# Patient Record
Sex: Male | Born: 1945 | Race: White | Hispanic: No | Marital: Single | Smoking: Never smoker
Health system: Southern US, Community
[De-identification: ages and names within clinical notes are randomized; demographics above are authoritative.]

## PROBLEM LIST (undated history)

## (undated) DIAGNOSIS — B182 Chronic viral hepatitis C: Secondary | ICD-10-CM

## (undated) DIAGNOSIS — F1010 Alcohol abuse, uncomplicated: Secondary | ICD-10-CM

## (undated) DIAGNOSIS — M87051 Idiopathic aseptic necrosis of right femur: Secondary | ICD-10-CM

## (undated) DIAGNOSIS — E440 Moderate protein-calorie malnutrition: Secondary | ICD-10-CM

## (undated) DIAGNOSIS — R112 Nausea with vomiting, unspecified: Secondary | ICD-10-CM

## (undated) DIAGNOSIS — E871 Hypo-osmolality and hyponatremia: Secondary | ICD-10-CM

## (undated) DIAGNOSIS — M6250 Muscle wasting and atrophy, not elsewhere classified, unspecified site: Secondary | ICD-10-CM

## (undated) DIAGNOSIS — T7840XA Allergy, unspecified, initial encounter: Secondary | ICD-10-CM

## (undated) DIAGNOSIS — E559 Vitamin D deficiency, unspecified: Secondary | ICD-10-CM

## (undated) DIAGNOSIS — G4701 Insomnia due to medical condition: Secondary | ICD-10-CM

## (undated) DIAGNOSIS — J45909 Unspecified asthma, uncomplicated: Secondary | ICD-10-CM

## (undated) DIAGNOSIS — M545 Low back pain, unspecified: Secondary | ICD-10-CM

## (undated) DIAGNOSIS — E876 Hypokalemia: Secondary | ICD-10-CM

## (undated) DIAGNOSIS — D500 Iron deficiency anemia secondary to blood loss (chronic): Secondary | ICD-10-CM

## (undated) DIAGNOSIS — B49 Unspecified mycosis: Secondary | ICD-10-CM

## (undated) DIAGNOSIS — Z9889 Other specified postprocedural states: Secondary | ICD-10-CM

## (undated) DIAGNOSIS — F319 Bipolar disorder, unspecified: Secondary | ICD-10-CM

## (undated) DIAGNOSIS — W19XXXD Unspecified fall, subsequent encounter: Secondary | ICD-10-CM

## (undated) DIAGNOSIS — M87052 Idiopathic aseptic necrosis of left femur: Secondary | ICD-10-CM

## (undated) DIAGNOSIS — R1030 Lower abdominal pain, unspecified: Secondary | ICD-10-CM

## (undated) DIAGNOSIS — Z8616 Personal history of COVID-19: Secondary | ICD-10-CM

## (undated) DIAGNOSIS — R451 Restlessness and agitation: Secondary | ICD-10-CM

## (undated) DIAGNOSIS — K429 Umbilical hernia without obstruction or gangrene: Secondary | ICD-10-CM

## (undated) DIAGNOSIS — Z96642 Presence of left artificial hip joint: Secondary | ICD-10-CM

## (undated) DIAGNOSIS — I2510 Atherosclerotic heart disease of native coronary artery without angina pectoris: Secondary | ICD-10-CM

## (undated) DIAGNOSIS — R4586 Emotional lability: Secondary | ICD-10-CM

## (undated) DIAGNOSIS — K219 Gastro-esophageal reflux disease without esophagitis: Secondary | ICD-10-CM

## (undated) DIAGNOSIS — M1990 Unspecified osteoarthritis, unspecified site: Secondary | ICD-10-CM

## (undated) DIAGNOSIS — J189 Pneumonia, unspecified organism: Secondary | ICD-10-CM

## (undated) DIAGNOSIS — R5383 Other fatigue: Secondary | ICD-10-CM

## (undated) DIAGNOSIS — J449 Chronic obstructive pulmonary disease, unspecified: Secondary | ICD-10-CM

## (undated) DIAGNOSIS — F1910 Other psychoactive substance abuse, uncomplicated: Secondary | ICD-10-CM

## (undated) DIAGNOSIS — I10 Essential (primary) hypertension: Secondary | ICD-10-CM

## (undated) DIAGNOSIS — M25559 Pain in unspecified hip: Secondary | ICD-10-CM

## (undated) DIAGNOSIS — N183 Chronic kidney disease, stage 3 unspecified: Secondary | ICD-10-CM

## (undated) DIAGNOSIS — F419 Anxiety disorder, unspecified: Secondary | ICD-10-CM

## (undated) DIAGNOSIS — Z87442 Personal history of urinary calculi: Secondary | ICD-10-CM

## (undated) DIAGNOSIS — F4310 Post-traumatic stress disorder, unspecified: Secondary | ICD-10-CM

## (undated) DIAGNOSIS — E039 Hypothyroidism, unspecified: Secondary | ICD-10-CM

## (undated) DIAGNOSIS — D509 Iron deficiency anemia, unspecified: Secondary | ICD-10-CM

## (undated) DIAGNOSIS — F1410 Cocaine abuse, uncomplicated: Secondary | ICD-10-CM

## (undated) DIAGNOSIS — C801 Malignant (primary) neoplasm, unspecified: Secondary | ICD-10-CM

## (undated) HISTORY — DX: Other specified postprocedural states: Z98.890

## (undated) HISTORY — PX: COLONOSCOPY W/ ENDOSCOPIC US: SHX1379

## (undated) HISTORY — PX: ESOPHAGOGASTRODUODENOSCOPY: SHX1529

---
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
## 2001-03-15 ENCOUNTER — Encounter: Admission: RE | Admit: 2001-03-15 | Discharge: 2001-03-15 | Payer: Self-pay | Admitting: Family Medicine

## 2001-05-22 ENCOUNTER — Encounter: Admission: RE | Admit: 2001-05-22 | Discharge: 2001-05-22 | Payer: Self-pay | Admitting: Family Medicine

## 2002-01-24 ENCOUNTER — Encounter: Admission: RE | Admit: 2002-01-24 | Discharge: 2002-01-24 | Payer: Self-pay | Admitting: Sports Medicine

## 2002-07-04 ENCOUNTER — Encounter: Admission: RE | Admit: 2002-07-04 | Discharge: 2002-07-04 | Payer: Self-pay | Admitting: Sports Medicine

## 2003-02-20 ENCOUNTER — Encounter: Admission: RE | Admit: 2003-02-20 | Discharge: 2003-02-20 | Payer: Self-pay | Admitting: Sports Medicine

## 2003-03-25 ENCOUNTER — Ambulatory Visit (HOSPITAL_COMMUNITY): Admission: RE | Admit: 2003-03-25 | Discharge: 2003-03-25 | Payer: Self-pay | Admitting: Sports Medicine

## 2003-07-01 ENCOUNTER — Encounter: Admission: RE | Admit: 2003-07-01 | Discharge: 2003-07-01 | Payer: Self-pay | Admitting: Sports Medicine

## 2004-02-19 ENCOUNTER — Ambulatory Visit: Payer: Self-pay | Admitting: Sports Medicine

## 2004-10-25 ENCOUNTER — Ambulatory Visit (HOSPITAL_COMMUNITY): Admission: RE | Admit: 2004-10-25 | Discharge: 2004-10-25 | Payer: Self-pay | Admitting: Sports Medicine

## 2004-10-25 ENCOUNTER — Ambulatory Visit: Payer: Self-pay | Admitting: Sports Medicine

## 2004-11-04 ENCOUNTER — Ambulatory Visit (HOSPITAL_COMMUNITY): Admission: RE | Admit: 2004-11-04 | Discharge: 2004-11-04 | Payer: Self-pay | Admitting: Vascular Surgery

## 2005-02-17 ENCOUNTER — Ambulatory Visit: Payer: Self-pay | Admitting: Sports Medicine

## 2005-04-14 ENCOUNTER — Ambulatory Visit: Payer: Self-pay | Admitting: Family Medicine

## 2006-03-02 ENCOUNTER — Ambulatory Visit: Payer: Self-pay | Admitting: Sports Medicine

## 2006-05-26 ENCOUNTER — Ambulatory Visit (HOSPITAL_COMMUNITY): Admission: RE | Admit: 2006-05-26 | Discharge: 2006-05-26 | Payer: Self-pay | Admitting: Sports Medicine

## 2006-09-28 ENCOUNTER — Telehealth: Payer: Self-pay | Admitting: *Deleted

## 2006-10-02 ENCOUNTER — Ambulatory Visit: Payer: Self-pay | Admitting: Family Medicine

## 2006-10-02 ENCOUNTER — Telehealth (INDEPENDENT_AMBULATORY_CARE_PROVIDER_SITE_OTHER): Payer: Self-pay | Admitting: *Deleted

## 2006-10-02 DIAGNOSIS — H6120 Impacted cerumen, unspecified ear: Secondary | ICD-10-CM | POA: Insufficient documentation

## 2006-10-02 DIAGNOSIS — G4700 Insomnia, unspecified: Secondary | ICD-10-CM | POA: Insufficient documentation

## 2007-03-08 ENCOUNTER — Ambulatory Visit: Payer: Self-pay | Admitting: Family Medicine

## 2007-03-08 DIAGNOSIS — M109 Gout, unspecified: Secondary | ICD-10-CM | POA: Insufficient documentation

## 2007-03-08 DIAGNOSIS — R030 Elevated blood-pressure reading, without diagnosis of hypertension: Secondary | ICD-10-CM | POA: Insufficient documentation

## 2007-03-08 DIAGNOSIS — N400 Enlarged prostate without lower urinary tract symptoms: Secondary | ICD-10-CM | POA: Insufficient documentation

## 2007-03-08 LAB — CONVERTED CEMR LAB
AST: 19 units/L (ref 0–37)
Albumin: 4.8 g/dL (ref 3.5–5.2)
BUN: 23 mg/dL (ref 6–23)
CO2: 25 meq/L (ref 19–32)
Calcium: 9.9 mg/dL (ref 8.4–10.5)
Chloride: 103 meq/L (ref 96–112)
Cholesterol: 232 mg/dL — ABNORMAL HIGH (ref 0–200)
Creatinine, Ser: 1.28 mg/dL (ref 0.40–1.50)
Glucose, Bld: 106 mg/dL — ABNORMAL HIGH (ref 70–99)
HDL: 74 mg/dL (ref >39)
Potassium: 4.9 meq/L (ref 3.5–5.3)
Total CHOL/HDL Ratio: 3.1

## 2007-03-12 ENCOUNTER — Encounter: Payer: Self-pay | Admitting: Family Medicine

## 2007-06-18 ENCOUNTER — Encounter: Payer: Self-pay | Admitting: Sports Medicine

## 2007-10-05 ENCOUNTER — Encounter: Payer: Self-pay | Admitting: Sports Medicine

## 2007-10-06 ENCOUNTER — Encounter: Admission: RE | Admit: 2007-10-06 | Discharge: 2007-10-06 | Payer: Self-pay | Admitting: Orthopedic Surgery

## 2008-03-10 ENCOUNTER — Ambulatory Visit: Payer: Self-pay | Admitting: Sports Medicine

## 2008-03-10 ENCOUNTER — Ambulatory Visit: Payer: Self-pay | Admitting: Family Medicine

## 2008-03-10 DIAGNOSIS — T887XXA Unspecified adverse effect of drug or medicament, initial encounter: Secondary | ICD-10-CM | POA: Insufficient documentation

## 2008-03-10 LAB — CONVERTED CEMR LAB
Albumin: 4.7 g/dL (ref 3.5–5.2)
BUN: 23 mg/dL (ref 6–23)
Calcium: 9.3 mg/dL (ref 8.4–10.5)
Chloride: 107 meq/L (ref 96–112)
Creatinine, Ser: 1.21 mg/dL (ref 0.40–1.50)
Glucose, Bld: 94 mg/dL (ref 70–99)
HCT: 41.6 % (ref 39.0–52.0)
Hemoglobin: 13.7 g/dL (ref 13.0–17.0)
MCHC: 32.9 g/dL (ref 30.0–36.0)
PSA: 0.9 ng/mL (ref 0.10–4.00)
Potassium: 4.8 meq/L (ref 3.5–5.3)
RBC: 4.41 M/uL (ref 4.22–5.81)

## 2008-05-23 ENCOUNTER — Ambulatory Visit: Payer: Self-pay | Admitting: Sports Medicine

## 2008-05-23 DIAGNOSIS — M79609 Pain in unspecified limb: Secondary | ICD-10-CM | POA: Insufficient documentation

## 2008-06-11 ENCOUNTER — Encounter: Payer: Self-pay | Admitting: Sports Medicine

## 2008-06-27 ENCOUNTER — Ambulatory Visit: Payer: Self-pay | Admitting: Sports Medicine

## 2008-06-27 ENCOUNTER — Ambulatory Visit: Payer: Self-pay | Admitting: Family Medicine

## 2008-06-27 DIAGNOSIS — M6740 Ganglion, unspecified site: Secondary | ICD-10-CM | POA: Insufficient documentation

## 2008-06-27 LAB — CONVERTED CEMR LAB: Uric Acid, Serum: 9.2 mg/dL — ABNORMAL HIGH (ref 4.0–7.8)

## 2008-12-16 ENCOUNTER — Ambulatory Visit: Payer: Self-pay | Admitting: Sports Medicine

## 2008-12-16 DIAGNOSIS — J209 Acute bronchitis, unspecified: Secondary | ICD-10-CM | POA: Insufficient documentation

## 2008-12-16 DIAGNOSIS — J45901 Unspecified asthma with (acute) exacerbation: Secondary | ICD-10-CM | POA: Insufficient documentation

## 2008-12-24 ENCOUNTER — Ambulatory Visit: Payer: Self-pay | Admitting: Sports Medicine

## 2008-12-24 DIAGNOSIS — K219 Gastro-esophageal reflux disease without esophagitis: Secondary | ICD-10-CM | POA: Insufficient documentation

## 2008-12-30 ENCOUNTER — Ambulatory Visit: Payer: Self-pay | Admitting: Sports Medicine

## 2009-01-20 ENCOUNTER — Encounter: Payer: Self-pay | Admitting: Sports Medicine

## 2009-02-12 ENCOUNTER — Ambulatory Visit: Payer: Self-pay | Admitting: Sports Medicine

## 2009-03-10 ENCOUNTER — Ambulatory Visit: Payer: Self-pay | Admitting: Family Medicine

## 2009-03-10 ENCOUNTER — Ambulatory Visit: Payer: Self-pay | Admitting: Sports Medicine

## 2009-03-10 LAB — CONVERTED CEMR LAB
ALT: 11 units/L (ref 0–53)
AST: 16 units/L (ref 0–37)
Albumin: 4.6 g/dL (ref 3.5–5.2)
Calcium: 9.2 mg/dL (ref 8.4–10.5)
Chloride: 104 meq/L (ref 96–112)
Platelets: 272 10*3/uL (ref 150–400)
Potassium: 4.7 meq/L (ref 3.5–5.3)
RDW: 13.3 % (ref 11.5–15.5)
Sodium: 141 meq/L (ref 135–145)
Total Protein: 6.7 g/dL (ref 6.0–8.3)
WBC: 5.8 10*3/uL (ref 4.0–10.5)

## 2009-03-11 ENCOUNTER — Encounter: Payer: Self-pay | Admitting: Sports Medicine

## 2009-03-11 LAB — CONVERTED CEMR LAB
LDL Cholesterol: 155 mg/dL — ABNORMAL HIGH (ref 0–99)
Triglycerides: 94 mg/dL (ref <150)
VLDL: 19 mg/dL (ref 0–40)

## 2009-04-22 ENCOUNTER — Encounter: Payer: Self-pay | Admitting: Sports Medicine

## 2009-06-08 ENCOUNTER — Encounter: Payer: Self-pay | Admitting: Sports Medicine

## 2009-06-12 ENCOUNTER — Encounter: Payer: Self-pay | Admitting: Sports Medicine

## 2009-08-03 ENCOUNTER — Encounter: Payer: Self-pay | Admitting: Sports Medicine

## 2010-03-04 ENCOUNTER — Encounter: Payer: Self-pay | Admitting: Sports Medicine

## 2010-03-15 ENCOUNTER — Ambulatory Visit: Payer: Self-pay | Admitting: Sports Medicine

## 2010-03-15 ENCOUNTER — Ambulatory Visit: Payer: Self-pay | Admitting: Family Medicine

## 2010-03-15 LAB — CONVERTED CEMR LAB
ALT: 50 units/L (ref 0–53)
AST: 31 units/L (ref 0–37)
Calcium: 9.4 mg/dL (ref 8.4–10.5)
Chloride: 104 meq/L (ref 96–112)
Cholesterol: 213 mg/dL — ABNORMAL HIGH (ref 0–200)
Creatinine, Ser: 1.35 mg/dL (ref 0.40–1.50)
LDL Cholesterol: 129 mg/dL — ABNORMAL HIGH (ref 0–99)
MCHC: 32.4 g/dL (ref 30.0–36.0)
MCV: 98.1 fL (ref 78.0–100.0)
Potassium: 5.3 meq/L (ref 3.5–5.3)
RBC: 4.15 M/uL — ABNORMAL LOW (ref 4.22–5.81)
Sodium: 141 meq/L (ref 135–145)
Total Protein: 6.1 g/dL (ref 6.0–8.3)
VLDL: 19 mg/dL (ref 0–40)

## 2010-04-25 HISTORY — PX: CATARACT EXTRACTION: SUR2

## 2010-05-25 NOTE — Medication Information (Signed)
Summary: Preffered product info  Preffered product info   Imported By: Marily Memos 04/27/2009 10:13:36  _____________________________________________________________________  External Attachment:    Type:   Image     Comment:   External Document

## 2010-05-25 NOTE — Assessment & Plan Note (Signed)
Summary: CPE/LP   Vital Signs:  Patient profile:   65 year old male Height:      69 inches Weight:      159.2 pounds BMI:     23.59 Pulse rate:   85 / minute BP sitting:   118 / 72  (left arm)  Vitals Entered By: Rochele Pages RN (March 15, 2010 10:06 AM) CC: CPE   Primary Provider:  Enid Baas MD  CC:  CPE.  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 Sulfa  Past History:  Past Medical History: Last updated: 03/10/2009 BPH  gout attack in june in 5th toe of left foot none since then  IBS uses phenobarb plus belladonna q 6 hrs as needed - no new sxs and no attacks in past year  sleep pattern controlled xanax but only needs occassionally - has poor sleep 2x per week rarely uses except when traveling, off schedule  no hx of excess snoring  taken off allopurinol for skin side effects  Family History: Reviewed history from 03/10/2009 and no changes required. 2 sisters A and well  and Angelique Blonder is 72, marie 35  brother glenn is 78 with aortic insufficiency and pacemaker  father died of lung ca/ PE in 2000-10-02  mother died of multiple myeloma in 1997/10/02  Social History: Reviewed history from 03/10/2008 and no changes required. president of [employer];  travels extensively;  exercises with cardio and weights 3 to 4 times per wk for 1 hour;  non smoker;  social etoh only  Review of Systems  The patient  denies chest pain, syncope, dyspnea on exertion, prolonged cough, abdominal pain, and melena.    Physical Exam  General:  Well-developed,well-nourished,in no acute distress; alert,appropriate and cooperative throughout examination Head:  Normocephalic and atraumatic without obvious abnormalities. No apparent alopecia or balding. Eyes:  glasses Ears:  External ear exam shows no significant lesions or deformities.  Otoscopic examination reveals clear canals, tympanic membranes are intact bilaterally without bulging, retraction, inflammation or discharge. Hearing is grossly normal bilaterally.  tendency to wax buildup Nose:  External nasal examination shows no deformity or inflammation. Nasal mucosa are pink and moist without lesions or exudates. Mouth:  Oral mucosa and oropharynx without lesions or exudates.  Teeth in good repair. Neck:  No deformities, masses, or tenderness noted.  good ROM Chest Wall:  No deformities, masses, tenderness or gynecomastia noted. Lungs:  Normal respiratory effort, chest expands symmetrically. Lungs are clear to auscultation, no crackles or wheezes. Heart:  Normal rate and regular rhythm. S1 and S2 normal without gallop, murmur, click, rub or other extra sounds. Abdomen:  Bowel sounds positive,abdomen soft and non-tender without masses, organomegaly or hernias noted. Prostate:  per Dr Isabel Caprice Msk:  Good ROM knees, hips and ankles  good low back motion  rotator cuff testing is wnl  degenerative changes of 1st MTP joints bilat but w  less swelling mild DJD changes in hands Pulses:  R and L carotid,radial,femoral,dorsalis pedis and posterior tibial pulses are full and equal bilaterally Extremities:  No clubbing, cyanosis, edema, or deformity noted with normal full range of motion of all joints.   Neurologic:  alert & oriented X3 and cranial nerves II-XII intact.     Impression & Recommendations:  Problem # 1:  PREVENTIVE HEALTH CARE (ICD-V70.0) Today we  will screen standard labs for his medical conditions he appears to be stavble from all chronic probs renewed medications  no changes in recommendations for exercise and will cont meds with some weaning of GI meds  Complete Medication List: 1)  Colchicine 0.6 Mg Tabs (Colchicine) .Marland Kitchen.. 1 tablet by mouth bid 2)  Avodart 0.5 Mg Caps (Dutasteride) .Marland Kitchen.. 1 by mouth every other day 3)  Belladonna Alk-phenobarbital 16.2 Mg Tabs (Belladonna alk-phenobarbital) .Marland Kitchen.. 1 by mouth every other day 4)  Viagra 50 Mg Tabs (Sildenafil citrate) .... As directed 5)  Flomax 0.4 Mg Cp24 (Tamsulosin hcl) .Marland Kitchen.. 1 once daily 6)  Xanax 0.25 Mg Tabs (Alprazolam) .Marland Kitchen.. 1 by mouth tid 7)  Vesicare 5 Mg Tabs (Solifenacin succinate) .Marland Kitchen.. 1 by mouth every other day 8)  Voltaren 1 % Gel (Diclofenac sodium) .... Apply 4grm to affected area qid 9)  Probenecid 500 Mg Tabs (Probenecid) .... 1/2 tab bid 10)  Voltaren 75 Mg Tbec (Diclofenac sodium) .Marland Kitchen.. 1 by mouth bid 11)  Dexilant 60 Mg Cpdr (Dexlansoprazole) .... Take one tab by mouth two times a day 12)  [facility] Acid Reducer Max St 150 Mg Tabs (Ranitidine hcl) .... Use two times a day prn 13)  Colcrys 0.6 Mg Tabs (Colchicine) .... Take 1 by mouth two times a day 14)  Zegrid 40 Mg  .... Take 1 by mouth once daily  Patient Instructions: 1)  Meds 2)  you can consider whether you need belladonna medication any longer since GI stystem has done well 3)  try to wean every third day at first and stop if you see no difference after 1 month 4)  we will check blood levels and let you know results including a blood type 5)  use voltaren gel on hands as well if needed Prescriptions: ZEGRID 40 MG Take 1 by mouth once daily  #30 x 12   Entered by:   Rochele Pages RN   Authorized by:   Enid Baas MD   Signed by:   Rochele Pages RN on 03/15/2010   Method used:   Telephoned to ...       [id number] (retail)       [address]       [place], [place]  [address]       Ph: [phone] or  [phone]       Fax: [phone]   RxID:   [id number] COLCRYS 0.6 MG TABS (COLCHICINE) Take 1 by mouth two times a day  #60 x 12   Entered and Authorized by:   Enid Baas MD   Signed by:   Enid Baas MD on 03/15/2010   Method used:   Electronically to        [facility] (retail)       [address]       [place], [place]  [address]       Ph: [phone] or [phone]       Fax: [phone]   RxID:   [id number] XANAX 0.25 MG TABS (ALPRAZOLAM) 1 by mouth TID  #  90 x 2   Entered by:   Rochele Pages RN   Authorized by:   Enid Baas MD   Signed by:   Rochele Pages RN on 03/15/2010   Method used:   Print then Give to Patient   RxID:   [id number] FLOMAX 0.4 MG CP24 (TAMSULOSIN HCL) 1 once daily  #90 x 3   Entered by:   Rochele Pages RN   Authorized by:   Enid Baas MD   Signed by:   Rochele Pages RN on 03/15/2010   Method used:   Electronically to        [facility] (retail)       [address]       [place], [place]  [address]       Ph: [phone] or [phone]       Fax: [phone]   RxID:   [id number] VOLTAREN 75 MG TBEC (DICLOFENAC SODIUM) 1 by mouth bid  #60 Tablet x 4   Entered by:   Rochele Pages RN   Authorized by:   Enid Baas MD   Signed by:   Rochele Pages RN on 03/15/2010   Method used:   Electronically to        [facility] (retail)       [address]       [place], [place]  [address]       Ph: [phone] or [phone]       Fax: [phone]   RxID:   [id number] VOLTAREN 1 % GEL (DICLOFENAC SODIUM) APPLY 4GRM TO AFFECTED AREA QID  #100GRM x 6   Entered by:   Rochele Pages RN   Authorized by:   Enid Baas MD   Signed by:   Rochele Pages RN on 03/15/2010   Method used:   Electronically to        [facility] (retail)       [address]       [place], [place]  [address]       Ph: [phone] or [phone]       Fax: [phone]   RxID:   [id number] PROBENECID 500 MG TABS (PROBENECID) 1/2 tab bid  #30 Tablet x 4   Entered  by:   Rochele Pages RN   Authorized by:   Enid Baas MD   Signed by:   Rochele Pages RN on 03/15/2010   Method used:   Electronically to        [facility] (retail)       [address]       [place], [place]  [address]       Ph: [phone] or [phone]       Fax: [phone]   RxID:   [id number] BELLADONNA ALK-PHENOBARBITAL 16.2 MG  TABS (BELLADONNA ALK-PHENOBARBITAL) 1 by mouth every other day  #45 x 3   Entered by:   Rochele Pages RN   Authorized by:   Enid Baas MD   Signed by:   Rochele Pages RN on 03/15/2010   Method used:   Electronically to        [facility] (retail)       [address]       [place], [place]  [address]       Ph: [phone] or [phone]       Fax: [phone]   RxID:   [id number] AVODART 0.5 MG CAPS (DUTASTERIDE) 1 by mouth every other day  #45 Capsule x  3   Entered by:   Rochele Pages RN   Authorized by:   Enid Baas MD   Signed by:   Rochele Pages RN on 03/15/2010   Method used:   Electronically to        [facility] (retail)       [address]       [place], [place]  [address]       Ph: [phone] or [phone]       Fax: [phone]   RxID:   [id number]    Orders Added: 1)  Est. Patient 65& > [42595]

## 2010-05-25 NOTE — Consult Note (Signed)
Summary: [facility]   Imported By: Clydell Hakim 06/18/2009 16:20:37  _____________________________________________________________________  External Attachment:    Type:   Image     Comment:   External Document

## 2010-05-25 NOTE — Consult Note (Signed)
Summary: [facility]   Imported By: Marily Memos 06/15/2009 13:44:23  _____________________________________________________________________  External Attachment:    Type:   Image     Comment:   External Document

## 2010-05-25 NOTE — Medication Information (Signed)
Summary: [facility]   Imported By: Marily Memos 03/05/2010 09:59:51  _____________________________________________________________________  External Attachment:    Type:   Image     Comment:   External Document

## 2010-05-25 NOTE — Progress Notes (Signed)
[facility]   Imported By: Marily Memos 09/09/2009 11:56:05  _____________________________________________________________________  External Attachment:    Type:   Image     Comment:   External Document

## 2010-07-23 ENCOUNTER — Telehealth: Payer: Self-pay | Admitting: *Deleted

## 2010-07-23 NOTE — Telephone Encounter (Signed)
Pt requested referral to Derm for "liver spots" on face.  Per Dr. Darrick Penna referred to Dr. Donzetta Starch- appt sched for 09/06/10 at 2:20 pm.  Pt's assistant Alona Bene notified of appt info.

## 2010-08-06 ENCOUNTER — Other Ambulatory Visit: Payer: Self-pay | Admitting: Sports Medicine

## 2010-08-09 ENCOUNTER — Other Ambulatory Visit: Payer: Self-pay | Admitting: Sports Medicine

## 2010-08-11 ENCOUNTER — Encounter: Payer: Self-pay | Admitting: Sports Medicine

## 2010-08-11 ENCOUNTER — Ambulatory Visit (INDEPENDENT_AMBULATORY_CARE_PROVIDER_SITE_OTHER): Payer: 59 | Admitting: Sports Medicine

## 2010-08-11 VITALS — BP 135/87

## 2010-08-11 DIAGNOSIS — G4700 Insomnia, unspecified: Secondary | ICD-10-CM

## 2010-08-11 MED ORDER — CLONAZEPAM 0.5 MG PO TABS: ORAL_TABLET | ORAL | Status: DC

## 2010-08-11 NOTE — Progress Notes (Signed)
  Subjective:    Patient ID: Miguel Williams, male    DOB: 12/21/1945, 65 y.o.   MRN: [id number]  HPI Having problems with sleep Early morning appts - he will wake up and not get back to sleep This is half the problem  Other times sleeps but awakens feeling tired Even on nights he dreams he feels tired when he awakens  No day time sleepiness  Can't sleep on planes  No history of snoring/ no apnea sxs  Has used xanax in past and slept better with half tab but did not want to use regularly Benadryl has used for 10 years and this helps get to sleep  Wine helps some  Her other medical problems are stable at this time. He recently saw the urologist and will be weaning medications slightly. His reflux is under control and Dr. Matthias Hughs will do an endoscopy in about one year. He is being monitored for Barrett's esophagitis.   Review of Systems     Objective:   Physical Exam    NAD  No respiratory difficulty  Alert, oriented, not drwosy    Assessment & Plan:

## 2010-08-11 NOTE — Patient Instructions (Signed)
Trial of Klonopin for insomnia Use 1/2 to 1 tablet nightly Later you may be able to try every other night  First wean benadryl to 1 per night for 1 week Then 1 every other night alternated with klonopin  After 1 week of alternating try just klonopin  Wine is OK  For sleep but watch for reflux sxs  Trial for 2 months and let me know

## 2010-08-11 NOTE — Assessment & Plan Note (Signed)
I do not think he has orthopedic issues or sleep apnea that contribute to his chronic insomnia. I suspect that she nearly taking Benadryl to assist with sleep has made his sleep pattern more dysfunctional. 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  With this in mind we will use a low dose of Klonopin rather than Xanax. I will gradually work with him to see if we can get nonmedical ways to assist with his sleep.  Currently his exercise program diet and alcohol intake are all reasonable and are now part of the problem. We will reassess this after a two-month trial.

## 2010-09-07 ENCOUNTER — Other Ambulatory Visit: Payer: Self-pay | Admitting: Sports Medicine

## 2010-09-08 ENCOUNTER — Other Ambulatory Visit: Payer: Self-pay | Admitting: Sports Medicine

## 2011-01-26 ENCOUNTER — Other Ambulatory Visit: Payer: Self-pay | Admitting: *Deleted

## 2011-01-26 ENCOUNTER — Other Ambulatory Visit: Payer: Self-pay | Admitting: Sports Medicine

## 2011-01-26 MED ORDER — CLONAZEPAM 0.5 MG PO TABS: ORAL_TABLET | ORAL | Status: DC

## 2011-02-11 ENCOUNTER — Encounter: Payer: Self-pay | Admitting: *Deleted

## 2011-02-11 ENCOUNTER — Other Ambulatory Visit: Payer: Self-pay | Admitting: *Deleted

## 2011-02-25 ENCOUNTER — Other Ambulatory Visit: Payer: Self-pay | Admitting: Sports Medicine

## 2011-03-07 ENCOUNTER — Other Ambulatory Visit: Payer: Self-pay | Admitting: *Deleted

## 2011-03-07 DIAGNOSIS — Z0000 Encounter for general adult medical examination without abnormal findings: Secondary | ICD-10-CM

## 2011-03-07 DIAGNOSIS — R030 Elevated blood-pressure reading, without diagnosis of hypertension: Secondary | ICD-10-CM

## 2011-03-08 ENCOUNTER — Other Ambulatory Visit: Payer: Self-pay | Admitting: *Deleted

## 2011-03-08 DIAGNOSIS — R972 Elevated prostate specific antigen [PSA]: Secondary | ICD-10-CM | POA: Insufficient documentation

## 2011-03-08 DIAGNOSIS — N429 Disorder of prostate, unspecified: Secondary | ICD-10-CM

## 2011-03-30 ENCOUNTER — Ambulatory Visit (INDEPENDENT_AMBULATORY_CARE_PROVIDER_SITE_OTHER): Payer: 59 | Admitting: Sports Medicine

## 2011-03-30 ENCOUNTER — Other Ambulatory Visit: Payer: 59

## 2011-03-30 ENCOUNTER — Encounter: Payer: Self-pay | Admitting: Sports Medicine

## 2011-03-30 ENCOUNTER — Other Ambulatory Visit: Payer: Self-pay | Admitting: Sports Medicine

## 2011-03-30 VITALS — BP 129/83 | HR 71 | Ht 68.0 in | Wt 163.6 lb

## 2011-03-30 DIAGNOSIS — R030 Elevated blood-pressure reading, without diagnosis of hypertension: Secondary | ICD-10-CM

## 2011-03-30 DIAGNOSIS — Z0000 Encounter for general adult medical examination without abnormal findings: Secondary | ICD-10-CM

## 2011-03-30 DIAGNOSIS — N429 Disorder of prostate, unspecified: Secondary | ICD-10-CM

## 2011-03-30 LAB — COMPREHENSIVE METABOLIC PANEL
ALT: 41 U/L (ref 0–53)
Alkaline Phosphatase: 50 U/L (ref 39–117)
CO2: 28 mEq/L (ref 19–32)
Sodium: 142 mEq/L (ref 135–145)
Total Bilirubin: 0.5 mg/dL (ref 0.3–1.2)
Total Protein: 6.3 g/dL (ref 6.0–8.3)

## 2011-03-30 LAB — CBC
MCH: 32.5 pg (ref 26.0–34.0)
MCV: 98.3 fL (ref 78.0–100.0)
Platelets: 213 10*3/uL (ref 150–400)
RDW: 14.1 % (ref 11.5–15.5)

## 2011-03-30 LAB — CHOLESTEROL, TOTAL: Cholesterol: 227 mg/dL — ABNORMAL HIGH (ref 0–200)

## 2011-03-30 NOTE — Progress Notes (Signed)
LABS DONE TODAY Darlis Wragg 

## 2011-03-30 NOTE — Progress Notes (Signed)
  Subjective:    Patient ID: Miguel Williams, male    DOB: April 19, 1946, 65 y.o.   MRN: [id number]  HPI  Pt presents to clinic for yearly physical today.  Gout- controlled with colcrys 2 tabs bid.  No gout attack since starting the medication.  GERD- No current problems.  Controlled well with dexilant AM and zegrid PM.  Does have occasional hoarseness.    Has Barrett's esophagus, seeing Dr. Matthias Hughs in Jan 2013- he recommends a f/u endoscopy at that time.  Had small stomach polyp in 09/19/09 that was benign.   Has right hand pain, which he attributes to arthritis- worse in the mornings, takes diclofenac bid. This does help the pain. However Voltaren gel is helping pain over his first MTPs which were inflamed with gout so I think he should try this on his hands as well.  Had cataract surgery on both eyes this fall.  Now only needs glasses for reading fine print and driving.   Reviewed history from 03/15/2010 and no changes required.  2 sisters A and well and Angelique Blonder is 48, marie 16  brother glenn is 4 with aortic insufficiency and pacemaker  father died of lung ca/ PE in Sep 19, 2000  mother died of multiple myeloma in 1997/09/19  Reviewed history from 03/15/2010 and no changes required.  president of [employer];  travels extensively;  exercises with cardio and weights 3 to 4 times per wk for 1 hour;  non smoker;  social etoh only     Review of Systems    no history of chest pain, shortness of breath or excessive dizziness. No problems with GI bleeding. No persistent cough. No recent infections. Objective:   Physical Exam No acute distress Normocephalic with glasses Geographic hairy tongue / no other lesions noted on oral exam Ears: Normal Eyes: Both eyes looks clear with lenses in place No carotid bruits  Heart: regular rate and rhythm  Lungs: clear Abdomen: No hepatosplenomegally; nontender to palpation and no masses.  MSK: Hip ROM normal Knee exam normal bilat Straight leg raise 70  deg lt, 75 deg on rt Shoulders- rotator cuff normal bilat, back straight Neck - full ROM Lt great toe 1st MTP hypertrophy Moderate limitation of flexion of great toe     mild hypertrophy of the right first MTP Normal back examination  Skin without any suspicious lesions     Rt great toe- less hypertrophy than on lt 2-3 toes partially fused.       Assessment & Plan:

## 2011-03-30 NOTE — Patient Instructions (Addendum)
Reduce diclofenac to once in the mornings, and every other night, if your hand pain does not get worse after 3 weeks please discontinue diclofenac at night.  Try this regimen for 1 month in the mornings only, if no increase in joint pain try every other day for 1 month, if no increase in joint pain at this time, please discontinue Diclofenac.   Ask Dr. Matthias Hughs about the geographic tongue changes- whether this is a sign of reflux symptoms.  Continue all other medications.    Thank you for seeing Miguel Williams today!

## 2011-03-30 NOTE — Assessment & Plan Note (Signed)
Today we reviewed all of his chronic medical illnesses.  #1 concern would be that he followup carefully for his reflux and Barrett's esophagitis.  I see changes on his tongue that may be irritation from reflux and he also has some periodic hoarseness but never as severe as before he started treatment.  With this in mind I would like to reduce his use of diclofenac orally and try to treat his gout with topical medicines and the colchicine.  Laboratories are pending from today. I will send a copy of these to Dr. Matthias Hughs and Dr. Isabel Caprice

## 2011-03-31 ENCOUNTER — Other Ambulatory Visit: Payer: Self-pay | Admitting: *Deleted

## 2011-03-31 MED ORDER — DUTASTERIDE 0.5 MG PO CAPS: 0.5000 mg | ORAL_CAPSULE | ORAL | Status: DC

## 2011-03-31 MED ORDER — CLONAZEPAM 0.5 MG PO TABS: ORAL_TABLET | ORAL | Status: DC

## 2011-03-31 MED ORDER — DICLOFENAC SODIUM 75 MG PO TBEC: 75.0000 mg | DELAYED_RELEASE_TABLET | Freq: Two times a day (BID) | ORAL | Status: DC

## 2011-03-31 MED ORDER — DICLOFENAC SODIUM 1 % TD GEL: 1.0000 "application " | Freq: Four times a day (QID) | TRANSDERMAL | Status: DC | PRN

## 2011-03-31 MED ORDER — COLCHICINE 0.6 MG PO TABS: 1.2000 mg | ORAL_TABLET | Freq: Two times a day (BID) | ORAL | Status: DC

## 2011-03-31 MED ORDER — ALPRAZOLAM 0.25 MG PO TABS: 0.2500 mg | ORAL_TABLET | Freq: Three times a day (TID) | ORAL | Status: DC

## 2011-03-31 MED ORDER — TAMSULOSIN HCL 0.4 MG PO CAPS: 0.4000 mg | ORAL_CAPSULE | Freq: Every day | ORAL | Status: DC

## 2011-03-31 MED ORDER — SILDENAFIL CITRATE 50 MG PO TABS: 50.0000 mg | ORAL_TABLET | ORAL | Status: DC | PRN

## 2011-03-31 MED ORDER — BELLADONNA ALK-PHENOBARBITAL 16.2 MG PO TABS: 1.0000 | ORAL_TABLET | ORAL | Status: DC

## 2011-03-31 MED ORDER — PROBENECID 500 MG PO TABS: 500.0000 mg | ORAL_TABLET | Freq: Every day | ORAL | Status: DC

## 2011-03-31 MED ORDER — SOLIFENACIN SUCCINATE 5 MG PO TABS: 5.0000 mg | ORAL_TABLET | Freq: Every day | ORAL | Status: DC

## 2011-04-02 ENCOUNTER — Other Ambulatory Visit: Payer: Self-pay | Admitting: Sports Medicine

## 2011-04-04 ENCOUNTER — Other Ambulatory Visit: Payer: Self-pay | Admitting: *Deleted

## 2011-04-04 MED ORDER — OMEPRAZOLE-SODIUM BICARBONATE 40-1100 MG PO CAPS: 1.0000 | ORAL_CAPSULE | Freq: Every day | ORAL | Status: DC

## 2011-04-04 MED ORDER — DEXLANSOPRAZOLE 60 MG PO CPDR: 60.0000 mg | DELAYED_RELEASE_CAPSULE | Freq: Two times a day (BID) | ORAL | Status: DC

## 2011-04-07 ENCOUNTER — Ambulatory Visit (INDEPENDENT_AMBULATORY_CARE_PROVIDER_SITE_OTHER): Payer: 59 | Admitting: *Deleted

## 2011-04-07 ENCOUNTER — Other Ambulatory Visit: Payer: Self-pay | Admitting: *Deleted

## 2011-04-07 DIAGNOSIS — Z23 Encounter for immunization: Secondary | ICD-10-CM

## 2011-04-07 MED ORDER — ZOSTER VACCINE LIVE 19400 UNT/0.65ML ~~LOC~~ SOLR: 0.6500 mL | Freq: Once | SUBCUTANEOUS | Status: AC

## 2011-04-07 MED ORDER — ZOSTER VACCINE LIVE 19400 UNT/0.65ML ~~LOC~~ SOLR: 0.6500 mL | Freq: Once | SUBCUTANEOUS | Status: DC

## 2011-07-14 ENCOUNTER — Encounter: Payer: Self-pay | Admitting: Sports Medicine

## 2011-08-05 ENCOUNTER — Other Ambulatory Visit: Payer: Self-pay | Admitting: *Deleted

## 2011-08-05 MED ORDER — CLONAZEPAM 0.5 MG PO TABS: ORAL_TABLET | ORAL | Status: DC

## 2011-10-28 ENCOUNTER — Other Ambulatory Visit: Payer: Self-pay | Admitting: Sports Medicine

## 2012-02-24 ENCOUNTER — Telehealth: Payer: Self-pay | Admitting: *Deleted

## 2012-02-24 DIAGNOSIS — R030 Elevated blood-pressure reading, without diagnosis of hypertension: Secondary | ICD-10-CM

## 2012-02-24 DIAGNOSIS — N429 Disorder of prostate, unspecified: Secondary | ICD-10-CM

## 2012-02-24 DIAGNOSIS — Z0000 Encounter for general adult medical examination without abnormal findings: Secondary | ICD-10-CM

## 2012-02-24 NOTE — Telephone Encounter (Signed)
Message copied by Mora Bellman on Fri Feb 24, 2012  2:07 PM ------      Message from: Enid Baas      Created: Fri Feb 24, 2012  1:08 PM      Regarding: RE: question       I will go ahead and see Daundre.      Let Amy review so we can be sure if we know which labs he has had and we can order the new ones.                  KBF      ----- Message -----         From: Lizbeth Bark         Sent: 02/23/2012   3:37 PM           To: Enid Baas, MD      Subject: question                                                 Pt scheduled CPE with you  Dec 19th, I think you were ok with still seeing him for this. Right?  If so please put in order lab, he will be going to lab before this appt.      Thank you!

## 2012-03-01 ENCOUNTER — Other Ambulatory Visit: Payer: Self-pay | Admitting: Sports Medicine

## 2012-03-20 ENCOUNTER — Other Ambulatory Visit: Payer: Self-pay | Admitting: *Deleted

## 2012-03-20 MED ORDER — CLONAZEPAM 0.5 MG PO TABS: 0.5000 mg | ORAL_TABLET | Freq: Every evening | ORAL | Status: DC | PRN

## 2012-03-20 NOTE — Progress Notes (Signed)
Refilled per Dr. Fields  

## 2012-04-04 ENCOUNTER — Encounter: Payer: Self-pay | Admitting: *Deleted

## 2012-04-04 ENCOUNTER — Other Ambulatory Visit: Payer: Self-pay | Admitting: Sports Medicine

## 2012-04-04 ENCOUNTER — Other Ambulatory Visit: Payer: Self-pay | Admitting: *Deleted

## 2012-04-04 MED ORDER — BELLADONNA ALK-PHENOBARBITAL 16.2 MG PO TABS: ORAL_TABLET | ORAL | Status: DC

## 2012-04-12 ENCOUNTER — Encounter: Payer: Self-pay | Admitting: Sports Medicine

## 2012-04-12 ENCOUNTER — Other Ambulatory Visit: Payer: 59

## 2012-04-12 ENCOUNTER — Ambulatory Visit (INDEPENDENT_AMBULATORY_CARE_PROVIDER_SITE_OTHER): Payer: 59 | Admitting: Sports Medicine

## 2012-04-12 VITALS — BP 134/78 | HR 73 | Ht 68.5 in | Wt 156.0 lb

## 2012-04-12 DIAGNOSIS — R030 Elevated blood-pressure reading, without diagnosis of hypertension: Secondary | ICD-10-CM

## 2012-04-12 DIAGNOSIS — Z0000 Encounter for general adult medical examination without abnormal findings: Secondary | ICD-10-CM

## 2012-04-12 DIAGNOSIS — N429 Disorder of prostate, unspecified: Secondary | ICD-10-CM

## 2012-04-12 LAB — CBC
HCT: 37.8 % — ABNORMAL LOW (ref 39.0–52.0)
Hemoglobin: 12.6 g/dL — ABNORMAL LOW (ref 13.0–17.0)
MCHC: 33.3 g/dL (ref 30.0–36.0)
MCV: 97.2 fL (ref 78.0–100.0)
RDW: 13.5 % (ref 11.5–15.5)
WBC: 3.8 10*3/uL — ABNORMAL LOW (ref 4.0–10.5)

## 2012-04-12 LAB — COMPREHENSIVE METABOLIC PANEL
ALT: 33 U/L (ref 0–53)
AST: 27 U/L (ref 0–37)
BUN: 35 mg/dL — ABNORMAL HIGH (ref 6–23)
Creat: 1.39 mg/dL — ABNORMAL HIGH (ref 0.50–1.35)
Total Bilirubin: 0.4 mg/dL (ref 0.3–1.2)

## 2012-04-12 LAB — LIPID PANEL
HDL: 64 mg/dL (ref >39)
LDL Cholesterol: 97 mg/dL (ref 0–99)
Total CHOL/HDL Ratio: 2.9 Ratio
Triglycerides: 127 mg/dL (ref <150)
VLDL: 25 mg/dL (ref 0–40)

## 2012-04-12 NOTE — Assessment & Plan Note (Signed)
The patient's chronic problems are stable at this time. We did not change any of his medications. I did suggest that he could try some low dose of Benadryl gargle for the irritation that he gets with his braces.  We will contact him after his laboratory screening with any abnormalities.  During the coming year he like to do an exercise tolerance test as he does continue on a fairly vigorous exercise program

## 2012-04-12 NOTE — Progress Notes (Signed)
Patient ID: Miguel Williams, male   DOB: 05-08-1945, 66 y.o.   MRN: [id number]  Gout - no attacks in past year  Colcrys works well at 1 or 2 per day/  Takes Voltaren 1 daily Uses gel over  First MTP  REFLUX Voice is better Not getting voice fatigue at end of day Still uses Dexilant in morning and Zegrid at night (OTC) Barrett's is fine at present and next endoscipy is in 2 years  BPH Still using Avodart Not using Viagra  Insomnia and leg cramps Using Klonopin and that helps some  Braces waken him at 4 AM and can't sleep Uses some advil for braces Still has 24 weeks to use braces  Asthma/ Bronchitis No recent attacks and really inactive  Immunizations Varicella last year Pneumonia last year Flu shot this year and yearly  Surgeries Tonsillectomy Cataracts last year  Exercise Lifting qod Walking more Has elliptical machine and plans to use Some tennis  Examination  No acute distress Vital signs unremarkable  ENT examination reveals some wax occlusion in both ears but otherwise within normal limits No carotid bruits or thyromegaly Chest clear Coronary is regular and without murmur or gallop Abdominal exam is unremarkable with no hepatosplenomegaly  Neck examination reveals full range of motion Shoulder examination reveals full range of motion no impingement signs and good strength throughout Hip range of motion is normal with about 80-90 internal rotation He has good strength in abduction and hip flexion  Knee examination within normal limits Ankle examination reveals some minor changes with thickening of the capsule Foot examination reveals some deformity at the MTP joint of both feet There is limited motion of the great toe bilaterally  Skin examination - unremarkable

## 2012-04-12 NOTE — Progress Notes (Signed)
CMP,CBC,FLP AND PSA DONE TODAY Miguel Williams 

## 2012-04-16 ENCOUNTER — Telehealth: Payer: Self-pay | Admitting: *Deleted

## 2012-04-16 NOTE — Telephone Encounter (Signed)
Spoke with pt- gave lab results per Dr. Darrick Penna- Advised pt dr Darrick Penna wants to recheck labs in 6 weeks as there were some values that were elevated.

## 2012-04-26 ENCOUNTER — Encounter: Payer: Self-pay | Admitting: *Deleted

## 2012-04-26 DIAGNOSIS — Z0000 Encounter for general adult medical examination without abnormal findings: Secondary | ICD-10-CM

## 2012-04-26 DIAGNOSIS — R030 Elevated blood-pressure reading, without diagnosis of hypertension: Secondary | ICD-10-CM

## 2012-05-16 ENCOUNTER — Other Ambulatory Visit: Payer: 59

## 2012-05-16 DIAGNOSIS — R030 Elevated blood-pressure reading, without diagnosis of hypertension: Secondary | ICD-10-CM

## 2012-05-16 DIAGNOSIS — Z0000 Encounter for general adult medical examination without abnormal findings: Secondary | ICD-10-CM

## 2012-05-16 LAB — COMPREHENSIVE METABOLIC PANEL
ALT: 23 U/L (ref 0–53)
BUN: 34 mg/dL — ABNORMAL HIGH (ref 6–23)
CO2: 27 mEq/L (ref 19–32)
Calcium: 9.5 mg/dL (ref 8.4–10.5)
Chloride: 107 mEq/L (ref 96–112)
Creat: 1.17 mg/dL (ref 0.50–1.35)
Total Bilirubin: 0.8 mg/dL (ref 0.3–1.2)

## 2012-05-16 LAB — CBC
HCT: 40.5 % (ref 39.0–52.0)
MCH: 32.5 pg (ref 26.0–34.0)
MCV: 96.7 fL (ref 78.0–100.0)
Platelets: 243 10*3/uL (ref 150–400)
RBC: 4.19 MIL/uL — ABNORMAL LOW (ref 4.22–5.81)

## 2012-05-16 NOTE — Progress Notes (Signed)
CBC AND CMP DONE TODAY Miguel Williams

## 2012-05-24 ENCOUNTER — Encounter: Payer: Self-pay | Admitting: *Deleted

## 2012-05-24 DIAGNOSIS — R739 Hyperglycemia, unspecified: Secondary | ICD-10-CM

## 2012-05-24 NOTE — Progress Notes (Signed)
Patient ID: Miguel Williams, male   DOB: 07-02-1945, 67 y.o.   MRN: [id number]   Per Dr. Darrick Penna- advised pt to have hgb A1c drawn in 1 month for further evaluation of elevated fasting blood glucose.  Pt agreeable.

## 2012-06-04 ENCOUNTER — Encounter: Payer: Self-pay | Admitting: *Deleted

## 2012-06-20 ENCOUNTER — Other Ambulatory Visit (INDEPENDENT_AMBULATORY_CARE_PROVIDER_SITE_OTHER): Payer: 59

## 2012-06-20 DIAGNOSIS — R739 Hyperglycemia, unspecified: Secondary | ICD-10-CM

## 2012-06-20 DIAGNOSIS — R7309 Other abnormal glucose: Secondary | ICD-10-CM

## 2012-06-20 NOTE — Progress Notes (Signed)
A1c 4.9. °

## 2012-06-27 ENCOUNTER — Telehealth: Payer: Self-pay | Admitting: *Deleted

## 2012-06-27 NOTE — Telephone Encounter (Signed)
Spoke with pt- gave him A1c results.

## 2012-06-27 NOTE — Telephone Encounter (Signed)
Message copied by Mora Bellman on Wed Jun 27, 2012  4:46 PM ------      Message from: Enid Baas      Created: Wed Jun 27, 2012  4:43 PM       Let his office know that the DM screening test was normal.  We may wish to repeat in 1 year      ----- Message -----         From: Bonnie [place]         Sent: 06/20/2012   6:26 PM           To: Enid Baas, MD                   ------

## 2012-06-29 ENCOUNTER — Other Ambulatory Visit: Payer: Self-pay | Admitting: Sports Medicine

## 2012-08-13 ENCOUNTER — Encounter: Payer: Self-pay | Admitting: *Deleted

## 2012-09-17 ENCOUNTER — Other Ambulatory Visit: Payer: Self-pay | Admitting: Sports Medicine

## 2012-09-18 NOTE — Telephone Encounter (Signed)
Refilled per Dr. Fields  

## 2012-12-25 ENCOUNTER — Other Ambulatory Visit: Payer: Self-pay | Admitting: Sports Medicine

## 2012-12-27 ENCOUNTER — Other Ambulatory Visit: Payer: Self-pay | Admitting: *Deleted

## 2012-12-27 ENCOUNTER — Other Ambulatory Visit: Payer: Self-pay | Admitting: Sports Medicine

## 2012-12-27 MED ORDER — BELLADONNA ALK-PHENOBARBITAL 16.2 MG PO TABS: ORAL_TABLET | ORAL | Status: DC

## 2012-12-27 MED ORDER — DUTASTERIDE 0.5 MG PO CAPS: 0.5000 mg | ORAL_CAPSULE | ORAL | Status: DC

## 2012-12-27 NOTE — Progress Notes (Signed)
Advised pt- per Dr. Darrick Penna he will do 1 more refill of the requested meds.  Pt needs to establish care with new PCP.

## 2012-12-31 ENCOUNTER — Other Ambulatory Visit: Payer: Self-pay | Admitting: Sports Medicine

## 2013-01-01 ENCOUNTER — Ambulatory Visit (INDEPENDENT_AMBULATORY_CARE_PROVIDER_SITE_OTHER): Payer: 59 | Admitting: Diagnostic Neuroimaging

## 2013-01-01 ENCOUNTER — Encounter: Payer: Self-pay | Admitting: Diagnostic Neuroimaging

## 2013-01-01 VITALS — BP 133/82 | HR 93 | Ht 69.25 in | Wt 153.0 lb

## 2013-01-01 DIAGNOSIS — K0889 Other specified disorders of teeth and supporting structures: Secondary | ICD-10-CM

## 2013-01-01 DIAGNOSIS — K089 Disorder of teeth and supporting structures, unspecified: Secondary | ICD-10-CM

## 2013-01-01 MED ORDER — CARBAMAZEPINE ER 200 MG PO CP12: 200.0000 mg | ORAL_CAPSULE | Freq: Two times a day (BID) | ORAL | Status: DC

## 2013-01-01 NOTE — Progress Notes (Signed)
[facility]  PATIENT: Rashawn Rayman DOB: Jul 08, 1945  REFERRING CLINICIAN: [place]  HISTORY FROM: patient REASON FOR VISIT: new consult   HISTORICAL  CHIEF COMPLAINT:  Chief Complaint  Patient presents with  . facial discomfort    HISTORY OF PRESENT ILLNESS:   67 year old male here for evaluation of dental pain.  Around 2006, patient was noted to have changes in his teeth related to grinding. He was prescribed a night guard by his dentist. He wore this intermittently. By 2013, his dentist and orthodontist recommended dental procedures to correct this. Started with Invisalign braces in Oct 2013. Immediately upon using the straight he felt severe pain in his teeth. Pain was quite severe especially when taking the trays out and putting them back in. Therefore patient left the trays and most of the day and stopped eating. He lost 20 pounds during the six-month process. By April 2014 patient had completed his treatment and for approximately 3 weeks was pain free.  May 2014 patient had additional dental work done in preparation for placement of crowns and veneers. This resulted in additional pain. By June 2014 he had 20 crowns and veneers placed. Following this he developed severe pain and pressure sensation in his teeth. He has a baseline aching and soreness in all of his teeth now. In addition when he chews food or gum this triggers a pressure sensation in between his teeth as though food is stuck there. Drinking liquids and biting down on a rubber bite guard does not trigger the pain. Symptoms may last hours at a time. He has learned to alleviate this pain by swishing a combination of salt and aspirin.  Patient referred to me for further evaluation.  No symptoms in his jaw or face. No vision symptoms. No problems in his extremities. He has insomnia, urination problems, weight loss which is improving.  REVIEW OF SYSTEMS: Full 14 system review of systems performed and  notable only for as per HPI.  ALLERGIES: Allergies  Allergen Reactions  . Sulfonamide Derivatives     HOME MEDICATIONS: Prior to Admission medications   Medication Sig Start Date End Date Taking? Authorizing Provider  belladonna alk-PHENObarbital (DONNATAL) 16.2 MG tablet Take one tab every other day 12/27/12  Yes Enid Baas, MD  clonazePAM (KLONOPIN) 0.5 MG tablet TAKE 1 TABLET BY MOUTH AT BEDTIME AS NEEDED FOR ANXIETY 09/17/12  Yes Enid Baas, MD  COLCRYS 0.6 MG tablet TAKE 2 TABLETS (1.2 MG TOTAL) BY MOUTH 2 (TWO) TIMES DAILY. 04/04/12  Yes Enid Baas, MD  DEXILANT 60 MG capsule TAKE 1 CAPSULE (60 MG TOTAL) BY MOUTH 2 (TWO) TIMES DAILY. 04/04/12  Yes Enid Baas, MD  diclofenac sodium (VOLTAREN) 1 % GEL Apply 1 application topically 4 (four) times daily as needed. 03/31/11  Yes Enid Baas, MD  dutasteride (AVODART) 0.5 MG capsule Take 1 capsule (0.5 mg total) by mouth every other day. 12/27/12  Yes Enid Baas, MD  ibuprofen (ADVIL,MOTRIN) 800 MG tablet  12/27/12  Yes Historical Provider, MD  omeprazole-sodium bicarbonate (ZEGERID) 40-1100 MG per capsule Take 1 capsule by mouth daily. 04/04/11 01/01/13 Yes Enid Baas, MD  probenecid (BENEMID) 500 MG tablet TAKE 1 TABLET (500 MG TOTAL) BY MOUTH DAILY. 06/29/12  Yes Enid Baas, MD  Tamsulosin HCl (FLOMAX) 0.4 MG CAPS TAKE 1 CAPSULE (0.4 MG TOTAL) BY MOUTH DAILY. 04/04/12  Yes Enid Baas, MD  VESICARE 5 MG tablet TAKE 1 TABLET (5 MG TOTAL) BY MOUTH DAILY. 04/04/12  Yes Enid Baas, MD  carbamazepine (EQUETRO) 200 MG CP12 12 hr capsule Take 1 capsule (200 mg total) by mouth 2 (two) times daily. 01/01/13   Suanne Marker, MD   Outpatient Prescriptions Prior to Visit  Medication Sig Dispense Refill  . belladonna alk-PHENObarbital (DONNATAL) 16.2 MG tablet Take one tab every other day  30 tablet  0  . clonazePAM (KLONOPIN) 0.5 MG tablet TAKE 1 TABLET BY MOUTH AT BEDTIME AS NEEDED FOR ANXIETY  60 tablet  4  . COLCRYS 0.6 MG tablet TAKE 2 TABLETS  (1.2 MG TOTAL) BY MOUTH 2 (TWO) TIMES DAILY.  120 tablet  5  . DEXILANT 60 MG capsule TAKE 1 CAPSULE (60 MG TOTAL) BY MOUTH 2 (TWO) TIMES DAILY.  60 capsule  5  . diclofenac sodium (VOLTAREN) 1 % GEL Apply 1 application topically 4 (four) times daily as needed.  60 g  6  . dutasteride (AVODART) 0.5 MG capsule Take 1 capsule (0.5 mg total) by mouth every other day.  30 capsule  0  . omeprazole-sodium bicarbonate (ZEGERID) 40-1100 MG per capsule Take 1 capsule by mouth daily.  30 capsule  6  . probenecid (BENEMID) 500 MG tablet TAKE 1 TABLET (500 MG TOTAL) BY MOUTH DAILY.  30 tablet  6  . Tamsulosin HCl (FLOMAX) 0.4 MG CAPS TAKE 1 CAPSULE (0.4 MG TOTAL) BY MOUTH DAILY.  30 capsule  6  . VESICARE 5 MG tablet TAKE 1 TABLET (5 MG TOTAL) BY MOUTH DAILY.  30 tablet  6  . diclofenac (VOLTAREN) 75 MG EC tablet TAKE 1 TABLET (75 MG TOTAL) BY MOUTH 2 (TWO) TIMES DAILY.  60 tablet  5  . ALPRAZolam (XANAX) 0.25 MG tablet Take 1 tablet (0.25 mg total) by mouth 3 (three) times daily.  30 tablet  3  . sildenafil (VIAGRA) 50 MG tablet Take 1 tablet (50 mg total) by mouth as needed. As directed.  10 tablet  6   No facility-administered medications prior to visit.    PAST MEDICAL HISTORY: Past Medical History  Diagnosis Date  . History of endoscopy 02/00/2011    PAST SURGICAL HISTORY: Past Surgical History  Procedure Laterality Date  . Tonsillectomy  1952  . Cataract extraction  2012  . Colonoscopy w/ endoscopic [place]      FAMILY HISTORY: Family History  Problem Relation Age of Onset  . Lung cancer Father     SOCIAL HISTORY:  History   Social History  . Marital Status: Single    Spouse Name: N/A    Number of Children: 0  . Years of Education: college   Occupational History  .  [employer]   Social History Main Topics  . Smoking status: Never Smoker   . Smokeless tobacco: Never Used  . Alcohol Use: No  . Drug Use: No  . Sexual Activity: Yes    Partners: Female   Other Topics  Concern  . Not on file   Social History Narrative  . No narrative on file     PHYSICAL EXAM  Filed Vitals:   01/01/13 0928  BP: 133/82  Pulse: 93  Height: 5' 9.25" (1.759 m)  Weight: 153 lb (69.4 kg)    Not recorded    Body mass index is 22.43 kg/(m^2).  GENERAL EXAM: Patient is in no distress; TONGUE DISCOLORATION (BLACK/BROWN)  CARDIOVASCULAR: Regular rate and rhythm, no murmurs, no carotid bruits  NEUROLOGIC: MENTAL STATUS: awake, alert, language fluent, comprehension intact, naming intact CRANIAL NERVE: no papilledema on fundoscopic exam, pupils equal  and reactive to light, visual fields full to confrontation, extraocular muscles intact, no nystagmus, facial sensation and strength symmetric, uvula midline, shoulder shrug symmetric, tongue midline. MOTOR: normal bulk and tone, full strength in the BUE, BLE SENSORY: normal and symmetric to light touch, pinprick, temperature, vibration COORDINATION: finger-nose-finger, fine finger movements normal REFLEXES: deep tendon reflexes present and symmetric GAIT/STATION: narrow based gait; able to walk on toes, heels and tandem; romberg is negative   DIAGNOSTIC DATA (LABS, IMAGING, TESTING) - I reviewed patient records, labs, notes, testing and imaging myself where available.  Lab Results  Component Value Date   WBC 5.3 05/16/2012   HGB 13.6 05/16/2012   HCT 40.5 05/16/2012   MCV 96.7 05/16/2012   PLT 243 05/16/2012      Component Value Date/Time   NA 141 05/16/2012 1021   K 4.5 05/16/2012 1021   CL 107 05/16/2012 1021   CO2 27 05/16/2012 1021   GLUCOSE 124* 05/16/2012 1021   BUN 34* 05/16/2012 1021   CREATININE 1.17 05/16/2012 1021   CREATININE 1.35 03/15/2010 2102   CALCIUM 9.5 05/16/2012 1021   PROT 6.2 05/16/2012 1021   ALBUMIN 4.4 05/16/2012 1021   AST 21 05/16/2012 1021   ALT 23 05/16/2012 1021   ALKPHOS 47 05/16/2012 1021   BILITOT 0.8 05/16/2012 1021   Lab Results  Component Value Date   CHOL 186 04/12/2012   HDL  64 04/12/2012   LDLCALC 97 04/12/2012   TRIG 127 04/12/2012   CHOLHDL 2.9 04/12/2012   Lab Results  Component Value Date   HGBA1C 4.9 06/20/2012   No results found for this basename: VITAMINB12   No results found for this basename: TSH      ASSESSMENT AND PLAN  67 y.o. year old male here with post-procedure dental pain and pressure. No clear neurologic diagnosis. Will try empiric trial of carbamazepine to see  If this improves his pain/symptoms. This does not fit with typical trigeminal neuralgia.  Dx: atypical dental pain, post-procedure  Meds ordered this encounter  Medications  . ibuprofen (ADVIL,MOTRIN) 800 MG tablet    Sig:   . carbamazepine (EQUETRO) 200 MG CP12 12 hr capsule    Sig: Take 1 capsule (200 mg total) by mouth 2 (two) times daily.    Dispense:  60 capsule    Refill:  12   Return in about 3 months (around 04/02/2013) for with Heide Guile or Deaisha Welborn.    Suanne Marker, MD 01/01/2013, 10:22 AM Certified in Neurology, Neurophysiology and Neuroimaging  [facility] [address], [address] [place], [place] [address] [phone]

## 2013-01-01 NOTE — Patient Instructions (Signed)
Try carbamazepine 200mg  at bedtime x 2 weeks; then increase to twice a day.

## 2013-01-17 ENCOUNTER — Telehealth: Payer: Self-pay | Admitting: Diagnostic Neuroimaging

## 2013-01-17 MED ORDER — GABAPENTIN 300 MG PO CAPS: 300.0000 mg | ORAL_CAPSULE | Freq: Three times a day (TID) | ORAL | Status: DC

## 2013-01-17 NOTE — Telephone Encounter (Signed)
Called pt and he stated tegretrol helping with pain.  On the right track.  He stated that when increased the dose he came down wth hives and sepnt 5 hours in ED .  He will not be back until 02-12-13 when I stated about appt.   Would really appreciate trying something else.  [facility] [place].  Please he states.

## 2013-01-17 NOTE — Telephone Encounter (Signed)
I called patient. Was doing well on CBZ daily dose (some improvement in symptoms), then went to BID and developed hives/rash. Went to urgent care then ER. Now off med.  Will wait 2 weeks, then try gabapentin. Rx [facility].   Suanne Marker, MD 01/17/2013, 2:52 PM Certified in Neurology, Neurophysiology and Neuroimaging  [facility] [address] [place], [place] [address] [phone]

## 2013-01-18 NOTE — Telephone Encounter (Signed)
Spoke to patient. He was requesting to know when should he expect hives to go away. Advised patient could vary, would have to run its course. Patient agreed.

## 2013-01-31 ENCOUNTER — Telehealth: Payer: Self-pay | Admitting: Diagnostic Neuroimaging

## 2013-02-04 MED ORDER — GABAPENTIN 300 MG PO CAPS: 300.0000 mg | ORAL_CAPSULE | Freq: Three times a day (TID) | ORAL | Status: DC

## 2013-02-04 NOTE — Telephone Encounter (Signed)
Pt called and is asking about taking the gabapentin as prescribed on the bottle vs being told something different.   I told him that I would take as prescribed by Dr. Marjory Lies.  Gabapentin 300mg  po tid.  Dr. Marjory Lies consulted about the # caps (may be 90 with 5 refills when calls next time).   Done.

## 2013-02-12 ENCOUNTER — Other Ambulatory Visit: Payer: Self-pay | Admitting: Sports Medicine

## 2013-02-12 ENCOUNTER — Other Ambulatory Visit: Payer: Self-pay | Admitting: *Deleted

## 2013-02-12 MED ORDER — PROBENECID 500 MG PO TABS: 500.0000 mg | ORAL_TABLET | Freq: Every day | ORAL | Status: DC

## 2013-02-12 MED ORDER — DUTASTERIDE 0.5 MG PO CAPS: 0.5000 mg | ORAL_CAPSULE | ORAL | Status: AC

## 2013-02-18 DIAGNOSIS — M109 Gout, unspecified: Secondary | ICD-10-CM | POA: Insufficient documentation

## 2013-02-18 DIAGNOSIS — K649 Unspecified hemorrhoids: Secondary | ICD-10-CM | POA: Insufficient documentation

## 2013-02-18 DIAGNOSIS — Z8619 Personal history of other infectious and parasitic diseases: Secondary | ICD-10-CM | POA: Insufficient documentation

## 2013-03-14 ENCOUNTER — Other Ambulatory Visit: Payer: Self-pay | Admitting: Sports Medicine

## 2013-04-02 ENCOUNTER — Ambulatory Visit (INDEPENDENT_AMBULATORY_CARE_PROVIDER_SITE_OTHER): Payer: 59 | Admitting: Nurse Practitioner

## 2013-04-02 ENCOUNTER — Encounter: Payer: Self-pay | Admitting: Nurse Practitioner

## 2013-04-02 VITALS — BP 110/73 | HR 83 | Temp 98.2°F | Ht 68.5 in | Wt 163.0 lb

## 2013-04-02 DIAGNOSIS — K089 Disorder of teeth and supporting structures, unspecified: Secondary | ICD-10-CM

## 2013-04-02 DIAGNOSIS — K0889 Other specified disorders of teeth and supporting structures: Secondary | ICD-10-CM

## 2013-04-02 NOTE — Progress Notes (Signed)
PATIENT: Miguel Williams DOB: Sep 07, 1945   REASON FOR VISIT: follow up for dental pain HISTORY FROM: patient  HISTORY OF PRESENT ILLNESS: 67 year old male here for evaluation of dental pain.  Around 2006, patient was noted to have changes in his teeth related to grinding. He was prescribed a night guard by his dentist. He wore this intermittently. By 2013, his dentist and orthodontist recommended dental procedures to correct this. Started with Invisalign braces in Oct 2013. Immediately upon using the straight he felt severe pain in his teeth. Pain was quite severe especially when taking the trays out and putting them back in. Therefore patient left the trays in most of the day and stopped eating. He lost 20 pounds during the six-month process. By April 2014 patient had completed his treatment and for approximately 3 weeks was pain free.  May 2014 patient had additional dental work done in preparation for placement of crowns and veneers. This resulted in additional pain. By June 2014 he had 20 crowns and veneers placed. Following this he developed severe pain and pressure sensation in his teeth. He has a baseline aching and soreness in all of his teeth now. In addition when he chews food or gum this triggers a pressure sensation in between his teeth as though food is stuck there. Drinking liquids and biting down on a rubber bite guard does not trigger the pain. Symptoms may last hours at a time. He has learned to alleviate this pain by swishing a combination of salt and aspirin.  Patient referred to me for further evaluation.  No symptoms in his jaw or face. No vision symptoms. No problems in his extremities. He has insomnia, urination problems, weight loss which is improving.   04/02/13 (LL):  [name] returns for follow up.  He had an allergic reaction to Carbamazepine and was switched to Gabapentin.  The Gabapentin 300 mg TID has been no benefit.  His discomfort is unchanged since last visit.   He was referred to an oral surgeon, Dr. Wendall Mola at [facility], who is suggesting a circumferential ligamentotomy on Thursday.  REVIEW OF SYSTEMS: Full 14 system review of systems performed and notable only for as per HPI.  ALLERGIES: Allergies  Allergen Reactions  . Sulfonamide Derivatives   . Tegretol [Carbamazepine] Hives    HOME MEDICATIONS: Outpatient Prescriptions Prior to Visit  Medication Sig Dispense Refill  . belladonna alk-PHENObarbital (DONNATAL) 16.2 MG tablet Take one tab every other day  30 tablet  0  . clonazePAM (KLONOPIN) 0.5 MG tablet TAKE 1 TABLET BY MOUTH AT BEDTIME AS NEEDED FOR ANXIETY  60 tablet  4  . COLCRYS 0.6 MG tablet TAKE 2 TABLETS (1.2 MG TOTAL) BY MOUTH 2 (TWO) TIMES DAILY.  120 tablet  5  . DEXILANT 60 MG capsule TAKE 1 CAPSULE (60 MG TOTAL) BY MOUTH 2 (TWO) TIMES DAILY.  60 capsule  5  . dutasteride (AVODART) 0.5 MG capsule Take 1 capsule (0.5 mg total) by mouth every other day.  30 capsule  0  . gabapentin (NEURONTIN) 300 MG capsule Take 1 capsule (300 mg total) by mouth 3 (three) times daily.  90 capsule  5  . ibuprofen (ADVIL,MOTRIN) 800 MG tablet       . probenecid (BENEMID) 500 MG tablet Take 1 tablet (500 mg total) by mouth daily.  30 tablet  1  . Tamsulosin HCl (FLOMAX) 0.4 MG CAPS TAKE 1 CAPSULE (0.4 MG TOTAL) BY MOUTH DAILY.  30 capsule  6  .  VESICARE 5 MG tablet TAKE 1 TABLET (5 MG TOTAL) BY MOUTH DAILY.  30 tablet  6  . diclofenac sodium (VOLTAREN) 1 % GEL Apply 1 application topically 4 (four) times daily as needed.  60 g  6  . omeprazole-sodium bicarbonate (ZEGERID) 40-1100 MG per capsule Take 1 capsule by mouth daily.  30 capsule  6  . naproxen sodium (ANAPROX) 550 MG tablet    Sig: Take 550 mg by mouth 2 (two) times daily.  Marland Kitchen VIAGRA 100 MG tablet    Sig: Take 100 mg by mouth as needed.  . triamcinolone cream (KENALOG) 0.1 %    Sig: Apply 0.1 application topically daily.   PAST MEDICAL HISTORY: Past Medical History    Diagnosis Date  . History of endoscopy 02/00/2011    PAST SURGICAL HISTORY: Past Surgical History  Procedure Laterality Date  . Tonsillectomy  1952  . Cataract extraction  2012  . Colonoscopy w/ endoscopic [place]      FAMILY HISTORY: Family History  Problem Relation Age of Onset  . Lung cancer Father     SOCIAL HISTORY: History   Social History  . Marital Status: Single    Spouse Name: N/A    Number of Children: 0  . Years of Education: college   Occupational History  .  [employer]   Social History Main Topics  . Smoking status: Never Smoker   . Smokeless tobacco: Never Used  . Alcohol Use: Yes  . Drug Use: No  . Sexual Activity: No   Other Topics Concern  . Not on file   Social History Narrative  . No narrative on file     PHYSICAL EXAM  Filed Vitals:   04/02/13 1101  BP: 110/73  Pulse: 83  Temp: 98.2 F (36.8 C)  TempSrc: Oral  Height: 5' 8.5" (1.74 m)  Weight: 163 lb (73.936 kg)   Body mass index is 24.42 kg/(m^2).  Generalized: Well developed, in no acute distress, TONGUE DISCOLORATION (BLACK/BROWN)  Head: normocephalic and atraumatic. Oropharynx benign  Neck: Supple, no carotid bruits  Cardiac: Regular rate rhythm, no murmur  Musculoskeletal: No deformity   Neurological examination   MENTAL STATUS: awake, alert, language fluent, comprehension intact, naming intact  CRANIAL NERVE: pupils equal and reactive to light, visual fields full to confrontation, extraocular muscles intact, no nystagmus, facial sensation and strength symmetric, uvula midline, shoulder shrug symmetric, tongue midline.  MOTOR: normal bulk and tone, full strength in the BUE, BLE  SENSORY: normal and symmetric to light touch, pinprick, temperature, vibration  COORDINATION: finger-nose-finger, fine finger movements normal  REFLEXES: deep tendon reflexes present and symmetric  GAIT/STATION: narrow based gait; able to walk on toes, heels and tandem; romberg is  negative  DIAGNOSTIC DATA (LABS, IMAGING, TESTING) - I reviewed patient records, labs, notes, testing and imaging myself where available.  Lab Results  Component Value Date   WBC 5.3 05/16/2012   HGB 13.6 05/16/2012   HCT 40.5 05/16/2012   MCV 96.7 05/16/2012   PLT 243 05/16/2012      Component Value Date/Time   NA 141 05/16/2012 1021   K 4.5 05/16/2012 1021   CL 107 05/16/2012 1021   CO2 27 05/16/2012 1021   GLUCOSE 124* 05/16/2012 1021   BUN 34* 05/16/2012 1021   CREATININE 1.17 05/16/2012 1021   CREATININE 1.35 03/15/2010 2102   CALCIUM 9.5 05/16/2012 1021   PROT 6.2 05/16/2012 1021   ALBUMIN 4.4 05/16/2012 1021   AST 21 05/16/2012 1021  ALT 23 05/16/2012 1021   ALKPHOS 47 05/16/2012 1021   BILITOT 0.8 05/16/2012 1021   Lab Results  Component Value Date   CHOL 186 04/12/2012   HDL 64 04/12/2012   LDLCALC 97 04/12/2012   TRIG 127 04/12/2012   CHOLHDL 2.9 04/12/2012   Lab Results  Component Value Date   HGBA1C 4.9 06/20/2012    ASSESSMENT AND PLAN 67 y.o. year old male here with post-procedure dental pain and pressure. No clear neurologic diagnosis. He had allergic reaction to carbamazepine, and trial of Gabapentin did not improve his pain/symptoms. This does not fit with typical trigeminal neuralgia. He was referred to an oral surgeon, Dr. Wendall Mola at [facility], who is suggesting a circumferential ligamentotomy on Thursday, with an anticipated 70% chance of relief of pain.  If after one month post-procedure he still has pain, he is instructed to call office to increase Gabapentin dose to 600 mg TID. Dx: atypical dental pain, post-procedure   Return if symptoms worsen or fail to improve.    Ronal Fear, MSN, NP-C 04/02/2013, 12:07 PM [facility] [address] [place], [place] [address] [phone]  Note: This document was prepared with digital dictation and possible smart phrase technology. Any transcriptional errors that result from this  process are unintentional.  Plan of care was discussed with Vikram R. Penumalli, MD, who also discussed concerns with patient and answered questions during this visit.

## 2013-04-02 NOTE — Patient Instructions (Addendum)
You may increase the Gabapentin dose to 600 mg if the dental surgery does not work.  Follow up only as needed.

## 2013-04-03 DIAGNOSIS — E785 Hyperlipidemia, unspecified: Secondary | ICD-10-CM | POA: Insufficient documentation

## 2013-09-09 DIAGNOSIS — Z79899 Other long term (current) drug therapy: Secondary | ICD-10-CM | POA: Insufficient documentation

## 2014-07-28 DIAGNOSIS — D7589 Other specified diseases of blood and blood-forming organs: Secondary | ICD-10-CM | POA: Insufficient documentation

## 2015-06-04 DIAGNOSIS — H9192 Unspecified hearing loss, left ear: Secondary | ICD-10-CM | POA: Insufficient documentation

## 2015-09-09 DIAGNOSIS — N401 Enlarged prostate with lower urinary tract symptoms: Secondary | ICD-10-CM | POA: Diagnosis not present

## 2015-09-09 DIAGNOSIS — N138 Other obstructive and reflux uropathy: Secondary | ICD-10-CM | POA: Diagnosis not present

## 2015-09-09 DIAGNOSIS — Z0000 Encounter for general adult medical examination without abnormal findings: Secondary | ICD-10-CM | POA: Diagnosis not present

## 2015-10-01 DIAGNOSIS — S80812A Abrasion, left lower leg, initial encounter: Secondary | ICD-10-CM | POA: Diagnosis not present

## 2015-10-01 DIAGNOSIS — R600 Localized edema: Secondary | ICD-10-CM | POA: Diagnosis not present

## 2015-10-28 DIAGNOSIS — H5212 Myopia, left eye: Secondary | ICD-10-CM | POA: Diagnosis not present

## 2015-10-28 DIAGNOSIS — H52221 Regular astigmatism, right eye: Secondary | ICD-10-CM | POA: Diagnosis not present

## 2015-10-28 DIAGNOSIS — H5201 Hypermetropia, right eye: Secondary | ICD-10-CM | POA: Diagnosis not present

## 2015-10-28 DIAGNOSIS — H524 Presbyopia: Secondary | ICD-10-CM | POA: Diagnosis not present

## 2015-12-18 DIAGNOSIS — R251 Tremor, unspecified: Secondary | ICD-10-CM | POA: Diagnosis not present

## 2015-12-18 DIAGNOSIS — R2689 Other abnormalities of gait and mobility: Secondary | ICD-10-CM | POA: Diagnosis not present

## 2015-12-18 DIAGNOSIS — Z23 Encounter for immunization: Secondary | ICD-10-CM | POA: Diagnosis not present

## 2015-12-18 DIAGNOSIS — H9193 Unspecified hearing loss, bilateral: Secondary | ICD-10-CM | POA: Diagnosis not present

## 2015-12-31 DIAGNOSIS — S63283A Dislocation of proximal interphalangeal joint of left middle finger, initial encounter: Secondary | ICD-10-CM | POA: Diagnosis not present

## 2015-12-31 DIAGNOSIS — M79645 Pain in left finger(s): Secondary | ICD-10-CM | POA: Diagnosis not present

## 2015-12-31 DIAGNOSIS — M25542 Pain in joints of left hand: Secondary | ICD-10-CM | POA: Diagnosis not present

## 2016-01-07 DIAGNOSIS — R0602 Shortness of breath: Secondary | ICD-10-CM | POA: Diagnosis not present

## 2016-01-07 DIAGNOSIS — K859 Acute pancreatitis without necrosis or infection, unspecified: Secondary | ICD-10-CM | POA: Diagnosis not present

## 2016-01-07 DIAGNOSIS — K2270 Barrett's esophagus without dysplasia: Secondary | ICD-10-CM | POA: Diagnosis not present

## 2016-01-07 DIAGNOSIS — I4519 Other right bundle-branch block: Secondary | ICD-10-CM | POA: Diagnosis not present

## 2016-01-07 DIAGNOSIS — R0789 Other chest pain: Secondary | ICD-10-CM | POA: Diagnosis not present

## 2016-01-07 DIAGNOSIS — R079 Chest pain, unspecified: Secondary | ICD-10-CM | POA: Diagnosis not present

## 2016-01-07 DIAGNOSIS — R1011 Right upper quadrant pain: Secondary | ICD-10-CM | POA: Diagnosis not present

## 2016-01-08 DIAGNOSIS — R079 Chest pain, unspecified: Secondary | ICD-10-CM | POA: Diagnosis not present

## 2016-01-08 DIAGNOSIS — R1011 Right upper quadrant pain: Secondary | ICD-10-CM | POA: Diagnosis not present

## 2016-01-15 DIAGNOSIS — R251 Tremor, unspecified: Secondary | ICD-10-CM | POA: Diagnosis not present

## 2016-01-15 DIAGNOSIS — M542 Cervicalgia: Secondary | ICD-10-CM | POA: Diagnosis not present

## 2016-01-15 DIAGNOSIS — R030 Elevated blood-pressure reading, without diagnosis of hypertension: Secondary | ICD-10-CM | POA: Diagnosis not present

## 2016-01-15 DIAGNOSIS — K859 Acute pancreatitis without necrosis or infection, unspecified: Secondary | ICD-10-CM | POA: Diagnosis not present

## 2016-01-19 DIAGNOSIS — K858 Other acute pancreatitis without necrosis or infection: Secondary | ICD-10-CM | POA: Diagnosis not present

## 2016-02-02 DIAGNOSIS — K859 Acute pancreatitis without necrosis or infection, unspecified: Secondary | ICD-10-CM | POA: Diagnosis not present

## 2016-02-02 DIAGNOSIS — Z09 Encounter for follow-up examination after completed treatment for conditions other than malignant neoplasm: Secondary | ICD-10-CM | POA: Diagnosis not present

## 2016-02-02 DIAGNOSIS — R160 Hepatomegaly, not elsewhere classified: Secondary | ICD-10-CM | POA: Diagnosis not present

## 2016-02-15 DIAGNOSIS — I700 Atherosclerosis of aorta: Secondary | ICD-10-CM | POA: Insufficient documentation

## 2016-05-05 DIAGNOSIS — H04123 Dry eye syndrome of bilateral lacrimal glands: Secondary | ICD-10-CM | POA: Diagnosis not present

## 2016-05-25 DIAGNOSIS — H9113 Presbycusis, bilateral: Secondary | ICD-10-CM | POA: Diagnosis not present

## 2016-05-25 DIAGNOSIS — H918X2 Other specified hearing loss, left ear: Secondary | ICD-10-CM | POA: Diagnosis not present

## 2016-05-27 DIAGNOSIS — H9113 Presbycusis, bilateral: Secondary | ICD-10-CM | POA: Insufficient documentation

## 2016-05-30 DIAGNOSIS — M109 Gout, unspecified: Secondary | ICD-10-CM | POA: Diagnosis not present

## 2016-05-30 DIAGNOSIS — R7301 Impaired fasting glucose: Secondary | ICD-10-CM | POA: Diagnosis not present

## 2016-05-30 DIAGNOSIS — R8299 Other abnormal findings in urine: Secondary | ICD-10-CM | POA: Diagnosis not present

## 2016-05-30 DIAGNOSIS — D539 Nutritional anemia, unspecified: Secondary | ICD-10-CM | POA: Diagnosis not present

## 2016-05-30 DIAGNOSIS — Z125 Encounter for screening for malignant neoplasm of prostate: Secondary | ICD-10-CM | POA: Diagnosis not present

## 2016-05-30 DIAGNOSIS — E784 Other hyperlipidemia: Secondary | ICD-10-CM | POA: Diagnosis not present

## 2016-06-06 DIAGNOSIS — M5136 Other intervertebral disc degeneration, lumbar region: Secondary | ICD-10-CM | POA: Diagnosis not present

## 2016-06-06 DIAGNOSIS — M542 Cervicalgia: Secondary | ICD-10-CM | POA: Diagnosis not present

## 2016-06-06 DIAGNOSIS — Z0000 Encounter for general adult medical examination without abnormal findings: Secondary | ICD-10-CM | POA: Diagnosis not present

## 2016-06-06 DIAGNOSIS — R946 Abnormal results of thyroid function studies: Secondary | ICD-10-CM | POA: Diagnosis not present

## 2016-06-06 DIAGNOSIS — Z1389 Encounter for screening for other disorder: Secondary | ICD-10-CM | POA: Diagnosis not present

## 2016-06-06 DIAGNOSIS — I700 Atherosclerosis of aorta: Secondary | ICD-10-CM | POA: Diagnosis not present

## 2016-06-06 DIAGNOSIS — K5790 Diverticulosis of intestine, part unspecified, without perforation or abscess without bleeding: Secondary | ICD-10-CM | POA: Diagnosis not present

## 2016-06-08 DIAGNOSIS — Z1212 Encounter for screening for malignant neoplasm of rectum: Secondary | ICD-10-CM | POA: Diagnosis not present

## 2016-07-11 DIAGNOSIS — H903 Sensorineural hearing loss, bilateral: Secondary | ICD-10-CM | POA: Diagnosis not present

## 2016-07-19 DIAGNOSIS — L821 Other seborrheic keratosis: Secondary | ICD-10-CM | POA: Diagnosis not present

## 2016-08-22 DIAGNOSIS — Z8719 Personal history of other diseases of the digestive system: Secondary | ICD-10-CM | POA: Diagnosis not present

## 2016-08-22 DIAGNOSIS — J387 Other diseases of larynx: Secondary | ICD-10-CM | POA: Diagnosis not present

## 2016-08-22 DIAGNOSIS — K2270 Barrett's esophagus without dysplasia: Secondary | ICD-10-CM | POA: Diagnosis not present

## 2016-08-30 DIAGNOSIS — N401 Enlarged prostate with lower urinary tract symptoms: Secondary | ICD-10-CM | POA: Diagnosis not present

## 2016-08-30 DIAGNOSIS — R351 Nocturia: Secondary | ICD-10-CM | POA: Diagnosis not present

## 2016-09-07 DIAGNOSIS — H40013 Open angle with borderline findings, low risk, bilateral: Secondary | ICD-10-CM | POA: Diagnosis not present

## 2016-09-07 DIAGNOSIS — Z961 Presence of intraocular lens: Secondary | ICD-10-CM | POA: Diagnosis not present

## 2016-09-07 DIAGNOSIS — H04123 Dry eye syndrome of bilateral lacrimal glands: Secondary | ICD-10-CM | POA: Diagnosis not present

## 2016-09-28 DIAGNOSIS — K429 Umbilical hernia without obstruction or gangrene: Secondary | ICD-10-CM | POA: Diagnosis not present

## 2016-09-28 DIAGNOSIS — K769 Liver disease, unspecified: Secondary | ICD-10-CM | POA: Diagnosis not present

## 2016-09-28 DIAGNOSIS — M5135 Other intervertebral disc degeneration, thoracolumbar region: Secondary | ICD-10-CM | POA: Diagnosis not present

## 2016-09-28 DIAGNOSIS — K5730 Diverticulosis of large intestine without perforation or abscess without bleeding: Secondary | ICD-10-CM | POA: Diagnosis not present

## 2016-09-28 DIAGNOSIS — K5790 Diverticulosis of intestine, part unspecified, without perforation or abscess without bleeding: Secondary | ICD-10-CM | POA: Diagnosis not present

## 2016-09-28 DIAGNOSIS — K7689 Other specified diseases of liver: Secondary | ICD-10-CM | POA: Diagnosis not present

## 2016-09-28 DIAGNOSIS — I700 Atherosclerosis of aorta: Secondary | ICD-10-CM | POA: Diagnosis not present

## 2016-09-30 DIAGNOSIS — I10 Essential (primary) hypertension: Secondary | ICD-10-CM | POA: Diagnosis not present

## 2016-09-30 DIAGNOSIS — D376 Neoplasm of uncertain behavior of liver, gallbladder and bile ducts: Secondary | ICD-10-CM | POA: Diagnosis not present

## 2017-02-23 DIAGNOSIS — Z23 Encounter for immunization: Secondary | ICD-10-CM | POA: Diagnosis not present

## 2017-02-28 DIAGNOSIS — J387 Other diseases of larynx: Secondary | ICD-10-CM | POA: Diagnosis not present

## 2017-02-28 DIAGNOSIS — K2270 Barrett's esophagus without dysplasia: Secondary | ICD-10-CM | POA: Diagnosis not present

## 2017-05-08 DIAGNOSIS — K317 Polyp of stomach and duodenum: Secondary | ICD-10-CM | POA: Diagnosis not present

## 2017-05-08 DIAGNOSIS — K219 Gastro-esophageal reflux disease without esophagitis: Secondary | ICD-10-CM | POA: Diagnosis not present

## 2017-05-08 DIAGNOSIS — K2270 Barrett's esophagus without dysplasia: Secondary | ICD-10-CM | POA: Diagnosis not present

## 2017-05-08 DIAGNOSIS — K210 Gastro-esophageal reflux disease with esophagitis: Secondary | ICD-10-CM | POA: Diagnosis not present

## 2017-05-11 DIAGNOSIS — K219 Gastro-esophageal reflux disease without esophagitis: Secondary | ICD-10-CM | POA: Diagnosis not present

## 2017-06-05 DIAGNOSIS — D538 Other specified nutritional anemias: Secondary | ICD-10-CM | POA: Diagnosis not present

## 2017-06-05 DIAGNOSIS — R946 Abnormal results of thyroid function studies: Secondary | ICD-10-CM | POA: Diagnosis not present

## 2017-06-05 DIAGNOSIS — Z125 Encounter for screening for malignant neoplasm of prostate: Secondary | ICD-10-CM | POA: Diagnosis not present

## 2017-06-05 DIAGNOSIS — R7301 Impaired fasting glucose: Secondary | ICD-10-CM | POA: Diagnosis not present

## 2017-06-05 DIAGNOSIS — Z0000 Encounter for general adult medical examination without abnormal findings: Secondary | ICD-10-CM | POA: Diagnosis not present

## 2017-06-05 DIAGNOSIS — M109 Gout, unspecified: Secondary | ICD-10-CM | POA: Diagnosis not present

## 2017-06-12 DIAGNOSIS — Z0000 Encounter for general adult medical examination without abnormal findings: Secondary | ICD-10-CM | POA: Diagnosis not present

## 2017-06-12 DIAGNOSIS — R946 Abnormal results of thyroid function studies: Secondary | ICD-10-CM | POA: Diagnosis not present

## 2017-06-12 DIAGNOSIS — Z1389 Encounter for screening for other disorder: Secondary | ICD-10-CM | POA: Diagnosis not present

## 2017-06-12 DIAGNOSIS — N183 Chronic kidney disease, stage 3 (moderate): Secondary | ICD-10-CM | POA: Diagnosis not present

## 2017-06-12 DIAGNOSIS — J45998 Other asthma: Secondary | ICD-10-CM | POA: Diagnosis not present

## 2017-06-12 DIAGNOSIS — R7301 Impaired fasting glucose: Secondary | ICD-10-CM | POA: Diagnosis not present

## 2017-06-12 DIAGNOSIS — M5136 Other intervertebral disc degeneration, lumbar region: Secondary | ICD-10-CM | POA: Diagnosis not present

## 2017-06-15 DIAGNOSIS — Z1212 Encounter for screening for malignant neoplasm of rectum: Secondary | ICD-10-CM | POA: Diagnosis not present

## 2017-08-14 DIAGNOSIS — E038 Other specified hypothyroidism: Secondary | ICD-10-CM | POA: Diagnosis not present

## 2017-08-24 DIAGNOSIS — L430 Hypertrophic lichen planus: Secondary | ICD-10-CM | POA: Diagnosis not present

## 2017-08-24 DIAGNOSIS — L820 Inflamed seborrheic keratosis: Secondary | ICD-10-CM | POA: Diagnosis not present

## 2017-08-24 DIAGNOSIS — L821 Other seborrheic keratosis: Secondary | ICD-10-CM | POA: Diagnosis not present

## 2017-09-07 DIAGNOSIS — H04123 Dry eye syndrome of bilateral lacrimal glands: Secondary | ICD-10-CM | POA: Diagnosis not present

## 2017-09-07 DIAGNOSIS — H40013 Open angle with borderline findings, low risk, bilateral: Secondary | ICD-10-CM | POA: Diagnosis not present

## 2017-09-07 DIAGNOSIS — Z961 Presence of intraocular lens: Secondary | ICD-10-CM | POA: Diagnosis not present

## 2017-12-19 DIAGNOSIS — E038 Other specified hypothyroidism: Secondary | ICD-10-CM | POA: Diagnosis not present

## 2017-12-19 DIAGNOSIS — Z23 Encounter for immunization: Secondary | ICD-10-CM | POA: Diagnosis not present

## 2018-05-07 DIAGNOSIS — J387 Other diseases of larynx: Secondary | ICD-10-CM | POA: Diagnosis not present

## 2018-05-07 DIAGNOSIS — K2270 Barrett's esophagus without dysplasia: Secondary | ICD-10-CM | POA: Diagnosis not present

## 2018-05-07 DIAGNOSIS — K219 Gastro-esophageal reflux disease without esophagitis: Secondary | ICD-10-CM | POA: Diagnosis not present

## 2018-06-14 DIAGNOSIS — K5730 Diverticulosis of large intestine without perforation or abscess without bleeding: Secondary | ICD-10-CM | POA: Diagnosis not present

## 2018-06-14 DIAGNOSIS — Z1211 Encounter for screening for malignant neoplasm of colon: Secondary | ICD-10-CM | POA: Diagnosis not present

## 2018-06-14 DIAGNOSIS — Z86010 Personal history of colonic polyps: Secondary | ICD-10-CM | POA: Diagnosis not present

## 2018-08-16 DIAGNOSIS — D539 Nutritional anemia, unspecified: Secondary | ICD-10-CM | POA: Diagnosis not present

## 2018-08-16 DIAGNOSIS — E7849 Other hyperlipidemia: Secondary | ICD-10-CM | POA: Diagnosis not present

## 2018-08-16 DIAGNOSIS — E038 Other specified hypothyroidism: Secondary | ICD-10-CM | POA: Diagnosis not present

## 2018-08-16 DIAGNOSIS — R7301 Impaired fasting glucose: Secondary | ICD-10-CM | POA: Diagnosis not present

## 2018-08-16 DIAGNOSIS — M109 Gout, unspecified: Secondary | ICD-10-CM | POA: Diagnosis not present

## 2018-08-16 DIAGNOSIS — Z125 Encounter for screening for malignant neoplasm of prostate: Secondary | ICD-10-CM | POA: Diagnosis not present

## 2018-08-17 DIAGNOSIS — R82998 Other abnormal findings in urine: Secondary | ICD-10-CM | POA: Diagnosis not present

## 2018-08-22 DIAGNOSIS — M5136 Other intervertebral disc degeneration, lumbar region: Secondary | ICD-10-CM | POA: Diagnosis not present

## 2018-08-22 DIAGNOSIS — Z1331 Encounter for screening for depression: Secondary | ICD-10-CM | POA: Diagnosis not present

## 2018-08-22 DIAGNOSIS — J45909 Unspecified asthma, uncomplicated: Secondary | ICD-10-CM | POA: Diagnosis not present

## 2018-08-22 DIAGNOSIS — E039 Hypothyroidism, unspecified: Secondary | ICD-10-CM | POA: Diagnosis not present

## 2018-08-22 DIAGNOSIS — K5790 Diverticulosis of intestine, part unspecified, without perforation or abscess without bleeding: Secondary | ICD-10-CM | POA: Diagnosis not present

## 2018-08-22 DIAGNOSIS — Z0000 Encounter for general adult medical examination without abnormal findings: Secondary | ICD-10-CM | POA: Diagnosis not present

## 2018-09-19 DIAGNOSIS — Z79899 Other long term (current) drug therapy: Secondary | ICD-10-CM | POA: Diagnosis not present

## 2018-09-19 DIAGNOSIS — E291 Testicular hypofunction: Secondary | ICD-10-CM | POA: Diagnosis not present

## 2018-09-21 DIAGNOSIS — E291 Testicular hypofunction: Secondary | ICD-10-CM | POA: Insufficient documentation

## 2018-11-07 DIAGNOSIS — H04123 Dry eye syndrome of bilateral lacrimal glands: Secondary | ICD-10-CM | POA: Diagnosis not present

## 2018-11-07 DIAGNOSIS — Z961 Presence of intraocular lens: Secondary | ICD-10-CM | POA: Diagnosis not present

## 2018-11-07 DIAGNOSIS — H40013 Open angle with borderline findings, low risk, bilateral: Secondary | ICD-10-CM | POA: Diagnosis not present

## 2018-11-08 DIAGNOSIS — E291 Testicular hypofunction: Secondary | ICD-10-CM | POA: Diagnosis not present

## 2018-11-08 DIAGNOSIS — E038 Other specified hypothyroidism: Secondary | ICD-10-CM | POA: Diagnosis not present

## 2019-02-27 DIAGNOSIS — L819 Disorder of pigmentation, unspecified: Secondary | ICD-10-CM | POA: Diagnosis not present

## 2019-02-27 DIAGNOSIS — L820 Inflamed seborrheic keratosis: Secondary | ICD-10-CM | POA: Diagnosis not present

## 2019-02-27 DIAGNOSIS — L821 Other seborrheic keratosis: Secondary | ICD-10-CM | POA: Diagnosis not present

## 2019-02-28 DIAGNOSIS — Z23 Encounter for immunization: Secondary | ICD-10-CM | POA: Diagnosis not present

## 2019-02-28 DIAGNOSIS — E038 Other specified hypothyroidism: Secondary | ICD-10-CM | POA: Diagnosis not present

## 2019-04-05 ENCOUNTER — Encounter (HOSPITAL_COMMUNITY): Payer: Self-pay | Admitting: Emergency Medicine

## 2019-04-05 ENCOUNTER — Emergency Department (HOSPITAL_COMMUNITY): Payer: Medicare Other

## 2019-04-05 ENCOUNTER — Inpatient Hospital Stay (HOSPITAL_COMMUNITY)
Admission: EM | Admit: 2019-04-05 | Discharge: 2019-04-07 | DRG: 690 | Disposition: A | Discharge status: Home / Self Care | Payer: Medicare Other | Attending: Internal Medicine | Admitting: Internal Medicine

## 2019-04-05 ENCOUNTER — Other Ambulatory Visit: Payer: Self-pay

## 2019-04-05 DIAGNOSIS — N136 Pyonephrosis: Principal | ICD-10-CM | POA: Diagnosis present

## 2019-04-05 DIAGNOSIS — N201 Calculus of ureter: Secondary | ICD-10-CM | POA: Diagnosis present

## 2019-04-05 DIAGNOSIS — E875 Hyperkalemia: Secondary | ICD-10-CM | POA: Diagnosis present

## 2019-04-05 DIAGNOSIS — N12 Tubulo-interstitial nephritis, not specified as acute or chronic: Secondary | ICD-10-CM | POA: Diagnosis present

## 2019-04-05 DIAGNOSIS — Z9849 Cataract extraction status, unspecified eye: Secondary | ICD-10-CM

## 2019-04-05 DIAGNOSIS — Z7289 Other problems related to lifestyle: Secondary | ICD-10-CM

## 2019-04-05 DIAGNOSIS — K219 Gastro-esophageal reflux disease without esophagitis: Secondary | ICD-10-CM | POA: Diagnosis present

## 2019-04-05 DIAGNOSIS — R8290 Unspecified abnormal findings in urine: Secondary | ICD-10-CM | POA: Diagnosis not present

## 2019-04-05 DIAGNOSIS — N400 Enlarged prostate without lower urinary tract symptoms: Secondary | ICD-10-CM | POA: Diagnosis present

## 2019-04-05 DIAGNOSIS — Z79899 Other long term (current) drug therapy: Secondary | ICD-10-CM

## 2019-04-05 DIAGNOSIS — N179 Acute kidney failure, unspecified: Secondary | ICD-10-CM | POA: Diagnosis present

## 2019-04-05 DIAGNOSIS — Z20828 Contact with and (suspected) exposure to other viral communicable diseases: Secondary | ICD-10-CM | POA: Diagnosis present

## 2019-04-05 DIAGNOSIS — R1030 Lower abdominal pain, unspecified: Secondary | ICD-10-CM | POA: Diagnosis not present

## 2019-04-05 DIAGNOSIS — Z882 Allergy status to sulfonamides status: Secondary | ICD-10-CM

## 2019-04-05 DIAGNOSIS — N10 Acute tubulo-interstitial nephritis: Secondary | ICD-10-CM | POA: Diagnosis not present

## 2019-04-05 DIAGNOSIS — Z801 Family history of malignant neoplasm of trachea, bronchus and lung: Secondary | ICD-10-CM

## 2019-04-05 DIAGNOSIS — I158 Other secondary hypertension: Secondary | ICD-10-CM | POA: Diagnosis present

## 2019-04-05 DIAGNOSIS — Z888 Allergy status to other drugs, medicaments and biological substances status: Secondary | ICD-10-CM

## 2019-04-05 DIAGNOSIS — M109 Gout, unspecified: Secondary | ICD-10-CM | POA: Diagnosis present

## 2019-04-05 LAB — URINALYSIS, ROUTINE W REFLEX MICROSCOPIC
Bilirubin Urine: NEGATIVE
Glucose, UA: 50 mg/dL — AB
Ketones, ur: NEGATIVE mg/dL
Leukocytes,Ua: NEGATIVE
Nitrite: NEGATIVE
Protein, ur: NEGATIVE mg/dL
Specific Gravity, Urine: 1.02 (ref 1.005–1.030)
pH: 5 (ref 5.0–8.0)

## 2019-04-05 LAB — LIPASE, BLOOD: Lipase: 38 U/L (ref 11–51)

## 2019-04-05 LAB — COMPREHENSIVE METABOLIC PANEL
ALT: 28 U/L (ref 0–44)
AST: 25 U/L (ref 15–41)
Albumin: 4.5 g/dL (ref 3.5–5.0)
Alkaline Phosphatase: 48 U/L (ref 38–126)
Anion gap: 11 (ref 5–15)
BUN: 53 mg/dL — ABNORMAL HIGH (ref 8–23)
CO2: 22 mmol/L (ref 22–32)
Calcium: 8.9 mg/dL (ref 8.9–10.3)
Chloride: 106 mmol/L (ref 98–111)
Creatinine, Ser: 2.05 mg/dL — ABNORMAL HIGH (ref 0.61–1.24)
GFR calc Af Amer: 36 mL/min — ABNORMAL LOW (ref >60)
GFR calc non Af Amer: 31 mL/min — ABNORMAL LOW (ref >60)
Glucose, Bld: 117 mg/dL — ABNORMAL HIGH (ref 70–99)
Potassium: 4.4 mmol/L (ref 3.5–5.1)
Sodium: 139 mmol/L (ref 135–145)
Total Bilirubin: 1.3 mg/dL — ABNORMAL HIGH (ref 0.3–1.2)
Total Protein: 7.5 g/dL (ref 6.5–8.1)

## 2019-04-05 LAB — CBC
HCT: 45.8 % (ref 39.0–52.0)
Hemoglobin: 15.5 g/dL (ref 13.0–17.0)
MCH: 32.9 pg (ref 26.0–34.0)
MCHC: 33.8 g/dL (ref 30.0–36.0)
MCV: 97.2 fL (ref 80.0–100.0)
Platelets: 232 10*3/uL (ref 150–400)
RBC: 4.71 MIL/uL (ref 4.22–5.81)
RDW: 12.9 % (ref 11.5–15.5)
WBC: 16.7 10*3/uL — ABNORMAL HIGH (ref 4.0–10.5)
nRBC: 0 % (ref 0.0–0.2)

## 2019-04-05 MED ORDER — PROBENECID 500 MG PO TABS
500.0000 mg | ORAL_TABLET | Freq: Every day | ORAL | Status: DC
  Administered 2019-04-06: 500 mg via ORAL
  Filled 2019-04-05 (×2): qty 1

## 2019-04-05 MED ORDER — DULOXETINE HCL 60 MG PO CPEP
60.0000 mg | ORAL_CAPSULE | Freq: Every day | ORAL | Status: DC
  Administered 2019-04-06 – 2019-04-07 (×2): 60 mg via ORAL
  Filled 2019-04-05: qty 2
  Filled 2019-04-05: qty 1

## 2019-04-05 MED ORDER — HYDROMORPHONE HCL 1 MG/ML IJ SOLN
0.5000 mg | Freq: Once | INTRAMUSCULAR | Status: AC
  Administered 2019-04-05: 0.5 mg via INTRAVENOUS
  Filled 2019-04-05: qty 1

## 2019-04-05 MED ORDER — TAMSULOSIN HCL 0.4 MG PO CAPS
0.4000 mg | ORAL_CAPSULE | Freq: Every day | ORAL | Status: DC
  Administered 2019-04-06 – 2019-04-07 (×2): 0.4 mg via ORAL
  Filled 2019-04-05 (×2): qty 1

## 2019-04-05 MED ORDER — ONDANSETRON HCL 4 MG/2ML IJ SOLN
2.0000 mg | Freq: Once | INTRAMUSCULAR | Status: AC
  Administered 2019-04-05: 2 mg via INTRAVENOUS
  Filled 2019-04-05: qty 2

## 2019-04-05 MED ORDER — ACETAMINOPHEN 650 MG RE SUPP: 650.0000 mg | Freq: Four times a day (QID) | RECTAL | Status: DC | PRN

## 2019-04-05 MED ORDER — MORPHINE SULFATE (PF) 2 MG/ML IV SOLN
2.0000 mg | Freq: Once | INTRAVENOUS | Status: AC
  Administered 2019-04-05: 2 mg via INTRAVENOUS
  Filled 2019-04-05: qty 1

## 2019-04-05 MED ORDER — ENOXAPARIN SODIUM 40 MG/0.4ML ~~LOC~~ SOLN
40.0000 mg | Freq: Every day | SUBCUTANEOUS | Status: DC
  Administered 2019-04-06: 40 mg via SUBCUTANEOUS
  Filled 2019-04-05: qty 0.4

## 2019-04-05 MED ORDER — ACETAMINOPHEN 325 MG PO TABS: 650.0000 mg | ORAL_TABLET | Freq: Four times a day (QID) | ORAL | Status: DC | PRN

## 2019-04-05 MED ORDER — PANTOPRAZOLE SODIUM 40 MG PO TBEC
80.0000 mg | DELAYED_RELEASE_TABLET | Freq: Two times a day (BID) | ORAL | Status: DC
  Administered 2019-04-06 – 2019-04-07 (×3): 80 mg via ORAL
  Filled 2019-04-05 (×3): qty 2

## 2019-04-05 MED ORDER — DUTASTERIDE 0.5 MG PO CAPS
0.5000 mg | ORAL_CAPSULE | ORAL | Status: DC
  Administered 2019-04-06: 0.5 mg via ORAL
  Filled 2019-04-05: qty 1

## 2019-04-05 MED ORDER — SODIUM CHLORIDE 0.9 % IV SOLN
1.0000 g | Freq: Every day | INTRAVENOUS | Status: DC
  Administered 2019-04-06: 1 g via INTRAVENOUS
  Filled 2019-04-05: qty 1

## 2019-04-05 MED ORDER — GABAPENTIN 300 MG PO CAPS
300.0000 mg | ORAL_CAPSULE | Freq: Three times a day (TID) | ORAL | Status: DC
  Administered 2019-04-06 (×2): 300 mg via ORAL
  Filled 2019-04-05 (×2): qty 1

## 2019-04-05 MED ORDER — MORPHINE SULFATE (PF) 2 MG/ML IV SOLN
2.0000 mg | INTRAVENOUS | Status: DC | PRN
  Administered 2019-04-06: 4 mg via INTRAVENOUS
  Administered 2019-04-06: 2 mg via INTRAVENOUS
  Filled 2019-04-05: qty 2
  Filled 2019-04-05: qty 1

## 2019-04-05 MED ORDER — SODIUM CHLORIDE 0.9 % IV BOLUS
1000.0000 mL | Freq: Once | INTRAVENOUS | Status: AC
  Administered 2019-04-05: 1000 mL via INTRAVENOUS

## 2019-04-05 MED ORDER — ATORVASTATIN CALCIUM 20 MG PO TABS
20.0000 mg | ORAL_TABLET | Freq: Every day | ORAL | Status: DC
  Administered 2019-04-06: 20 mg via ORAL
  Filled 2019-04-05: qty 1

## 2019-04-05 MED ORDER — ONDANSETRON HCL 4 MG/2ML IJ SOLN: 4.0000 mg | Freq: Four times a day (QID) | INTRAMUSCULAR | Status: DC | PRN

## 2019-04-05 MED ORDER — ONDANSETRON HCL 4 MG PO TABS: 4.0000 mg | ORAL_TABLET | Freq: Four times a day (QID) | ORAL | Status: DC | PRN

## 2019-04-05 MED ORDER — SODIUM CHLORIDE 0.9 % IV SOLN
1.0000 g | Freq: Once | INTRAVENOUS | Status: AC
  Administered 2019-04-06: 1 g via INTRAVENOUS
  Filled 2019-04-05: qty 10

## 2019-04-05 MED ORDER — HYDROMORPHONE HCL 1 MG/ML IJ SOLN
1.0000 mg | Freq: Once | INTRAMUSCULAR | Status: AC
  Administered 2019-04-06: 1 mg via INTRAVENOUS
  Filled 2019-04-05: qty 1

## 2019-04-05 NOTE — ED Provider Notes (Signed)
[facility]-EMERGENCY DEPT Provider Note   CSN: [id number] Arrival date & time: 04/05/19  2100     History Chief Complaint  Patient presents with  . Abdominal Pain    Miguel Williams is a 73 y.o. male history GERD, gout.  Patient presents today from urgent care for concern of appendicitis versus kidney stone.  Patient reports that around 1 PM today he developed sudden onset right lower quadrant pain a severe throbbing sharp sensation constant radiating across his lower abdomen no clear aggravating or alleviating factors.  Pain has gradually improved since onset he rates pain as only mild at this time.  He reports that he developed some nausea as well as loose stools today.  He reports blood work was performed at urgent care which showed elevated white blood cell count he was sent here for a CT scan. - Denies fever/chills, headache, neck pain, chest pain/shortness of breath, cough, back pain, testicular pain/swelling, dysuria/hematuria, blood in the stool, fall/injury or any additional concerns.  HPI     Past Medical History:  Diagnosis Date  . History of endoscopy 02/00/2011    Patient Active Problem List   Diagnosis Date Noted  . Pain, dental 01/01/2013  . Health maintenance examination 03/30/2011  . Abnormal prostate exam 03/08/2011  . Abnormal prostate specific antigen 03/08/2011  . GASTROESOPHAGEAL REFLUX DISEASE, CHRONIC 12/24/2008  . ACUTE BRONCHITIS 12/16/2008  . ASTHMA UNSPECIFIED WITH EXACERBATION 12/16/2008  . GANGLION CYST, TENDON SHEATH 06/27/2008  . FOOT PAIN, LEFT 05/23/2008  . ADVERSE DRUG REACTION 03/10/2008  . GOUT 03/08/2007  . 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 ELEVATED BP W/O HYPERTENSION 03/08/2007  . INSOMNIA, CHRONIC, MILD 10/02/2006  . CERUMEN IMPACTION, BILATERAL 10/02/2006    Past Surgical History:  Procedure Laterality Date  . CATARACT EXTRACTION  2012  . COLONOSCOPY W/ ENDOSCOPIC [place]    . TONSILLECTOMY  1952        Family History  Problem Relation Age of Onset  . Lung cancer Father     Social History   Tobacco Use  . Smoking status: Never Smoker  . Smokeless tobacco: Never Used  Substance Use Topics  . Alcohol use: Yes  . Drug use: No    Home Medications Prior to Admission medications   Medication Sig Start Date End Date Taking? Authorizing Provider  atorvastatin (LIPITOR) 20 MG tablet Take 20 mg by mouth daily. 03/18/19  Yes [provider]  COLCRYS 0.6 MG tablet TAKE 2 TABLETS (1.2 MG TOTAL) BY MOUTH 2 (TWO) TIMES DAILY. Patient taking differently: Take 1.2 mg by mouth 2 (two) times daily.  04/04/12  Yes Enid Baas, MD  DEXILANT 60 MG capsule TAKE 1 CAPSULE (60 MG TOTAL) BY MOUTH 2 (TWO) TIMES DAILY. Patient taking differently: Take 60 mg by mouth 2 (two) times daily.  04/04/12  Yes Enid Baas, MD  DULoxetine (CYMBALTA) 60 MG capsule Take 60 mg by mouth daily. 03/18/19  Yes [provider]  dutasteride (AVODART) 0.5 MG capsule Take 1 capsule (0.5 mg total) by mouth every other day. 02/12/13  Yes Enid Baas, MD  gabapentin (NEURONTIN) 300 MG capsule Take 1 capsule (300 mg total) by mouth 3 (three) times daily. 02/04/13  Yes Penumalli, Glenford Bayley, MD  probenecid (BENEMID) 500 MG tablet Take 1 tablet (500 mg total) by mouth daily. 02/12/13  Yes Enid Baas, MD  Tamsulosin HCl (FLOMAX) 0.4 MG CAPS TAKE 1 CAPSULE (0.4 MG TOTAL) BY MOUTH DAILY. Patient taking differently: Take 0.4 mg by mouth  daily.  04/04/12  Yes Enid Baas, MD  valsartan (DIOVAN) 40 MG tablet Take 40 mg by mouth daily. 03/19/19  Yes [provider]    Allergies    Sulfonamide derivatives and Tegretol [carbamazepine]  Review of Systems   Review of Systems Ten systems are reviewed and are negative for acute change except as noted in the HPI  Physical Exam Updated Vital Signs BP (!) 164/105   Pulse 95   Temp 97.8 F (36.6 C) (Oral)   Resp 18   SpO2 92%   Physical  Exam Constitutional:      General: He is not in acute distress.    Appearance: Normal appearance. He is well-developed. He is not ill-appearing or diaphoretic.  HENT:     Head: Normocephalic and atraumatic.     Right Ear: External ear normal.     Left Ear: External ear normal.     Nose: Nose normal.  Eyes:     General: Vision grossly intact. Gaze aligned appropriately.     Pupils: Pupils are equal, round, and reactive to light.  Neck:     Trachea: Trachea and phonation normal. No tracheal deviation.  Cardiovascular:     Rate and Rhythm: Normal rate and regular rhythm.  Pulmonary:     Effort: Pulmonary effort is normal. No respiratory distress.  Abdominal:     General: There is no distension.     Palpations: Abdomen is soft.     Tenderness: There is abdominal tenderness in the right lower quadrant, periumbilical area and suprapubic area. There is no right CVA tenderness, left CVA tenderness, guarding or rebound.  Musculoskeletal:        General: Normal range of motion.     Cervical back: Normal range of motion.  Skin:    General: Skin is warm and dry.  Neurological:     Mental Status: He is alert.     GCS: GCS eye subscore is 4. GCS verbal subscore is 5. GCS motor subscore is 6.     Comments: Speech is clear and goal oriented, follows commands Major Cranial nerves without deficit, no facial droop Moves extremities without ataxia, coordination intact  Psychiatric:        Behavior: Behavior normal.     ED Results / Procedures / Treatments   Labs (all labs ordered are listed, but only abnormal results are displayed) Labs Reviewed  COMPREHENSIVE METABOLIC PANEL - Abnormal; Notable for the following components:      Result Value   Glucose, Bld 117 (*)    BUN 53 (*)    Creatinine, Ser 2.05 (*)    Total Bilirubin 1.3 (*)    GFR calc non Af Amer 31 (*)    GFR calc Af Amer 36 (*)    All other components within normal limits  CBC - Abnormal; Notable for the following  components:   WBC 16.7 (*)    All other components within normal limits  URINALYSIS, ROUTINE W REFLEX MICROSCOPIC - Abnormal; Notable for the following components:   Glucose, UA 50 (*)    Hgb urine dipstick SMALL (*)    Bacteria, UA RARE (*)    All other components within normal limits  SARS CORONAVIRUS 2 (TAT 6-24 HRS)  URINE CULTURE  LIPASE, BLOOD    EKG None  Radiology CT ABDOMEN PELVIS WO CONTRAST  Result Date: 04/05/2019 CLINICAL DATA:  Right lower quadrant pain, appendicitis suspected EXAM: CT ABDOMEN AND PELVIS WITHOUT CONTRAST TECHNIQUE: Multidetector CT imaging of the abdomen  and pelvis was performed following the standard protocol without IV contrast. COMPARISON:  Abdominal radiograph 04/05/2011 FINDINGS: Lower chest: Lung bases are clear. Normal heart size. No pericardial effusion. Hepatobiliary: No focal liver abnormality is seen. No gallstones, gallbladder wall thickening, or biliary dilatation. Pancreas: Unremarkable. No pancreatic ductal dilatation or surrounding inflammatory changes. Spleen: Normal in size without focal abnormality. Adrenals/Urinary Tract: Normal adrenal glands. There is bilateral nonspecific perinephric stranding, asymmetrically increased on the right with some reactive free fluid in the posterior pararenal space. There is mild right hydroureteronephrosis without visible obstructing calculus. Punctate nonobstructing calculus seen interpolar right kidney however (2/32) no left urolithiasis. No visible or contour deforming renal lesions. The urinary bladder appears circumferentially thickened with perivesicular hazy stranding. Stomach/Bowel: Distal esophagus, stomach and duodenal sweep are unremarkable. No small bowel wall thickening or dilatation. No evidence of obstruction. A normal appendix is visualized. No colonic dilatation or wall thickening. Scattered colonic diverticula without focal pericolonic inflammation to suggest diverticulitis. Vascular/Lymphatic:  Atherosclerotic plaque within the normal caliber aorta. Some reactive retroperitoneal adenopathy is seen. Reproductive: The prostate and seminal vesicles are unremarkable. Other: No abdominopelvic free fluid or free gas. No bowel containing hernias. Small fat containing umbilical hernia. Mild posterior body wall edema. Musculoskeletal: Dextrocurvature of the upper lumbar spine apex at L1-2. Multilevel degenerative changes are present in the imaged portions of the spine. Most pronounced discogenic change noted at L2-3 and L5-S1. Mild retrolisthesis of L2 on 3 is noted. IMPRESSION: 1. There is mild right hydroureteronephrosis without visible obstructing calculus. There is bilateral nonspecific perinephric stranding, asymmetrically increased on the right with some reactive free fluid in the posterior pararenal space. Findings may be secondary to a recently passed stone, however, recommend correlation with urinalysis to exclude an ascending urinary tract infection. 2. Punctate nonobstructing right renal calculus. 3. Colonic diverticulosis without evidence for diverticulitis. 4. Small fat containing umbilical hernia. 5. Aortic Atherosclerosis (ICD10-I70.0). 6. 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 Electronically Signed   By: Lovena Le M.D.   On: 04/05/2019 22:47    Procedures Procedures (including critical care time)  Medications Ordered in ED Medications  cefTRIAXone (ROCEPHIN) 1 g in sodium chloride 0.9 % 100 mL IVPB (has no administration in time range)  sodium chloride 0.9 % bolus 1,000 mL (1,000 mLs Intravenous New Bag/Given (Non-Interop) 04/05/19 2156)  morphine 2 MG/ML injection 2 mg (2 mg Intravenous Given 04/05/19 2156)  ondansetron (ZOFRAN) injection 2 mg (2 mg Intravenous Given 04/05/19 2156)  HYDROmorphone (DILAUDID) injection 0.5 mg (0.5 mg Intravenous Given 04/05/19 2255)    ED Course  I have reviewed the triage vital signs and the nursing notes.  Pertinent labs & imaging  results that were available during my care of the patient were reviewed by me and considered in my medical decision making (see chart for details).  Clinical Course as of Apr 04 2348  Fri Apr 05, 2019  2348 Dr. Alcario Drought   [BM]    Clinical Course User Index [BM] Deliah Boston, PA-C   MDM Rules/Calculators/A&P    CBC shows leukocytosis of 16.7 CMP shows new AKI with creatinine 2.05, BUN 53, GFR too low to obtain contrasted CT scan Lipase within normal limit Urinalysis with bacteria, RBCs, WBCs, glucose CT abdomen pelvis:  IMPRESSION:  1. There is mild right hydroureteronephrosis without visible  obstructing calculus. There is bilateral nonspecific perinephric  stranding, asymmetrically increased on the right with some reactive  free fluid in the posterior pararenal space. Findings may be  secondary  to a recently passed stone, however, recommend correlation  with urinalysis to exclude an ascending urinary tract infection.  2. Punctate nonobstructing right renal calculus.  3. Colonic diverticulosis without evidence for diverticulitis.  4. Small fat containing umbilical hernia.  5. Aortic Atherosclerosis (ICD10-I70.0).  6. 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   Patient does have leukocytosis and first pulse was documented at 103 however patient is nontoxic-appearing and prior to receiving any medications or fluids tachycardia had resolved.  He is well-appearing, no acute distress, afebrile, not tachypneic or tachycardic, he is not hypotensive does not meet SIRS criteria.  Based on history and work-up as above concern for possible pyelonephritis, patient has new AKI as well he will need admission.  IV fluids and Rocephin has been ordered. - Patient reassessed resting comfortably no acute distress he is requesting stronger medication for pain, 0.5 mg Dilaudid has been ordered.  He states understanding of care plan and is agreeable for admission.  Screening Covid test  ordered. - 11:48 PM: Discussed case with hospitalist Dr. Julian ReilGardner who will be seeing patient for admission.   Patient seen and evaluated by Dr. Rhunette CroftNanavati during this visit.  Note: Portions of this report may have been transcribed using voice recognition software. Every effort was made to ensure accuracy; however, inadvertent computerized transcription errors may still be present. Final Clinical Impression(s) / ED Diagnoses Final diagnoses:  Acute pyelonephritis  AKI (acute kidney injury) [facility](HCC)    Rx / DC Orders ED Discharge Orders    None       Elizabeth PalauMorelli, Tyneisha Hegeman A, PA-C 04/05/19 2349    Derwood KaplanNanavati, Ankit, MD 04/06/19 1116

## 2019-04-05 NOTE — H&P (Signed)
History and Physical    Miguel Williams ZOX:[id number] DOB: 1945-06-16 DOA: 04/05/2019  PCP: Enid Baas, MD  Patient coming from: Home  I have personally briefly reviewed patient's old medical records in [facility] Link  Chief Complaint: R flank pain  HPI: Miguel Williams is a 73 y.o. male with medical history significant of Gout, BPH, HTN.  Patient presents to the ED with c/o RLQ abd pain / R flank pain.  Seen at UC earlier today with new onset pain at about 1pm today.  Pain gradually improved since onset, only mild at this time.  Did have nausea and loose stools today.  WBC at UC elevated, sent here for CT scan.   ED Course: CT: no appendicitis, does have perinephric stranding and mild R hydronephrosis, no obstructing stone.  raidologist suspects recently passed stone vs pyelonephritis.  UA 6-10 RBC, 6-10 WBC, no LE, no nitrites.  WBC 16k.  No fever.  BP 150s systolic.   Review of Systems: As per HPI, otherwise all review of systems negative.  Past Medical History:  Diagnosis Date  . History of endoscopy 02/00/2011    Past Surgical History:  Procedure Laterality Date  . CATARACT EXTRACTION  2012  . COLONOSCOPY W/ ENDOSCOPIC [place]    . TONSILLECTOMY  1952     reports that he has never smoked. He has never used smokeless tobacco. He reports current alcohol use. He reports that he does not use drugs.  Allergies  Allergen Reactions  . Sulfonamide Derivatives   . Tegretol [Carbamazepine] Hives    Family History  Problem Relation Age of Onset  . Lung cancer Father     Prior to Admission medications   Medication Sig Start Date End Date Taking? Authorizing Provider  atorvastatin (LIPITOR) 20 MG tablet Take 20 mg by mouth daily. 03/18/19  Yes [provider]  COLCRYS 0.6 MG tablet TAKE 2 TABLETS (1.2 MG TOTAL) BY MOUTH 2 (TWO) TIMES DAILY. Patient taking differently: Take 1.2 mg by mouth 2 (two) times daily.  04/04/12  Yes Enid Baas, MD  DEXILANT 60 MG  capsule TAKE 1 CAPSULE (60 MG TOTAL) BY MOUTH 2 (TWO) TIMES DAILY. Patient taking differently: Take 60 mg by mouth 2 (two) times daily.  04/04/12  Yes Enid Baas, MD  DULoxetine (CYMBALTA) 60 MG capsule Take 60 mg by mouth daily. 03/18/19  Yes [provider]  dutasteride (AVODART) 0.5 MG capsule Take 1 capsule (0.5 mg total) by mouth every other day. 02/12/13  Yes Enid Baas, MD  gabapentin (NEURONTIN) 300 MG capsule Take 1 capsule (300 mg total) by mouth 3 (three) times daily. 02/04/13  Yes Penumalli, Glenford Bayley, MD  probenecid (BENEMID) 500 MG tablet Take 1 tablet (500 mg total) by mouth daily. 02/12/13  Yes Enid Baas, MD  Tamsulosin HCl (FLOMAX) 0.4 MG CAPS TAKE 1 CAPSULE (0.4 MG TOTAL) BY MOUTH DAILY. Patient taking differently: Take 0.4 mg by mouth daily.  04/04/12  Yes Enid Baas, MD  valsartan (DIOVAN) 40 MG tablet Take 40 mg by mouth daily. 03/19/19  Yes [provider]    Physical Exam: Vitals:   04/05/19 2107 04/05/19 2200 04/05/19 2330  BP: (!) 157/93 (!) 156/92 (!) 164/105  Pulse: (!) 103 89 95  Resp: 18  18  Temp: 97.8 F (36.6 C)    TempSrc: Oral    SpO2: 98% 97% 92%    Constitutional: NAD, calm, comfortable Eyes: PERRL, lids and conjunctivae normal ENMT: Mucous membranes are moist. Posterior pharynx clear of  any exudate or lesions.Normal dentition.  Neck: normal, supple, no masses, no thyromegaly Respiratory: clear to auscultation bilaterally, no wheezing, no crackles. Normal respiratory effort. No accessory muscle use.  Cardiovascular: Regular rate and rhythm, no murmurs / rubs / gallops. No extremity edema. 2+ pedal pulses. No carotid bruits.  Abdomen: no tenderness, no masses palpated. No hepatosplenomegaly. Bowel sounds positive.  Musculoskeletal: no clubbing / cyanosis. No joint deformity upper and lower extremities. Good ROM, no contractures. Normal muscle tone.  Skin: no rashes, lesions, ulcers. No induration Neurologic: CN 2-12 grossly  intact. Sensation intact, DTR normal. Strength 5/5 in all 4.  Psychiatric: Normal judgment and insight. Alert and oriented x 3. Normal mood.    Labs on Admission: I have personally reviewed following labs and imaging studies  CBC: Recent Labs  Lab 04/05/19 2113  WBC 16.7*  HGB 15.5  HCT 45.8  MCV 97.2  PLT 232   Basic Metabolic Panel: Recent Labs  Lab 04/05/19 2113  NA 139  K 4.4  CL 106  CO2 22  GLUCOSE 117*  BUN 53*  CREATININE 2.05*  CALCIUM 8.9   GFR: CrCl cannot be calculated (Unknown ideal weight.). Liver Function Tests: Recent Labs  Lab 04/05/19 2113  AST 25  ALT 28  ALKPHOS 48  BILITOT 1.3*  PROT 7.5  ALBUMIN 4.5   Recent Labs  Lab 04/05/19 2113  LIPASE 38   No results for input(s): AMMONIA in the last 168 hours. Coagulation Profile: No results for input(s): INR, PROTIME in the last 168 hours. Cardiac Enzymes: No results for input(s): CKTOTAL, CKMB, CKMBINDEX, TROPONINI in the last 168 hours. BNP (last 3 results) No results for input(s): PROBNP in the last 8760 hours. HbA1C: No results for input(s): HGBA1C in the last 72 hours. CBG: No results for input(s): GLUCAP in the last 168 hours. Lipid Profile: No results for input(s): CHOL, HDL, LDLCALC, TRIG, CHOLHDL, LDLDIRECT in the last 72 hours. Thyroid Function Tests: No results for input(s): TSH, T4TOTAL, FREET4, T3FREE, THYROIDAB in the last 72 hours. Anemia Panel: No results for input(s): VITAMINB12, FOLATE, FERRITIN, TIBC, IRON, RETICCTPCT in the last 72 hours. Urine analysis:    Component Value Date/Time   COLORURINE YELLOW 04/05/2019 2113   APPEARANCEUR CLEAR 04/05/2019 2113   LABSPEC 1.020 04/05/2019 2113   PHURINE 5.0 04/05/2019 2113   GLUCOSEU 50 (A) 04/05/2019 2113   HGBUR SMALL (A) 04/05/2019 2113   BILIRUBINUR NEGATIVE 04/05/2019 2113   KETONESUR NEGATIVE 04/05/2019 2113   PROTEINUR NEGATIVE 04/05/2019 2113   NITRITE NEGATIVE 04/05/2019 2113   LEUKOCYTESUR NEGATIVE  04/05/2019 2113    Radiological Exams on Admission: CT ABDOMEN PELVIS WO CONTRAST  Result Date: 04/05/2019 CLINICAL DATA:  Right lower quadrant pain, appendicitis suspected EXAM: CT ABDOMEN AND PELVIS WITHOUT CONTRAST TECHNIQUE: Multidetector CT imaging of the abdomen and pelvis was performed following the standard protocol without IV contrast. COMPARISON:  Abdominal radiograph 04/05/2011 FINDINGS: Lower chest: Lung bases are clear. Normal heart size. No pericardial effusion. Hepatobiliary: No focal liver abnormality is seen. No gallstones, gallbladder wall thickening, or biliary dilatation. Pancreas: Unremarkable. No pancreatic ductal dilatation or surrounding inflammatory changes. Spleen: Normal in size without focal abnormality. Adrenals/Urinary Tract: Normal adrenal glands. There is bilateral nonspecific perinephric stranding, asymmetrically increased on the right with some reactive free fluid in the posterior pararenal space. There is mild right hydroureteronephrosis without visible obstructing calculus. Punctate nonobstructing calculus seen interpolar right kidney however (2/32) no left urolithiasis. No visible or contour deforming renal lesions. The urinary bladder  appears circumferentially thickened with perivesicular hazy stranding. Stomach/Bowel: Distal esophagus, stomach and duodenal sweep are unremarkable. No small bowel wall thickening or dilatation. No evidence of obstruction. A normal appendix is visualized. No colonic dilatation or wall thickening. Scattered colonic diverticula without focal pericolonic inflammation to suggest diverticulitis. Vascular/Lymphatic: Atherosclerotic plaque within the normal caliber aorta. Some reactive retroperitoneal adenopathy is seen. Reproductive: The prostate and seminal vesicles are unremarkable. Other: No abdominopelvic free fluid or free gas. No bowel containing hernias. Small fat containing umbilical hernia. Mild posterior body wall edema. Musculoskeletal:  Dextrocurvature of the upper lumbar spine apex at L1-2. Multilevel degenerative changes are present in the imaged portions of the spine. Most pronounced discogenic change noted at L2-3 and L5-S1. Mild retrolisthesis of L2 on 3 is noted. IMPRESSION: 1. There is mild right hydroureteronephrosis without visible obstructing calculus. There is bilateral nonspecific perinephric stranding, asymmetrically increased on the right with some reactive free fluid in the posterior pararenal space. Findings may be secondary to a recently passed stone, however, recommend correlation with urinalysis to exclude an ascending urinary tract infection. 2. Punctate nonobstructing right renal calculus. 3. Colonic diverticulosis without evidence for diverticulitis. 4. Small fat containing umbilical hernia. 5. Aortic Atherosclerosis (ICD10-I70.0). 6. 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 Electronically Signed   By: Lovena Le M.D.   On: 04/05/2019 22:47    EKG: Independently reviewed.  Assessment/Plan Principal Problem:   AKI (acute kidney injury) ([place]) Active Problems:   Pyelonephritis   Ureterolithiasis    1. AKI - 1. Most likely AKI to obstructive uropathy (from a R kidney stone) now resolved (see CT read), in which case we should see rapid improvement in BUN and creat overnight 2. Just in case will hold ARB and colchicine for tomorrow. 3. Alternatively this could represent progression of CKD as his last creat on file is from 2014. 4. Strict intake and output 5. If BUN/Creat does not improve overnight, then would get nephrology consult in AM. 2. ? Pyelonephritis vs recently passed kidney stone - 1. Morphine PRN 2. UA not very impressive for UTI 3. Though does have WBC of 16.7k. 4. Therefore: Empiric rocephin started in ED, will continue for now 5. UCx pending 3. BPH 1. Cont home meds 4. HTN - 1. Holding losartan, though may want to resume this in AM if creat starts trending down as I  expect.  DVT prophylaxis: Lovenox Code Status: Full Family Communication: No family in room Disposition Plan: Home, possibly in AM Consults called: None Admission status: Place in 44    Katrina Brosh, [facility]  How to contact the [facility] Attending or Consulting provider [place] or covering provider during after hours [place], for this patient?  1. Check the care team in [facility] and look for a) attending/consulting [facility] provider listed and b) the [facility] team listed 2. Log into [email or web address]  Amion Physician Scheduling and messaging for groups and whole hospitals  On call and physician scheduling software for group practices, residents, hospitalists and other medical providers for call, clinic, rotation and shift schedules. On[employer] is a hospital-wide system for scheduling doctors and paging doctors on call. EasyPlot is for scientific plotting and data analysis.  [email or web address]  and use [place]'s universal password to access. If you do not have the password, please contact the hospital operator.  3. Locate the [facility] provider you are looking for under [facility] and page to a number that you can be directly reached. 4. If you  still have difficulty reaching the provider, please page the [facility] (Director on Call) for the Hospitalists listed on amion for assistance.  04/05/2019, 11:57 PM

## 2019-04-05 NOTE — ED Notes (Signed)
Patient transported to CT 

## 2019-04-05 NOTE — ED Triage Notes (Signed)
Patient c/o RLQ abdominal pain today. Denies N/V/D. Seen at [facility] today.

## 2019-04-06 ENCOUNTER — Encounter (HOSPITAL_COMMUNITY): Payer: Self-pay | Admitting: Internal Medicine

## 2019-04-06 DIAGNOSIS — N179 Acute kidney failure, unspecified: Secondary | ICD-10-CM

## 2019-04-06 DIAGNOSIS — N201 Calculus of ureter: Secondary | ICD-10-CM | POA: Diagnosis not present

## 2019-04-06 DIAGNOSIS — M109 Gout, unspecified: Secondary | ICD-10-CM | POA: Diagnosis present

## 2019-04-06 DIAGNOSIS — N400 Enlarged prostate without lower urinary tract symptoms: Secondary | ICD-10-CM | POA: Diagnosis present

## 2019-04-06 DIAGNOSIS — Z888 Allergy status to other drugs, medicaments and biological substances status: Secondary | ICD-10-CM | POA: Diagnosis not present

## 2019-04-06 DIAGNOSIS — N136 Pyonephrosis: Secondary | ICD-10-CM | POA: Diagnosis present

## 2019-04-06 DIAGNOSIS — K219 Gastro-esophageal reflux disease without esophagitis: Secondary | ICD-10-CM | POA: Diagnosis present

## 2019-04-06 DIAGNOSIS — I158 Other secondary hypertension: Secondary | ICD-10-CM | POA: Diagnosis present

## 2019-04-06 DIAGNOSIS — N12 Tubulo-interstitial nephritis, not specified as acute or chronic: Secondary | ICD-10-CM | POA: Diagnosis not present

## 2019-04-06 DIAGNOSIS — N132 Hydronephrosis with renal and ureteral calculous obstruction: Secondary | ICD-10-CM | POA: Diagnosis not present

## 2019-04-06 DIAGNOSIS — Z79899 Other long term (current) drug therapy: Secondary | ICD-10-CM | POA: Diagnosis not present

## 2019-04-06 DIAGNOSIS — N1330 Unspecified hydronephrosis: Secondary | ICD-10-CM | POA: Diagnosis not present

## 2019-04-06 DIAGNOSIS — Z20828 Contact with and (suspected) exposure to other viral communicable diseases: Secondary | ICD-10-CM | POA: Diagnosis present

## 2019-04-06 DIAGNOSIS — Z7289 Other problems related to lifestyle: Secondary | ICD-10-CM | POA: Diagnosis not present

## 2019-04-06 DIAGNOSIS — Z9849 Cataract extraction status, unspecified eye: Secondary | ICD-10-CM | POA: Diagnosis not present

## 2019-04-06 DIAGNOSIS — Z882 Allergy status to sulfonamides status: Secondary | ICD-10-CM | POA: Diagnosis not present

## 2019-04-06 DIAGNOSIS — Z801 Family history of malignant neoplasm of trachea, bronchus and lung: Secondary | ICD-10-CM | POA: Diagnosis not present

## 2019-04-06 DIAGNOSIS — E875 Hyperkalemia: Secondary | ICD-10-CM | POA: Diagnosis present

## 2019-04-06 LAB — PHOSPHORUS: Phosphorus: 3.1 mg/dL (ref 2.5–4.6)

## 2019-04-06 LAB — BASIC METABOLIC PANEL
Anion gap: 10 (ref 5–15)
Anion gap: 20 — ABNORMAL HIGH (ref 5–15)
BUN: 46 mg/dL — ABNORMAL HIGH (ref 8–23)
BUN: 52 mg/dL — ABNORMAL HIGH (ref 8–23)
CO2: 23 mmol/L (ref 22–32)
CO2: 14 mmol/L — ABNORMAL LOW (ref 22–32)
Calcium: 8.7 mg/dL — ABNORMAL LOW (ref 8.9–10.3)
Calcium: 9.1 mg/dL (ref 8.9–10.3)
Chloride: 108 mmol/L (ref 98–111)
Chloride: 108 mmol/L (ref 98–111)
Creatinine, Ser: 2.03 mg/dL — ABNORMAL HIGH (ref 0.61–1.24)
Creatinine, Ser: 2.14 mg/dL — ABNORMAL HIGH (ref 0.61–1.24)
GFR calc Af Amer: 36 mL/min — ABNORMAL LOW (ref >60)
GFR calc Af Amer: 34 mL/min — ABNORMAL LOW (ref >60)
GFR calc non Af Amer: 31 mL/min — ABNORMAL LOW (ref >60)
GFR calc non Af Amer: 29 mL/min — ABNORMAL LOW (ref >60)
Glucose, Bld: 135 mg/dL — ABNORMAL HIGH (ref 70–99)
Glucose, Bld: 112 mg/dL — ABNORMAL HIGH (ref 70–99)
Potassium: 5.3 mmol/L — ABNORMAL HIGH (ref 3.5–5.1)
Potassium: 4.7 mmol/L (ref 3.5–5.1)
Sodium: 141 mmol/L (ref 135–145)
Sodium: 142 mmol/L (ref 135–145)

## 2019-04-06 LAB — CBC
HCT: 42.4 % (ref 39.0–52.0)
Hemoglobin: 14 g/dL (ref 13.0–17.0)
MCH: 32.3 pg (ref 26.0–34.0)
MCHC: 33 g/dL (ref 30.0–36.0)
MCV: 97.9 fL (ref 80.0–100.0)
Platelets: 196 10*3/uL (ref 150–400)
RBC: 4.33 MIL/uL (ref 4.22–5.81)
RDW: 13 % (ref 11.5–15.5)
WBC: 12.6 10*3/uL — ABNORMAL HIGH (ref 4.0–10.5)
nRBC: 0 % (ref 0.0–0.2)

## 2019-04-06 LAB — MAGNESIUM: Magnesium: 2.2 mg/dL (ref 1.7–2.4)

## 2019-04-06 LAB — PROCALCITONIN: Procalcitonin: <0.1 ng/mL

## 2019-04-06 LAB — SARS CORONAVIRUS 2 (TAT 6-24 HRS): SARS Coronavirus 2: NEGATIVE

## 2019-04-06 MED ORDER — GABAPENTIN 100 MG PO CAPS
100.0000 mg | ORAL_CAPSULE | Freq: Three times a day (TID) | ORAL | Status: DC
  Administered 2019-04-06 – 2019-04-07 (×2): 100 mg via ORAL
  Filled 2019-04-06 (×2): qty 1

## 2019-04-06 MED ORDER — LORAZEPAM 2 MG/ML IJ SOLN: 1.0000 mg | INTRAMUSCULAR | Status: DC | PRN

## 2019-04-06 MED ORDER — ACETAMINOPHEN 500 MG PO TABS
500.0000 mg | ORAL_TABLET | Freq: Four times a day (QID) | ORAL | Status: DC | PRN
  Administered 2019-04-06 – 2019-04-07 (×3): 500 mg via ORAL
  Filled 2019-04-06 (×3): qty 1

## 2019-04-06 MED ORDER — LORAZEPAM 1 MG PO TABS: 1.0000 mg | ORAL_TABLET | ORAL | Status: DC | PRN

## 2019-04-06 MED ORDER — VITAMIN B-1 100 MG PO TABS
100.0000 mg | ORAL_TABLET | Freq: Every day | ORAL | Status: DC
  Administered 2019-04-06 – 2019-04-07 (×2): 100 mg via ORAL
  Filled 2019-04-06 (×2): qty 1

## 2019-04-06 MED ORDER — THIAMINE HCL 100 MG/ML IJ SOLN
100.0000 mg | Freq: Every day | INTRAMUSCULAR | Status: DC
  Filled 2019-04-06 (×2): qty 1

## 2019-04-06 MED ORDER — FOLIC ACID 1 MG PO TABS
1.0000 mg | ORAL_TABLET | Freq: Every day | ORAL | Status: DC
  Administered 2019-04-06 – 2019-04-07 (×2): 1 mg via ORAL
  Filled 2019-04-06 (×2): qty 1

## 2019-04-06 MED ORDER — SODIUM CHLORIDE 0.9 % IV SOLN
INTRAVENOUS | Status: DC | PRN
  Administered 2019-04-06: 22:00:00 via INTRAVENOUS

## 2019-04-06 MED ORDER — SODIUM CHLORIDE 0.9 % IV BOLUS: 500.0000 mL | Freq: Once | INTRAVENOUS | Status: DC

## 2019-04-06 MED ORDER — ADULT MULTIVITAMIN W/MINERALS CH
1.0000 | ORAL_TABLET | Freq: Every day | ORAL | Status: DC
  Administered 2019-04-06 – 2019-04-07 (×2): 1 via ORAL
  Filled 2019-04-06 (×2): qty 1

## 2019-04-06 NOTE — Progress Notes (Signed)
Md notified of need for additional pain medications. Pt states that Morphine is not working for his pain.

## 2019-04-06 NOTE — Progress Notes (Signed)
PROGRESS NOTE    Miguel Williams    Code Status: Full Code  XBM:[id number] DOB: 1946/02/24 DOA: 04/05/2019  PCP: Stefanie Libel, MD    Hospital Summary  This is a 73 year old male with a history of gout, BPH, hypertension, alcohol use who presented to the ED with complaints of acute onset right lower quadrant abdominal pain/flank pain.  He was seen at urgent care earlier on 12/11 and was sent to ED after he was found to have elevated WBC.  In the ED patient had a CT which showed perinephric stranding and mild right hydronephrosis without obstructing stone and suspected patient recently passed a stone versus pyelonephritis.  He was found to have an AKI secondary to obstructive uropathy from a suspected passed right kidney stone.  On a.m. of 12/12 patient continued to have persistent pain despite morphine and elevated creatinine of 2.0 (unknown baseline as we do not have prior records) and urology was consulted who stated there is a small punctate stone visible on CT scan and recommended observing overnight and making n.p.o. for possible procedure in a.m. pending clinical status and labs.  A & P   Principal Problem:   AKI (acute kidney injury) ([place]) Active Problems:   Pyelonephritis   Ureterolithiasis   Possible distal right ureteral calculus Per urology: It is possible that he has a very small punctate distal right UVJ calculus.  WBC improved 16-> 12 -Follow-up renal function in a.m. if worsening or not improving he may benefit from intervention with ureteroscopy and possible ureteral stent placement and will be n.p.o. after midnight for possible procedure.  If renal function/clinically is improved then he can likely follow-up as outpatient with repeat imaging -Strain urine -Morphine every 4 hours as needed for severe pain and Tylenol for moderate to severe pain -On Rocephin  Alcohol use Reports he drinks 2 glasses of wine daily with last drink on 12/11 prior to admission -CIWA protocol   AKI Creatinine stable at 2.03.  Unknown baseline -Try to get records from PCP -Follow-up in a.m. -Renally dose gabapentin  BPH -Continue home meds  Hypertension -Holding losartan  Asymptomatic hyperkalemia K5.3 -IV fluids  Gout -On probenecid, consider holding due to renal function -Colchicine on hold   DVT prophylaxis: Lovenox Family Communication: No family at bedside Disposition Plan: Pending clinical improvement, possible DC tomorrow  Consultants  Urology  Procedures  None  Antibiotics  Ceftriaxone      Subjective   Patient reports that he has persistent right flank pain despite morphine this a.m.  States that he had pepperoni night prior to the onset of symptoms.  He is following with PCP for treatment of his gout.  Has never had a renal stone before.  Reports that he drinks 2 glasses of wine daily with his last glass on 12/11 prior to admission.  Denies hematuria or frequency.  Denies any other complaints.  Objective   Vitals:   04/06/19 0349 04/06/19 0504 04/06/19 1314 04/06/19 1356  BP:  (!) 152/96 (!) 148/88 130/83  Pulse:  85 89 98  Resp:  16  18  Temp:  98.5 F (36.9 C)  98 F (36.7 C)  TempSrc:  Oral  Oral  SpO2:  95%  95%  Weight: 84.9 kg     Height: 5\' 8"  (1.727 m)       Intake/Output Summary (Last 24 hours) at 04/06/2019 1610 Last data filed at 04/06/2019 1556 Gross per 24 hour  Intake 2020 ml  Output 300 ml  Net 1720  ml   Filed Weights   04/06/19 0349  Weight: 84.9 kg    Examination:  Physical Exam Vitals and nursing note reviewed.  Constitutional:      Appearance: Normal appearance.  HENT:     Head: Normocephalic and atraumatic.     Nose: Nose normal.     Mouth/Throat:     Mouth: Mucous membranes are moist.  Eyes:     Extraocular Movements: Extraocular movements intact.  Cardiovascular:     Rate and Rhythm: Normal rate and regular rhythm.  Pulmonary:     Effort: Pulmonary effort is normal.     Breath sounds:  Normal breath sounds.  Abdominal:     General: Abdomen is flat.     Palpations: Abdomen is soft.  Musculoskeletal:        General: No swelling. Normal range of motion.     Cervical back: Normal range of motion. No rigidity.  Neurological:     General: No focal deficit present.     Mental Status: He is alert. Mental status is at baseline.  Psychiatric:        Mood and Affect: Mood normal.        Behavior: Behavior normal.     Data Reviewed: I have personally reviewed following labs and imaging studies  CBC: Recent Labs  Lab 04/05/19 2113 04/06/19 0233  WBC 16.7* 12.6*  HGB 15.5 14.0  HCT 45.8 42.4  MCV 97.2 97.9  PLT 232 196   Basic Metabolic Panel: Recent Labs  Lab 04/05/19 2113 04/06/19 0233  NA 139 141  K 4.4 5.3*  CL 106 108  CO2 22 23  GLUCOSE 117* 135*  BUN 53* 46*  CREATININE 2.05* 2.03*  CALCIUM 8.9 8.7*  MG  --  2.2  PHOS  --  3.1   GFR: Estimated Creatinine Clearance: 33.9 mL/min (A) (by C-G formula based on SCr of 2.03 mg/dL (H)). Liver Function Tests: Recent Labs  Lab 04/05/19 2113  AST 25  ALT 28  ALKPHOS 48  BILITOT 1.3*  PROT 7.5  ALBUMIN 4.5   Recent Labs  Lab 04/05/19 2113  LIPASE 38   No results for input(s): AMMONIA in the last 168 hours. Coagulation Profile: No results for input(s): INR, PROTIME in the last 168 hours. Cardiac Enzymes: No results for input(s): CKTOTAL, CKMB, CKMBINDEX, TROPONINI in the last 168 hours. BNP (last 3 results) No results for input(s): PROBNP in the last 8760 hours. HbA1C: No results for input(s): HGBA1C in the last 72 hours. CBG: No results for input(s): GLUCAP in the last 168 hours. Lipid Profile: No results for input(s): CHOL, HDL, LDLCALC, TRIG, CHOLHDL, LDLDIRECT in the last 72 hours. Thyroid Function Tests: No results for input(s): TSH, T4TOTAL, FREET4, T3FREE, THYROIDAB in the last 72 hours. Anemia Panel: No results for input(s): VITAMINB12, FOLATE, FERRITIN, TIBC, IRON, RETICCTPCT in  the last 72 hours. Sepsis Labs: Recent Labs  Lab 04/05/19 2113  PROCALCITON <0.10    Recent Results (from the past 240 hour(s))  SARS CORONAVIRUS 2 (TAT 6-24 HRS) Nasopharyngeal Nasopharyngeal Swab     Status: None   Collection Time: 04/05/19 10:58 PM   Specimen: Nasopharyngeal Swab  Result Value Ref Range Status   SARS Coronavirus 2 NEGATIVE NEGATIVE Final    Comment: (NOTE) SARS-CoV-2 target nucleic acids are NOT DETECTED. The SARS-CoV-2 RNA is generally detectable in upper and lower respiratory specimens during the acute phase of infection. Negative results do not preclude SARS-CoV-2 infection, do not rule out co-infections with  other pathogens, and should not be used as the sole basis for treatment or other patient management decisions. Negative results must be combined with clinical observations, patient history, and epidemiological information. The expected result is Negative. Fact Sheet for Patients: HairSlick.no Fact Sheet for Healthcare Providers: quierodirigir.com This test is not yet approved or cleared by the [place] FDA and  has been authorized for detection and/or diagnosis of SARS-CoV-2 by FDA under an Emergency Use Authorization (EUA). This EUA will remain  in effect (meaning this test can be used) for the duration of the COVID-19 declaration under Section 56 4(b)(1) of the Act, 21 U.S.C. section 360bbb-3(b)(1), unless the authorization is terminated or revoked sooner. Performed at [facility], [address] [place], [place] [address]          Radiology Studies: CT ABDOMEN PELVIS WO CONTRAST  Result Date: 04/05/2019 CLINICAL DATA:  Right lower quadrant pain, appendicitis suspected EXAM: CT ABDOMEN AND PELVIS WITHOUT CONTRAST TECHNIQUE: Multidetector CT imaging of the abdomen and pelvis was performed following the standard protocol without IV contrast. COMPARISON:  Abdominal radiograph  04/05/2011 FINDINGS: Lower chest: Lung bases are clear. Normal heart size. No pericardial effusion. Hepatobiliary: No focal liver abnormality is seen. No gallstones, gallbladder wall thickening, or biliary dilatation. Pancreas: Unremarkable. No pancreatic ductal dilatation or surrounding inflammatory changes. Spleen: Normal in size without focal abnormality. Adrenals/Urinary Tract: Normal adrenal glands. There is bilateral nonspecific perinephric stranding, asymmetrically increased on the right with some reactive free fluid in the posterior pararenal space. There is mild right hydroureteronephrosis without visible obstructing calculus. Punctate nonobstructing calculus seen interpolar right kidney however (2/32) no left urolithiasis. No visible or contour deforming renal lesions. The urinary bladder appears circumferentially thickened with perivesicular hazy stranding. Stomach/Bowel: Distal esophagus, stomach and duodenal sweep are unremarkable. No small bowel wall thickening or dilatation. No evidence of obstruction. A normal appendix is visualized. No colonic dilatation or wall thickening. Scattered colonic diverticula without focal pericolonic inflammation to suggest diverticulitis. Vascular/Lymphatic: Atherosclerotic plaque within the normal caliber aorta. Some reactive retroperitoneal adenopathy is seen. Reproductive: The prostate and seminal vesicles are unremarkable. Other: No abdominopelvic free fluid or free gas. No bowel containing hernias. Small fat containing umbilical hernia. Mild posterior body wall edema. Musculoskeletal: Dextrocurvature of the upper lumbar spine apex at L1-2. Multilevel degenerative changes are present in the imaged portions of the spine. Most pronounced discogenic change noted at L2-3 and L5-S1. Mild retrolisthesis of L2 on 3 is noted. IMPRESSION: 1. There is mild right hydroureteronephrosis without visible obstructing calculus. There is bilateral nonspecific perinephric stranding,  asymmetrically increased on the right with some reactive free fluid in the posterior pararenal space. Findings may be secondary to a recently passed stone, however, recommend correlation with urinalysis to exclude an ascending urinary tract infection. 2. Punctate nonobstructing right renal calculus. 3. Colonic diverticulosis without evidence for diverticulitis. 4. Small fat containing umbilical hernia. 5. Aortic Atherosclerosis (ICD10-I70.0). 6. 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 Electronically Signed   By: Kreg Shropshire M.D.   On: 04/05/2019 22:47        Scheduled Meds: . atorvastatin  20 mg Oral q1800  . DULoxetine  60 mg Oral Daily  . dutasteride  0.5 mg Oral QODAY  . enoxaparin (LOVENOX) injection  40 mg Subcutaneous Daily  . folic acid  1 mg Oral Daily  . gabapentin  300 mg Oral TID  . multivitamin with minerals  1 tablet Oral Daily  . pantoprazole  80 mg Oral  BID  . probenecid  500 mg Oral Daily  . tamsulosin  0.4 mg Oral Daily  . thiamine  100 mg Oral Daily   Or  . thiamine  100 mg Intravenous Daily   Continuous Infusions: . cefTRIAXone (ROCEPHIN)  IV       LOS: 0 days    Time spent: 25 minutes with over 50% of the time coordinating the patient's care    Jae DireJared E Tehya Leath, DO [facility] Pager [phone]  If 7PM-7AM, please contact night-coverage [email or web address] Password [email or web address] 04/06/2019, 4:10 PM

## 2019-04-06 NOTE — Progress Notes (Signed)
Patient arrived at approximately 0056 from he ED. He is alert and verbally responsive and voiced no complaints at this time.

## 2019-04-06 NOTE — Plan of Care (Signed)
?  Problem: Clinical Measurements: ?Goal: Respiratory complications will improve ?Outcome: Progressing ?  ?Problem: Clinical Measurements: ?Goal: Cardiovascular complication will be avoided ?Outcome: Progressing ?  ?Problem: Nutrition: ?Goal: Adequate nutrition will be maintained ?Outcome: Progressing ?  ?Problem: Pain Managment: ?Goal: General experience of comfort will improve ?Outcome: Progressing ?  ?

## 2019-04-06 NOTE — Progress Notes (Signed)
Pt resting well overall and reports improved pain from this morning. He states he feels like his pain is easing off. Rn still medicating for needs as they arise. Pt remains stable. Rn will continue to monitor.

## 2019-04-06 NOTE — Consult Note (Signed)
Urology Consult   Physician requesting consult: Dr. Whitney Post  Reason for consult: Right hydronephrosis with acute kidney injury  History of Present Illness: Miguel Williams is a 73 y.o. gentleman who was in his normal state of health until approximately 1 PM yesterday when he developed the acute onset of right sided flank and abdominal pain.  This became progressively severe throughout the day causing him to present to an urgent care clinic.  He was noted to have microscopic hematuria and was referred to the emergency department to undergo CT imaging for possible kidney stone.  He has no prior history of urolithiasis.  A CT scan demonstrated right-sided hydronephrosis with stranding around the right kidney but no definite ureteral stone indicating that he may have recently passed a stone.  He did have a small punctate nonobstructing right renal stone.  He was also noted to have an elevated serum creatinine of 2.0.  The most recent serum creatinine that I have seen was 1.4 in January 2017 per his primary care physician, Dr. Waynard Edwards.  According to the patient, he did have labs last spring his routine physical and was not informed that he had any severe kidney dysfunction.  He has had some mild nausea but has otherwise been able to eat and drink relatively well.  He denies any fever.  On independent review of his CT scan, there could possibly be a very small punctate distal right ureteral stone.  His past urologic history is otherwise remarkable for history of BPH and LUTS.  He is previously been treated with Vesicare and other medications for his lower urinary tract symptoms.  He has previously been treated by Dr. Isabel Caprice.  He is currently taking tamsulosin and this has been managed by his primary care doctor.  Past Medical History:  Diagnosis Date  . History of endoscopy 02/00/2011    Past Surgical History:  Procedure Laterality Date  . CATARACT EXTRACTION  2012  . COLONOSCOPY W/ ENDOSCOPIC [place]     . TONSILLECTOMY  1952    Current Hospital Medications:  Home Meds:  No current facility-administered medications on file prior to encounter.   Current Outpatient Medications on File Prior to Encounter  Medication Sig Dispense Refill  . atorvastatin (LIPITOR) 20 MG tablet Take 20 mg by mouth daily.    Marland Kitchen COLCRYS 0.6 MG tablet TAKE 2 TABLETS (1.2 MG TOTAL) BY MOUTH 2 (TWO) TIMES DAILY. (Patient taking differently: Take 1.2 mg by mouth 2 (two) times daily. ) 120 tablet 5  . DEXILANT 60 MG capsule TAKE 1 CAPSULE (60 MG TOTAL) BY MOUTH 2 (TWO) TIMES DAILY. (Patient taking differently: Take 60 mg by mouth 2 (two) times daily. ) 60 capsule 5  . DULoxetine (CYMBALTA) 60 MG capsule Take 60 mg by mouth daily.    Marland Kitchen dutasteride (AVODART) 0.5 MG capsule Take 1 capsule (0.5 mg total) by mouth every other day. 30 capsule 0  . gabapentin (NEURONTIN) 300 MG capsule Take 1 capsule (300 mg total) by mouth 3 (three) times daily. 90 capsule 5  . probenecid (BENEMID) 500 MG tablet Take 1 tablet (500 mg total) by mouth daily. 30 tablet 1  . Tamsulosin HCl (FLOMAX) 0.4 MG CAPS TAKE 1 CAPSULE (0.4 MG TOTAL) BY MOUTH DAILY. (Patient taking differently: Take 0.4 mg by mouth daily. ) 30 capsule 6  . valsartan (DIOVAN) 40 MG tablet Take 40 mg by mouth daily.       Scheduled Meds: . atorvastatin  20 mg Oral q1800  .  DULoxetine  60 mg Oral Daily  . dutasteride  0.5 mg Oral QODAY  . enoxaparin (LOVENOX) injection  40 mg Subcutaneous Daily  . gabapentin  300 mg Oral TID  . pantoprazole  80 mg Oral BID  . probenecid  500 mg Oral Daily  . tamsulosin  0.4 mg Oral Daily   Continuous Infusions: . cefTRIAXone (ROCEPHIN)  IV     PRN Meds:.acetaminophen, morphine injection, ondansetron **OR** ondansetron (ZOFRAN) IV  Allergies:  Allergies  Allergen Reactions  . Sulfonamide Derivatives   . Tegretol [Carbamazepine] Hives    Family History  Problem Relation Age of Onset  . Lung cancer Father     Social  History:  reports that he has never smoked. He has never used smokeless tobacco. He reports current alcohol use. He reports that he does not use drugs.  ROS: A complete review of systems was performed.  All systems are negative except for pertinent findings as noted.  Physical Exam:  Vital signs in last 24 hours: Temp:  [97.8 F (36.6 C)-98.5 F (36.9 C)] 98.5 F (36.9 C) (12/12 0504) Pulse Rate:  [85-103] 85 (12/12 0504) Resp:  [16-18] 16 (12/12 0504) BP: (152-165)/(85-105) 152/96 (12/12 0504) SpO2:  [92 %-98 %] 95 % (12/12 0504) Weight:  [84.9 kg] 84.9 kg (12/12 0349) Constitutional:  Alert and oriented, No acute distress Cardiovascular: Regular rate and rhythm, No JVD Respiratory: Normal respiratory effort, Lungs clear bilaterally GI: Abdomen is soft, mild right-sided abdominal tenderness, nondistended, no abdominal masses GU: Mild right CVA tenderness Lymphatic: No lymphadenopathy Neurologic: Grossly intact, no focal deficits Psychiatric: Normal mood and affect  Laboratory Data:  Recent Labs    04/05/19 2113 04/06/19 0233  WBC 16.7* 12.6*  HGB 15.5 14.0  HCT 45.8 42.4  PLT 232 196    Recent Labs    04/05/19 2113 04/06/19 0233  NA 139 141  K 4.4 5.3*  CL 106 108  GLUCOSE 117* 135*  BUN 53* 46*  CALCIUM 8.9 8.7*  CREATININE 2.05* 2.03*     Results for orders placed or performed during the hospital encounter of 04/05/19 (from the past 24 hour(s))  Lipase, blood     Status: None   Collection Time: 04/05/19  9:13 PM  Result Value Ref Range   Lipase 38 11 - 51 U/L  Comprehensive metabolic panel     Status: Abnormal   Collection Time: 04/05/19  9:13 PM  Result Value Ref Range   Sodium 139 135 - 145 mmol/L   Potassium 4.4 3.5 - 5.1 mmol/L   Chloride 106 98 - 111 mmol/L   CO2 22 22 - 32 mmol/L   Glucose, Bld 117 (H) 70 - 99 mg/dL   BUN 53 (H) 8 - 23 mg/dL   Creatinine, Ser 2.87 (H) 0.61 - 1.24 mg/dL   Calcium 8.9 8.9 - 86.7 mg/dL   Total Protein 7.5 6.5 -  8.1 g/dL   Albumin 4.5 3.5 - 5.0 g/dL   AST 25 15 - 41 U/L   ALT 28 0 - 44 U/L   Alkaline Phosphatase 48 38 - 126 U/L   Total Bilirubin 1.3 (H) 0.3 - 1.2 mg/dL   GFR calc non Af Amer 31 (L) >60 mL/min   GFR calc Af Amer 36 (L) >60 mL/min   Anion gap 11 5 - 15  CBC     Status: Abnormal   Collection Time: 04/05/19  9:13 PM  Result Value Ref Range   WBC 16.7 (H) 4.0 - 10.5  K/uL   RBC 4.71 4.22 - 5.81 MIL/uL   Hemoglobin 15.5 13.0 - 17.0 g/dL   HCT 16.145.8 09.639.0 - 04.552.0 %   MCV 97.2 80.0 - 100.0 fL   MCH 32.9 26.0 - 34.0 pg   MCHC 33.8 30.0 - 36.0 g/dL   RDW 40.912.9 81.111.5 - 91.415.5 %   Platelets 232 150 - 400 K/uL   nRBC 0.0 0.0 - 0.2 %  Urinalysis, Routine w reflex microscopic     Status: Abnormal   Collection Time: 04/05/19  9:13 PM  Result Value Ref Range   Color, Urine YELLOW YELLOW   APPearance CLEAR CLEAR   Specific Gravity, Urine 1.020 1.005 - 1.030   pH 5.0 5.0 - 8.0   Glucose, UA 50 (A) NEGATIVE mg/dL   Hgb urine dipstick SMALL (A) NEGATIVE   Bilirubin Urine NEGATIVE NEGATIVE   Ketones, ur NEGATIVE NEGATIVE mg/dL   Protein, ur NEGATIVE NEGATIVE mg/dL   Nitrite NEGATIVE NEGATIVE   Leukocytes,Ua NEGATIVE NEGATIVE   RBC / HPF 6-10 0 - 5 RBC/hpf   WBC, UA 6-10 0 - 5 WBC/hpf   Bacteria, UA RARE (A) NONE SEEN   Squamous Epithelial / LPF 0-5 0 - 5   Mucus PRESENT   Procalcitonin - Baseline     Status: None   Collection Time: 04/05/19  9:13 PM  Result Value Ref Range   Procalcitonin <0.10 ng/mL  CBC     Status: Abnormal   Collection Time: 04/06/19  2:33 AM  Result Value Ref Range   WBC 12.6 (H) 4.0 - 10.5 K/uL   RBC 4.33 4.22 - 5.81 MIL/uL   Hemoglobin 14.0 13.0 - 17.0 g/dL   HCT 78.242.4 95.639.0 - 21.352.0 %   MCV 97.9 80.0 - 100.0 fL   MCH 32.3 26.0 - 34.0 pg   MCHC 33.0 30.0 - 36.0 g/dL   RDW 08.613.0 57.811.5 - 46.915.5 %   Platelets 196 150 - 400 K/uL   nRBC 0.0 0.0 - 0.2 %  Basic metabolic panel     Status: Abnormal   Collection Time: 04/06/19  2:33 AM  Result Value Ref Range   Sodium  141 135 - 145 mmol/L   Potassium 5.3 (H) 3.5 - 5.1 mmol/L   Chloride 108 98 - 111 mmol/L   CO2 23 22 - 32 mmol/L   Glucose, Bld 135 (H) 70 - 99 mg/dL   BUN 46 (H) 8 - 23 mg/dL   Creatinine, Ser 6.292.03 (H) 0.61 - 1.24 mg/dL   Calcium 8.7 (L) 8.9 - 10.3 mg/dL   GFR calc non Af Amer 31 (L) >60 mL/min   GFR calc Af Amer 36 (L) >60 mL/min   Anion gap 10 5 - 15   No results found for this or any previous visit (from the past 240 hour(s)).  Renal Function: Recent Labs    04/05/19 2113 04/06/19 0233  CREATININE 2.05* 2.03*   Estimated Creatinine Clearance: 33.9 mL/min (A) (by C-G formula based on SCr of 2.03 mg/dL (H)).  Radiologic Imaging: CT ABDOMEN PELVIS WO CONTRAST  Result Date: 04/05/2019 CLINICAL DATA:  Right lower quadrant pain, appendicitis suspected EXAM: CT ABDOMEN AND PELVIS WITHOUT CONTRAST TECHNIQUE: Multidetector CT imaging of the abdomen and pelvis was performed following the standard protocol without IV contrast. COMPARISON:  Abdominal radiograph 04/05/2011 FINDINGS: Lower chest: Lung bases are clear. Normal heart size. No pericardial effusion. Hepatobiliary: No focal liver abnormality is seen. No gallstones, gallbladder wall thickening, or biliary dilatation. Pancreas: Unremarkable. No pancreatic  ductal dilatation or surrounding inflammatory changes. Spleen: Normal in size without focal abnormality. Adrenals/Urinary Tract: Normal adrenal glands. There is bilateral nonspecific perinephric stranding, asymmetrically increased on the right with some reactive free fluid in the posterior pararenal space. There is mild right hydroureteronephrosis without visible obstructing calculus. Punctate nonobstructing calculus seen interpolar right kidney however (2/32) no left urolithiasis. No visible or contour deforming renal lesions. The urinary bladder appears circumferentially thickened with perivesicular hazy stranding. Stomach/Bowel: Distal esophagus, stomach and duodenal sweep are  unremarkable. No small bowel wall thickening or dilatation. No evidence of obstruction. A normal appendix is visualized. No colonic dilatation or wall thickening. Scattered colonic diverticula without focal pericolonic inflammation to suggest diverticulitis. Vascular/Lymphatic: Atherosclerotic plaque within the normal caliber aorta. Some reactive retroperitoneal adenopathy is seen. Reproductive: The prostate and seminal vesicles are unremarkable. Other: No abdominopelvic free fluid or free gas. No bowel containing hernias. Small fat containing umbilical hernia. Mild posterior body wall edema. Musculoskeletal: Dextrocurvature of the upper lumbar spine apex at L1-2. Multilevel degenerative changes are present in the imaged portions of the spine. Most pronounced discogenic change noted at L2-3 and L5-S1. Mild retrolisthesis of L2 on 3 is noted. IMPRESSION: 1. There is mild right hydroureteronephrosis without visible obstructing calculus. There is bilateral nonspecific perinephric stranding, asymmetrically increased on the right with some reactive free fluid in the posterior pararenal space. Findings may be secondary to a recently passed stone, however, recommend correlation with urinalysis to exclude an ascending urinary tract infection. 2. Punctate nonobstructing right renal calculus. 3. Colonic diverticulosis without evidence for diverticulitis. 4. Small fat containing umbilical hernia. 5. Aortic Atherosclerosis (ICD10-I70.0). 6. 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 Electronically Signed   By: Lovena Le M.D.   On: 04/05/2019 22:47    I independently reviewed the above imaging studies.  Impression/Recommendation #1.  Possible distal right ureteral calculus: His clinical picture based on his symptoms, exam, and urinalysis is most consistent with a possible ureteral stone.  Furthermore, on independent review, I do think it is possible that he is a very small punctate distal right UVJ calculus.  I  have recommended that we continue to treat him as such with pain medication and straining his urine.  He is already on alpha-blocker therapy.  His renal function will be rechecked tomorrow morning.  If improved, he likely can follow-up as an outpatient with repeat imaging.  If his renal function is worsening or not improving, he may benefit from intervention with ureteroscopy and possible ureteral stent placement.  Please keep him n.p.o. after midnight.  Will reevaluate him tomorrow morning after his repeat labs.  If he does develop fever or signs of systemic infection, please notify me in the meantime.  Dutch Gray 04/06/2019, 11:01 AM    Pryor Curia MD   CC: Dr. Marva Panda

## 2019-04-07 DIAGNOSIS — N1330 Unspecified hydronephrosis: Secondary | ICD-10-CM

## 2019-04-07 LAB — CBC
HCT: 36.8 % — ABNORMAL LOW (ref 39.0–52.0)
Hemoglobin: 12 g/dL — ABNORMAL LOW (ref 13.0–17.0)
MCH: 32.1 pg (ref 26.0–34.0)
MCHC: 32.6 g/dL (ref 30.0–36.0)
MCV: 98.4 fL (ref 80.0–100.0)
Platelets: 179 10*3/uL (ref 150–400)
RBC: 3.74 MIL/uL — ABNORMAL LOW (ref 4.22–5.81)
RDW: 13.2 % (ref 11.5–15.5)
WBC: 6.8 10*3/uL (ref 4.0–10.5)
nRBC: 0 % (ref 0.0–0.2)

## 2019-04-07 LAB — BASIC METABOLIC PANEL
Anion gap: 9 (ref 5–15)
BUN: 41 mg/dL — ABNORMAL HIGH (ref 8–23)
CO2: 23 mmol/L (ref 22–32)
Calcium: 8.4 mg/dL — ABNORMAL LOW (ref 8.9–10.3)
Chloride: 108 mmol/L (ref 98–111)
Creatinine, Ser: 1.79 mg/dL — ABNORMAL HIGH (ref 0.61–1.24)
GFR calc Af Amer: 42 mL/min — ABNORMAL LOW (ref >60)
GFR calc non Af Amer: 37 mL/min — ABNORMAL LOW (ref >60)
Glucose, Bld: 121 mg/dL — ABNORMAL HIGH (ref 70–99)
Potassium: 4.4 mmol/L (ref 3.5–5.1)
Sodium: 140 mmol/L (ref 135–145)

## 2019-04-07 LAB — URINE CULTURE: Culture: NO GROWTH

## 2019-04-07 MED ORDER — HYDROCODONE-ACETAMINOPHEN 5-325 MG PO TABS: 1.0000 | ORAL_TABLET | Freq: Four times a day (QID) | ORAL | 0 refills | Status: AC | PRN

## 2019-04-07 MED ORDER — ACETAMINOPHEN 500 MG PO TABS: 500.0000 mg | ORAL_TABLET | Freq: Four times a day (QID) | ORAL | 0 refills | Status: DC | PRN

## 2019-04-07 NOTE — Discharge Summary (Signed)
Physician Discharge Summary  Miguel Williams VZD:[id number] DOB: [birth year] DOA: 04/05/2019  PCP: Enid Baas, MD  Admit date: 04/05/2019 Discharge date: 04/07/2019   Code Status: Full Code  Admitted From: Home Discharged to:  Home Home Health:no  Equipment/Devices:no  Discharge Condition:stable   Recommendations for Outpatient Follow-up   1. Follow up with PCP this week 2. Please follow up BMP/CBC  3. ARB and Colcrys held at discharge due to renal function  Hospital Summary  This is a 73 year old male with a history of gout, BPH, hypertension, alcohol use who presented to the ED with complaints of acute onset right lower quadrant abdominal pain/flank pain.  He was seen at urgent care earlier on 12/11 and was sent to ED after he was found to have elevated WBC.  In the ED patient had a CT which showed perinephric stranding and mild right hydronephrosis without obstructing stone and suspected patient recently passed a stone versus pyelonephritis.  He was found to have an AKI secondary to obstructive uropathy from a suspected passed right kidney stone.  On a.m. of 12/12 patient continued to have persistent pain despite morphine and elevated creatinine of 2.0 (unknown baseline as we do not have prior records) and urology was consulted who stated there is a small punctate stone visible on CT scan and recommended observing overnight and making n.p.o. for possible procedure in a.m. pending clinical status and labs.  Patient had improvement in symptoms and renal function. He was discharged in stable condition and recommended to continue holding his valsartan and colcrys, get repeat labs, and follow up with PCP this week.    A & P   Principal Problem:   AKI (acute kidney injury) (HCC) Active Problems:   Pyelonephritis   Ureterolithiasis   Suspected distal right ureteral calculus with hydronephrosis Per urology: It is possible that he has a very small punctate distal right UVJ calculus.   Leukocytosis resolved 16-> 12->6. Was NPO after midnight for possible procedure on 12/13 however patient had improvement and likely passed stone. He completed a 2 day course of Rocephin -Follow up outpatient -Follow up with urology in the next 2-3 weeks for a renal ultrasound to ensure resolution of hydronephrosis -Tylenol 500 mg q6h prn for mild-mod pain and Norco q6h prn for severe pain x 3 days. He was instructed not to drive, drink alcohol, take clonazepam or operate heavy machinery while taking this medication and call his PCP or return to the ED if pain is constant or worsening.   Alcohol use Reports he drinks 2 glasses of wine daily with last drink on 12/11 prior to admission, CIWA protocol in place while inpatient. -Advised to abstain from alcohol  AKI Creatinine 2.05->>>1.79 with hydration and holding nephrotoxic agents. He was continued on renally dosed gabapentin while hospitalized -BMP prior to PCP appointment -Losartan and Colcrys held at DC pending PCP appointment  BPH -Continue home meds  Hypertension -Holding losartan  Asymptomatic hyperkalemia Resolved  Gout -On probenecid, consider holding due to renal function -Colchicine on hold     Consultants  . Urology  Procedures  . None  Antibiotics  Rocephin x 2 days   Subjective  Patient seen and examined at bedside no acute distress and resting comfortably.  No events overnight.  Tolerating diet. In good spirits and anticipating discharge.   Denies any chest pain, shortness of breath, fever, nausea, vomiting, urinary or bowel complaints. Otherwise ROS negative   Objective   Discharge Exam: Vitals:   04/06/19 2132 04/07/19 0502  BP: 130/78 121/70  Pulse: 86 80  Resp: 16 16  Temp: 98.2 F (36.8 C) 98.3 F (36.8 C)  SpO2: 94% 98%   Vitals:   04/06/19 1314 04/06/19 1356 04/06/19 2132 04/07/19 0502  BP: (!) 148/88 130/83 130/78 121/70  Pulse: 89 98 86 80  Resp:  18 16 16   Temp:  98 F (36.7  C) 98.2 F (36.8 C) 98.3 F (36.8 C)  TempSrc:  Oral Oral Oral  SpO2:  95% 94% 98%  Weight:      Height:        Physical Exam Vitals and nursing note reviewed.  Constitutional:      Appearance: Normal appearance.  HENT:     Head: Normocephalic and atraumatic.     Nose: Nose normal.     Mouth/Throat:     Mouth: Mucous membranes are moist.  Eyes:     Extraocular Movements: Extraocular movements intact.  Cardiovascular:     Rate and Rhythm: Normal rate and regular rhythm.  Pulmonary:     Effort: Pulmonary effort is normal.     Breath sounds: Normal breath sounds.  Abdominal:     General: Abdomen is flat. There is no distension.     Palpations: Abdomen is soft.     Tenderness: There is no abdominal tenderness.  Musculoskeletal:        General: No swelling. Normal range of motion.     Cervical back: Normal range of motion. No rigidity.     Comments: Negative CVA tenderness  Neurological:     General: No focal deficit present.     Mental Status: He is alert. Mental status is at baseline.  Psychiatric:        Mood and Affect: Mood normal.        Behavior: Behavior normal.       The results of significant diagnostics from this hospitalization (including imaging, microbiology, ancillary and laboratory) are listed below for reference.     Microbiology: Recent Results (from the past 240 hour(s))  Urine culture     Status: None   Collection Time: 04/05/19  9:13 PM   Specimen: Urine, Random  Result Value Ref Range Status   Specimen Description   Final    URINE, RANDOM Performed at [facility] [address] [place], [place] [address]    Special Requests   Final    NONE Performed at [facility], [place] [address] [place], [place] [address]    Culture   Final    NO GROWTH Performed at [facility], [place] [address], [place], [place] [address]    Report Status 04/07/2019 FINAL  Final  SARS CORONAVIRUS 2 (TAT 6-24 HRS)  Nasopharyngeal Nasopharyngeal Swab     Status: None   Collection Time: 04/05/19 10:58 PM   Specimen: Nasopharyngeal Swab  Result Value Ref Range Status   SARS Coronavirus 2 NEGATIVE NEGATIVE Final    Comment: (NOTE) SARS-CoV-2 target nucleic acids are NOT DETECTED. The SARS-CoV-2 RNA is generally detectable in upper and lower respiratory specimens during the acute phase of infection. Negative results do not preclude SARS-CoV-2 infection, do not rule out co-infections with other pathogens, and should not be used as the sole basis for treatment or other patient management decisions. Negative results must be combined with clinical observations, patient history, and epidemiological information. The expected result is Negative. Fact Sheet for Patients: SugarRoll.be Fact Sheet for Healthcare Providers: https://www.woods-mathews.com/ This test is not yet approved or cleared  by the [place] and  has been authorized for detection and/or diagnosis of SARS-CoV-2 by FDA under an Emergency Use Authorization (EUA). This EUA will remain  in effect (meaning this test can be used) for the duration of the COVID-19 declaration under Section 56 4(b)(1) of the Act, 21 U.S.C. section 360bbb-3(b)(1), unless the authorization is terminated or revoked sooner. Performed at [facility], [address], [place], [place] [address]      Labs: BNP (last 3 results) No results for input(s): BNP in the last 8760 hours. Basic Metabolic Panel: Recent Labs  Lab 04/05/19 2113 04/06/19 0233 04/07/19 0452  NA 139 142  141 140  K 4.4 4.7  5.3* 4.4  CL 106 108  108 108  CO2 22 14*  23 23  GLUCOSE 117* 112*  135* 121*  BUN 53* 52*  46* 41*  CREATININE 2.05* 2.14*  2.03* 1.79*  CALCIUM 8.9 9.1  8.7* 8.4*  MG  --  2.2  --   PHOS  --  3.1  --    Liver Function Tests: Recent Labs  Lab 04/05/19 2113  AST 25  ALT 28  ALKPHOS 48  BILITOT 1.3*   PROT 7.5  ALBUMIN 4.5   Recent Labs  Lab 04/05/19 2113  LIPASE 38   No results for input(s): AMMONIA in the last 168 hours. CBC: Recent Labs  Lab 04/05/19 2113 04/06/19 0233 04/07/19 0452  WBC 16.7* 12.6* 6.8  HGB 15.5 14.0 12.0*  HCT 45.8 42.4 36.8*  MCV 97.2 97.9 98.4  PLT 232 196 179   Cardiac Enzymes: No results for input(s): CKTOTAL, CKMB, CKMBINDEX, TROPONINI in the last 168 hours. BNP: Invalid input(s): POCBNP CBG: No results for input(s): GLUCAP in the last 168 hours. D-Dimer No results for input(s): DDIMER in the last 72 hours. Hgb A1c No results for input(s): HGBA1C in the last 72 hours. Lipid Profile No results for input(s): CHOL, HDL, LDLCALC, TRIG, CHOLHDL, LDLDIRECT in the last 72 hours. Thyroid function studies No results for input(s): TSH, T4TOTAL, T3FREE, THYROIDAB in the last 72 hours.  Invalid input(s): FREET3 Anemia work up No results for input(s): VITAMINB12, FOLATE, FERRITIN, TIBC, IRON, RETICCTPCT in the last 72 hours. Urinalysis    Component Value Date/Time   COLORURINE YELLOW 04/05/2019 2113   APPEARANCEUR CLEAR 04/05/2019 2113   LABSPEC 1.020 04/05/2019 2113   PHURINE 5.0 04/05/2019 2113   GLUCOSEU 50 (A) 04/05/2019 2113   HGBUR SMALL (A) 04/05/2019 2113   BILIRUBINUR NEGATIVE 04/05/2019 2113   KETONESUR NEGATIVE 04/05/2019 2113   PROTEINUR NEGATIVE 04/05/2019 2113   NITRITE NEGATIVE 04/05/2019 2113   LEUKOCYTESUR NEGATIVE 04/05/2019 2113   Sepsis Labs Invalid input(s): PROCALCITONIN,  WBC,  LACTICIDVEN Microbiology Recent Results (from the past 240 hour(s))  Urine culture     Status: None   Collection Time: 04/05/19  9:13 PM   Specimen: Urine, Random  Result Value Ref Range Status   Specimen Description   Final    URINE, RANDOM Performed at [facility], [address] [place], [place] [address]    Special Requests   Final    NONE Performed at [facility], [address],  [place], [place] [address]    Culture   Final    NO GROWTH Performed at [facility], [address] [place], [place] [address]    Report Status 04/07/2019 FINAL  Final  SARS CORONAVIRUS 2 (TAT 6-24 HRS) Nasopharyngeal Nasopharyngeal Swab  Status: None   Collection Time: 04/05/19 10:58 PM   Specimen: Nasopharyngeal Swab  Result Value Ref Range Status   SARS Coronavirus 2 NEGATIVE NEGATIVE Final    Comment: (NOTE) SARS-CoV-2 target nucleic acids are NOT DETECTED. The SARS-CoV-2 RNA is generally detectable in upper and lower respiratory specimens during the acute phase of infection. Negative results do not preclude SARS-CoV-2 infection, do not rule out co-infections with other pathogens, and should not be used as the sole basis for treatment or other patient management decisions. Negative results must be combined with clinical observations, patient history, and epidemiological information. The expected result is Negative. Fact Sheet for Patients: HairSlick.no Fact Sheet for Healthcare Providers: quierodirigir.com This test is not yet approved or cleared by the [place] FDA and  has been authorized for detection and/or diagnosis of SARS-CoV-2 by FDA under an Emergency Use Authorization (EUA). This EUA will remain  in effect (meaning this test can be used) for the duration of the COVID-19 declaration under Section 56 4(b)(1) of the Act, 21 U.S.C. section 360bbb-3(b)(1), unless the authorization is terminated or revoked sooner. Performed at [facility], [address], [place], [place] [address]     Discharge Instructions     Discharge Instructions    Diet - low sodium heart healthy   Complete by: As directed    Discharge instructions   Complete by: As directed    You were seen and examined in the hospital for suspected kidney stone and cared for by a hospitalist and urologist  Upon Discharge:  -Hold  your Valsartan (Diovan) and Colcrys for another 2-3 days  -Get lab work (CBC and BMP to follow up on your blood counts and kidney function) and follow up with your primary care physician this week  -Take Tylenol every 4-6 hours as needed for pain   Bring all home medications to your appointment to review Request that your primary physician go over all hospital tests and procedures/radiological results at the follow up.   Please get all hospital records sent to your physician by signing a hospital release before you go home.     Read the complete instructions along with all the possible side effects for all the medicines you take and that have been prescribed to you. Take any new medicines after you have completely understood and accept all the possible adverse reactions/side effects.   If you have any questions about your discharge medications or the care you received while you were in the hospital, you can call the unit and asked to speak with the hospitalist on call. Once you are discharged, your primary care physician will handle any further medical issues. Please note that NO REFILLS for any discharge medications will be authorized, as it is imperative that you return to your primary care physician (or establish a relationship with a primary care physician if you do not have one) for your aftercare needs so that they can reassess your need for medications and monitor your lab values.   Do not drive, operate heavy machinery, perform activities at heights, swimming or participation in water activities or provide baby sitting services if your were admitted for loss of consciousness/seizures or if you are on sedating medications including, but not limited to benzodiazepines, sleep medications, narcotic pain medications, etc., until you have been cleared to do so by a medical doctor.   Do not take more than prescribed medications.   Wear a seat belt while driving.  If you have smoked or  chewed  Tobacco in the last 2 years please stop smoking; also stop any regular Alcohol and/or any Recreational drug use including marijuana.  If you experience worsening of your admission symptoms or develop shortness of breath, chest pain, suicidal or homicidal thoughts or experience a life threatening emergency, you must seek medical attention immediately by calling 911 or calling your PCP immediately.   Discharge instructions   Complete by: As directed    You are being prescribed Norco (Hydrocodone-acetaminophen) for Severe Pain. You can take this medication every 6 hours as needed for SEVERE pain only. Do not drink alcholol, take clonazepam, drive, or operate heavy machinery while taking this medication   Increase activity slowly   Complete by: As directed      Allergies as of 04/07/2019      Reactions   Sulfonamide Derivatives    Tegretol [carbamazepine] Hives      Medication List    TAKE these medications   acetaminophen 500 MG tablet Commonly known as: TYLENOL Take 1 tablet (500 mg total) by mouth every 6 (six) hours as needed for mild pain, moderate pain or fever.   atorvastatin 20 MG tablet Commonly known as: LIPITOR Take 20 mg by mouth daily.   Colcrys 0.6 MG tablet Generic drug: colchicine TAKE 2 TABLETS (1.2 MG TOTAL) BY MOUTH 2 (TWO) TIMES DAILY. What changed: See the new instructions.   Dexilant 60 MG capsule Generic drug: dexlansoprazole TAKE 1 CAPSULE (60 MG TOTAL) BY MOUTH 2 (TWO) TIMES DAILY. What changed: See the new instructions.   DULoxetine 60 MG capsule Commonly known as: CYMBALTA Take 60 mg by mouth daily.   dutasteride 0.5 MG capsule Commonly known as: Avodart Take 1 capsule (0.5 mg total) by mouth every other day.   gabapentin 300 MG capsule Commonly known as: NEURONTIN Take 1 capsule (300 mg total) by mouth 3 (three) times daily.   HYDROcodone-acetaminophen 5-325 MG tablet Commonly known as: NORCO/VICODIN Take 1 tablet by mouth every 6 (six)  hours as needed for up to 3 days for severe pain.   probenecid 500 MG tablet Commonly known as: BENEMID Take 1 tablet (500 mg total) by mouth daily.   tamsulosin 0.4 MG Caps capsule Commonly known as: FLOMAX TAKE 1 CAPSULE (0.4 MG TOTAL) BY MOUTH DAILY. What changed: See the new instructions.   valsartan 40 MG tablet Commonly known as: DIOVAN Take 40 mg by mouth daily.       Allergies  Allergen Reactions  . Sulfonamide Derivatives   . Tegretol [Carbamazepine] Hives    Time coordinating discharge: Over 30 minutes   SIGNED:   Jae DireJared E Burhan Barham, D.O. [facility] Pager: [phone]  04/07/2019, 9:02 AM

## 2019-04-07 NOTE — Progress Notes (Signed)
Pt stable at time of d/c. No needs or pain to report. No questions on d/c instructions or education.

## 2019-04-07 NOTE — Discharge Instructions (Signed)
Acute Kidney Injury, Adult  Acute kidney injury is a sudden worsening of kidney function. The kidneys are organs that have several jobs. They filter the blood to remove waste products and extra fluid. They also maintain a healthy balance of minerals and hormones in the body, which helps control blood pressure and keep bones strong. With this condition, your kidneys do not do their jobs as well as they should. This condition ranges from mild to severe. Over time it may develop into long-lasting (chronic) kidney disease. Early detection and treatment may prevent acute kidney injury from developing into a chronic condition. What are the causes? Common causes of this condition include:  A problem with blood flow to the kidneys. This may be caused by: ? Low blood pressure (hypotension) or shock. ? Blood loss. ? Heart and blood vessel (cardiovascular) disease. ? Severe burns. ? Liver disease.  Direct damage to the kidneys. This may be caused by: ? Certain medicines. ? A kidney infection. ? Poisoning. ? Being around or in contact with toxic substances. ? A surgical wound. ? A hard, direct hit to the kidney area.  A sudden blockage of urine flow. This may be caused by: ? Cancer. ? Kidney stones. ? An enlarged prostate in males. What are the signs or symptoms? Symptoms of this condition may not be obvious until the condition becomes severe. Symptoms of this condition can include:  Tiredness (lethargy), or difficulty staying awake.  Nausea or vomiting.  Swelling (edema) of the face, legs, ankles, or feet.  Problems with urination, such as: ? Abdominal pain, or pain along the side of your stomach (flank). ? Decreased urine production. ? Decrease in the force of urine flow.  Muscle twitches and cramps, especially in the legs.  Confusion or trouble concentrating.  Loss of appetite.  Fever. How is this diagnosed? This condition may be diagnosed with tests, including:  Blood  tests.  Urine tests.  Imaging tests.  A test in which a sample of tissue is removed from the kidneys to be examined under a microscope (kidney biopsy). How is this treated? Treatment for this condition depends on the cause and how severe the condition is. In mild cases, treatment may not be needed. The kidneys may heal on their own. In more severe cases, treatment will involve:  Treating the cause of the kidney injury. This may involve changing any medicines you are taking or adjusting your dosage.  Fluids. You may need specialized IV fluids to balance your body's needs.  Having a catheter placed to drain urine and prevent blockages.  Preventing problems from occurring. This may mean avoiding certain medicines or procedures that can cause further injury to the kidneys. In some cases treatment may also require:  A procedure to remove toxic wastes from the body (dialysis or continuous renal replacement therapy - CRRT).  Surgery. This may be done to repair a torn kidney, or to remove the blockage from the urinary system. Follow these instructions at home: Medicines  Take over-the-counter and prescription medicines only as told by your health care provider.  Do not take any new medicines without your health care provider's approval. Many medicines can worsen your kidney damage.  Do not take any vitamin and mineral supplements without your health care provider's approval. Many nutritional supplements can worsen your kidney damage. Lifestyle  If your health care provider prescribed changes to your diet, follow them. You may need to decrease the amount of protein you eat.  Achieve and maintain a healthy   weight. If you need help with this, ask your health care provider.  Start or continue an exercise plan. Try to exercise at least 30 minutes a day, 5 days a week.  Do not use any tobacco products, such as cigarettes, chewing tobacco, and e-cigarettes. If you need help quitting, ask your  health care provider. General instructions  Keep track of your blood pressure. Report changes in your blood pressure as told by your health care provider.  Stay up to date with immunizations. Ask your health care provider which immunizations you need.  Keep all follow-up visits as told by your health care provider. This is important. Where to find more information  American Association of Kidney Patients: ResidentialShow.is  [employer]: www.kidney.org  [employer]: FightingMatch.com.ee  [facility]: ? www.lifeoptions.org ? www.kidneyschool.org Contact a health care provider if:  Your symptoms get worse.  You develop new symptoms. Get help right away if:  You develop symptoms of worsening kidney disease, which include: ? Headaches. ? Abnormally dark or light skin. ? Easy bruising. ? Frequent hiccups. ? Chest pain. ? Shortness of breath. ? End of menstruation in women. ? Seizures. ? Confusion or altered mental status. ? Abdominal or back pain. ? Itchiness.  You have a fever.  Your body is producing less urine.  You have pain or bleeding when you urinate. Summary  Acute kidney injury is a sudden worsening of kidney function.  Acute kidney injury can be caused by problems with blood flow to the kidneys, direct damage to the kidneys, and sudden blockage of urine flow.  Symptoms of this condition may not be obvious until it becomes severe. Symptoms may include edema, lethargy, confusion, nausea or vomiting, and problems passing urine.  This condition can usually be diagnosed with blood tests, urine tests, and imaging tests. Sometimes a kidney biopsy is done to diagnose this condition.  Treatment for this condition often involves treating the underlying cause. It is treated with fluids, medicines, dialysis, diet changes, or surgery. This information is not intended to replace advice given to you by your health care provider. Make  sure you discuss any questions you have with your health care provider. Document Released: 10/25/2010 Document Revised: 03/24/2017 Document Reviewed: 04/01/2016 Elsevier Patient Education  2020 Elsevier Inc.   Pyelonephritis, Adult  Pyelonephritis is an infection that occurs in the kidney. The kidneys are organs that help clean the blood by moving waste out of the blood and into the pee (urine). This infection can happen quickly, or it can last for a long time. In most cases, it clears up with treatment and does not cause other problems. What are the causes? This condition may be caused by:  Germs (bacteria) going from the bladder up to the kidney. This may happen after having a bladder infection.  Germs going from the blood to the kidney. What increases the risk? This condition is more likely to develop in:  Pregnant women.  Older people.  People who have any of these conditions: ? Diabetes. ? Inflammation of the prostate gland (prostatitis), in males. ? Kidney stones or bladder stones. ? Other problems with the kidney or the parts of your body that carry pee from the kidneys to the bladder (ureters). ? Cancer.  People who have a small, thin tube (catheter) placed in the bladder.  People who are sexually active.  Women who use a medicine that kills sperm (spermicide) to prevent pregnancy.  People who have had a prior urinary tract  infection (UTI). What are the signs or symptoms? Symptoms of this condition include:  Peeing often.  A strong urge to pee right away.  Burning or stinging when peeing.  Belly pain.  Back pain.  Pain in the side (flank area).  Fever or chills.  Blood in the pee, or dark pee.  Feeling sick to your stomach (nauseous) or throwing up (vomiting). How is this treated? This condition may be treated by:  Taking antibiotic medicines by mouth (orally).  Drinking enough fluids. If the infection is bad, you may need to stay in the hospital.  You may be given antibiotics and fluids that are put directly into a vein through an IV tube. In some cases, other treatments may be needed. Follow these instructions at home: Medicines  Take your antibiotic medicine as told by your doctor. Do not stop taking the antibiotic even if you start to feel better.  Take over-the-counter and prescription medicines only as told by your doctor. General instructions   Drink enough fluid to keep your pee pale yellow.  Avoid caffeine, tea, and carbonated drinks.  Pee (urinate) often. Avoid holding in pee for long periods of time.  Pee before and after sex.  After pooping (having a bowel movement), women should wipe from front to back. Use each tissue only once.  Keep all follow-up visits as told by your doctor. This is important. Contact a doctor if:  You do not feel better after 2 days.  Your symptoms get worse.  You have a fever. Get help right away if:  You cannot take your medicine or drink fluids as told.  You have chills and shaking.  You throw up.  You have very bad pain in your side or back.  You feel very weak or you pass out (faint). Summary  Pyelonephritis is an infection that occurs in the kidney.  In most cases, this infection clears up with treatment and does not cause other problems.  Take your antibiotic medicine as told by your doctor. Do not stop taking the antibiotic even if you start to feel better.  Drink enough fluid to keep your pee pale yellow. This information is not intended to replace advice given to you by your health care provider. Make sure you discuss any questions you have with your health care provider. Document Released: 05/19/2004 Document Revised: 02/13/2018 Document Reviewed: 02/13/2018 Elsevier Patient Education  2020 [employer].

## 2019-04-07 NOTE — Progress Notes (Signed)
Patient ID: Miguel Williams, male   DOB: 12-09-1945, 73 y.o.   MRN: [id number]    Subjective: Patient feeling much improved.  No pain since 3 PM yesterday.  He has not seen a stone pass despite straining his urine.  No nausea or vomiting.  No fever.  Objective: Vital signs in last 24 hours: Temp:  [98 F (36.7 C)-98.3 F (36.8 C)] 98.3 F (36.8 C) (12/13 0502) Pulse Rate:  [80-98] 80 (12/13 0502) Resp:  [16-18] 16 (12/13 0502) BP: (121-148)/(70-88) 121/70 (12/13 0502) SpO2:  [94 %-98 %] 98 % (12/13 0502)  Intake/Output from previous day: 12/12 0701 - 12/13 0700 In: 2144.2 [P.O.:2020; I.V.:24.2; IV Piggyback:100] Out: 2500 [Urine:2500] Intake/Output this shift: No intake/output data recorded.  Physical Exam:  General: Alert and oriented Abdomen: Soft, ND, nontender, no CVA tenderness  Lab Results: Recent Labs    04/05/19 2113 04/06/19 0233 04/07/19 0452  HGB 15.5 14.0 12.0*  HCT 45.8 42.4 36.8*   CBC Latest Ref Rng & Units 04/07/2019 04/06/2019 04/05/2019  WBC 4.0 - 10.5 K/uL 6.8 12.6(H) 16.7(H)  Hemoglobin 13.0 - 17.0 g/dL 12.0(L) 14.0 15.5  Hematocrit 39.0 - 52.0 % 36.8(L) 42.4 45.8  Platelets 150 - 400 K/uL 179 196 232     BMET Recent Labs    04/06/19 0233 04/07/19 0452  NA 142  141 140  K 4.7  5.3* 4.4  CL 108  108 108  CO2 14*  23 23  GLUCOSE 112*  135* 121*  BUN 52*  46* 41*  CREATININE 2.14*  2.03* 1.79*  CALCIUM 9.1  8.7* 8.4*     Studies/Results: CT ABDOMEN PELVIS WO CONTRAST  Result Date: 04/05/2019 CLINICAL DATA:  Right lower quadrant pain, appendicitis suspected EXAM: CT ABDOMEN AND PELVIS WITHOUT CONTRAST TECHNIQUE: Multidetector CT imaging of the abdomen and pelvis was performed following the standard protocol without IV contrast. COMPARISON:  Abdominal radiograph 04/05/2011 FINDINGS: Lower chest: Lung bases are clear. Normal heart size. No pericardial effusion. Hepatobiliary: No focal liver abnormality is seen. No gallstones,  gallbladder wall thickening, or biliary dilatation. Pancreas: Unremarkable. No pancreatic ductal dilatation or surrounding inflammatory changes. Spleen: Normal in size without focal abnormality. Adrenals/Urinary Tract: Normal adrenal glands. There is bilateral nonspecific perinephric stranding, asymmetrically increased on the right with some reactive free fluid in the posterior pararenal space. There is mild right hydroureteronephrosis without visible obstructing calculus. Punctate nonobstructing calculus seen interpolar right kidney however (2/32) no left urolithiasis. No visible or contour deforming renal lesions. The urinary bladder appears circumferentially thickened with perivesicular hazy stranding. Stomach/Bowel: Distal esophagus, stomach and duodenal sweep are unremarkable. No small bowel wall thickening or dilatation. No evidence of obstruction. A normal appendix is visualized. No colonic dilatation or wall thickening. Scattered colonic diverticula without focal pericolonic inflammation to suggest diverticulitis. Vascular/Lymphatic: Atherosclerotic plaque within the normal caliber aorta. Some reactive retroperitoneal adenopathy is seen. Reproductive: The prostate and seminal vesicles are unremarkable. Other: No abdominopelvic free fluid or free gas. No bowel containing hernias. Small fat containing umbilical hernia. Mild posterior body wall edema. Musculoskeletal: Dextrocurvature of the upper lumbar spine apex at L1-2. Multilevel degenerative changes are present in the imaged portions of the spine. Most pronounced discogenic change noted at L2-3 and L5-S1. Mild retrolisthesis of L2 on 3 is noted. IMPRESSION: 1. There is mild right hydroureteronephrosis without visible obstructing calculus. There is bilateral nonspecific perinephric stranding, asymmetrically increased on the right with some reactive free fluid in the posterior pararenal space. Findings may be secondary to a recently  passed stone, however,  recommend correlation with urinalysis to exclude an ascending urinary tract infection. 2. Punctate nonobstructing right renal calculus. 3. Colonic diverticulosis without evidence for diverticulitis. 4. Small fat containing umbilical hernia. 5. Aortic Atherosclerosis (ICD10-I70.0). 6. 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 Electronically Signed   By: Kreg Shropshire M.D.   On: 04/05/2019 22:47    Assessment/Plan: 1.  Probable distal right ureteral calculus: Despite the radiology report, I do think that he likely had a small punctate distal right ureteral calculus is the most likely cause for his symptoms yesterday.  He has likely either passed his stone or simply has had relief of his pain in the meantime.  Regardless, his renal function is improving and his white blood count has normalized without evidence to suggest infection.  He is stable for discharge with pain medication from a urologic standpoint.  I do not think he needs to continue antibiotic therapy.  I will arrange outpatient urologic follow-up in the next 2 to 3 weeks for a renal ultrasound to ensure resolution of his hydronephrosis.  He has been instructed to call should he develop uncontrolled pain, fever, or persistent nausea/vomiting.   LOS: 1 day   Miguel Williams 04/07/2019, 8:51 AM

## 2019-04-07 NOTE — Plan of Care (Signed)
Pt stable at this time. No needs at time of assessment. Pt to d/c home after he eats breakfast.

## 2019-04-11 DIAGNOSIS — E7849 Other hyperlipidemia: Secondary | ICD-10-CM | POA: Diagnosis not present

## 2019-04-12 DIAGNOSIS — N400 Enlarged prostate without lower urinary tract symptoms: Secondary | ICD-10-CM | POA: Diagnosis not present

## 2019-04-12 DIAGNOSIS — E875 Hyperkalemia: Secondary | ICD-10-CM | POA: Insufficient documentation

## 2019-04-12 DIAGNOSIS — N1831 Chronic kidney disease, stage 3a: Secondary | ICD-10-CM | POA: Insufficient documentation

## 2019-04-12 DIAGNOSIS — F1099 Alcohol use, unspecified with unspecified alcohol-induced disorder: Secondary | ICD-10-CM | POA: Insufficient documentation

## 2019-04-12 DIAGNOSIS — N179 Acute kidney failure, unspecified: Secondary | ICD-10-CM | POA: Diagnosis not present

## 2019-04-12 DIAGNOSIS — N201 Calculus of ureter: Secondary | ICD-10-CM | POA: Diagnosis not present

## 2019-04-12 DIAGNOSIS — I129 Hypertensive chronic kidney disease with stage 1 through stage 4 chronic kidney disease, or unspecified chronic kidney disease: Secondary | ICD-10-CM | POA: Insufficient documentation

## 2019-04-24 DIAGNOSIS — N201 Calculus of ureter: Secondary | ICD-10-CM | POA: Diagnosis not present

## 2019-05-22 ENCOUNTER — Ambulatory Visit: Payer: Self-pay

## 2019-05-31 ENCOUNTER — Ambulatory Visit: Payer: BC Managed Care – PPO | Attending: Internal Medicine

## 2019-05-31 DIAGNOSIS — Z23 Encounter for immunization: Secondary | ICD-10-CM | POA: Insufficient documentation

## 2019-05-31 NOTE — Progress Notes (Signed)
   Covid-19 Vaccination Clinic  Name:  Miguel Williams    MRN: [id number] DOB: 1945/08/16  05/31/2019  Mr. Miguel Williams was observed post Covid-19 immunization for 15 minutes without incidence. He was provided with Vaccine Information Sheet and instruction to access the V-Safe system.   Mr. Miguel Williams was instructed to call 911 with any severe reactions post vaccine: Marland Kitchen Difficulty breathing  . Swelling of your face and throat  . A fast heartbeat  . A bad rash all over your body  . Dizziness and weakness    Immunizations Administered    Name Date Dose VIS Date Route   Pfizer COVID-19 Vaccine 05/31/2019  9:56 AM 0.3 mL 04/05/2019 Intramuscular   Manufacturer: [employer], [employer]   Lot: [id number]   NDC: [address]-2

## 2019-06-12 ENCOUNTER — Ambulatory Visit: Payer: Self-pay

## 2019-06-23 ENCOUNTER — Other Ambulatory Visit: Payer: Self-pay

## 2019-06-23 ENCOUNTER — Encounter (HOSPITAL_COMMUNITY): Payer: Self-pay

## 2019-06-23 ENCOUNTER — Emergency Department (HOSPITAL_COMMUNITY): Payer: BC Managed Care – PPO

## 2019-06-23 ENCOUNTER — Emergency Department (HOSPITAL_COMMUNITY)
Admission: EM | Admit: 2019-06-23 | Discharge: 2019-06-24 | Disposition: A | Discharge status: Home / Self Care | Payer: BC Managed Care – PPO | Attending: Emergency Medicine | Admitting: Emergency Medicine

## 2019-06-23 DIAGNOSIS — N132 Hydronephrosis with renal and ureteral calculous obstruction: Secondary | ICD-10-CM | POA: Diagnosis not present

## 2019-06-23 DIAGNOSIS — K5730 Diverticulosis of large intestine without perforation or abscess without bleeding: Secondary | ICD-10-CM | POA: Diagnosis not present

## 2019-06-23 DIAGNOSIS — N12 Tubulo-interstitial nephritis, not specified as acute or chronic: Secondary | ICD-10-CM

## 2019-06-23 DIAGNOSIS — R109 Unspecified abdominal pain: Secondary | ICD-10-CM | POA: Diagnosis not present

## 2019-06-23 DIAGNOSIS — J449 Chronic obstructive pulmonary disease, unspecified: Secondary | ICD-10-CM | POA: Diagnosis not present

## 2019-06-23 DIAGNOSIS — R911 Solitary pulmonary nodule: Secondary | ICD-10-CM | POA: Diagnosis not present

## 2019-06-23 DIAGNOSIS — N200 Calculus of kidney: Secondary | ICD-10-CM | POA: Insufficient documentation

## 2019-06-23 DIAGNOSIS — Z79899 Other long term (current) drug therapy: Secondary | ICD-10-CM | POA: Diagnosis not present

## 2019-06-23 DIAGNOSIS — N183 Chronic kidney disease, stage 3 unspecified: Secondary | ICD-10-CM | POA: Insufficient documentation

## 2019-06-23 DIAGNOSIS — N10 Acute tubulo-interstitial nephritis: Secondary | ICD-10-CM | POA: Diagnosis not present

## 2019-06-23 DIAGNOSIS — E512 Wernicke's encephalopathy: Secondary | ICD-10-CM | POA: Insufficient documentation

## 2019-06-23 HISTORY — DX: Personal history of COVID-19: Z86.16

## 2019-06-23 HISTORY — DX: Low back pain, unspecified: M54.50

## 2019-06-23 HISTORY — DX: Moderate protein-calorie malnutrition: E44.0

## 2019-06-23 HISTORY — DX: Atherosclerotic heart disease of native coronary artery without angina pectoris: I25.10

## 2019-06-23 HISTORY — DX: Chronic obstructive pulmonary disease, unspecified: J44.9

## 2019-06-23 HISTORY — DX: Idiopathic aseptic necrosis of right femur: M87.051

## 2019-06-23 HISTORY — DX: Hypokalemia: E87.6

## 2019-06-23 HISTORY — DX: Presence of left artificial hip joint: Z96.642

## 2019-06-23 HISTORY — DX: Iron deficiency anemia, unspecified: D50.9

## 2019-06-23 HISTORY — DX: Idiopathic aseptic necrosis of left femur: M87.052

## 2019-06-23 HISTORY — DX: Hypo-osmolality and hyponatremia: E87.1

## 2019-06-23 HISTORY — DX: Iron deficiency anemia secondary to blood loss (chronic): D50.0

## 2019-06-23 HISTORY — DX: Other fatigue: R53.83

## 2019-06-23 HISTORY — DX: Unspecified fall, subsequent encounter: W19.XXXD

## 2019-06-23 HISTORY — DX: Restlessness and agitation: R45.1

## 2019-06-23 HISTORY — DX: Vitamin D deficiency, unspecified: E55.9

## 2019-06-23 HISTORY — DX: Gastro-esophageal reflux disease without esophagitis: K21.9

## 2019-06-23 HISTORY — DX: Insomnia due to medical condition: G47.01

## 2019-06-23 HISTORY — DX: Pain in unspecified hip: M25.559

## 2019-06-23 HISTORY — DX: Allergy, unspecified, initial encounter: T78.40XA

## 2019-06-23 HISTORY — DX: Bipolar disorder, unspecified: F31.9

## 2019-06-23 HISTORY — DX: Alcohol abuse, uncomplicated: F10.10

## 2019-06-23 HISTORY — DX: Wernicke's encephalopathy: E51.2

## 2019-06-23 HISTORY — DX: Post-traumatic stress disorder, unspecified: F43.10

## 2019-06-23 HISTORY — DX: Chronic viral hepatitis C: B18.2

## 2019-06-23 HISTORY — DX: Emotional lability: R45.86

## 2019-06-23 HISTORY — DX: Lower abdominal pain, unspecified: R10.30

## 2019-06-23 HISTORY — DX: Chronic kidney disease, stage 3 unspecified: N18.30

## 2019-06-23 HISTORY — DX: Anxiety disorder, unspecified: F41.9

## 2019-06-23 HISTORY — DX: Unspecified mycosis: B49

## 2019-06-23 HISTORY — DX: Muscle wasting and atrophy, not elsewhere classified, unspecified site: M62.50

## 2019-06-23 HISTORY — DX: Cocaine abuse, uncomplicated: F14.10

## 2019-06-23 LAB — COMPREHENSIVE METABOLIC PANEL
ALT: 28 U/L (ref 0–44)
AST: 25 U/L (ref 15–41)
Albumin: 4.5 g/dL (ref 3.5–5.0)
Alkaline Phosphatase: 50 U/L (ref 38–126)
Anion gap: 12 (ref 5–15)
BUN: 40 mg/dL — ABNORMAL HIGH (ref 8–23)
CO2: 20 mmol/L — ABNORMAL LOW (ref 22–32)
Calcium: 9.2 mg/dL (ref 8.9–10.3)
Chloride: 106 mmol/L (ref 98–111)
Creatinine, Ser: 2.06 mg/dL — ABNORMAL HIGH (ref 0.61–1.24)
GFR calc Af Amer: 36 mL/min — ABNORMAL LOW (ref >60)
GFR calc non Af Amer: 31 mL/min — ABNORMAL LOW (ref >60)
Glucose, Bld: 144 mg/dL — ABNORMAL HIGH (ref 70–99)
Potassium: 4.6 mmol/L (ref 3.5–5.1)
Sodium: 138 mmol/L (ref 135–145)
Total Bilirubin: 0.5 mg/dL (ref 0.3–1.2)
Total Protein: 7.3 g/dL (ref 6.5–8.1)

## 2019-06-23 LAB — URINALYSIS, ROUTINE W REFLEX MICROSCOPIC
Bilirubin Urine: NEGATIVE
Glucose, UA: 50 mg/dL — AB
Ketones, ur: NEGATIVE mg/dL
Leukocytes,Ua: NEGATIVE
Nitrite: NEGATIVE
Protein, ur: NEGATIVE mg/dL
RBC / HPF: >50 RBC/hpf — ABNORMAL HIGH (ref 0–5)
Specific Gravity, Urine: 1.018 (ref 1.005–1.030)
pH: 5 (ref 5.0–8.0)

## 2019-06-23 LAB — CBC
HCT: 41.8 % (ref 39.0–52.0)
Hemoglobin: 14 g/dL (ref 13.0–17.0)
MCH: 31.9 pg (ref 26.0–34.0)
MCHC: 33.5 g/dL (ref 30.0–36.0)
MCV: 95.2 fL (ref 80.0–100.0)
Platelets: 208 10*3/uL (ref 150–400)
RBC: 4.39 MIL/uL (ref 4.22–5.81)
RDW: 12.5 % (ref 11.5–15.5)
WBC: 12.4 10*3/uL — ABNORMAL HIGH (ref 4.0–10.5)
nRBC: 0 % (ref 0.0–0.2)

## 2019-06-23 LAB — LIPASE, BLOOD: Lipase: 34 U/L (ref 11–51)

## 2019-06-23 MED ORDER — SODIUM CHLORIDE 0.9% FLUSH
3.0000 mL | Freq: Once | INTRAVENOUS | Status: AC
  Administered 2019-06-23: 22:00:00 3 mL via INTRAVENOUS

## 2019-06-23 MED ORDER — SODIUM CHLORIDE 0.9 % IV SOLN
1.0000 g | Freq: Once | INTRAVENOUS | Status: AC
  Administered 2019-06-24: 1 g via INTRAVENOUS
  Filled 2019-06-23: qty 10

## 2019-06-23 MED ORDER — MORPHINE SULFATE (PF) 4 MG/ML IV SOLN
4.0000 mg | Freq: Once | INTRAVENOUS | Status: AC
  Administered 2019-06-23: 4 mg via INTRAVENOUS
  Filled 2019-06-23: qty 1

## 2019-06-23 MED ORDER — SODIUM CHLORIDE 0.9 % IV BOLUS
1000.0000 mL | Freq: Once | INTRAVENOUS | Status: AC
  Administered 2019-06-23: 1000 mL via INTRAVENOUS

## 2019-06-23 NOTE — ED Triage Notes (Signed)
Pt reports diffuse lower abdominal pain that started at 6pm. He denies vomiting, but endorses some nausea. States that he was eating when it started. A&Ox4. Ambulatory.

## 2019-06-23 NOTE — ED Notes (Signed)
Urine culture sent to the lab. 

## 2019-06-23 NOTE — ED Provider Notes (Signed)
[facility]-EMERGENCY DEPT Provider Note   CSN: [id number] Arrival date & time: 06/23/19  2111     History Chief Complaint  Patient presents with  . Abdominal Pain    Miguel Williams is a 74 y.o. male history of kidney stones, diverticulosis, presented to emergency department with lower abdominal pain.  Reports abrupt onset of pain while eating dinner around 5 PM this evening.  It is diffuse cramping pain across his lower abdomen.  It has been constant since then.  He had a regular nonbloody bowel movement afterwards.  Is also been urinating normally without dysuria hematuria.  Reports he passed a kidney stone approximately 3 weeks ago.  He denies any recent history of diverticulitis.  He denies any fevers or chills.  He reports nausea but denies vomiting.  He denies any abdominal surgical history.  Per medical chart review, patient was briefly hospitalized for pyelonephritis in Dec 2020 for 2 days of antibiotics, although his urine culture was ultimately negative at that time.  There was questionable kidney stone at that time.  HPI     Past Medical History:  Diagnosis Date  . Abdominal pain, lower   . Agitation   . Alcohol abuse, continuous   . Allergy, unspecified, initial encounter   . Anxiety disorder   . Bipolar 1 disorder (HCC)   . 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 Chronic kidney disease, stage III (moderate)   . Chronic kidney disease, stage III (moderate)   . Cocaine abuse, continuous (HCC)   . COPD (chronic obstructive pulmonary disease) (HCC)   . Coronary atherosclerosis of native coronary artery   . Esophageal reflux   . Fatigue   . Fungal granuloma   . Hip pain   . History of 2019 novel coronavirus disease (COVID-19)   . History of endoscopy 02/00/2011  . Hypo-osmolality and hyponatremia   . Hypokalemia   . 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 Insomnia due to  medical condition   . Iron deficiency anemia secondary to blood loss (chronic)   . Iron deficiency anemia, unspecified   . Lability emotional   . Low back pain   . Moderate protein-calorie malnutrition (HCC)   . Muscle wasting   . Posttraumatic stress disorder   . Presence of left artificial hip joint   . Restlessness   . Unspecified fall, subsequent encounter   . Vitamin D deficiency, unspecified   . Wernicke's encephalopathy 06/23/2019    Patient Active Problem List   Diagnosis Date Noted  . Wernicke's encephalopathy 06/23/2019  . Chronic kidney disease, stage III (moderate)   . Pyelonephritis 04/05/2019  . AKI (acute kidney injury) (HCC) 04/05/2019  . Ureterolithiasis 04/05/2019  . Pain, dental 01/01/2013  . Health maintenance examination 03/30/2011  . Abnormal prostate exam 03/08/2011  . Abnormal prostate specific antigen 03/08/2011  . GASTROESOPHAGEAL REFLUX DISEASE, CHRONIC 12/24/2008  . ACUTE BRONCHITIS 12/16/2008  . ASTHMA UNSPECIFIED WITH EXACERBATION 12/16/2008  . GANGLION CYST, TENDON SHEATH 06/27/2008  . FOOT PAIN, LEFT 05/23/2008  . ADVERSE DRUG REACTION 03/10/2008  . GOUT 03/08/2007  . 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 ELEVATED BP W/O HYPERTENSION 03/08/2007  . INSOMNIA, CHRONIC, MILD 10/02/2006  . CERUMEN IMPACTION, BILATERAL 10/02/2006    Past Surgical History:  Procedure Laterality Date  . CATARACT EXTRACTION  2012  . COLONOSCOPY W/ ENDOSCOPIC [place]    . TONSILLECTOMY  1952  Family History  Problem Relation Age of Onset  . Lung cancer Father     Social History   Tobacco Use  . Smoking status: Never Smoker  . Smokeless tobacco: Never Used  Substance Use Topics  . Alcohol use: Yes    Alcohol/week: 7.0 standard drinks    Types: 7 Glasses of wine per week  . Drug use: No    Home Medications Prior to Admission medications   Medication Sig Start Date End Date Taking? Authorizing Provider  acetaminophen (TYLENOL) 500 MG tablet Take  1 tablet (500 mg total) by mouth every 6 (six) hours as needed for mild pain, moderate pain or fever. 04/07/19   Harold Hedge, MD  atorvastatin (LIPITOR) 20 MG tablet Take 20 mg by mouth daily. 03/18/19   [provider]  cephALEXin (KEFLEX) 500 MG capsule Take 1 capsule (500 mg total) by mouth 3 (three) times daily for 10 days. 06/24/19 07/04/19  Wyvonnia Dusky, MD  COLCRYS 0.6 MG tablet TAKE 2 TABLETS (1.2 MG TOTAL) BY MOUTH 2 (TWO) TIMES DAILY. Patient taking differently: Take 1.2 mg by mouth 2 (two) times daily.  04/04/12   Stefanie Libel, MD  DEXILANT 60 MG capsule TAKE 1 CAPSULE (60 MG TOTAL) BY MOUTH 2 (TWO) TIMES DAILY. Patient taking differently: Take 60 mg by mouth 2 (two) times daily.  04/04/12   Stefanie Libel, MD  DULoxetine (CYMBALTA) 60 MG capsule Take 60 mg by mouth daily. 03/18/19   [provider]  dutasteride (AVODART) 0.5 MG capsule Take 1 capsule (0.5 mg total) by mouth every other day. 02/12/13   Stefanie Libel, MD  gabapentin (NEURONTIN) 300 MG capsule Take 1 capsule (300 mg total) by mouth 3 (three) times daily. 02/04/13   Penumalli, Earlean Polka, MD  oxyCODONE-acetaminophen (PERCOCET/ROXICET) 5-325 MG tablet Take 2 tablets by mouth every 6 (six) hours as needed for up to 10 doses for severe pain. 06/24/19   Wyvonnia Dusky, MD  probenecid (BENEMID) 500 MG tablet Take 1 tablet (500 mg total) by mouth daily. 02/12/13   Stefanie Libel, MD  senna-docusate (SENOKOT-S) 8.6-50 MG tablet Take 1 tablet by mouth daily for 15 days. 06/24/19 07/09/19  Wyvonnia Dusky, MD  Tamsulosin HCl (FLOMAX) 0.4 MG CAPS TAKE 1 CAPSULE (0.4 MG TOTAL) BY MOUTH DAILY. Patient taking differently: Take 0.4 mg by mouth daily.  04/04/12   Stefanie Libel, MD  valsartan (DIOVAN) 40 MG tablet Take 40 mg by mouth daily. 03/19/19   [provider]    Allergies    Sulfonamide derivatives and Tegretol [carbamazepine]  Review of Systems   Review of Systems  Constitutional: Negative for chills  and fever.  Respiratory: Negative for cough and shortness of breath.   Cardiovascular: Negative for chest pain and palpitations.  Gastrointestinal: Positive for abdominal pain and nausea. Negative for constipation and vomiting.  Genitourinary: Negative for difficulty urinating, dysuria, flank pain and hematuria.  Musculoskeletal: Negative for arthralgias and back pain.  Skin: Negative for color change and rash.  Neurological: Negative for syncope and numbness.  All other systems reviewed and are negative.   Physical Exam Updated Vital Signs BP (!) 175/99   Pulse 81   Temp 98.1 F (36.7 C) (Oral)   Resp 17   Ht 5\' 8"  (1.727 m)   Wt 83.9 kg   SpO2 95%   BMI 28.13 kg/m   Physical Exam Vitals and nursing note reviewed.  Constitutional:      Appearance: He is well-developed.  HENT:     Head: Normocephalic and atraumatic.  Eyes:     Conjunctiva/sclera: Conjunctivae normal.  Cardiovascular:     Rate and Rhythm: Normal rate and regular rhythm.     Heart sounds: No murmur.  Pulmonary:     Effort: Pulmonary effort is normal. No respiratory distress.     Breath sounds: Normal breath sounds.  Abdominal:     Palpations: Abdomen is soft.     Tenderness: There is abdominal tenderness in the suprapubic area and left lower quadrant. There is no right CVA tenderness, left CVA tenderness, guarding or rebound. Negative signs include Murphy's sign and McBurney's sign.     Comments: Mild  Musculoskeletal:     Cervical back: Neck supple.  Skin:    General: Skin is warm and dry.  Neurological:     General: No focal deficit present.     Mental Status: He is alert and oriented to person, place, and time.     ED Results / Procedures / Treatments   Labs (all labs ordered are listed, but only abnormal results are displayed) Labs Reviewed  COMPREHENSIVE METABOLIC PANEL - Abnormal; Notable for the following components:      Result Value   CO2 20 (*)    Glucose, Bld 144 (*)    BUN 40 (*)     Creatinine, Ser 2.06 (*)    GFR calc non Af Amer 31 (*)    GFR calc Af Amer 36 (*)    All other components within normal limits  CBC - Abnormal; Notable for the following components:   WBC 12.4 (*)    All other components within normal limits  URINALYSIS, ROUTINE W REFLEX MICROSCOPIC - Abnormal; Notable for the following components:   Glucose, UA 50 (*)    Hgb urine dipstick SMALL (*)    RBC / HPF >50 (*)    Bacteria, UA RARE (*)    All other components within normal limits  URINE CULTURE  LIPASE, BLOOD    EKG None  Radiology CT ABDOMEN PELVIS WO CONTRAST  Result Date: 06/23/2019 CLINICAL DATA:  74 year old male with abdominal pain. Concern for diverticulitis. EXAM: CT ABDOMEN AND PELVIS WITHOUT CONTRAST TECHNIQUE: Multidetector CT imaging of the abdomen and pelvis was performed following the standard protocol without IV contrast. COMPARISON:  CT abdomen pelvis dated 04/05/2019. FINDINGS: Evaluation of this exam is limited in the absence of intravenous contrast. Lower chest: Minimal bibasilar linear atelectasis. Partially visualized 3 mm nodule in the lingula (series 4, image 1). The visualized lung bases are otherwise clear. No intra-abdominal free air or free fluid. Hepatobiliary: The liver is unremarkable. No intrahepatic biliary ductal dilatation. No calcified gallstone. Pancreas: Unremarkable. No pancreatic ductal dilatation or surrounding inflammatory changes. Spleen: Normal in size without focal abnormality. Adrenals/Urinary Tract: The adrenal glands are unremarkable. There is a 2 mm stone along the posterior wall the urinary bladder adjacent to the right ureterovesical junction. This may represent a recently passed right renal calculus versus a right UVJ stone. There is mild right hydronephrosis. Several small additional nonobstructing bilateral renal calculi measure up to 2-3 mm. There is no hydronephrosis on the left. There is right perinephric stranding. Correlation with  urinalysis recommended to exclude superimposed UTI. There is mild trabeculated appearance of the bladder wall. Faint density along the posterior bladder wall may represent debris or additional recently passed calculi. A bladder mass is not excluded. Clinical correlation is recommended. If there is clinical concern for bladder lesion further evaluation with cystoscopy  is advised. Stomach/Bowel: There is small amount of ingested content within the distal esophagus which may represent reflux or delayed esophageal clearance. There is sigmoid diverticulosis without active inflammatory changes. There is moderate stool throughout the colon. There is no bowel obstruction or active inflammation. The appendix is normal. Vascular/Lymphatic: Mild aortoiliac atherosclerotic disease. The IVC is unremarkable. No portal venous gas. There is no adenopathy. Reproductive: The prostate and seminal vesicles are grossly unremarkable. No pelvic mass. Other: Small fat containing umbilical hernia. Musculoskeletal: Degenerative changes of the spine. No acute osseous pathology. IMPRESSION: 1. A 2 mm recently passed right renal calculus versus a right UVJ stone with mild right hydronephrosis. Correlation with urinalysis recommended to exclude superimposed UTI. Additional small nonobstructing bilateral renal calculi noted. No hydronephrosis on the left. 2. Sigmoid diverticulosis. No bowel obstruction. Normal appendix. 3. A 3 mm partially visualized nodule in the lingula. No follow-up needed if patient is low-risk. Non-contrast chest CT can be considered in 12 months if patient is high-risk. This recommendation follows the consensus statement: Guidelines for Management of Incidental Pulmonary Nodules Detected on CT Images: From the Fleischner Society 2017; Radiology 2017; 284:228-243. 4. Aortic Atherosclerosis (ICD10-I70.0). Electronically Signed   By: Elgie Collard M.D.   On: 06/23/2019 23:27    Procedures Procedures (including critical  care time)  Medications Ordered in ED Medications  sodium chloride flush (NS) 0.9 % injection 3 mL (3 mLs Intravenous Given 06/23/19 2213)  sodium chloride 0.9 % bolus 1,000 mL (0 mLs Intravenous Stopped 06/24/19 0012)  morphine 4 MG/ML injection 4 mg (4 mg Intravenous Given 06/23/19 2335)  cefTRIAXone (ROCEPHIN) 1 g in sodium chloride 0.9 % 100 mL IVPB (1 g Intravenous New Bag/Given 06/24/19 0020)  HYDROmorphone (DILAUDID) injection 1 mg (1 mg Intravenous Given 06/24/19 0023)    ED Course  I have reviewed the triage vital signs and the nursing notes.  Pertinent labs & imaging results that were available during my care of the patient were reviewed by me and considered in my medical decision making (see chart for details).  74 yo male presenting to ED with abrupt onset lower abdominal pain around 5 pm today.  Here in ED he is afebrile with an overall reassuring abdominal exam, without significant focal tenderness, but some mild discomfort over the bladder and LLQ.  DDx included kidney stone vs diverticulitis vs constipation vs bowel obstruction vs other  CT scan (non contrast due to poor kidney function) showed likely recently-passed kidney stone, mild hydro, and some perinephric stranding.  He had nonspecific bilateral kidney stranding on his last CT (04/05/19) as well.  UA is not clearly convincing for an infection today, but has some bacteria.  He has a minor leukocytosis.    I discussed the findings with him.  I think it's reasonable to treat him for pyelo until his urine culture results.  We can give him a one-time dose of rocephin and send him home on keflex for 10 days, which his PCP can stop earlier if the cultures are negative.  We also discussed the possibility of constipation contributing to this, as well as the likelihood of ureteral spasm as a cause for his pain.     He is well appearing clinically, and I do not believe he warrants urgent hospitalization.  He can take PO at home, and I'll  prescribe him some pain medication.  He can f/u with his PCP in 2 days.  He is in agreement with this plan.  Clinical Course as of Jun 23 53  Mon Jun 24, 2019  0017 Will give another 1 mg dilaudid for pain.  Patient is well appearing.  We discussed his discharge plan - given his overall clinical picture, I think it's reasonable to discharge him home and have his PCP f/u on his urine culture, and discontinue antibiotics if it's negative.  Otherwise I do not think he needs hospitalization at this time.  He has a benign exam, and no evidence of bowel perforation or sepsis here.   [MT]  0018 I did advise the patient he should NOT be driving tonight after receiving opioids in the ED.   [MT]    Clinical Course User Index [MT] Tyah Acord, Kermit Balo, MD    Final Clinical Impression(s) / ED Diagnoses Final diagnoses:  Pyelonephritis  Kidney stone  Lung nodule    Rx / DC Orders ED Discharge Orders         Ordered    oxyCODONE-acetaminophen (PERCOCET/ROXICET) 5-325 MG tablet  Every 6 hours PRN     06/24/19 0009    senna-docusate (SENOKOT-S) 8.6-50 MG tablet  Daily     06/24/19 0009    cephALEXin (KEFLEX) 500 MG capsule  3 times daily     06/24/19 0009           Terald Sleeper, MD 06/24/19 [phone]

## 2019-06-24 LAB — URINE CULTURE: Culture: <10000 — AB

## 2019-06-24 MED ORDER — HYDROMORPHONE HCL 1 MG/ML IJ SOLN
1.0000 mg | Freq: Once | INTRAMUSCULAR | Status: AC
  Administered 2019-06-24: 1 mg via INTRAVENOUS
  Filled 2019-06-24: qty 1

## 2019-06-24 MED ORDER — CEPHALEXIN 500 MG PO CAPS: 500.0000 mg | ORAL_CAPSULE | Freq: Three times a day (TID) | ORAL | 0 refills | Status: AC

## 2019-06-24 MED ORDER — SENNOSIDES-DOCUSATE SODIUM 8.6-50 MG PO TABS: 1.0000 | ORAL_TABLET | Freq: Every day | ORAL | 0 refills | Status: AC

## 2019-06-24 MED ORDER — OXYCODONE-ACETAMINOPHEN 5-325 MG PO TABS: 2.0000 | ORAL_TABLET | Freq: Four times a day (QID) | ORAL | 0 refills | Status: DC | PRN

## 2019-06-24 NOTE — Discharge Instructions (Addendum)
It is likely that the pain you are experiencing is related to passing a small kidney stone.  The stone appears to have passed into your bladder.  Your pain should be improving.  We also noted on our CT scan that you have some swelling and stranding around your right kidney.  This may be a sign of a kidney infection, although it is not quite clear at this time.  We decided to start you on antibiotics we await your urine culture results.  If your urine culture results are negative, your doctor may wish to stop the antibiotics.  Please discuss this with your doctor in 2 days.  Please note that opioids like percocet and morphine can worsen constipation.  You may need to take a laxative or stool softener to help with bowel movements at home.  *  Finally, your CT scan picked up a small nodule in your lung.  This was an incidental finding, and can often be picked up on CT imaging.  In some settings, this could indicate cancer, particularly in smokers. The radiologist recommends follow up imaging as noted below.  Your primary care doctor should manage this.  A 3 mm partially visualized nodule in the lingula. No follow-up needed if patient is low-risk. Non-contrast chest CT can be considered in 12 months if patient is high-risk. This recommendation follows the consensus statement: Guidelines for Management of Incidental Pulmonary Nodules Detected on CT Images: From the Fleischner Society 2017; Radiology 2017; 284:228-243.

## 2019-06-25 ENCOUNTER — Ambulatory Visit: Payer: BC Managed Care – PPO | Attending: Internal Medicine

## 2019-06-25 DIAGNOSIS — Z23 Encounter for immunization: Secondary | ICD-10-CM | POA: Insufficient documentation

## 2019-06-25 NOTE — Progress Notes (Signed)
.  covid   Covid-19 Vaccination Clinic  Name:  Miguel Williams    MRN: [id number] DOB: 1946/03/06  06/25/2019  [name] was observed post Covid-19 immunization for 15 minutes without incident. He was provided with Vaccine Information Sheet and instruction to access the V-Safe system.   [name] was instructed to call 911 with any severe reactions post vaccine: Marland Kitchen Difficulty breathing  . Swelling of face and throat  . A fast heartbeat  . A bad rash all over body  . Dizziness and weakness   Immunizations Administered    Name Date Dose VIS Date Route   Pfizer COVID-19 Vaccine 06/25/2019 10:50 AM 0.3 mL 04/05/2019 Intramuscular   Manufacturer: [employer], [employer]   Lot: [id number]   NDC: [address]-2

## 2019-11-04 DIAGNOSIS — E7849 Other hyperlipidemia: Secondary | ICD-10-CM | POA: Diagnosis not present

## 2019-11-04 DIAGNOSIS — D539 Nutritional anemia, unspecified: Secondary | ICD-10-CM | POA: Diagnosis not present

## 2019-11-04 DIAGNOSIS — Z125 Encounter for screening for malignant neoplasm of prostate: Secondary | ICD-10-CM | POA: Diagnosis not present

## 2019-11-04 DIAGNOSIS — M109 Gout, unspecified: Secondary | ICD-10-CM | POA: Diagnosis not present

## 2019-11-04 DIAGNOSIS — E038 Other specified hypothyroidism: Secondary | ICD-10-CM | POA: Diagnosis not present

## 2019-11-04 DIAGNOSIS — E291 Testicular hypofunction: Secondary | ICD-10-CM | POA: Diagnosis not present

## 2019-11-04 DIAGNOSIS — R7301 Impaired fasting glucose: Secondary | ICD-10-CM | POA: Diagnosis not present

## 2019-11-04 DIAGNOSIS — Z0000 Encounter for general adult medical examination without abnormal findings: Secondary | ICD-10-CM | POA: Diagnosis not present

## 2019-11-08 DIAGNOSIS — H40013 Open angle with borderline findings, low risk, bilateral: Secondary | ICD-10-CM | POA: Diagnosis not present

## 2019-11-08 DIAGNOSIS — E291 Testicular hypofunction: Secondary | ICD-10-CM | POA: Diagnosis not present

## 2019-11-08 DIAGNOSIS — H04123 Dry eye syndrome of bilateral lacrimal glands: Secondary | ICD-10-CM | POA: Diagnosis not present

## 2019-11-08 DIAGNOSIS — Z1331 Encounter for screening for depression: Secondary | ICD-10-CM | POA: Diagnosis not present

## 2019-11-08 DIAGNOSIS — Z0000 Encounter for general adult medical examination without abnormal findings: Secondary | ICD-10-CM | POA: Diagnosis not present

## 2019-11-08 DIAGNOSIS — N179 Acute kidney failure, unspecified: Secondary | ICD-10-CM | POA: Diagnosis not present

## 2019-11-08 DIAGNOSIS — Z1212 Encounter for screening for malignant neoplasm of rectum: Secondary | ICD-10-CM | POA: Diagnosis not present

## 2019-11-08 DIAGNOSIS — N1831 Chronic kidney disease, stage 3a: Secondary | ICD-10-CM | POA: Diagnosis not present

## 2019-11-08 DIAGNOSIS — Z961 Presence of intraocular lens: Secondary | ICD-10-CM | POA: Diagnosis not present

## 2019-11-08 DIAGNOSIS — J45909 Unspecified asthma, uncomplicated: Secondary | ICD-10-CM | POA: Diagnosis not present

## 2019-11-08 DIAGNOSIS — I129 Hypertensive chronic kidney disease with stage 1 through stage 4 chronic kidney disease, or unspecified chronic kidney disease: Secondary | ICD-10-CM | POA: Diagnosis not present

## 2020-03-03 DIAGNOSIS — K219 Gastro-esophageal reflux disease without esophagitis: Secondary | ICD-10-CM | POA: Diagnosis not present

## 2020-03-03 DIAGNOSIS — Z79899 Other long term (current) drug therapy: Secondary | ICD-10-CM | POA: Diagnosis not present

## 2021-06-21 ENCOUNTER — Emergency Department (HOSPITAL_COMMUNITY)
Admission: EM | Admit: 2021-06-21 | Discharge: 2021-06-21 | Disposition: A | Discharge status: Home / Self Care | Payer: Medicare Other | Attending: Emergency Medicine | Admitting: Emergency Medicine

## 2021-06-21 ENCOUNTER — Encounter (HOSPITAL_COMMUNITY): Payer: Self-pay

## 2021-06-21 ENCOUNTER — Other Ambulatory Visit: Payer: Self-pay

## 2021-06-21 ENCOUNTER — Emergency Department (HOSPITAL_COMMUNITY): Payer: Medicare Other

## 2021-06-21 DIAGNOSIS — S0990XA Unspecified injury of head, initial encounter: Secondary | ICD-10-CM

## 2021-06-21 DIAGNOSIS — W19XXXA Unspecified fall, initial encounter: Secondary | ICD-10-CM

## 2021-06-21 DIAGNOSIS — S0101XA Laceration without foreign body of scalp, initial encounter: Secondary | ICD-10-CM | POA: Diagnosis not present

## 2021-06-21 DIAGNOSIS — W1830XA Fall on same level, unspecified, initial encounter: Secondary | ICD-10-CM | POA: Diagnosis not present

## 2021-06-21 MED ORDER — LIDOCAINE-EPINEPHRINE-TETRACAINE (LET) TOPICAL GEL
3.0000 mL | Freq: Once | TOPICAL | Status: AC
  Administered 2021-06-21: 3 mL via TOPICAL
  Filled 2021-06-21: qty 3

## 2021-06-21 NOTE — ED Provider Notes (Signed)
[facility]-EMERGENCY DEPT Provider Note   CSN: [id number] Arrival date & time: 06/21/21  1802     History  Chief Complaint  Patient presents with   Head Injury   Fall    Miguel Williams is a 76 y.o. male.   Head Injury Fall   76 year old male presenting to the emergency department with a scalp laceration after a ground-level mechanical fall.  The patient states that he tripped and fell breaking the back of his head on a table on the way down.  His last tetanus was updated in 2014.  He is not on anticoagulation.  He denies any other injuries or complaints.  He sustained a laceration to his posterior scalp that was hemostatic on arrival.  Arrived to the emergency department GCS 15, ABC intact.  Home Medications Prior to Admission medications   Medication Sig Start Date End Date Taking? Authorizing Provider  acetaminophen (TYLENOL) 500 MG tablet Take 1 tablet (500 mg total) by mouth every 6 (six) hours as needed for mild pain, moderate pain or fever. 04/07/19   Jae Dire, MD  atorvastatin (LIPITOR) 20 MG tablet Take 20 mg by mouth daily. 03/18/19   [provider]  COLCRYS 0.6 MG tablet TAKE 2 TABLETS (1.2 MG TOTAL) BY MOUTH 2 (TWO) TIMES DAILY. Patient taking differently: Take 1.2 mg by mouth 2 (two) times daily.  04/04/12   Enid Baas, MD  DEXILANT 60 MG capsule TAKE 1 CAPSULE (60 MG TOTAL) BY MOUTH 2 (TWO) TIMES DAILY. Patient taking differently: Take 60 mg by mouth 2 (two) times daily.  04/04/12   Enid Baas, MD  DULoxetine (CYMBALTA) 60 MG capsule Take 60 mg by mouth daily. 03/18/19   [provider]  dutasteride (AVODART) 0.5 MG capsule Take 1 capsule (0.5 mg total) by mouth every other day. 02/12/13   Enid Baas, MD  gabapentin (NEURONTIN) 300 MG capsule Take 1 capsule (300 mg total) by mouth 3 (three) times daily. 02/04/13   Penumalli, Glenford Bayley, MD  oxyCODONE-acetaminophen (PERCOCET/ROXICET) 5-325 MG tablet Take 2 tablets by  mouth every 6 (six) hours as needed for up to 10 doses for severe pain. 06/24/19   Terald Sleeper, MD  probenecid (BENEMID) 500 MG tablet Take 1 tablet (500 mg total) by mouth daily. 02/12/13   Enid Baas, MD  Tamsulosin HCl (FLOMAX) 0.4 MG CAPS TAKE 1 CAPSULE (0.4 MG TOTAL) BY MOUTH DAILY. Patient taking differently: Take 0.4 mg by mouth daily.  04/04/12   Enid Baas, MD  valsartan (DIOVAN) 40 MG tablet Take 40 mg by mouth daily. 03/19/19   [provider]      Allergies    Sulfonamide derivatives and Tegretol [carbamazepine]    Review of Systems   Review of Systems  All other systems reviewed and are negative.  Physical Exam Updated Vital Signs BP (!) 155/89    Pulse 89    Temp 97.9 F (36.6 C) (Oral)    Resp 18    Ht 5\' 8"  (1.727 m)    Wt 86.2 kg    SpO2 99%    BMI 28.89 kg/m  Physical Exam Vitals and nursing note reviewed.  Constitutional:      Appearance: He is well-developed.     Comments: GCS 15, ABC intact  HENT:     Head: Normocephalic.     Comments: 2 cm laceration to the posterior occiput, hemostatic Eyes:     Conjunctiva/sclera: Conjunctivae normal.  Neck:     Comments:  No midline tenderness to palpation of the cervical spine. ROM intact. Cardiovascular:     Rate and Rhythm: Normal rate and regular rhythm.  Pulmonary:     Effort: Pulmonary effort is normal. No respiratory distress.     Breath sounds: Normal breath sounds.  Chest:     Comments: Chest wall stable and non-tender to AP and lateral compression. Clavicles stable and non-tender to AP compression Abdominal:     Palpations: Abdomen is soft.     Tenderness: There is no abdominal tenderness.     Comments: Pelvis stable to lateral compression.  Musculoskeletal:     Cervical back: Neck supple.     Comments: No midline tenderness to palpation of the thoracic or lumbar spine. Extremities atraumatic with intact ROM.   Skin:    General: Skin is warm and dry.  Neurological:     Mental Status:  He is alert.     Comments: CN II-XII grossly intact. Moving all four extremities spontaneously and sensation grossly intact.    ED Results / Procedures / Treatments   Labs (all labs ordered are listed, but only abnormal results are displayed) Labs Reviewed - No data to display  EKG None  Radiology CT Head Wo Contrast  Result Date: 06/21/2021 CLINICAL DATA:  Trauma. EXAM: CT HEAD WITHOUT CONTRAST CT CERVICAL SPINE WITHOUT CONTRAST TECHNIQUE: Multidetector CT imaging of the head and cervical spine was performed following the standard protocol without intravenous contrast. Multiplanar CT image reconstructions of the cervical spine were also generated. RADIATION DOSE REDUCTION: This exam was performed according to the departmental dose-optimization program which includes automated exposure control, adjustment of the mA and/or kV according to patient size and/or use of iterative reconstruction technique. COMPARISON:  None. FINDINGS: CT HEAD FINDINGS Brain: Mild age-related atrophy and chronic microvascular ischemic changes. There is no acute intracranial hemorrhage. No mass effect midline shift. No extra-axial fluid collection. Vascular: No hyperdense vessel or unexpected calcification. Skull: Normal. Negative for fracture or focal lesion. Sinuses/Orbits: No acute finding. Other: None CT CERVICAL SPINE FINDINGS Alignment: No acute subluxation. Skull base and vertebrae: No acute fracture. Soft tissues and spinal canal: No prevertebral fluid or swelling. No visible canal hematoma. Disc levels: Multilevel degenerative changes with disc space narrowing and endplate irregularity and spurring most prominent at C5-C6 and C4-C5. Multilevel facet arthropathy. Upper chest: Negative. Other: Bilateral carotid bulb calcified plaques. IMPRESSION: 1. No acute intracranial pathology. Mild age-related atrophy and chronic microvascular ischemic changes. 2. No acute/traumatic cervical spine pathology. Multilevel degenerative  changes. Electronically Signed   By: Elgie Collard M.D.   On: 06/21/2021 20:00   CT Cervical Spine Wo Contrast  Result Date: 06/21/2021 CLINICAL DATA:  Trauma. EXAM: CT HEAD WITHOUT CONTRAST CT CERVICAL SPINE WITHOUT CONTRAST TECHNIQUE: Multidetector CT imaging of the head and cervical spine was performed following the standard protocol without intravenous contrast. Multiplanar CT image reconstructions of the cervical spine were also generated. RADIATION DOSE REDUCTION: This exam was performed according to the departmental dose-optimization program which includes automated exposure control, adjustment of the mA and/or kV according to patient size and/or use of iterative reconstruction technique. COMPARISON:  None. FINDINGS: CT HEAD FINDINGS Brain: Mild age-related atrophy and chronic microvascular ischemic changes. There is no acute intracranial hemorrhage. No mass effect midline shift. No extra-axial fluid collection. Vascular: No hyperdense vessel or unexpected calcification. Skull: Normal. Negative for fracture or focal lesion. Sinuses/Orbits: No acute finding. Other: None CT CERVICAL SPINE FINDINGS Alignment: No acute subluxation. Skull base and vertebrae: No acute  fracture. Soft tissues and spinal canal: No prevertebral fluid or swelling. No visible canal hematoma. Disc levels: Multilevel degenerative changes with disc space narrowing and endplate irregularity and spurring most prominent at C5-C6 and C4-C5. Multilevel facet arthropathy. Upper chest: Negative. Other: Bilateral carotid bulb calcified plaques. IMPRESSION: 1. No acute intracranial pathology. Mild age-related atrophy and chronic microvascular ischemic changes. 2. No acute/traumatic cervical spine pathology. Multilevel degenerative changes. Electronically Signed   By: Elgie Collard M.D.   On: 06/21/2021 20:00    Procedures .Marland KitchenLaceration Repair  Date/Time: 06/21/2021 11:01 PM Performed by: Ernie Avena, MD Authorized by: Ernie Avena, MD   Consent:    Consent obtained:  Verbal   Consent given by:  Patient   Risks discussed:  Infection and pain Universal protocol:    Patient identity confirmed:  Verbally with patient Anesthesia:    Anesthesia method:  Topical application   Topical anesthetic:  LET Laceration details:    Location:  Scalp   Scalp location:  Occipital   Length (cm):  2 Pre-procedure details:    Preparation:  Imaging obtained to evaluate for foreign bodies Exploration:    Imaging outcome: foreign body not noted   Treatment:    Area cleansed with:  Soap and water   Amount of cleaning:  Standard   Debridement:  None   Undermining:  None Skin repair:    Repair method:  Staples   Number of staples:  5 Repair type:    Repair type:  Simple Post-procedure details:    Dressing:  Open (no dressing)   Procedure completion:  Tolerated    Medications Ordered in ED Medications  lidocaine-EPINEPHrine-tetracaine (LET) topical gel (3 mLs Topical Given 06/21/21 2234)    ED Course/ Medical Decision Making/ A&P                           Medical Decision Making   76 year old male presenting to the emergency department with a scalp laceration after a ground-level mechanical fall.  The patient states that he tripped and fell breaking the back of his head on a table on the way down.  His last tetanus was updated in 2014.  He is not on anticoagulation.  He denies any other injuries or complaints.  He sustained a laceration to his posterior scalp that was hemostatic on arrival.  Arrived to the emergency department GCS 15, ABC intact.  On arrival, the patient was vitally stable, initial sinus tachycardia noted on cardiac telemetry, subsequently improved without intervention.  CT of the head and cervical spine was performed which was negative for acute intracranial abnormality, no evidence of fracture of the skull, no evidence of fracture or malalignment of the cervical spine.  The patient's wound was  irrigated by nursing staff bedside and LET gel was applied.  Wound approximation was achieved with 5 staples as per the procedure note above.  Wound care instructions were provided.  No other injuries identified on primary or secondary survey.  Overall stable for discharge at this time.   Final Clinical Impression(s) / ED Diagnoses Final diagnoses:  Injury of head, initial encounter  Fall, initial encounter  Laceration of scalp, initial encounter    Rx / DC Orders ED Discharge Orders     None         Ernie Avena, MD 06/22/21 0009

## 2021-06-21 NOTE — ED Provider Triage Note (Signed)
Emergency Medicine Provider Triage Evaluation Note  Miguel Williams , a 76 y.o. male  was evaluated in triage.  Pt complains of mechanical nonsyncopal trip and fall.  He states that this afternoon he had made dinner and was walking in the living room when he tripped and fell striking the back of his head on a table.  He is unsure last tetanus, chart review shows he had 1 in 2014. He does not take any blood thinning medications.  He denies any other injuries.   Physical Exam  BP (!) 157/98 (BP Location: Right Arm)    Pulse (!) 110    Temp 97.9 F (36.6 C) (Oral)    Resp 16    Ht 5\' 8"  (1.727 m)    Wt 86.2 kg    BMI 28.89 kg/m  Gen:   Awake, no distress   Resp:  Normal effort  MSK:   Moves extremities without difficulty  Other:  Normal speech.  There is approximately a 2 to 3 cm laceration on the occiput of the scalp with scant oozing.  Patient is ambulatory  Medical Decision Making  Medically screening exam initiated at 7:16 PM.  Appropriate orders placed.  Miguel Williams was informed that the remainder of the evaluation will be completed by another provider, this initial triage assessment does not replace that evaluation, and the importance of remaining in the ED until their evaluation is complete.  Based on patient's age will obtain CT scans of head and neck.  He will most likely require staples for wound repair.   Miguel Williams, [place] 06/21/21 1922

## 2021-06-21 NOTE — Discharge Instructions (Addendum)
You were evaluated in the Emergency Department and after careful evaluation, we did not find any emergent condition requiring admission or further testing in the hospital.  Your exam/testing today was overall reassuring.  Your CT scan of the head and cervical spine did not reveal acute traumatic injury.  You had a small posterior scalp laceration which was repaired with sutures.  Antibiotics are generally not indicated for simple scalp lacerations.  Watch for signs of developing infection to include redness, swelling, worsening pain, fever/chills, purulent drainage.  It is okay to shower. Sutures will need to be removed in 7 to 10 days.  Please return to the Emergency Department if you experience any worsening of your condition.  Thank you for allowing [place] to be a part of your care.

## 2021-06-21 NOTE — ED Triage Notes (Addendum)
Patient reports that he tripped and fell and hit his head on an end table. Patient has a laceration to the back of his head. Patient denies any dizziness, LOC, or blurred vision.

## 2021-07-01 ENCOUNTER — Ambulatory Visit (INDEPENDENT_AMBULATORY_CARE_PROVIDER_SITE_OTHER): Payer: Medicare Other | Admitting: Sports Medicine

## 2021-07-01 DIAGNOSIS — S0101XA Laceration without foreign body of scalp, initial encounter: Secondary | ICD-10-CM | POA: Insufficient documentation

## 2021-07-01 DIAGNOSIS — S0101XD Laceration without foreign body of scalp, subsequent encounter: Secondary | ICD-10-CM

## 2021-07-01 NOTE — Patient Instructions (Signed)
Mr Davonte Siebenaler, ? ?It was a pleasure seeing you in clinic. Today we discussed:  ? ?Staple removal: ?- Please contact us if you have any headaches or further bleeding from the site of injury ? ?Thank you, we look forward to helping you remain healthy! ? ? ? ?

## 2021-07-01 NOTE — Progress Notes (Signed)
PCP: Crist Infante, MD ? ?Subjective:  ? ?HPI: ?[name] is a 76 y.o. male here for staple removal. Patient presented to the ED on 2/27 with a head laceration following a ground-level mechanical fall without loss of consciousness. He had a 2cm hemostatic posterior occipital scalp lesion. He received 5 staples at the time. He is following up today for staple removal. He reports doing well and denies any bleeding, lightheadedness/dizziness or headaches.  ? ?Past Medical History:  ?Diagnosis Date  ? Abdominal pain, lower   ? Agitation   ? Alcohol abuse, continuous   ? Allergy, unspecified, initial encounter   ? Anxiety disorder   ? Bipolar 1 disorder ([place])   ? 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 Chronic kidney disease, stage III (moderate) (HCC)   ? Chronic kidney disease, stage III (moderate) (HCC)   ? Cocaine abuse, continuous ([place])   ? COPD (chronic obstructive pulmonary disease) ([place])   ? Coronary atherosclerosis of native coronary artery   ? Esophageal reflux   ? Fatigue   ? Fungal granuloma   ? Hip pain   ? History of 2019 novel coronavirus disease (COVID-19)   ? History of endoscopy 02/00/2011  ? Hypo-osmolality and hyponatremia   ? Hypokalemia   ? 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 Insomnia due to medical condition   ? Iron deficiency anemia secondary to blood loss (chronic)   ? Iron deficiency anemia, unspecified   ? Lability emotional   ? Low back pain   ? Moderate protein-calorie malnutrition ([place])   ? Muscle wasting   ? Posttraumatic stress disorder   ? Presence of left artificial hip joint   ? Restlessness   ? Unspecified fall, subsequent encounter   ? Vitamin D deficiency, unspecified   ? Wernicke's encephalopathy 06/23/2019  ? ? ?Current Outpatient Medications on File Prior to Visit  ?Medication Sig Dispense Refill  ? acetaminophen (TYLENOL) 500 MG tablet Take 1 tablet (500 mg total) by mouth every 6 (six) hours as  needed for mild pain, moderate pain or fever. 30 tablet 0  ? atorvastatin (LIPITOR) 20 MG tablet Take 20 mg by mouth daily.    ? COLCRYS 0.6 MG tablet TAKE 2 TABLETS (1.2 MG TOTAL) BY MOUTH 2 (TWO) TIMES DAILY. (Patient taking differently: Take 1.2 mg by mouth 2 (two) times daily. ) 120 tablet 5  ? DEXILANT 60 MG capsule TAKE 1 CAPSULE (60 MG TOTAL) BY MOUTH 2 (TWO) TIMES DAILY. (Patient taking differently: Take 60 mg by mouth 2 (two) times daily. ) 60 capsule 5  ? DULoxetine (CYMBALTA) 60 MG capsule Take 60 mg by mouth daily.    ? dutasteride (AVODART) 0.5 MG capsule Take 1 capsule (0.5 mg total) by mouth every other day. 30 capsule 0  ? gabapentin (NEURONTIN) 300 MG capsule Take 1 capsule (300 mg total) by mouth 3 (three) times daily. 90 capsule 5  ? oxyCODONE-acetaminophen (PERCOCET/ROXICET) 5-325 MG tablet Take 2 tablets by mouth every 6 (six) hours as needed for up to 10 doses for severe pain. 10 tablet 0  ? probenecid (BENEMID) 500 MG tablet Take 1 tablet (500 mg total) by mouth daily. 30 tablet 1  ? Tamsulosin HCl (FLOMAX) 0.4 MG CAPS TAKE 1 CAPSULE (0.4 MG TOTAL) BY MOUTH DAILY. (Patient taking differently: Take 0.4 mg by mouth daily. ) 30 capsule 6  ? valsartan (DIOVAN) 40 MG tablet  Take 40 mg by mouth daily.    ? ?No current facility-administered medications on file prior to visit.  ? ? ?Past Surgical History:  ?Procedure Laterality Date  ? CATARACT EXTRACTION  2012  ? COLONOSCOPY W/ ENDOSCOPIC [place]    ? [place]  ? ? ?Allergies  ?Allergen Reactions  ? Sulfonamide Derivatives   ? Tegretol [Carbamazepine] Hives  ? ? ?BP 138/84   Ht 5\' 10"  (1.778 m)   Wt 190 lb (86.2 kg)   BMI 27.26 kg/m?  ? ?No flowsheet data found. ? ?No flowsheet data found. ? ?    ?Objective:  ?Physical Exam: ? ?Constitutional: Appears well-developed and well-nourished. No distress.  ?HENT: Normocephalic; well-healed 2cm lesion on posterior occipital scalp with dried blood and 5 staples noted.  ?Respiratory: No respiratory  distress, effort normal on room air  ?Musculoskeletal: Normal bulk and tone.   ?Neurological: Is alert and oriented x4, no apparent focal deficits noted. ?Psychiatric: Normal mood and affect.  ? ?  ?Assessment & Plan:  ?1. Posterior occipital scalp laceration ?Patient is presenting for staple removal. He sustained a posterior scalp laceration following a ground level mechanical fall on 2/27 for which he presented to the ED and had 5 staples placed. He is doing well and denies any symptoms at this time. Lesion appears well healed at this time. Staples removed without difficulty. Patient tolerated well. ?Follow up as needed  ? ?I observed and examined the patient with the resident and agree with assessment and plan.  Note reviewed and modified by me. ?Ila Mcgill, MD ? ?

## 2021-07-01 NOTE — Assessment & Plan Note (Signed)
Healing well ?For suture removal only ?Wash hair ?Use peroxide half strength if any drainage ?

## 2021-12-11 DIAGNOSIS — N200 Calculus of kidney: Secondary | ICD-10-CM | POA: Insufficient documentation

## 2022-01-07 ENCOUNTER — Encounter: Payer: Self-pay | Admitting: Interventional Cardiology

## 2022-01-07 ENCOUNTER — Ambulatory Visit: Payer: Medicare Other | Attending: Interventional Cardiology | Admitting: Interventional Cardiology

## 2022-01-07 VITALS — BP 110/70 | HR 88 | Ht 70.0 in | Wt 196.8 lb

## 2022-01-07 DIAGNOSIS — I2510 Atherosclerotic heart disease of native coronary artery without angina pectoris: Secondary | ICD-10-CM | POA: Diagnosis not present

## 2022-01-07 DIAGNOSIS — I2584 Coronary atherosclerosis due to calcified coronary lesion: Secondary | ICD-10-CM

## 2022-01-07 DIAGNOSIS — N183 Chronic kidney disease, stage 3 unspecified: Secondary | ICD-10-CM | POA: Diagnosis not present

## 2022-01-07 DIAGNOSIS — I700 Atherosclerosis of aorta: Secondary | ICD-10-CM

## 2022-01-07 DIAGNOSIS — R7303 Prediabetes: Secondary | ICD-10-CM

## 2022-01-07 DIAGNOSIS — E782 Mixed hyperlipidemia: Secondary | ICD-10-CM

## 2022-01-07 NOTE — Progress Notes (Signed)
Cardiology Office Note   Date:  01/07/2022   ID:  Miguel Williams, DOB 12/18/1945, MRN [id number]  PCP:  Rodrigo Ran, MD    No chief complaint on file.  Coronary calcification, hyperlipidemia  Wt Readings from Last 3 Encounters:  01/07/22 196 lb 12.8 oz (89.3 kg)  07/01/21 190 lb (86.2 kg)  06/21/21 190 lb (86.2 kg)       History of Present Illness: Miguel Williams is a 76 y.o. male who is being seen today for the evaluation of coronary artery calcification at the request of Rodrigo Ran, MD.   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    He walks a lot.  He does a powerwalk.  17 minutes to walk 1 mile.   Denies : Chest pain. Dizziness. Leg edema. Nitroglycerin use. Orthopnea. Palpitations. Paroxysmal nocturnal dyspnea. Shortness of breath. Syncope.      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)    Chronic kidney disease, stage III (moderate) (HCC)    Chronic kidney disease, stage III (moderate) (HCC)    Cocaine abuse, continuous (HCC)    COPD (chronic obstructive pulmonary disease) (HCC)    Coronary atherosclerosis of native coronary artery    Esophageal reflux    Fatigue    Fungal granuloma    Hip pain    History of 2019 novel coronavirus disease (COVID-19)    History of endoscopy 02/00/2011   Hypo-osmolality and hyponatremia    Hypokalemia    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)    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)    Insomnia due to medical condition    Iron deficiency anemia secondary to blood loss (chronic)    Iron deficiency anemia, unspecified    Lability emotional    Low back pain    Moderate protein-calorie malnutrition (HCC)    Muscle wasting    Posttraumatic stress disorder    Presence of left artificial hip joint     Restlessness    Unspecified fall, subsequent encounter    Vitamin D deficiency, unspecified    Wernicke's encephalopathy 06/23/2019    Past Surgical History:  Procedure Laterality Date   CATARACT EXTRACTION  2012   COLONOSCOPY W/ ENDOSCOPIC [place]     TONSILLECTOMY  1952     Current Outpatient Medications  Medication Sig Dispense Refill   acetaminophen (TYLENOL) 500 MG tablet Take 1 tablet (500 mg total) by mouth every 6 (six) hours as needed for mild pain, moderate pain or fever. 30 tablet 0   atorvastatin (LIPITOR) 40 MG tablet Take 1 tablet by mouth daily at 6 (six) AM.     CLOBETASOL PROPIONATE E 0.05 % emollient cream Apply topically as needed.     clonazePAM (KLONOPIN) 0.5 MG tablet Take 0.5 mg by mouth daily as needed.     COLCRYS 0.6 MG tablet TAKE 2 TABLETS (1.2 MG TOTAL) BY MOUTH 2 (TWO) TIMES DAILY. (Patient taking differently: Take 1.2 mg by mouth 2 (two) times daily.) 120 tablet 5   DEXILANT 60 MG capsule TAKE 1 CAPSULE (60 MG TOTAL) BY MOUTH 2 (TWO) TIMES DAILY. (Patient taking differently: Take 60 mg by mouth 2 (two) times daily.) 60 capsule 5   DULoxetine (CYMBALTA) 60 MG capsule  Take 60 mg by mouth daily.     dutasteride (AVODART) 0.5 MG capsule Take 1 capsule (0.5 mg total) by mouth every other day. 30 capsule 0   ezetimibe (ZETIA) 10 MG tablet Take 10 mg by mouth daily.     gabapentin (NEURONTIN) 300 MG capsule Take 1 capsule (300 mg total) by mouth 3 (three) times daily. 90 capsule 5   lansoprazole (PREVACID) 30 MG capsule Take 30 mg by mouth 2 (two) times daily.     levothyroxine (SYNTHROID) 50 MCG tablet Take 1 tablet by mouth daily at 6 (six) AM.     methocarbamol (ROBAXIN) 500 MG tablet Take 1 tablet by mouth daily at 6 (six) AM.     nitrofurantoin, macrocrystal-monohydrate, (MACROBID) 100 MG capsule Take 100 mg by mouth 2 (two) times daily.     oxyCODONE-acetaminophen (PERCOCET/ROXICET) 5-325 MG tablet Take 2 tablets by mouth every 6 (six) hours as needed for up  to 10 doses for severe pain. 10 tablet 0   probenecid (BENEMID) 500 MG tablet Take 1 tablet (500 mg total) by mouth daily. 30 tablet 1   solifenacin (VESICARE) 5 MG tablet Take 5 mg by mouth daily.     Tamsulosin HCl (FLOMAX) 0.4 MG CAPS TAKE 1 CAPSULE (0.4 MG TOTAL) BY MOUTH DAILY. (Patient taking differently: Take 0.4 mg by mouth daily.) 30 capsule 6   valsartan (DIOVAN) 40 MG tablet Take 40 mg by mouth daily.     No current facility-administered medications for this visit.    Allergies:   Allopurinol, Baclofen, Neomycin, Sulfonamide derivatives, Tegretol [carbamazepine], and Sulfa antibiotics    Social History:  The patient  reports that he has never smoked. He has never used smokeless tobacco. He reports current alcohol use of about 7.0 standard drinks of alcohol per week. He reports that he does not currently use drugs.   Family History:  The patient's family history includes Lung cancer in his father.    ROS:  Please see the history of present illness.   Otherwise, review of systems are positive for increase in lipids.   All other systems are reviewed and negative.    PHYSICAL EXAM: VS:  BP 110/70   Pulse 88   Ht 5\' 10"  (1.778 m)   Wt 196 lb 12.8 oz (89.3 kg)   SpO2 98%   BMI 28.24 kg/m  , BMI Body mass index is 28.24 kg/m. GEN: Well nourished, well developed, in no acute distress HEENT: normal Neck: no JVD, carotid bruits, or masses Cardiac: RRR; no murmurs, rubs, or gallops,no edema  Respiratory:  clear to auscultation bilaterally, normal work of breathing GI: soft, nontender, nondistended, + BS MS: no deformity or atrophy Skin: warm and dry, no rash Neuro:  Strength and sensation are intact Psych: euthymic mood, full affect   EKG:   The ekg ordered today demonstrates normal sinus rhythm, right bundle branch block, left anterior fascicular block   Recent Labs: No results found for requested labs within last 365 days.   Lipid Panel    Component Value Date/Time    CHOL 186 04/12/2012 1101   TRIG 127 04/12/2012 1101   HDL 64 04/12/2012 1101   CHOLHDL 2.9 04/12/2012 1101   VLDL 25 04/12/2012 1101   LDLCALC 97 04/12/2012 1101     Other studies Reviewed: Additional studies/ records that were reviewed today with results demonstrating: labs reviewed.  July 2023: Cholesterol 247, LDL 118, HDL 48, triglycerides 403   ASSESSMENT AND PLAN:  Coronary artery calcification:  No angina.  Coronary calcification noted on prior CT scan.  He remains very active without cardiac symptoms.  I encouraged him to continue his active lifestyle.  He is trying to improve his diet as well.  He is decreasing red meat intake.  I encouraged a whole food, plant-based diet.  High-fiber diet.  Avoid processed foods.  We discussed stress testing but given that he feels so well with activity, he declined.  I think this is reasonable.  We spoke about if any changes occur to his exercise tolerance, he will let us know. Aortic atherosclerosis: Statin recently increased.  No claudication symptoms. Hyperlipidemia: 50 point increase in a 6 months period.  Zetia was added and atorvastatin increased from 20 mg to 40 mg daily.  I agree with these changes.  Recheck lipids in November.  We will see where his lipids are.  Will likely refer to our Pharm.[facility]. lipid clinic to see what other adjustments could be made to his lipid-lowering therapy.  Ideally, would like to see his LDL closer to 70. PreDM: Decreasing red meat. Increasing fish.  Continue regular exercise. Chronic renal insufficiency: Creatinine 1.8.  Stay well-hydrated.  Avoid nephrotoxins.    Current medicines are reviewed at length with the patient today.  The patient concerns regarding his medicines were addressed.  The following changes have been made:  No change  Labs/ tests ordered today include:  No orders of the defined types were placed in this encounter.   Recommend 150 minutes/week of aerobic exercise Low fat, low carb,  high fiber diet recommended  Disposition:   FU in 1 year or sooner if symptoms arise.   Signed, Larae Grooms, MD  01/07/2022 3:04 PM    [facility], [place], [place]  [address] Phone: [phone]; Fax: [phone]

## 2022-01-07 NOTE — Patient Instructions (Signed)
Medication Instructions:  Your physician recommends that you continue on your current medications as directed. Please refer to the Current Medication list given to you today.  *If you need a refill on your cardiac medications before your next appointment, please call your pharmacy*   Lab Work: none If you have labs (blood work) drawn today and your tests are completely normal, you will receive your results only by: MyChart Message (if you have MyChart) OR A paper copy in the mail If you have any lab test that is abnormal or we need to change your treatment, we will call you to review the results.   Testing/Procedures: none   Follow-Up: At [facility], you and your health needs are our priority.  As part of our continuing mission to provide you with exceptional heart care, we have created designated Provider Care Teams.  These Care Teams include your primary Cardiologist (physician) and Advanced Practice Providers (APPs -  Physician Assistants and Nurse Practitioners) who all work together to provide you with the care you need, when you need it.  We recommend signing up for the patient portal called "MyChart".  Sign up information is provided on this After Visit Summary.  MyChart is used to connect with patients for Virtual Visits (Telemedicine).  Patients are able to view lab/test results, encounter notes, upcoming appointments, etc.  Non-urgent messages can be sent to your provider as well.   To learn more about what you can do with MyChart, go to [email or web address].    Your next appointment:   12 month(s)  The format for your next appointment:   In Person  Provider:   Jayadeep Varanasi, MD     Other Instructions    Important Information About Sugar       

## 2022-07-19 ENCOUNTER — Emergency Department (HOSPITAL_BASED_OUTPATIENT_CLINIC_OR_DEPARTMENT_OTHER)
Admission: EM | Admit: 2022-07-19 | Discharge: 2022-07-19 | Disposition: A | Discharge status: Home / Self Care | Payer: Medicare Other | Attending: Emergency Medicine | Admitting: Emergency Medicine

## 2022-07-19 ENCOUNTER — Emergency Department (HOSPITAL_BASED_OUTPATIENT_CLINIC_OR_DEPARTMENT_OTHER): Payer: Medicare Other

## 2022-07-19 ENCOUNTER — Encounter (HOSPITAL_BASED_OUTPATIENT_CLINIC_OR_DEPARTMENT_OTHER): Payer: Self-pay

## 2022-07-19 ENCOUNTER — Other Ambulatory Visit: Payer: Self-pay

## 2022-07-19 DIAGNOSIS — R109 Unspecified abdominal pain: Secondary | ICD-10-CM | POA: Diagnosis not present

## 2022-07-19 DIAGNOSIS — R6883 Chills (without fever): Secondary | ICD-10-CM | POA: Insufficient documentation

## 2022-07-19 DIAGNOSIS — R112 Nausea with vomiting, unspecified: Secondary | ICD-10-CM | POA: Insufficient documentation

## 2022-07-19 DIAGNOSIS — R1911 Absent bowel sounds: Secondary | ICD-10-CM | POA: Insufficient documentation

## 2022-07-19 DIAGNOSIS — R140 Abdominal distension (gaseous): Secondary | ICD-10-CM | POA: Insufficient documentation

## 2022-07-19 LAB — CBC
HCT: 41.9 % (ref 39.0–52.0)
Hemoglobin: 14.3 g/dL (ref 13.0–17.0)
MCH: 32.1 pg (ref 26.0–34.0)
MCHC: 34.1 g/dL (ref 30.0–36.0)
MCV: 94.2 fL (ref 80.0–100.0)
Platelets: 219 10*3/uL (ref 150–400)
RBC: 4.45 MIL/uL (ref 4.22–5.81)
RDW: 12.8 % (ref 11.5–15.5)
WBC: 10.3 10*3/uL (ref 4.0–10.5)
nRBC: 0 % (ref 0.0–0.2)

## 2022-07-19 LAB — LIPASE, BLOOD: Lipase: 22 U/L (ref 11–51)

## 2022-07-19 LAB — COMPREHENSIVE METABOLIC PANEL
ALT: 37 U/L (ref 0–44)
AST: 28 U/L (ref 15–41)
Albumin: 4.3 g/dL (ref 3.5–5.0)
Alkaline Phosphatase: 46 U/L (ref 38–126)
Anion gap: 11 (ref 5–15)
BUN: 31 mg/dL — ABNORMAL HIGH (ref 8–23)
CO2: 24 mmol/L (ref 22–32)
Calcium: 9.8 mg/dL (ref 8.9–10.3)
Chloride: 106 mmol/L (ref 98–111)
Creatinine, Ser: 1.6 mg/dL — ABNORMAL HIGH (ref 0.61–1.24)
GFR, Estimated: 44 mL/min — ABNORMAL LOW (ref >60)
Glucose, Bld: 164 mg/dL — ABNORMAL HIGH (ref 70–99)
Potassium: 4.2 mmol/L (ref 3.5–5.1)
Sodium: 141 mmol/L (ref 135–145)
Total Bilirubin: 0.6 mg/dL (ref 0.3–1.2)
Total Protein: 7.3 g/dL (ref 6.5–8.1)

## 2022-07-19 LAB — URINALYSIS, ROUTINE W REFLEX MICROSCOPIC
Bacteria, UA: NONE SEEN
Bilirubin Urine: NEGATIVE
Glucose, UA: NEGATIVE mg/dL
Leukocytes,Ua: NEGATIVE
Nitrite: NEGATIVE
RBC / HPF: >50 RBC/hpf (ref 0–5)
Specific Gravity, Urine: >1.046 — ABNORMAL HIGH (ref 1.005–1.030)
pH: 5.5 (ref 5.0–8.0)

## 2022-07-19 MED ORDER — LACTATED RINGERS IV BOLUS
500.0000 mL | Freq: Once | INTRAVENOUS | Status: AC
  Administered 2022-07-19: 500 mL via INTRAVENOUS

## 2022-07-19 MED ORDER — ONDANSETRON HCL 4 MG/2ML IJ SOLN
4.0000 mg | Freq: Once | INTRAMUSCULAR | Status: AC
  Administered 2022-07-19: 4 mg via INTRAVENOUS
  Filled 2022-07-19: qty 2

## 2022-07-19 MED ORDER — IOHEXOL 300 MG/ML  SOLN
100.0000 mL | Freq: Once | INTRAMUSCULAR | Status: AC | PRN
  Administered 2022-07-19: 70 mL via INTRAVENOUS

## 2022-07-19 NOTE — ED Notes (Signed)
Pt ambulated with steady gait to restroom to provide urine specimen

## 2022-07-19 NOTE — ED Notes (Signed)
Pt aware for the need of a urine sample. Urinal at bedside.

## 2022-07-19 NOTE — ED Notes (Signed)
RN reviewed discharge instructions with pt. Pt verbalized understanding and had no further questions. VSS upon discharge.  

## 2022-07-19 NOTE — ED Triage Notes (Signed)
Pt states he's been "throwing my guts up since Sunday night." Advises it's "probably food postioning but I don't know."

## 2022-07-19 NOTE — Discharge Instructions (Addendum)
Your laboratories also are within normal limits today.  We discussed the finding on your CT scan and you need to follow-up with urology.  If you do not have improvement in symptoms in the next few days, please return to the emergency department.

## 2022-07-19 NOTE — ED Notes (Signed)
Pt tolerating fluids at this time 

## 2022-07-19 NOTE — ED Notes (Signed)
Patient transported to CT 

## 2022-07-19 NOTE — ED Provider Notes (Signed)
[facility] Provider Note   CSN: [id number] Arrival date & time: 07/19/22  1449     History  Chief Complaint  Patient presents with   Nausea   Emesis    Miguel Williams is a 77 y.o. male.  77 y.o male with no PMH presents to the ED with a chief complaint of nausea, vomiting x 3 days.  Patient reports on Sunday evening having some "bad chicken "for lunch, reports immediately after eating this he felt dizzy, he still aided and had vomiting episodes since.  He has not been able to keep anything down for the past 3 days.  He is unable to tolerate fluids or solids.  He also reports his last bowel movement has not occurred since Sunday, however he is still passing gas.  He was prescribed Zofran by his PCP yesterday and took some last night but proceeded to vomit after this.  He had another episode of vomiting this morning.  He was trying to drink some sweet tea to help with his nausea and vomiting but there was no improvement in his symptoms.  There is no alleviating factors.  He does endorse some lower abdominal pain, describing it as "a folding sensation to the right lower quadrant ".  He denies any hematemesis, no fever, no diarrhea, no prior surgical interventions of his abdomen.   The history is provided by the patient.  Emesis Associated symptoms: abdominal pain and chills   Associated symptoms: no diarrhea and no fever        Home Medications Prior to Admission medications   Medication Sig Start Date End Date Taking? Authorizing Provider  acetaminophen (TYLENOL) 500 MG tablet Take 1 tablet (500 mg total) by mouth every 6 (six) hours as needed for mild pain, moderate pain or fever. 04/07/19   Harold Hedge, MD  atorvastatin (LIPITOR) 40 MG tablet Take 1 tablet by mouth daily at 6 (six) AM.    [provider]  CLOBETASOL PROPIONATE E 0.05 % emollient cream Apply topically as needed. 10/21/21   [provider]  clonazePAM  (KLONOPIN) 0.5 MG tablet Take 0.5 mg by mouth daily as needed. 10/21/21   [provider]  COLCRYS 0.6 MG tablet TAKE 2 TABLETS (1.2 MG TOTAL) BY MOUTH 2 (TWO) TIMES DAILY. Patient taking differently: Take 1.2 mg by mouth 2 (two) times daily. 04/04/12   Stefanie Libel, MD  DEXILANT 60 MG capsule TAKE 1 CAPSULE (60 MG TOTAL) BY MOUTH 2 (TWO) TIMES DAILY. Patient taking differently: Take 60 mg by mouth 2 (two) times daily. 04/04/12   Stefanie Libel, MD  DULoxetine (CYMBALTA) 60 MG capsule Take 60 mg by mouth daily. 03/18/19   [provider]  dutasteride (AVODART) 0.5 MG capsule Take 1 capsule (0.5 mg total) by mouth every other day. 02/12/13   Stefanie Libel, MD  ezetimibe (ZETIA) 10 MG tablet Take 10 mg by mouth daily. 11/01/21   [provider]  gabapentin (NEURONTIN) 300 MG capsule Take 1 capsule (300 mg total) by mouth 3 (three) times daily. 02/04/13   Penumalli, Earlean Polka, MD  lansoprazole (PREVACID) 30 MG capsule Take 30 mg by mouth 2 (two) times daily. 10/12/21   [provider]  levothyroxine (SYNTHROID) 50 MCG tablet Take 1 tablet by mouth daily at 6 (six) AM. 12/21/21   [provider]  methocarbamol (ROBAXIN) 500 MG tablet Take 1 tablet by mouth daily at 6 (six) AM. 10/18/21   [provider]  nitrofurantoin, macrocrystal-monohydrate, (MACROBID) 100 MG capsule Take 100 mg by mouth 2 (two) times daily. 11/30/21   [provider]  oxyCODONE-acetaminophen (PERCOCET/ROXICET) 5-325 MG tablet Take 2 tablets by mouth every 6 (six) hours as needed for up to 10 doses for severe pain. 06/24/19   Wyvonnia Dusky, MD  probenecid (BENEMID) 500 MG tablet Take 1 tablet (500 mg total) by mouth daily. 02/12/13   Stefanie Libel, MD  solifenacin (VESICARE) 5 MG tablet Take 5 mg by mouth daily. 10/12/21   [provider]  Tamsulosin HCl (FLOMAX) 0.4 MG CAPS TAKE 1 CAPSULE (0.4 MG TOTAL) BY MOUTH DAILY. Patient taking differently: Take 0.4 mg by mouth  daily. 04/04/12   Stefanie Libel, MD  valsartan (DIOVAN) 40 MG tablet Take 40 mg by mouth daily. 03/19/19   [provider]      Allergies    Allopurinol, Baclofen, Neomycin, Sulfonamide derivatives, Tegretol [carbamazepine], and Sulfa antibiotics    Review of Systems   Review of Systems  Constitutional:  Positive for chills. Negative for fever.  Respiratory:  Negative for shortness of breath.   Cardiovascular:  Negative for chest pain.  Gastrointestinal:  Positive for abdominal pain, constipation, nausea and vomiting. Negative for blood in stool and diarrhea.  Genitourinary:  Negative for difficulty urinating and flank pain.  Musculoskeletal:  Negative for back pain.  Neurological:  Positive for weakness. Negative for light-headedness.  All other systems reviewed and are negative.   Physical Exam Updated Vital Signs BP (!) 153/97 (BP Location: Left Arm)   Pulse (!) 102   Temp 98 F (36.7 C)   Resp 16   SpO2 95%  Physical Exam Vitals and nursing note reviewed.  Constitutional:      Appearance: Normal appearance.  HENT:     Head: Normocephalic and atraumatic.     Mouth/Throat:     Mouth: Mucous membranes are dry.  Eyes:     Pupils: Pupils are equal, round, and reactive to light.  Cardiovascular:     Rate and Rhythm: Normal rate.  Pulmonary:     Effort: Pulmonary effort is normal.     Breath sounds: No wheezing.  Abdominal:     General: Abdomen is flat. Bowel sounds are decreased.     Palpations: Abdomen is soft.     Tenderness: There is abdominal tenderness. There is no right CVA tenderness or left CVA tenderness.     Comments: Abdomen is soft, somewhat distended, bowel sounds are decreased.  No guarding on exam.  Musculoskeletal:     Cervical back: Normal range of motion and neck supple.  Skin:    General: Skin is warm and dry.  Neurological:     Mental Status: He is alert and oriented to person, place, and time.     ED Results / Procedures / Treatments    Labs (all labs ordered are listed, but only abnormal results are displayed) Labs Reviewed  COMPREHENSIVE METABOLIC PANEL - Abnormal; Notable for the following components:      Result Value   Glucose, Bld 164 (*)    BUN 31 (*)    Creatinine, Ser 1.60 (*)    GFR, Estimated 44 (*)    All other components within normal limits  URINALYSIS, ROUTINE W REFLEX MICROSCOPIC - Abnormal; Notable for the following components:   Specific Gravity, Urine >1.046 (*)    Hgb urine dipstick LARGE (*)    Ketones, ur TRACE (*)    Protein, ur TRACE (*)    All  other components within normal limits  LIPASE, BLOOD  CBC    EKG None  Radiology CT ABDOMEN PELVIS W CONTRAST  Result Date: 07/19/2022 CLINICAL DATA:  Left lower quadrant abdominal pain since Sunday evening. EXAM: CT ABDOMEN AND PELVIS WITH CONTRAST TECHNIQUE: Multidetector CT imaging of the abdomen and pelvis was performed using the standard protocol following bolus administration of intravenous contrast. RADIATION DOSE REDUCTION: This exam was performed according to the departmental dose-optimization program which includes automated exposure control, adjustment of the mA and/or kV according to patient size and/or use of iterative reconstruction technique. CONTRAST:  18mL OMNIPAQUE IOHEXOL 300 MG/ML  SOLN COMPARISON:  06/23/2019 FINDINGS: Lower chest: The lung bases are clear of acute process. No pleural effusion or pulmonary lesions. The heart is normal in size. No pericardial effusion. Small hiatal hernia. Hepatobiliary: There are 2 low-attenuation hepatic lesions, one in segment 7 and one in segment 6. These were present on the prior study and are more difficult to see on the delayed images suggesting benign hepatic hemangiomas. No worrisome hepatic lesions or intrahepatic biliary dilatation. The gallbladder is unremarkable. No common bile duct dilatation. Pancreas: No mass, inflammation or ductal dilatation. Spleen: Normal size.  No focal lesions.  Adrenals/Urinary Tract: The adrenal glands and kidneys unremarkable. No renal lesions or hydronephrosis. There is mild left-sided bladder wall thickening and a calcification in the bladder wall. This was not present on prior CT scan recommend urology consultation. Patient may require cystoscopic evaluation. Stomach/Bowel: The stomach, duodenum, small bowel and colon are unremarkable. No acute inflammatory process, mass lesions or obstructive findings. The terminal ileum and appendix are normal. Scattered colonic diverticulosis but no findings for acute diverticulitis. Vascular/Lymphatic: Scattered atherosclerotic calcification involving the aorta and iliac arteries but no aneurysm dissection. The branch vessels are patent. The major venous structures are patent. No mesenteric or retroperitoneal mass or adenopathy. Reproductive: The prostate gland and seminal vesicles are normal. Other: No pelvic mass or adenopathy. No free pelvic fluid collections. No inguinal mass or adenopathy. Stable periumbilical abdominal wall hernia containing fat and a small amount of fluid. 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 IMPRESSION: 1. No acute abdominal/pelvic findings, mass lesions or adenopathy. 2. Mild left-sided bladder wall thickening and a calcification in the bladder wall. This was not present on prior CT scan. Recommend Urology consultation. Patient may require cystoscopic evaluation. 3. 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 4. Stable periumbilical abdominal wall hernia containing fat and a small amount of fluid. 5. 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 Aortic Atherosclerosis (ICD10-I70.0). Electronically Signed   By: Marijo Sanes M.D.   On: 07/19/2022 16:58    Procedures Procedures    Medications Ordered in ED Medications  lactated ringers bolus 500  mL (500 mLs Intravenous New Bag/Given 07/19/22 1616)  ondansetron (ZOFRAN) injection 4 mg (4 mg Intravenous Given 07/19/22 1616)  iohexol (OMNIPAQUE) 300 MG/ML solution 100 mL (70 mLs Intravenous Contrast Given 07/19/22 1638)    ED Course/ Medical Decision Making/ A&P Clinical Course as of 07/19/22 1751  Tue Jul 19, 2022  1738 Hgb urine dipstick(!): LARGE [JS]  1738 Protein(!): TRACE [JS]  1738 Ketones, ur(!): TRACE [JS]    Clinical Course User Index [JS] Janeece Fitting, PA-C                             Medical Decision Making Amount and/or Complexity of  Data Reviewed Labs: ordered. Decision-making details documented in ED Course. Radiology: ordered.  Risk Prescription drug management.   This patient presents to the ED for concern of nausea,vomiting, this involves a number of treatment options, and is a complaint that carries with it a high risk of complications and morbidity.  The differential diagnosis includes obstruction, ileus, viral illness.   Co morbidities: Discussed in HPI   Brief History:  Patient with no underlying medical history here with nausea, vomiting for the past 3 days.  Did have a colonoscopy on March 14, reports no bowel movement over the last 3 days but is passing gas.  Endorsing some lower abdominal pain describing his abdomen is feeling full, has taken some Zofran however proceeded to vomit after this.  No fevers.  EMR reviewed including pt PMHx, past surgical history and past visits to ER.   See HPI for more details   Lab Tests:  I ordered and independently interpreted labs.  The pertinent results include:    Labs notable for CBC with no leukocytosis, hemoglobin is within normal limits.  Lipase level is normal.  CMP with slight elevation in his creatinine, however improved from prior.  LFTs are within normal limits.   Imaging Studies:  CT Abdomen and pelvis: IMPRESSION:  1. No acute abdominal/pelvic findings, mass lesions or adenopathy.  2.  Mild left-sided bladder wall thickening and a calcification in  the bladder wall. This was not present on prior CT scan. Recommend  Urology consultation. Patient may require cystoscopic evaluation.  3. Stable low-attenuation hepatic lesions, likely benign hepatic  hemangiomas.  4. Stable periumbilical abdominal wall hernia containing fat and a  small amount of fluid.  5. Stable advanced degenerative lumbar spondylosis with multilevel  disc disease and facet disease.   Cardiac Monitoring:  NA NA   Medicines ordered:  I ordered medication including zofran, bolus  for symptomatic treatment Reevaluation of the patient after these medicines showed that the patient improved I have reviewed the patients home medicines and have made adjustments as needed  Reevaluation:  After the interventions noted above I re-evaluated patient and found that they have :improved   Social Determinants of Health:  The patient's social determinants of health were a factor in the care of this patient   Problem List / ED Course:  Patient here with nausea, vomiting for the past 3 days.  Does report having a endoscopy on March 14.  Did not have a bowel movement over the past 2 days and has been vomiting after eating some chicken. Arrived to the ED hemodynamically stable, afebrile, has taken some Zofran to help with nausea and vomiting without any improvement in symptoms.  Blood work on today's visit is within normal limits.  His exam is benign, some pain along the lower abdomen, appears feeling somewhat gas here and passing gas.  Is soft, no signs of acute abdomen.  CT abdomen and pelvis obtained which did not show any obstruction.  He was given Zofran, bolus to help with symptomatic treatment. Patient reevaluated, no further episodes of vomiting after 4 mg of IV Zofran, we discussed the CT findings along with follow-up with urology warranted.  Patient is hemodynamically stable for discharge.  We discussed close  follow-up with PCP along with return to the emergency department if he does not experience any improvement in symptoms.  Suspect likely viral illness, patient hemodynamically stable for discharge.   Dispostion:  After consideration of the diagnostic results and the patients response to treatment, I  feel that the patent would benefit from close follow-up with PCP, return precautions discussed at length.    Portions of this note were generated with [employer]. Dictation errors may occur despite best attempts at proofreading.   Final Clinical Impression(s) / ED Diagnoses Final diagnoses:  Nausea and vomiting, unspecified vomiting type    Rx / DC Orders ED Discharge Orders     None         Janeece Fitting, PA-C 07/19/22 1751    Sherwood Gambler, MD 07/23/22 1624

## 2022-08-31 ENCOUNTER — Ambulatory Visit: Payer: Medicare Other | Attending: Cardiology | Admitting: Student

## 2022-08-31 ENCOUNTER — Telehealth: Payer: Self-pay | Admitting: *Deleted

## 2022-08-31 DIAGNOSIS — Z01810 Encounter for preprocedural cardiovascular examination: Secondary | ICD-10-CM | POA: Diagnosis not present

## 2022-08-31 NOTE — Telephone Encounter (Signed)
   Name: Miguel Williams  DOB: 1945-10-21  MRN: [id number]  Primary Cardiologist: Lance Muss, MD   Preoperative team, please contact this patient and set up a phone call appointment for further preoperative risk assessment. Please obtain consent and complete medication review. Thank you for your help.  I confirm that guidance regarding antiplatelet and oral anticoagulation therapy has been completed and, if necessary, noted below.  None requested.    Carlos Levering, NP 08/31/2022, 11:40 AM [facility]

## 2022-08-31 NOTE — Telephone Encounter (Signed)
S/w pt to schedule telephone clearance per [employer] for today. Scheduled appt.    Patient Consent for Virtual Visit         Miguel Williams has provided verbal consent on 08/31/2022 for a virtual visit (video or telephone).   CONSENT FOR VIRTUAL VISIT FOR:  Miguel Williams  By participating in this virtual visit I agree to the following:  I hereby voluntarily request, consent and authorize [facility] and its employed or contracted physicians, physician assistants, nurse practitioners or other licensed health care professionals (the Practitioner), to provide me with telemedicine health care services (the "Services") as deemed necessary by the treating Practitioner. I acknowledge and consent to receive the Services by the Practitioner via telemedicine. I understand that the telemedicine visit will involve communicating with the Practitioner through live audiovisual communication technology and the disclosure of certain medical information by electronic transmission. I acknowledge that I have been given the opportunity to request an in-person assessment or other available alternative prior to the telemedicine visit and am voluntarily participating in the telemedicine visit.  I understand that I have the right to withhold or withdraw my consent to the use of telemedicine in the course of my care at any time, without affecting my right to future care or treatment, and that the Practitioner or I may terminate the telemedicine visit at any time. I understand that I have the right to inspect all information obtained and/or recorded in the course of the telemedicine visit and may receive copies of available information for a reasonable fee.  I understand that some of the potential risks of receiving the Services via telemedicine include:  Delay or interruption in medical evaluation due to technological equipment failure or disruption; Information transmitted may not be sufficient (e.g. poor  resolution of images) to allow for appropriate medical decision making by the Practitioner; and/or  In rare instances, security protocols could fail, causing a breach of personal health information.  Furthermore, I acknowledge that it is my responsibility to provide information about my medical history, conditions and care that is complete and accurate to the best of my ability. I acknowledge that Practitioner's advice, recommendations, and/or decision may be based on factors not within their control, such as incomplete or inaccurate data provided by me or distortions of diagnostic images or specimens that may result from electronic transmissions. I understand that the practice of medicine is not an exact science and that Practitioner makes no warranties or guarantees regarding treatment outcomes. I acknowledge that a copy of this consent can be made available to me via my patient portal [facility] MyChart), or I can request a printed copy by calling the office of [facility].    I understand that my insurance will be billed for this visit.   I have read or had this consent read to me. I understand the contents of this consent, which adequately explains the benefits and risks of the Services being provided via telemedicine.  I have been provided ample opportunity to ask questions regarding this consent and the Services and have had my questions answered to my satisfaction. I give my informed consent for the services to be provided through the use of telemedicine in my medical care

## 2022-08-31 NOTE — Telephone Encounter (Signed)
   Pre-operative Risk Assessment    Patient Name: Miguel Williams  DOB: 1946/03/30 MRN: [id number]      Request for Surgical Clearance    Procedure:   Turbt and gemeitabine instillation  Date of Surgery:  Clearance 09/05/22                                 Surgeon:  Dr. Rhoderick Moody Surgeon's Group or Practice Name:  [facility] Phone number:  [phone] Fax number:  [phone]   Type of Clearance Requested:   - Medical    Type of Anesthesia:  Not Indicated   Additional requests/questions:    Signed, Emmit Pomfret   08/31/2022, 7:26 AM

## 2022-08-31 NOTE — Progress Notes (Signed)
Virtual Visit via Telephone Note   Because of Courtland Williams's co-morbid illnesses, he is at least at moderate risk for complications without adequate follow up.  This format is felt to be most appropriate for this patient at this time.  The patient did not have access to video technology/had technical difficulties with video requiring transitioning to audio format only (telephone).  All issues noted in this document were discussed and addressed.  No physical exam could be performed with this format.  Please refer to the patient's chart for his consent to telehealth for [facility].  Evaluation Performed:  Preoperative cardiovascular risk assessment _____________   Date:  08/31/2022   Patient ID:  Miguel Williams, DOB March 11, 1946, MRN [id number] Patient Location:  Home Provider location:   Office  Primary Care Provider:  Rodrigo Ran, MD Primary Cardiologist:  Lance Muss, MD  Chief Complaint / Patient Profile   77 y.o. y/o male with a h/o coronary artery calcification seen on CT, aortic atherosclerosis, hyperlipidemia, prediabetes, chronic renal insufficiency who is pending Turbt and gemeitabine instillation  by Dr. Liliane Shi and presents today for telephonic preoperative cardiovascular risk assessment.  History of Present Illness    Miguel Williams is a 77 y.o. male who presents via audio/video conferencing for a telehealth visit today.  Pt was last seen in cardiology clinic on 01/07/2022 by Dr. Eldridge Dace.  At that time Yigit Delgrande was doing well.  The patient is now pending procedure as outlined above. Since his last visit, he is doing well from a cardiac standpoint. Patient denies shortness of breath or dyspnea on exertion. No chest pain, pressure, or tightness. Denies lower extremity edema, orthopnea, or PND. No palpitations. His activity has been limited recently secondary to having issues with his back. He normally walks >1 mile in 20-25 minutes every day.   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)    Chronic kidney disease, stage III (moderate) (HCC)    Chronic kidney disease, stage III (moderate) (HCC)    Cocaine abuse, continuous (HCC)    COPD (chronic obstructive pulmonary disease) (HCC)    Coronary atherosclerosis of native coronary artery    Esophageal reflux    Fatigue    Fungal granuloma    Hip pain    History of 2019 novel coronavirus disease (COVID-19)    History of endoscopy 02/00/2011   Hypo-osmolality and hyponatremia    Hypokalemia    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)    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)    Insomnia due to medical condition    Iron deficiency anemia secondary to blood loss (chronic)    Iron deficiency anemia, unspecified    Lability emotional    Low back pain    Moderate protein-calorie malnutrition (HCC)    Muscle wasting    Posttraumatic stress disorder    Presence of left artificial hip joint    Restlessness    Unspecified fall, subsequent encounter    Vitamin D deficiency, unspecified    Wernicke's encephalopathy 06/23/2019   Past Surgical History:  Procedure Laterality Date   CATARACT EXTRACTION  2012   COLONOSCOPY W/ ENDOSCOPIC [place]     TONSILLECTOMY  1952  Allergies  Allergies  Allergen Reactions   Allopurinol     Other reaction(s): had a bad reaction   Baclofen     Other reaction(s): hallucinations. required admit to hospital for adverse drug rxn.   Neomycin     Other reaction(s): allergic skin reaction.   Sulfonamide Derivatives    Tegretol [Carbamazepine] Hives   Sulfa Antibiotics Rash    Other reaction(s): Unknown - not noted in history    Home Medications    Prior to Admission medications   Medication Sig Start Date End Date  Taking? Authorizing Provider  acetaminophen (TYLENOL) 500 MG tablet Take 1 tablet (500 mg total) by mouth every 6 (six) hours as needed for mild pain, moderate pain or fever. 04/07/19   Jae Dire, MD  atorvastatin (LIPITOR) 40 MG tablet Take 1 tablet by mouth daily at 6 (six) AM.    [provider]  CLOBETASOL PROPIONATE E 0.05 % emollient cream Apply topically as needed. 10/21/21   [provider]  clonazePAM (KLONOPIN) 0.5 MG tablet Take 0.5 mg by mouth daily as needed. 10/21/21   [provider]  COLCRYS 0.6 MG tablet TAKE 2 TABLETS (1.2 MG TOTAL) BY MOUTH 2 (TWO) TIMES DAILY. Patient taking differently: Take 1.2 mg by mouth 2 (two) times daily. 04/04/12   Enid Baas, MD  DULoxetine (CYMBALTA) 60 MG capsule Take 60 mg by mouth daily. 03/18/19   [provider]  dutasteride (AVODART) 0.5 MG capsule Take 1 capsule (0.5 mg total) by mouth every other day. Patient taking differently: Take 0.5 mg by mouth daily. 02/12/13   Enid Baas, MD  ezetimibe (ZETIA) 10 MG tablet Take 10 mg by mouth daily. 11/01/21   [provider]  lansoprazole (PREVACID) 30 MG capsule Take 30 mg by mouth 2 (two) times daily. 10/12/21   [provider]  levothyroxine (SYNTHROID) 50 MCG tablet Take 1 tablet by mouth daily at 6 (six) AM. 12/21/21   [provider]  methocarbamol (ROBAXIN) 500 MG tablet Take 1 tablet by mouth daily at 6 (six) AM. 10/18/21   [provider]  oxyCODONE-acetaminophen (PERCOCET/ROXICET) 5-325 MG tablet Take 2 tablets by mouth every 6 (six) hours as needed for up to 10 doses for severe pain. 06/24/19   Terald Sleeper, MD  probenecid (BENEMID) 500 MG tablet Take 1 tablet (500 mg total) by mouth daily. 02/12/13   Enid Baas, MD  solifenacin (VESICARE) 5 MG tablet Take 5 mg by mouth daily. 10/12/21   [provider]  Tamsulosin HCl (FLOMAX) 0.4 MG CAPS TAKE 1 CAPSULE (0.4 MG TOTAL) BY MOUTH DAILY. Patient taking  differently: Take 0.4 mg by mouth daily. 04/04/12   Enid Baas, MD  valsartan (DIOVAN) 40 MG tablet Take 40 mg by mouth daily. 03/19/19   [provider]    Physical Exam    Vital Signs:  Rajohn Kinloch does not have vital signs available for review today.  Given telephonic nature of communication, physical exam is limited. AAOx3. NAD. Normal affect.  Speech and respirations are unlabored.  Accessory Clinical Findings    None  Assessment & Plan    Primary Cardiologist: Lance Muss, MD  Preoperative cardiovascular risk assessment. Turbt and gemeitabine instillation by Dr. Liliane Shi on 09/05/2022.  Chart reviewed as part of pre-operative protocol coverage. According to the RCRI, patient has a 0.4% risk of MACE. Patient reports activity equivalent to >4.0 METS (walks >1 mile in 20-25 minutes daily).   Given past medical history and time  since last visit, based on ACC/AHA guidelines, Bronze Dollens would be at acceptable risk for the planned procedure without further cardiovascular testing.   Patient was advised that if he develops new symptoms prior to surgery to contact our office to arrange a follow-up appointment.  he verbalized understanding.   I will route this recommendation to the requesting party via Epic fax function.  Please call with questions.  Time:   Today, I have spent 6 minutes with the patient with telehealth technology discussing medical history, symptoms, and management plan.     Carlos Levering, NP  08/31/2022, 11:59 AM

## 2022-09-01 ENCOUNTER — Encounter (HOSPITAL_BASED_OUTPATIENT_CLINIC_OR_DEPARTMENT_OTHER): Payer: Self-pay | Admitting: Urology

## 2022-09-01 ENCOUNTER — Other Ambulatory Visit: Payer: Self-pay | Admitting: Urology

## 2022-09-01 NOTE — Progress Notes (Signed)
Spoke w/ via phone for pre-op interview--- [employer]----    NONE           Lab results------Current EKG in Epic dated 01/07/22 COVID test -----patient states asymptomatic no test needed Arrive at -------0800 NPO after MN NO Solid Food.  Clear liquids from MN until---0700 Med rec completed Medications to take morning of surgery -----Cymbalta, synthroid and Zegerid Diabetic medication ----- Patient instructed no nail polish to be worn day of surgery Patient instructed to bring photo id and insurance card day of surgery Patient aware to have Driver (ride ) / caregiver   Melody Lula Olszewski for 24 hours after surgery  Patient Special Instructions ----- Pre-Op special Instructions ----- Patient verbalized understanding of instructions that were given at this phone interview. Patient denies shortness of breath, chest pain, fever, cough at this phone interview.  Cardiac clearance dated 08/31/22 in Epic and on chart.

## 2022-09-07 ENCOUNTER — Encounter (HOSPITAL_BASED_OUTPATIENT_CLINIC_OR_DEPARTMENT_OTHER)
Admission: RE | Disposition: A | Discharge status: Home / Self Care | Payer: Self-pay | Source: Home / Self Care | Attending: Urology

## 2022-09-07 ENCOUNTER — Ambulatory Visit (HOSPITAL_BASED_OUTPATIENT_CLINIC_OR_DEPARTMENT_OTHER): Payer: Medicare Other | Admitting: Anesthesiology

## 2022-09-07 ENCOUNTER — Encounter (HOSPITAL_BASED_OUTPATIENT_CLINIC_OR_DEPARTMENT_OTHER): Payer: Self-pay | Admitting: Urology

## 2022-09-07 ENCOUNTER — Other Ambulatory Visit: Payer: Self-pay

## 2022-09-07 ENCOUNTER — Ambulatory Visit (HOSPITAL_BASED_OUTPATIENT_CLINIC_OR_DEPARTMENT_OTHER)
Admission: RE | Admit: 2022-09-07 | Discharge: 2022-09-07 | Disposition: A | Discharge status: Home / Self Care | Payer: Medicare Other | Attending: Urology | Admitting: Urology

## 2022-09-07 DIAGNOSIS — Z79899 Other long term (current) drug therapy: Secondary | ICD-10-CM | POA: Insufficient documentation

## 2022-09-07 DIAGNOSIS — F419 Anxiety disorder, unspecified: Secondary | ICD-10-CM | POA: Diagnosis not present

## 2022-09-07 DIAGNOSIS — I2510 Atherosclerotic heart disease of native coronary artery without angina pectoris: Secondary | ICD-10-CM

## 2022-09-07 DIAGNOSIS — F319 Bipolar disorder, unspecified: Secondary | ICD-10-CM | POA: Diagnosis not present

## 2022-09-07 DIAGNOSIS — N183 Chronic kidney disease, stage 3 unspecified: Secondary | ICD-10-CM | POA: Diagnosis not present

## 2022-09-07 DIAGNOSIS — Z87442 Personal history of urinary calculi: Secondary | ICD-10-CM | POA: Diagnosis not present

## 2022-09-07 DIAGNOSIS — N35919 Unspecified urethral stricture, male, unspecified site: Secondary | ICD-10-CM | POA: Diagnosis not present

## 2022-09-07 DIAGNOSIS — C679 Malignant neoplasm of bladder, unspecified: Secondary | ICD-10-CM | POA: Diagnosis not present

## 2022-09-07 DIAGNOSIS — K219 Gastro-esophageal reflux disease without esophagitis: Secondary | ICD-10-CM | POA: Insufficient documentation

## 2022-09-07 DIAGNOSIS — D494 Neoplasm of unspecified behavior of bladder: Secondary | ICD-10-CM | POA: Diagnosis present

## 2022-09-07 DIAGNOSIS — I10 Essential (primary) hypertension: Secondary | ICD-10-CM

## 2022-09-07 DIAGNOSIS — I129 Hypertensive chronic kidney disease with stage 1 through stage 4 chronic kidney disease, or unspecified chronic kidney disease: Secondary | ICD-10-CM | POA: Diagnosis not present

## 2022-09-07 DIAGNOSIS — C672 Malignant neoplasm of lateral wall of bladder: Secondary | ICD-10-CM | POA: Diagnosis not present

## 2022-09-07 DIAGNOSIS — J449 Chronic obstructive pulmonary disease, unspecified: Secondary | ICD-10-CM | POA: Diagnosis not present

## 2022-09-07 DIAGNOSIS — Z0000 Encounter for general adult medical examination without abnormal findings: Secondary | ICD-10-CM

## 2022-09-07 HISTORY — DX: Essential (primary) hypertension: I10

## 2022-09-07 HISTORY — DX: Malignant (primary) neoplasm, unspecified: C80.1

## 2022-09-07 LAB — POCT I-STAT, CHEM 8
BUN: 39 mg/dL — ABNORMAL HIGH (ref 8–23)
Calcium, Ion: 1.18 mmol/L (ref 1.15–1.40)
Chloride: 110 mmol/L (ref 98–111)
Creatinine, Ser: 1.6 mg/dL — ABNORMAL HIGH (ref 0.61–1.24)
Glucose, Bld: 109 mg/dL — ABNORMAL HIGH (ref 70–99)
HCT: 38 % — ABNORMAL LOW (ref 39.0–52.0)
Hemoglobin: 12.9 g/dL — ABNORMAL LOW (ref 13.0–17.0)
Potassium: 4.2 mmol/L (ref 3.5–5.1)
Sodium: 143 mmol/L (ref 135–145)
TCO2: 20 mmol/L — ABNORMAL LOW (ref 22–32)

## 2022-09-07 SURGERY — TURBT, WITH CHEMOTHERAPEUTIC AGENT INSTILLATION INTO BLADDER
Anesthesia: General | Site: Bladder

## 2022-09-07 MED ORDER — ACETAMINOPHEN 500 MG PO TABS
1000.0000 mg | ORAL_TABLET | Freq: Once | ORAL | Status: AC
  Administered 2022-09-07: 1000 mg via ORAL

## 2022-09-07 MED ORDER — ONDANSETRON HCL 4 MG/2ML IJ SOLN: 4.0000 mg | Freq: Once | INTRAMUSCULAR | Status: DC | PRN

## 2022-09-07 MED ORDER — FENTANYL CITRATE (PF) 100 MCG/2ML IJ SOLN
INTRAMUSCULAR | Status: AC
  Filled 2022-09-07: qty 2

## 2022-09-07 MED ORDER — ACETAMINOPHEN 500 MG PO TABS
ORAL_TABLET | ORAL | Status: AC
  Filled 2022-09-07: qty 2

## 2022-09-07 MED ORDER — GEMCITABINE CHEMO FOR BLADDER INSTILLATION 2000 MG
2000.0000 mg | Freq: Once | INTRAVENOUS | Status: AC
  Administered 2022-09-07: 2000 mg via INTRAVESICAL
  Filled 2022-09-07: qty 52.6

## 2022-09-07 MED ORDER — CEFAZOLIN SODIUM-DEXTROSE 2-4 GM/100ML-% IV SOLN
2.0000 g | INTRAVENOUS | Status: AC
  Administered 2022-09-07: 2 g via INTRAVENOUS

## 2022-09-07 MED ORDER — OXYCODONE-ACETAMINOPHEN 5-325 MG PO TABS: 2.0000 | ORAL_TABLET | Freq: Four times a day (QID) | ORAL | 0 refills | Status: DC | PRN

## 2022-09-07 MED ORDER — HYDROMORPHONE HCL 1 MG/ML IJ SOLN
0.2500 mg | INTRAMUSCULAR | Status: DC | PRN
  Administered 2022-09-07 (×2): 0.25 mg via INTRAVENOUS

## 2022-09-07 MED ORDER — SUGAMMADEX SODIUM 200 MG/2ML IV SOLN
INTRAVENOUS | Status: DC | PRN
  Administered 2022-09-07: 200 mg via INTRAVENOUS

## 2022-09-07 MED ORDER — SODIUM CHLORIDE 0.9 % IV SOLN: INTRAVENOUS | Status: DC

## 2022-09-07 MED ORDER — CEPHALEXIN 500 MG PO CAPS: 500.0000 mg | ORAL_CAPSULE | Freq: Two times a day (BID) | ORAL | 0 refills | Status: AC

## 2022-09-07 MED ORDER — STERILE WATER FOR IRRIGATION IR SOLN
Status: DC | PRN
  Administered 2022-09-07: 500 mL

## 2022-09-07 MED ORDER — OXYCODONE HCL 5 MG/5ML PO SOLN: 5.0000 mg | Freq: Once | ORAL | Status: AC | PRN

## 2022-09-07 MED ORDER — LIDOCAINE HCL (PF) 2 % IJ SOLN
INTRAMUSCULAR | Status: AC
  Filled 2022-09-07: qty 5

## 2022-09-07 MED ORDER — DEXAMETHASONE SODIUM PHOSPHATE 10 MG/ML IJ SOLN
INTRAMUSCULAR | Status: AC
  Filled 2022-09-07: qty 1

## 2022-09-07 MED ORDER — SODIUM CHLORIDE 0.9 % IR SOLN
Status: DC | PRN
  Administered 2022-09-07: 6000 mL

## 2022-09-07 MED ORDER — ONDANSETRON HCL 4 MG/2ML IJ SOLN
INTRAMUSCULAR | Status: AC
  Filled 2022-09-07: qty 2

## 2022-09-07 MED ORDER — HYDROMORPHONE HCL 1 MG/ML IJ SOLN
INTRAMUSCULAR | Status: AC
  Filled 2022-09-07: qty 1

## 2022-09-07 MED ORDER — PHENAZOPYRIDINE HCL 200 MG PO TABS: 200.0000 mg | ORAL_TABLET | Freq: Three times a day (TID) | ORAL | 0 refills | Status: DC | PRN

## 2022-09-07 MED ORDER — AMISULPRIDE (ANTIEMETIC) 5 MG/2ML IV SOLN: 10.0000 mg | Freq: Once | INTRAVENOUS | Status: DC | PRN

## 2022-09-07 MED ORDER — FENTANYL CITRATE (PF) 100 MCG/2ML IJ SOLN
INTRAMUSCULAR | Status: DC | PRN
  Administered 2022-09-07 (×2): 25 ug via INTRAVENOUS
  Administered 2022-09-07: 50 ug via INTRAVENOUS
  Administered 2022-09-07 (×4): 25 ug via INTRAVENOUS

## 2022-09-07 MED ORDER — LACTATED RINGERS IV SOLN: INTRAVENOUS | Status: DC

## 2022-09-07 MED ORDER — PROPOFOL 10 MG/ML IV BOLUS
INTRAVENOUS | Status: DC | PRN
  Administered 2022-09-07: 200 mg via INTRAVENOUS

## 2022-09-07 MED ORDER — ROCURONIUM BROMIDE 100 MG/10ML IV SOLN
INTRAVENOUS | Status: DC | PRN
  Administered 2022-09-07: 50 mg via INTRAVENOUS

## 2022-09-07 MED ORDER — LIDOCAINE HCL (CARDIAC) PF 100 MG/5ML IV SOSY
PREFILLED_SYRINGE | INTRAVENOUS | Status: DC | PRN
  Administered 2022-09-07: 60 mg via INTRAVENOUS

## 2022-09-07 MED ORDER — PROPOFOL 10 MG/ML IV BOLUS
INTRAVENOUS | Status: AC
  Filled 2022-09-07: qty 20

## 2022-09-07 MED ORDER — OXYCODONE HCL 5 MG PO TABS
5.0000 mg | ORAL_TABLET | Freq: Once | ORAL | Status: AC | PRN
  Administered 2022-09-07: 5 mg via ORAL

## 2022-09-07 MED ORDER — CEFAZOLIN SODIUM-DEXTROSE 2-4 GM/100ML-% IV SOLN
INTRAVENOUS | Status: AC
  Filled 2022-09-07: qty 100

## 2022-09-07 MED ORDER — DEXAMETHASONE SODIUM PHOSPHATE 4 MG/ML IJ SOLN
INTRAMUSCULAR | Status: DC | PRN
  Administered 2022-09-07: 5 mg via INTRAVENOUS

## 2022-09-07 MED ORDER — ROCURONIUM BROMIDE 10 MG/ML (PF) SYRINGE
PREFILLED_SYRINGE | INTRAVENOUS | Status: AC
  Filled 2022-09-07: qty 10

## 2022-09-07 MED ORDER — OXYCODONE HCL 5 MG PO TABS
ORAL_TABLET | ORAL | Status: AC
  Filled 2022-09-07: qty 1

## 2022-09-07 MED ORDER — ONDANSETRON HCL 4 MG/2ML IJ SOLN
INTRAMUSCULAR | Status: DC | PRN
  Administered 2022-09-07: 4 mg via INTRAVENOUS

## 2022-09-07 SURGICAL SUPPLY — 23 items
BAG DRAIN URO-CYSTO SKYTR STRL (DRAIN) ×1 IMPLANT
BAG DRN RND TRDRP ANRFLXCHMBR (UROLOGICAL SUPPLIES) ×1
BAG DRN UROCATH (DRAIN) ×1
BAG URINE DRAIN 2000ML AR STRL (UROLOGICAL SUPPLIES) IMPLANT
BAG URINE LEG 500ML (DRAIN) IMPLANT
CATH FOLEY 2WAY SLVR  5CC 18FR (CATHETERS) ×1
CATH FOLEY 2WAY SLVR 5CC 18FR (CATHETERS) IMPLANT
GLOVE BIO SURGEON STRL SZ7.5 (GLOVE) ×1 IMPLANT
GOWN STRL REUS W/TWL XL LVL3 (GOWN DISPOSABLE) ×1 IMPLANT
HOLDER FOLEY CATH W/STRAP (MISCELLANEOUS) IMPLANT
IV NS IRRIG 3000ML ARTHROMATIC (IV SOLUTION) ×1 IMPLANT
KIT TURNOVER CYSTO (KITS) ×1 IMPLANT
LOOP CUT BIPOLAR 24F LRG (ELECTROSURGICAL) IMPLANT
MANIFOLD NEPTUNE II (INSTRUMENTS) ×1 IMPLANT
NS IRRIG 500ML POUR BTL (IV SOLUTION) IMPLANT
PACK CYSTO (CUSTOM PROCEDURE TRAY) ×1 IMPLANT
SLEEVE SCD COMPRESS KNEE MED (STOCKING) ×1 IMPLANT
SYR TOOMEY IRRIG 70ML (MISCELLANEOUS) ×1
SYRINGE TOOMEY IRRIG 70ML (MISCELLANEOUS) IMPLANT
TUBE CONNECTING 12X1/4 (SUCTIONS) ×1 IMPLANT
TUBING UROLOGY SET (TUBING) ×1 IMPLANT
WATER STERILE IRR 3000ML UROMA (IV SOLUTION) IMPLANT
WATER STERILE IRR 500ML POUR (IV SOLUTION) IMPLANT

## 2022-09-07 NOTE — Anesthesia Postprocedure Evaluation (Signed)
Anesthesia Post Note  Patient: Miguel Williams  Procedure(s) Performed: TRANSURETHRAL RESECTION OF BLADDER TUMOR (TURBT) with GEMCITABINE (Bladder)     Patient location during evaluation: PACU Anesthesia Type: General Level of consciousness: awake and alert, oriented and patient cooperative Pain management: pain level controlled Vital Signs Assessment: post-procedure vital signs reviewed and stable Respiratory status: spontaneous breathing, nonlabored ventilation and respiratory function stable Cardiovascular status: blood pressure returned to baseline and stable Postop Assessment: no apparent nausea or vomiting Anesthetic complications: no   No notable events documented.  Last Vitals:  Vitals:   09/07/22 1115 09/07/22 1130  BP: (!) 150/87 (!) 156/95  Pulse: 91 93  Resp: 17 13  Temp:    SpO2: 100% 100%    Last Pain:  Vitals:   09/07/22 1130  TempSrc:   PainSc: 4                  Lannie Fields

## 2022-09-07 NOTE — Op Note (Signed)
Operative Note  Preoperative diagnosis:  1.  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  Stenosis of the membranous urethra  Procedure(s): 1.  TURBT medium 2.  Intravesical instillation of gemcitabine  Surgeon: Rhoderick Moody, MD  Assistants:  None  Anesthesia:  General  Complications:  None  EBL: 5 mL  Specimens: 1.  Superficial and deep margins of left upper sidewall bladder tumor  Drains/Catheters: 1.  18 French Foley catheter with 10 mill of sterile water in the balloon  Intraoperative findings:   Stenosis of the membranous urethra.  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  He has been consented for the above procedures, voices understanding and wishes to proceed.  Description of procedure:  The patient was taken to the operating room and administered general anesthesia. They were then placed on the table and moved to the dorsal lithotomy position after which the genitalia was sterilely prepped and draped. An official timeout was then performed.  The 90 French resectoscope with the 30 lens and visual obturator were then passed into the bladder under direct visualization.  There was stenosis at the membranous urethra and the resectoscope sheath because a small degree of trauma in that area.  The visual obturator was then removed and the Gyrus resectoscope element with 30  lens was then inserted and the bladder was fully and systematically inspected. Ureteral orifices were noted to be in the  normal anatomic positions.   The superficial 2 cm bladder tumor involving the left upper sidewall was then resected with superficial and deep margin sent separately for pathologic analysis.  Reinspection of the bladder revealed all obvious tumor had been fully resected and there was no evidence of perforation. The Microvasive evacuator was then used to irrigate the bladder and remove all of the portions of bladder tumor which were sent to pathology. I then removed the resectoscope.  An 36 French Foley catheter was then inserted in the bladder and irrigated. The irrigant returned slightly pink with no clots. The patient was awakened and taken to the recovery room.  While in the recovery room 2000 mg of gemcitabine in 50 mL of water was instilled in the bladder through the catheter and the catheter was plugged. This will remain indwelling for approximately one hour. It will then be drained from the bladder.    Plan:  The patient will be discharged home with his Foley catheter with plans for catheter removal in the office on 09/12/2022

## 2022-09-07 NOTE — Anesthesia Procedure Notes (Signed)
Procedure Name: Intubation Date/Time: 09/07/2022 10:05 AM  Performed by: Jessica Priest, CRNAPre-anesthesia Checklist: Patient identified, Emergency Drugs available, Suction available, Patient being monitored and Timeout performed Patient Re-evaluated:Patient Re-evaluated prior to induction Oxygen Delivery Method: Circle system utilized Preoxygenation: Pre-oxygenation with 100% oxygen Induction Type: IV induction Ventilation: Mask ventilation without difficulty Laryngoscope Size: Mac and 4 Grade View: Grade II Tube type: Oral Tube size: 7.5 mm Number of attempts: 1 Airway Equipment and Method: Stylet and Oral airway Placement Confirmation: ETT inserted through vocal cords under direct vision, positive ETCO2, breath sounds checked- equal and bilateral and CO2 detector Secured at: 23 cm Tube secured with: Tape Dental Injury: Teeth and Oropharynx as per pre-operative assessment

## 2022-09-07 NOTE — H&P (Signed)
Office Visit Report     08/29/2022   --------------------------------------------------------------------------------   Miguel Williams  MRN: [id number]  DOB: 1945-11-17, 77 year old Male  PRIMARY CARE:  Mark A. Perini, MD  PRIMARY CARE FAX:  [phone]  REFERRING:  Cristal Deer A. Liliane Shi, MD  PROVIDER:  Rhoderick Moody, M.D.  LOCATION:  [facility] [phone]     --------------------------------------------------------------------------------   CC/HPI: CC: Hematuria   HPI: Miguel Williams is a 77 year old male referred by Dr. Waynard Edwards for a hematuria evaluation.   -History of microscopic hematuria on multiple occasions. Recent CT abd/pel w contrast revealed a bladder calcification, with no other GU abnormalities  -Smoking history: non-smoker  -History of nephrolithiasis: 2 prior stone episodes that he passed w/o surgery  -History of CKD: denies  -Anti-coagulant/anti-platelet use: denies  -Hematologic disorders: denies  -History of UTI: 1 prior UTI, which was earlier this year that was alleviated with a course of abx  -History of GU surgery/trauma: denies  -Personal/family history of GU malignancies: denies  -Currently on tamulsoin BID and finasteride. From a urinary standpoint, he reports a good FOS and feels like he is emptying his bladder well. He has occasional urgency/frequency, but is not bothered by it. Nocturia x 1.   08/29/2022: The patient is here today for cystoscopy to evaluate the calcification seen within his bladder on recent CT.     ALLERGIES: Baclofen Neosporin Sulfa Drugs    MEDICATIONS: Tamsulosin Hcl 0.4 mg capsule  Aspirin Ec 81 mg tablet, delayed release 0 Oral  Atorvastatin Calcium 40 mg tablet  Clobetasol Emollient 0.05 % cream  Clonazepam 0.5 mg tablet  Colchicine 0.6 mg tablet  Dexilant 30 mg capsule, delayed release, biphasic  Duloxetine Hcl 60 mg capsule,delayed release  Dutasteride 0.5 mg capsule  Fiber  Levoxyl 50 mcg  tablet  Melatonin  Ondansetron Hcl 8 mg tablet  Probenecid 500 mg tablet  Solifenacin Succinate 5 mg tablet  Triamcinolone Acetonide 0.1 % cream  Valsartan 80 mg tablet  Zetia 10 mg tablet     GU PSH: No GU PSH      PSH Notes: Cataract Surgery, Complete Colonoscopy, Tonsillectomy   NON-GU PSH: Cataract surgery, 2013 Diagnostic Colonoscopy - 2010 Remove Tonsils - 2008     GU PMH: BPH w/o LUTS - 08/18/2022 Microscopic hematuria - 08/18/2022 Ureteral calculus - 2020 Nocturia - 2018 BPH w/LUTS, 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 No stone was seen on imaging but it was felt that he likely had a small distal stone and was treated as such.   2) BPH/LUTS: He was previously followed by Dr. Isabel Caprice for BPH and LUTS.   Current treatment: Dutasteride 0.5 mg, Tamsulosin 0.4 mg  Prior treatment: Vesicare   NON-GU PMH: Asthma, Asthma - 2014 Arthritis Depression GERD Gout Hypercholesterolemia Hypertension    FAMILY HISTORY: multiple myeloma - Father   SOCIAL HISTORY: Marital Status: Single Preferred Language: English; Ethnicity: Not Hispanic Or Latino; Race: White Current Smoking Status: Patient has never smoked.   Tobacco Use Assessment Completed: Used Tobacco in last 30 days? Light Drinker.  Drinks 1 caffeinated drink per day. 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     Notes: Never A Smoker, Occupation:, Alcohol  Use, Death In The Family Mother, Tobacco Use, Marital History - Single, Caffeine Use, Death In The Family Father   REVIEW OF SYSTEMS:    GU Review Male:   Patient denies frequent urination, hard to postpone urination, burning/ pain with urination, get  up at night to urinate, leakage of urine, stream starts and stops, trouble starting your stream, have to strain to urinate , erection problems, and penile pain.  Gastrointestinal (Upper):   Patient denies nausea, vomiting, and indigestion/ heartburn.  Gastrointestinal (Lower):   Patient denies diarrhea and constipation.  Constitutional:   Patient denies fever, night sweats, weight loss, and fatigue.  Skin:   Patient denies skin rash/ lesion and itching.  Eyes:   Patient denies blurred vision and double vision.  Ears/ Nose/ Throat:   Patient denies sore throat and sinus problems.  Hematologic/Lymphatic:   Patient denies swollen glands and easy bruising.  Cardiovascular:   Patient denies leg swelling and chest pains.  Respiratory:   Patient denies cough and shortness of breath.  Endocrine:   Patient denies excessive thirst.  Musculoskeletal:   Patient denies back pain and joint pain.  Neurological:   Patient denies headaches and dizziness.  Psychologic:   Patient denies depression and anxiety.   VITAL SIGNS:      08/29/2022 04:14 PM  Weight 192 lb / 87.09 kg  Height 68 in / 172.72 cm  BP 122/81 mmHg  Pulse 112 /min  BMI 29.2 kg/m   PAST DATA REVIEW: None   PROCEDURES:         Flexible Cystoscopy - 52000  Risks, benefits, and some of the potential complications of the procedure were discussed at length with the patient including infection, bleeding, voiding discomfort, urinary retention, fever, chills, sepsis, and others. All questions were answered. Informed consent was obtained. Antibiotic prophylaxis was given. Sterile technique and intraurethral analgesia were used.  Meatus:  Normal size. Normal location. Normal condition.  Urethra:  No strictures.  External Sphincter:  Normal.  Verumontanum:  Normal.  Prostate:  Non-obstructing. No hyperplasia.  Bladder Neck:  Non-obstructing.  Ureteral Orifices:  Normal location. Normal size. Normal shape. Effluxed clear urine.  Bladder:  A few  left lateral wall tumors. 2 cm tumor. No trabeculation. Normal mucosa. No stones.      The lower urinary tract was carefully examined. The procedure was well-tolerated and without complications. Antibiotic instructions were given. Instructions were given to call the office immediately for bloody urine, difficulty urinating, urinary retention, painful or frequent urination, fever, chills, nausea, vomiting or other illness. The patient stated that he understood these instructions and would comply with them.   ASSESSMENT:      ICD-10 Details  1 GU:   Bladder tumor/neoplasm - D41.4 Undiagnosed New Problem     PLAN:           Schedule Return Visit/Planned Activity: Next Available Appointment - Schedule Surgery          Document Letter(s):  Created for Patient: Clinical Summary   Created for [facility] A. 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  -The risks, benefits and alternatives of cystoscopy with TURBT with gemcitabine instillation was discussed with the patient. The risks include, but are not limited to, bleeding, urinary tract infection, bladder perforation requiring prolonged catheterization and/or open bladder repair, ureteral obstruction, voiding dysfunction and  the inherent risks of general anesthesia. The patient voices understanding and wishes to proceed.

## 2022-09-07 NOTE — Anesthesia Preprocedure Evaluation (Addendum)
Anesthesia Evaluation  Patient identified by MRN, date of birth, ID band Patient awake    Reviewed: Allergy & Precautions, NPO status , Patient's Chart, lab work & pertinent test results  Airway Mallampati: III  TM Distance: >3 FB Neck ROM: Full    Dental  (+) Teeth Intact, Dental Advisory Given   Pulmonary COPD   Pulmonary exam normal breath sounds clear to auscultation       Cardiovascular hypertension, Pt. on medications + CAD  Normal cardiovascular exam Rhythm:Regular Rate:Normal     Neuro/Psych  PSYCHIATRIC DISORDERS Anxiety  Bipolar Disorder   negative neurological ROS     GI/Hepatic ,GERD  Controlled and Medicated,,(+)     substance abuse  cocaine use, Hepatitis -, C  Endo/Other  negative endocrine ROS    Renal/GU CRFRenal diseaseCKD 3 Bladder dysfunction (bladder tumor)      Musculoskeletal negative musculoskeletal ROS (+)    Abdominal  (+) + obese  Peds  Hematology negative hematology ROS (+)   Anesthesia Other Findings   Reproductive/Obstetrics negative OB ROS                             Anesthesia Physical Anesthesia Plan  ASA: 3  Anesthesia Plan: General   Post-op Pain Management: Tylenol PO (pre-op)*   Induction: Intravenous  PONV Risk Score and Plan: 2 and Ondansetron, Dexamethasone and Treatment may vary due to age or medical condition  Airway Management Planned: Oral ETT  Additional Equipment: None  Intra-op Plan:   Post-operative Plan: Extubation in OR  Informed Consent: I have reviewed the patients History and Physical, chart, labs and discussed the procedure including the risks, benefits and alternatives for the proposed anesthesia with the patient or authorized representative who has indicated his/her understanding and acceptance.     Dental advisory given  Plan Discussed with: CRNA  Anesthesia Plan Comments:         Anesthesia Quick  Evaluation

## 2022-09-07 NOTE — Transfer of Care (Signed)
Immediate Anesthesia Transfer of Care Note  Patient: Miguel Williams  Procedure(s) Performed: Procedure(s) (LRB): TRANSURETHRAL RESECTION OF BLADDER TUMOR (TURBT) with GEMCITABINE (N/A)  Patient Location: PACU  Anesthesia Type: General  Level of Consciousness: awake, sedated, patient cooperative and responds to stimulation  Airway & Oxygen Therapy: Patient Spontanous Breathing and Patient connected to [facility] oxygen  Post-op Assessment: Report given to PACU RN, Post -op Vital signs reviewed and stable and Patient moving all extremities  Post vital signs: Reviewed and stable  Complications: No apparent anesthesia complications

## 2022-09-07 NOTE — Discharge Instructions (Signed)

## 2022-09-08 LAB — SURGICAL PATHOLOGY

## 2022-09-12 ENCOUNTER — Other Ambulatory Visit (HOSPITAL_COMMUNITY): Payer: Self-pay | Admitting: Family Medicine

## 2022-09-12 DIAGNOSIS — R112 Nausea with vomiting, unspecified: Secondary | ICD-10-CM

## 2022-09-12 DIAGNOSIS — D494 Neoplasm of unspecified behavior of bladder: Secondary | ICD-10-CM | POA: Insufficient documentation

## 2022-09-13 ENCOUNTER — Ambulatory Visit (HOSPITAL_COMMUNITY)
Admission: RE | Admit: 2022-09-13 | Discharge: 2022-09-13 | Disposition: A | Discharge status: Home / Self Care | Payer: Medicare Other | Source: Ambulatory Visit | Attending: Family Medicine | Admitting: Family Medicine

## 2022-09-13 DIAGNOSIS — R112 Nausea with vomiting, unspecified: Secondary | ICD-10-CM | POA: Diagnosis present

## 2022-11-09 DIAGNOSIS — N390 Urinary tract infection, site not specified: Secondary | ICD-10-CM | POA: Insufficient documentation

## 2022-11-27 DIAGNOSIS — N35912 Unspecified bulbous urethral stricture, male: Secondary | ICD-10-CM | POA: Insufficient documentation

## 2023-03-02 ENCOUNTER — Other Ambulatory Visit: Payer: Self-pay | Admitting: Urology

## 2023-03-05 NOTE — Patient Instructions (Signed)
SURGICAL WAITING ROOM VISITATION Patients having surgery or a procedure may have no more than 2 support people in the waiting area - these visitors may rotate in the visitor waiting room.   Due to an increase in RSV and influenza rates and associated hospitalizations, children ages 32 and under may not visit patients in [facility] hospitals. If the patient needs to stay at the hospital during part of their recovery, the visitor guidelines for inpatient rooms apply.  PRE-OP VISITATION  Pre-op nurse will coordinate an appropriate time for 1 support person to accompany the patient in pre-op.  This support person may not rotate.  This visitor will be contacted when the time is appropriate for the visitor to come back in the pre-op area.  Please refer to the [facility] website for the visitor guidelines for Inpatients (after your surgery is over and you are in a regular room).  You are not required to quarantine at this time prior to your surgery. However, you must do this: Hand Hygiene often Do NOT share personal items Notify your provider if you are in close contact with someone who has COVID or you develop fever 100.4 or greater, new onset of sneezing, cough, sore throat, shortness of breath or body aches.  If you test positive for Covid or have been in contact with anyone that has tested positive in the last 10 days please notify you surgeon.    Your procedure is scheduled on:  Wednesday  March 08, 2023  Report to [facility] Main Entrance: Leota Jacobsen entrance where the [employer] is available.   Report to admitting at:  11:30  AM  Call this number if you have any questions or problems the morning of surgery [phone]  DO NOT EAT OR DRINK ANYTHING AFTER MIDNIGHT THE NIGHT PRIOR TO YOUR SURGERY / PROCEDURE.   FOLLOW  ANY ADDITIONAL PRE OP INSTRUCTIONS YOU RECEIVED FROM YOUR SURGEON'S OFFICE!!!   Oral Hygiene is also important to reduce your risk of infection.         Remember - BRUSH YOUR TEETH THE MORNING OF SURGERY WITH YOUR REGULAR TOOTHPASTE  Do NOT smoke after Midnight the night before surgery.  STOP TAKING all Vitamins, Herbs and supplements 1 week before your surgery.   Take ONLY these medicines the morning of surgery with A SIP OF WATER: Duloxetine (Cymbalta), Levothyroxine, Lansoprazole (Prevacid) Zegerid, probenicid, Colcrys                    You may not have any metal on your body including , jewelry, and body piercing  Do not wear lotions, powders, cologne, or deodorant  Men may shave face and neck.  Contacts, Hearing Aids, dentures or bridgework may not be worn into surgery. DENTURES WILL BE REMOVED PRIOR TO SURGERY PLEASE DO NOT APPLY "Poly grip" OR ADHESIVES!!!   Patients discharged on the day of surgery will not be allowed to drive home.  Someone NEEDS to stay with you for the first 24 hours after anesthesia.  Do not bring your home medications to the hospital. The Pharmacy will dispense medications listed on your medication list to you during your admission in the Hospital.  Special Instructions: Bring a copy of your healthcare power of attorney and living will documents the day of surgery, if you wish to have them scanned into your [facility] Records- EPIC  Please read over the following fact sheets you were given: IF YOU HAVE QUESTIONS ABOUT YOUR PRE-OP INSTRUCTIONS, PLEASE  CALL [phone].   [facility] - Preparing for Surgery Before surgery, you can play an important role.  Because skin is not sterile, your skin needs to be as free of germs as possible.  You can reduce the number of germs on your skin by washing with CHG (chlorahexidine gluconate) soap before surgery.  CHG is an antiseptic cleaner which kills germs and bonds with the skin to continue killing germs even after washing. Please DO NOT use if you have an allergy to CHG or antibacterial soaps.  If your skin becomes reddened/irritated stop using the CHG and  inform your nurse when you arrive at Short Stay. Do not shave (including legs and underarms) for at least 48 hours prior to the first CHG shower.  You may shave your face/neck.  Please follow these instructions carefully:  1.  Shower with CHG Soap the night before surgery and the  morning of surgery.  2.  If you choose to wash your hair, wash your hair first as usual with your normal  shampoo.  3.  After you shampoo, rinse your hair and body thoroughly to remove the shampoo.                             4.  Use CHG as you would any other liquid soap.  You can apply chg directly to the skin and wash.  Gently with a scrungie or clean washcloth.  5.  Apply the CHG Soap to your body ONLY FROM THE NECK DOWN.   Do not use on face/ open                           Wound or open sores. Avoid contact with eyes, ears mouth and genitals (private parts).                       Wash face,  Genitals (private parts) with your normal soap.             6.  Wash thoroughly, paying special attention to the area where your  surgery  will be performed.  7.  Thoroughly rinse your body with warm water from the neck down.  8.  DO NOT shower/wash with your normal soap after using and rinsing off the CHG Soap.            9.  Pat yourself dry with a clean towel.            10.  Wear clean pajamas.            11.  Place clean sheets on your bed the night of your first shower and do not  sleep with pets.  ON THE DAY OF SURGERY : Do not apply any lotions/deodorants the morning of surgery.  Please wear clean clothes to the hospital/surgery center.    FAILURE TO FOLLOW THESE INSTRUCTIONS MAY RESULT IN THE CANCELLATION OF YOUR SURGERY  PATIENT SIGNATURE_________________________________  NURSE SIGNATURE__________________________________  ________________________________________________________________________

## 2023-03-05 NOTE — Progress Notes (Signed)
COVID Vaccine received:  []  No [x]  Yes Date of any COVID positive Test in last 90 days:  None  PCP - Rodrigo Ran, MD  Cardiologist - Everette Rank, MD  [facility]-  Tommi Emery   Chest x-ray -  EKG -  01-07-2022  Epic      03-07-2023  Epic Stress Test -  ECHO -  Cardiac Cath -   PCR screen: []  Ordered & Completed []   No Order but Needs PROFEND     [x]   N/A for this surgery  Surgery Plan:  [x]  Ambulatory   []  Outpatient in bed  []  Admit Anesthesia:    [x]  General  []  Spinal  []   Choice []   MAC  Bowel Prep - [x]  No  []   Yes ______  Pacemaker / ICD device [x]  No []  Yes   Spinal Cord Stimulator:[x]  No []  Yes       History of Sleep Apnea? [x]  No []  Yes   CPAP used?- [x]  No []  Yes    Does the patient monitor blood sugar?   [x]  N/A   []  No []  Yes  Patient has: [x]  NO Hx DM   []  Pre-DM   []  DM1  []   DM2  Blood Thinner / Instructions:  none Aspirin Instructions:  none  ERAS Protocol Ordered: [x]  No  []  Yes Patient is to be NPO after: midnight prior  Dental hx: []  Dentures:  [x]  N/A      []  Bridge or Partial:                   []  Loose or Damaged teeth:   Comments:   Activity level: Patient is able  to climb a flight of stairs without difficulty; [x]  No CP  but would have some SOB.   Patient can perform ADLs without assistance.   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  Patient verbalized understanding and agreement to the Pre-Surgical Instructions that were given to them at this PAT appointment. Patient was also educated of the need to review these PAT instructions again prior to his surgery.I reviewed the appropriate phone numbers to call if they have any and questions or concerns.

## 2023-03-07 ENCOUNTER — Other Ambulatory Visit: Payer: Self-pay

## 2023-03-07 ENCOUNTER — Encounter (HOSPITAL_COMMUNITY)
Admission: RE | Admit: 2023-03-07 | Discharge: 2023-03-07 | Disposition: A | Discharge status: Home / Self Care | Payer: Medicare Other | Source: Ambulatory Visit | Attending: Urology

## 2023-03-07 ENCOUNTER — Encounter (HOSPITAL_COMMUNITY): Payer: Self-pay

## 2023-03-07 VITALS — BP 142/85 | HR 102 | Temp 98.7°F | Resp 18 | Ht 68.0 in | Wt 195.0 lb

## 2023-03-07 DIAGNOSIS — K769 Liver disease, unspecified: Secondary | ICD-10-CM

## 2023-03-07 DIAGNOSIS — I129 Hypertensive chronic kidney disease with stage 1 through stage 4 chronic kidney disease, or unspecified chronic kidney disease: Secondary | ICD-10-CM | POA: Insufficient documentation

## 2023-03-07 DIAGNOSIS — N183 Chronic kidney disease, stage 3 unspecified: Secondary | ICD-10-CM | POA: Diagnosis not present

## 2023-03-07 DIAGNOSIS — J449 Chronic obstructive pulmonary disease, unspecified: Secondary | ICD-10-CM | POA: Insufficient documentation

## 2023-03-07 DIAGNOSIS — I10 Essential (primary) hypertension: Secondary | ICD-10-CM

## 2023-03-07 DIAGNOSIS — Z01818 Encounter for other preprocedural examination: Secondary | ICD-10-CM

## 2023-03-07 DIAGNOSIS — C679 Malignant neoplasm of bladder, unspecified: Secondary | ICD-10-CM | POA: Diagnosis not present

## 2023-03-07 HISTORY — DX: Nausea with vomiting, unspecified: Z98.890

## 2023-03-07 HISTORY — DX: Pneumonia, unspecified organism: J18.9

## 2023-03-07 HISTORY — DX: Other psychoactive substance abuse, uncomplicated: F19.10

## 2023-03-07 HISTORY — DX: Personal history of urinary calculi: Z87.442

## 2023-03-07 HISTORY — DX: Nausea with vomiting, unspecified: R11.2

## 2023-03-07 HISTORY — DX: Unspecified osteoarthritis, unspecified site: M19.90

## 2023-03-07 HISTORY — DX: Bipolar disorder, unspecified: F31.9

## 2023-03-07 LAB — COMPREHENSIVE METABOLIC PANEL
ALT: 33 U/L (ref 0–44)
AST: 29 U/L (ref 15–41)
Albumin: 4.2 g/dL (ref 3.5–5.0)
Alkaline Phosphatase: 68 U/L (ref 38–126)
Anion gap: 9 (ref 5–15)
BUN: 34 mg/dL — ABNORMAL HIGH (ref 8–23)
CO2: 21 mmol/L — ABNORMAL LOW (ref 22–32)
Calcium: 8.9 mg/dL (ref 8.9–10.3)
Chloride: 108 mmol/L (ref 98–111)
Creatinine, Ser: 1.35 mg/dL — ABNORMAL HIGH (ref 0.61–1.24)
GFR, Estimated: 54 mL/min — ABNORMAL LOW (ref >60)
Glucose, Bld: 112 mg/dL — ABNORMAL HIGH (ref 70–99)
Potassium: 4.6 mmol/L (ref 3.5–5.1)
Sodium: 138 mmol/L (ref 135–145)
Total Bilirubin: 0.8 mg/dL (ref <1.2)
Total Protein: 7.1 g/dL (ref 6.5–8.1)

## 2023-03-07 LAB — CBC
HCT: 42.1 % (ref 39.0–52.0)
Hemoglobin: 13.8 g/dL (ref 13.0–17.0)
MCH: 31.5 pg (ref 26.0–34.0)
MCHC: 32.8 g/dL (ref 30.0–36.0)
MCV: 96.1 fL (ref 80.0–100.0)
Platelets: 197 10*3/uL (ref 150–400)
RBC: 4.38 MIL/uL (ref 4.22–5.81)
RDW: 13.4 % (ref 11.5–15.5)
WBC: 6.1 10*3/uL (ref 4.0–10.5)
nRBC: 0 % (ref 0.0–0.2)

## 2023-03-07 NOTE — Progress Notes (Signed)
Anesthesia Chart Review   Case: [id number] Date/Time: 03/08/23 1330   Procedures:      CYSTOSCOPY WITH POSSIBLE URETHRAL DILATATION - 30 MINUTES     TRANSURETHRAL RESECTION OF BLADDER TUMOR (TURBT) with GEMCITABINE   Anesthesia type: General   Pre-op diagnosis: BLADDER CANCER   Location: [facility] PROCEDURE ROOM / [facility]   Surgeons: Rene Paci, MD       DISCUSSION:77 y.o. never smoker with h/o PONV, COPD, CKD Stage III, bladder cancer scheduled for above procedure 03/08/2023 with Dr. Cristal Deer Liliane Shi.   Pt last seen by cardiology 08/31/2022. Per OV note, "Preoperative cardiovascular risk assessment. Turbt and gemeitabine instillation by Dr. Liliane Shi on 09/05/2022.   Chart reviewed as part of pre-operative protocol coverage. According to the RCRI, patient has a 0.4% risk of MACE. Patient reports activity equivalent to >4.0 METS (walks >1 mile in 20-25 minutes daily).    Given past medical history and time since last visit, based on ACC/AHA guidelines, Diondre Berganza would be at acceptable risk for the planned procedure without further cardiovascular testing."  VS: BP (!) 142/85 Comment: right arm sitting  Pulse (!) 102   Temp 37.1 C (Oral)   Resp 18   Ht 5\' 8"  (1.727 m)   Wt 88.5 kg   SpO2 95%   BMI 29.65 kg/m   PROVIDERS: Rodrigo Ran, MD is PCP    LABS: Labs reviewed: Acceptable for surgery. (all labs ordered are listed, but only abnormal results are displayed)  Labs Reviewed  COMPREHENSIVE METABOLIC PANEL  CBC     IMAGES:   EKG:   CV:  Past Medical History:  Diagnosis Date   Abdominal pain, lower    Agitation    Allergy, unspecified, initial encounter    Anxiety disorder    Arthritis    Bipolar disorder (HCC)    Patient denies   Cancer [facility])    Bladder   Cancer (HCC)    Skin cancer   Chronic kidney disease, stage III (moderate) (HCC)    Chronic kidney disease, stage III (moderate) (HCC)    COPD (chronic obstructive pulmonary disease) (HCC)     Coronary atherosclerosis of native coronary artery    Esophageal reflux    Fatigue    Fungal granuloma    Hip pain    History of 2019 novel coronavirus disease (COVID-19)    History of endoscopy 02/00/2011   History of kidney stones    Hypertension    Hypo-osmolality and hyponatremia    Hypokalemia    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)    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)    Insomnia due to medical condition    Iron deficiency anemia secondary to blood loss (chronic)    Iron deficiency anemia, unspecified    Lability emotional    Low back pain    Moderate protein-calorie malnutrition (HCC)    Pneumonia    PONV (postoperative nausea and vomiting)    Presence of left artificial hip joint    Restlessness    Substance abuse (HCC)    alcohol   Unspecified fall, subsequent encounter    Vitamin D deficiency, unspecified    Wernicke's encephalopathy 06/23/2019    Past Surgical History:  Procedure Laterality Date   CATARACT EXTRACTION  04/25/2010   COLONOSCOPY W/ ENDOSCOPIC [place]     ESOPHAGOGASTRODUODENOSCOPY     TONSILLECTOMY  04/25/1950    MEDICATIONS:  acetaminophen (TYLENOL) 500 MG tablet   atorvastatin (LIPITOR) 40 MG tablet  betamethasone dipropionate 0.05 % cream   CLOBETASOL PROPIONATE E 0.05 % emollient cream   clonazePAM (KLONOPIN) 0.5 MG tablet   COLCRYS 0.6 MG tablet   DULoxetine (CYMBALTA) 60 MG capsule   dutasteride (AVODART) 0.5 MG capsule   ezetimibe (ZETIA) 10 MG tablet   lansoprazole (PREVACID) 30 MG capsule   levothyroxine (SYNTHROID) 50 MCG tablet   methocarbamol (ROBAXIN) 500 MG tablet   omeprazole-sodium bicarbonate (ZEGERID) 40-1100 MG capsule   ondansetron (ZOFRAN-ODT) 4 MG disintegrating tablet   oxyCODONE-acetaminophen (PERCOCET/ROXICET) 5-325 MG tablet   phenazopyridine (PYRIDIUM) 200 MG tablet   probenecid (BENEMID) 500 MG tablet   Tamsulosin HCl (FLOMAX) 0.4 MG CAPS   valsartan (DIOVAN) 80 MG tablet   No current  facility-administered medications for this encounter.   Jodell Cipro Ward, PA-C [facility] Pre-Surgical Testing [phone]

## 2023-03-08 ENCOUNTER — Encounter (HOSPITAL_COMMUNITY)
Admission: RE | Disposition: A | Discharge status: Home / Self Care | Payer: Self-pay | Source: Home / Self Care | Attending: Urology

## 2023-03-08 ENCOUNTER — Ambulatory Visit (HOSPITAL_COMMUNITY): Payer: Medicare Other | Admitting: Physician Assistant

## 2023-03-08 ENCOUNTER — Ambulatory Visit (HOSPITAL_COMMUNITY)
Admission: RE | Admit: 2023-03-08 | Discharge: 2023-03-08 | Disposition: A | Discharge status: Home / Self Care | Payer: Medicare Other | Attending: Urology | Admitting: Urology

## 2023-03-08 ENCOUNTER — Encounter (HOSPITAL_COMMUNITY): Payer: Self-pay | Admitting: Urology

## 2023-03-08 ENCOUNTER — Ambulatory Visit (HOSPITAL_BASED_OUTPATIENT_CLINIC_OR_DEPARTMENT_OTHER): Payer: Medicare Other

## 2023-03-08 DIAGNOSIS — Z79899 Other long term (current) drug therapy: Secondary | ICD-10-CM | POA: Insufficient documentation

## 2023-03-08 DIAGNOSIS — F419 Anxiety disorder, unspecified: Secondary | ICD-10-CM | POA: Insufficient documentation

## 2023-03-08 DIAGNOSIS — C679 Malignant neoplasm of bladder, unspecified: Secondary | ICD-10-CM

## 2023-03-08 DIAGNOSIS — N183 Chronic kidney disease, stage 3 unspecified: Secondary | ICD-10-CM | POA: Insufficient documentation

## 2023-03-08 DIAGNOSIS — K219 Gastro-esophageal reflux disease without esophagitis: Secondary | ICD-10-CM | POA: Insufficient documentation

## 2023-03-08 DIAGNOSIS — I2510 Atherosclerotic heart disease of native coronary artery without angina pectoris: Secondary | ICD-10-CM

## 2023-03-08 DIAGNOSIS — F319 Bipolar disorder, unspecified: Secondary | ICD-10-CM | POA: Diagnosis not present

## 2023-03-08 DIAGNOSIS — I129 Hypertensive chronic kidney disease with stage 1 through stage 4 chronic kidney disease, or unspecified chronic kidney disease: Secondary | ICD-10-CM | POA: Insufficient documentation

## 2023-03-08 HISTORY — PX: CYSTOSCOPY WITH URETHRAL DILATATION: SHX5125

## 2023-03-08 SURGERY — CYSTOSCOPY, WITH URETHRAL DILATION
Anesthesia: General | Site: Bladder

## 2023-03-08 MED ORDER — CHLORHEXIDINE GLUCONATE 0.12 % MT SOLN
15.0000 mL | Freq: Once | OROMUCOSAL | Status: AC
  Administered 2023-03-08: 15 mL via OROMUCOSAL

## 2023-03-08 MED ORDER — OXYBUTYNIN CHLORIDE 5 MG PO TABS
10.0000 mg | ORAL_TABLET | Freq: Once | ORAL | Status: AC
  Administered 2023-03-08: 10 mg via ORAL

## 2023-03-08 MED ORDER — LIDOCAINE 2% (20 MG/ML) 5 ML SYRINGE
INTRAMUSCULAR | Status: DC | PRN
  Administered 2023-03-08: 80 mg via INTRAVENOUS

## 2023-03-08 MED ORDER — SODIUM CHLORIDE 0.9 % IR SOLN
Status: DC | PRN
  Administered 2023-03-08: 3000 mL via INTRAVESICAL

## 2023-03-08 MED ORDER — GEMCITABINE CHEMO FOR BLADDER INSTILLATION 2000 MG
2000.0000 mg | Freq: Once | INTRAVENOUS | Status: AC
  Administered 2023-03-08: 2000 mg via INTRAVESICAL
  Filled 2023-03-08: qty 2000

## 2023-03-08 MED ORDER — LACTATED RINGERS IV SOLN: INTRAVENOUS | Status: DC

## 2023-03-08 MED ORDER — FENTANYL CITRATE PF 50 MCG/ML IJ SOSY: 25.0000 ug | PREFILLED_SYRINGE | INTRAMUSCULAR | Status: DC | PRN

## 2023-03-08 MED ORDER — PROPOFOL 10 MG/ML IV BOLUS
INTRAVENOUS | Status: AC
  Filled 2023-03-08: qty 20

## 2023-03-08 MED ORDER — ONDANSETRON HCL 4 MG/2ML IJ SOLN
INTRAMUSCULAR | Status: AC
  Filled 2023-03-08: qty 2

## 2023-03-08 MED ORDER — OXYBUTYNIN CHLORIDE 5 MG PO TABS: 5.0000 mg | ORAL_TABLET | Freq: Three times a day (TID) | ORAL | 1 refills | Status: DC | PRN

## 2023-03-08 MED ORDER — ONDANSETRON HCL 4 MG/2ML IJ SOLN
INTRAMUSCULAR | Status: DC | PRN
  Administered 2023-03-08: 4 mg via INTRAVENOUS

## 2023-03-08 MED ORDER — OXYBUTYNIN CHLORIDE 5 MG PO TABS
ORAL_TABLET | ORAL | Status: AC
  Filled 2023-03-08: qty 2

## 2023-03-08 MED ORDER — ORAL CARE MOUTH RINSE: 15.0000 mL | Freq: Once | OROMUCOSAL | Status: AC

## 2023-03-08 MED ORDER — LIDOCAINE HCL (PF) 2 % IJ SOLN
INTRAMUSCULAR | Status: AC
Start: 2023-03-08 — End: ?
  Filled 2023-03-08: qty 5

## 2023-03-08 MED ORDER — FENTANYL CITRATE (PF) 100 MCG/2ML IJ SOLN
INTRAMUSCULAR | Status: DC | PRN
  Administered 2023-03-08 (×2): 50 ug via INTRAVENOUS

## 2023-03-08 MED ORDER — CEFAZOLIN SODIUM-DEXTROSE 2-4 GM/100ML-% IV SOLN
2.0000 g | INTRAVENOUS | Status: AC
Start: 2023-03-08 — End: 2023-03-08
  Administered 2023-03-08: 2 g via INTRAVENOUS
  Filled 2023-03-08: qty 100

## 2023-03-08 MED ORDER — DEXAMETHASONE SODIUM PHOSPHATE 10 MG/ML IJ SOLN
INTRAMUSCULAR | Status: AC
  Filled 2023-03-08: qty 1

## 2023-03-08 MED ORDER — DROPERIDOL 2.5 MG/ML IJ SOLN: 0.6250 mg | Freq: Once | INTRAMUSCULAR | Status: DC | PRN

## 2023-03-08 MED ORDER — FENTANYL CITRATE (PF) 100 MCG/2ML IJ SOLN
INTRAMUSCULAR | Status: AC
  Filled 2023-03-08: qty 2

## 2023-03-08 MED ORDER — DEXAMETHASONE SODIUM PHOSPHATE 10 MG/ML IJ SOLN
INTRAMUSCULAR | Status: DC | PRN
  Administered 2023-03-08: 5 mg via INTRAVENOUS

## 2023-03-08 MED ORDER — ONDANSETRON HCL 4 MG PO TABS: 4.0000 mg | ORAL_TABLET | Freq: Every day | ORAL | 1 refills | Status: DC | PRN

## 2023-03-08 MED ORDER — PROPOFOL 10 MG/ML IV BOLUS
INTRAVENOUS | Status: DC | PRN
  Administered 2023-03-08: 140 mg via INTRAVENOUS

## 2023-03-08 SURGICAL SUPPLY — 21 items
BAG URINE DRAIN 2000ML AR STRL (UROLOGICAL SUPPLIES) ×1 IMPLANT
BALLN NEPHROSTOMY (BALLOONS)
BALLOON NEPHROSTOMY (BALLOONS) IMPLANT
CATH FOLEY 2W COUNCIL 20FR 5CC (CATHETERS) IMPLANT
CATH FOLEY 2WAY SLVR 5CC 18FR (CATHETERS) IMPLANT
CATH ROBINSON RED A/P 14FR (CATHETERS) ×1 IMPLANT
CATH URET 5FR 70CM CONE TIP (BALLOONS) IMPLANT
CATH URETL OPEN 5X70 (CATHETERS) IMPLANT
CLOTH BEACON ORANGE TIMEOUT ST (SAFETY) ×1 IMPLANT
GLOVE BIO SURGEON STRL SZ7.5 (GLOVE) ×1 IMPLANT
GOWN STRL REUS W/ TWL XL LVL3 (GOWN DISPOSABLE) ×1 IMPLANT
GOWN STRL REUS W/TWL XL LVL3 (GOWN DISPOSABLE) ×1
GUIDEWIRE ANG ZIPWIRE 038X150 (WIRE) IMPLANT
GUIDEWIRE STR DUAL SENSOR (WIRE) ×1 IMPLANT
KIT TURNOVER KIT A (KITS) IMPLANT
MANIFOLD NEPTUNE II (INSTRUMENTS) IMPLANT
NS IRRIG 1000ML POUR BTL (IV SOLUTION) IMPLANT
PACK CYSTO (CUSTOM PROCEDURE TRAY) ×1 IMPLANT
PAD PREP 24X48 CUFFED NSTRL (MISCELLANEOUS) ×1 IMPLANT
PENCIL SMOKE EVACUATOR (MISCELLANEOUS) IMPLANT
WATER STERILE IRR 3000ML UROMA (IV SOLUTION) ×1 IMPLANT

## 2023-03-08 NOTE — H&P (Signed)
Office Visit Report     02/28/2023   --------------------------------------------------------------------------------   Miguel Williams  MRN: [id number]  DOB: July 05, 1945, 77 year old Male  PRIMARY CARE:     REFERRING:  Adaiah Morken A. Liliane Shi, MD  PROVIDER:  Rhoderick Moody, M.D.  LOCATION:  [facility] 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   09/12/22: The patient is here today for routine postoperative visit and for a voiding trial. He required a Foley catheter following his TURBT due to scope trauma. He has done well since his surgery and denies interval episodes of gross hematuria, but is complaining of catheter discomfort today.   12/15/22: The patient is here today for a routine follow-up. Over the past several months, the patient has struggled with a weak FOS and worsening LUTS. He was evaluated by a urologist in [place], who placed a Foley catheter.   02/28/23: The patient is here today for a routine follow-up and surveillance cystoscopy. He has done well since his last visit and notes significant improvement in his voiding symptoms since his last cystoscopy and since starting oxybutynin. He reports good force of stream and feels like he is emptying his bladder well with less urgency and frequency. He denies interval UTIs, dysuria or hematuria.     ALLERGIES: Baclofen Neosporin Sulfa Drugs    MEDICATIONS: Oxybutynin Chloride Er 5 mg tablet, extended release 24 hr 1 tablet PO Daily  Tamsulosin Hcl 0.4 mg capsule  Aspirin Ec 81 mg tablet, delayed release 0 Oral  Atorvastatin Calcium 10 mg tablet tablet  Clobetasol Emollient 0.05 % cream  Clonazepam 0.5 mg tablet  Colchicine 0.6 mg tablet  Dexilant 30 mg capsule, delayed release, biphasic   Duloxetine Hcl 60 mg capsule,delayed release  Dutasteride 0.5 mg capsule  Fiber  Levoxyl 50 mcg tablet  Ondansetron Hcl 8 mg tablet  Ondansetron Odt 4 mg tablet,disintegrating 1 tablet PO Q 4 H PRN  Probenecid 500 mg tablet  Triamcinolone Acetonide 0.1 % cream  Valsartan 80 mg tablet  Zetia 10 mg tablet     GU PSH: Cystoscopy - 12/15/2022, 08/29/2022       PSH Notes: Cataract Surgery, Complete Colonoscopy, Tonsillectomy   NON-GU PSH: Cataract surgery, 2013 Diagnostic Colonoscopy - 2010 Remove Tonsils - 2008 Visit Complexity (formerly GPC1X) - 12/15/2022     GU PMH: Anterior urethral stricture - 12/15/2022 Bladder Cancer Posterior - 12/15/2022, - 09/12/2022 Bladder tumor/neoplasm - 08/29/2022 BPH w/o LUTS - 08/18/2022 Microscopic hematuria - 08/18/2022 Ureteral calculus - 2020 Nocturia - 2018 BPH w/LUTS, 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 No stone was seen on imaging but it was felt that he likely had a small distal stone and was treated as such.   2) BPH/LUTS: He was previously followed by Dr. Isabel Caprice for BPH and LUTS.   Current treatment: Dutasteride 0.5 mg, Tamsulosin 0.4 mg  Prior treatment: Vesicare   NON-GU PMH: Asthma, Asthma - 2014 Arthritis Depression GERD Gout Hypercholesterolemia Hypertension    FAMILY HISTORY: multiple myeloma - Father   SOCIAL HISTORY:  Marital Status: Single Preferred Language: English; Ethnicity: Not Hispanic Or Latino; Race: White Current Smoking Status: Patient has never smoked.   Tobacco Use Assessment Completed: Used Tobacco in last 30 days? Light Drinker.  Drinks 1 caffeinated drink per day. 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     Notes: Never A Smoker, Occupation:, Alcohol Use, Death In The Family Mother, Tobacco Use, Marital History - Single, Caffeine Use, Death In The Family Father   REVIEW OF SYSTEMS:    GU Review Male:   Patient denies frequent urination, hard to postpone urination, burning/ pain with urination, get up at night to urinate, leakage of urine, stream starts and stops, trouble starting your stream, have to strain to urinate , erection problems, and penile pain.  Gastrointestinal (Upper):   Patient denies nausea, vomiting, and indigestion/ heartburn.  Gastrointestinal (Lower):   Patient denies diarrhea and constipation.  Constitutional:   Patient denies fever, night sweats, weight loss, and fatigue.  Skin:   Patient denies skin rash/ lesion and itching.  Eyes:   Patient denies blurred vision and double vision.  Ears/ Nose/ Throat:   Patient denies sore throat and sinus problems.  Hematologic/Lymphatic:   Patient denies swollen glands and easy bruising.  Cardiovascular:   Patient denies leg swelling and chest pains.  Respiratory:   Patient denies cough and shortness of breath.  Endocrine:   Patient denies excessive thirst.  Musculoskeletal:   Patient denies back pain and joint pain.  Neurological:   Patient denies headaches and dizziness.  Psychologic:   Patient denies depression and anxiety.   VITAL SIGNS: None   GU PHYSICAL EXAMINATION:    Urethral Meatus: Normal size. No lesion, no wart, no discharge, no polyp. Normal location.  Penis: Circumcised, no warts, no cracks. No dorsal Peyronie's plaques, no left corporal Peyronie's plaques, no right corporal Peyronie's plaques, no scarring, no warts. No balanitis, no meatal stenosis.   MULTI-SYSTEM PHYSICAL EXAMINATION:    Constitutional: Well-nourished. No physical deformities. Normally developed. Good grooming.  Neurologic / Psychiatric: Oriented to time, oriented to place, oriented to person. No depression, no anxiety, no  agitation.     Complexity of Data:  Records Review:   Pathology Reports, Previous Patient Records   PROCEDURES:         Flexible Cystoscopy - 52000  Risks, benefits, and some of the potential complications of the procedure were discussed at length with the patient including infection, bleeding, voiding discomfort, urinary retention, fever, chills, sepsis, and others. All questions were answered. Informed consent was obtained. Antibiotic prophylaxis was given. Sterile technique and intraurethral analgesia were used.  Meatus:  Normal size. Normal location. Normal condition.  Urethra:  Mild bulbous stricture.  External Sphincter:  Normal.  Verumontanum:  Normal.  Prostate:  Non-obstructing. No hyperplasia.  Bladder Neck:  Non-obstructing.  Ureteral Orifices:  Normal location. Normal size. Normal shape. Effluxed clear urine.  Bladder:  A posterior wall tumor. < 1/2 cm tumor. No trabeculation. Normal mucosa. No stones.      The lower urinary tract was carefully examined. The procedure was well-tolerated and without complications. Antibiotic instructions were given. Instructions were given to call the office immediately for bloody urine, difficulty urinating, urinary retention, painful or frequent urination, fever, chills, nausea, vomiting or other illness. The patient stated that he understood these instructions and would comply with them.         Visit Complexity - G2211          Urinalysis w/Scope Dipstick Dipstick Cont'd Micro  Color: Yellow Bilirubin: Neg mg/dL WBC/hpf: 10 - 81/XBJ  Appearance: Slightly Cloudy Ketones: Neg mg/dL RBC/hpf: NS (Not Seen)  Specific Gravity: 1.010 Blood: Neg ery/uL Bacteria: Many (>50/hpf)  pH: <=5.0 Protein: Neg mg/dL Cystals: NS (Not Seen)  Glucose: Neg mg/dL Urobilinogen: 0.2 mg/dL Casts: NS (Not Seen)    Nitrites: Neg Trichomonas: Not Present    Leukocyte Esterase: 3+ leu/uL Mucous: Not Present      Epithelial Cells: NS (Not Seen)      Yeast: NS  (Not Seen)      Sperm: Not Present    ASSESSMENT:      ICD-10 Details  1 GU:   History of bladder cancer - Z85.51 Chronic, Worsening  2   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 Perini, MD   Created for Patient: Clinical Summary         Notes:   Cystoscopy revealed a very small area of papillary tumor recurrence along the left margin of his previous tumor resection site involving the left posterior bladder wall/dome. Plan for cystoscopy with repeat TURBT and gemcitabine instillation in the coming weeks. He also had a very thin bulbar urethral stricture that may require dilation intraoperatively.

## 2023-03-08 NOTE — Anesthesia Procedure Notes (Signed)
Procedure Name: LMA Insertion Date/Time: 03/08/2023 2:11 PM  Performed by: Vanessa [place], CRNAPre-anesthesia Checklist: Emergency Drugs available, Patient identified, Suction available and Patient being monitored Patient Re-evaluated:Patient Re-evaluated prior to induction Oxygen Delivery Method: Circle system utilized Preoxygenation: Pre-oxygenation with 100% oxygen Induction Type: IV induction Ventilation: Mask ventilation without difficulty LMA: LMA inserted LMA Size: 4.0 Number of attempts: 1 Placement Confirmation: positive ETCO2 and breath sounds checked- equal and bilateral Tube secured with: Tape Dental Injury: Teeth and Oropharynx as per pre-operative assessment

## 2023-03-08 NOTE — Transfer of Care (Signed)
Immediate Anesthesia Transfer of Care Note  Patient: Miguel Williams  Procedure(s) Performed: CYSTOSCOPY (Bladder) TRANSURETHRAL RESECTION OF BLADDER TUMOR (TURBT) with GEMCITABINE (Bladder)  Patient Location: PACU  Anesthesia Type:General  Level of Consciousness: awake  Airway & Oxygen Therapy: Patient Spontanous Breathing and Patient connected to face mask oxygen  Post-op Assessment: Report given to RN and Post -op Vital signs reviewed and stable  Post vital signs: Reviewed and stable  Last Vitals:  Vitals Value Taken Time  BP 156/87 03/08/23 1447  Temp    Pulse 89 03/08/23 1448  Resp 11 03/08/23 1448  SpO2 95 % 03/08/23 1448  Vitals shown include unfiled device data.  Last Pain:  Vitals:   03/08/23 1158  TempSrc: Oral  PainSc: 0-No pain         Complications: No notable events documented.

## 2023-03-08 NOTE — Op Note (Signed)
Operative Note  Preoperative diagnosis:  1.   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  Cystoscopy with TURBT small 2.  Intravesical instillation of gemcitabine  Surgeon: Rhoderick Moody, MD  Assistants:  None  Anesthesia:  General  Complications:  None  EBL: Less than 5 mL  Specimens: 1.  Left sidewall bladder tumor  Drains/Catheters: 1.  18 French Foley catheter  Intraoperative findings:   5 mm papillary bladder tumor along the periphery of the previous bladder tumor resection site involving the left sidewall.  no other intravesical or urethral abnormalities were seen  Indication:  Miguel Williams is a 77 y.o. male with a history of low-grade TA urothelial carcinoma the bladder that was initially diagnosed on 09/07/2022.  During her recent surveillance cystoscopy, the patient was found to have a 5 mm papillary bladder tumor recurrence along the periphery of his previous resection site.  He is here today for repeat TURBT.  He has been consented for the above procedures, voices understanding and wishes to proceed.  Description of procedure:  After informed consent was obtained, the patient was brought to the operating room and general LMA anesthesia was administered. The patient was then placed in the dorsolithotomy position and prepped and draped in the usual sterile fashion. A timeout was performed. A 23 French rigid cystoscope was then inserted into the urethral meatus and advanced into the bladder under direct vision. A complete bladder survey revealed a 5 mm papillary bladder tumor involving the previous resection site along the left sidewall.  No other intravesical or urethral abnormalities were seen.  The rigid cystoscope was then exchanged for a 26 French resectoscope with a bipolar loop working element.  The papillary bladder tumor was then  resected down to the detrusor musculature and sent for permanent section.  The area of resection was then extensively fulgurated until hemostasis was achieved.  There was no evidence of bladder perforation following tumor resection.  An 60 French Foley catheter was then inserted with return of clear irrigant.  The patient tolerated the procedure well and was transferred to the postanesthesia in stable condition.  While in the recovery room 2000 mg of gemcitabine in 50 mL of water was instilled in the bladder through the catheter and the catheter was plugged. This will remain indwelling for approximately one hour. It will then be drained from the bladder and the catheter will be removed and the patient discharged home.  Plan: Drain bladder and remove Foley catheter 1 hour after gemcitabine instillation

## 2023-03-08 NOTE — Anesthesia Preprocedure Evaluation (Addendum)
Anesthesia Evaluation  Patient identified by MRN, date of birth, ID band Patient awake    Reviewed: Allergy & Precautions, NPO status , Patient's Chart, lab work & pertinent test results  History of Anesthesia Complications (+) PONV and history of anesthetic complications  Airway Mallampati: I  TM Distance: >3 FB Neck ROM: Full    Dental  (+) Dental Advisory Given   Pulmonary asthma , COPD   Pulmonary exam normal        Cardiovascular hypertension, Pt. on medications + CAD  Normal cardiovascular exam     Neuro/Psych  PSYCHIATRIC DISORDERS Anxiety  Bipolar Disorder   negative neurological ROS     GI/Hepatic ,GERD  Controlled and Medicated,,(+)     substance abuse  cocaine use, Hepatitis -, C  Endo/Other  negative endocrine ROS    Renal/GU CRFRenal diseaseCKD 3 Bladder dysfunction (bladder tumor)      Musculoskeletal  (+) Arthritis ,    Abdominal Normal abdominal exam  (+)   Peds  Hematology  (+) Blood dyscrasia, anemia   Anesthesia Other Findings   Reproductive/Obstetrics negative OB ROS                             Anesthesia Physical Anesthesia Plan  ASA: 3  Anesthesia Plan: General   Post-op Pain Management: Tylenol PO (pre-op)*   Induction: Intravenous  PONV Risk Score and Plan: 2 and Ondansetron, Dexamethasone and Treatment may vary due to age or medical condition  Airway Management Planned: LMA  Additional Equipment: None  Intra-op Plan:   Post-operative Plan: Extubation in OR  Informed Consent: I have reviewed the patients History and Physical, chart, labs and discussed the procedure including the risks, benefits and alternatives for the proposed anesthesia with the patient or authorized representative who has indicated his/her understanding and acceptance.     Dental advisory given  Plan Discussed with: CRNA  Anesthesia Plan Comments: (DISCUSSION:77 y.o.  never smoker with h/o PONV, COPD, CKD Stage III, bladder cancer scheduled for above procedure 03/08/2023 with Dr. Cristal Deer Liliane Shi.    Pt last seen by cardiology 08/31/2022. Per OV note, "Preoperative cardiovascular risk assessment. Turbt and gemeitabine instillation by Dr. Liliane Shi on 09/05/2022.   Chart reviewed as part of pre-operative protocol coverage. According to the RCRI, patient has a 0.4% risk of MACE. Patient reports activity equivalent to >4.0 METS (walks >1 mile in 20-25 minutes daily).    Given past medical history and time since last visit, based on ACC/AHA guidelines, Zayven Tomasino would be at acceptable risk for the planned procedure without further cardiovascular testing." )        Anesthesia Quick Evaluation

## 2023-03-09 ENCOUNTER — Other Ambulatory Visit: Payer: Self-pay | Admitting: Urology

## 2023-03-09 ENCOUNTER — Encounter (HOSPITAL_COMMUNITY): Payer: Self-pay | Admitting: Urology

## 2023-03-09 ENCOUNTER — Ambulatory Visit (HOSPITAL_BASED_OUTPATIENT_CLINIC_OR_DEPARTMENT_OTHER): Payer: Medicare Other | Admitting: Anesthesiology

## 2023-03-09 ENCOUNTER — Other Ambulatory Visit: Payer: Self-pay

## 2023-03-09 ENCOUNTER — Ambulatory Visit (HOSPITAL_BASED_OUTPATIENT_CLINIC_OR_DEPARTMENT_OTHER)
Admission: RE | Admit: 2023-03-09 | Discharge: 2023-03-09 | Disposition: A | Discharge status: Home / Self Care | Payer: Medicare Other | Attending: Urology | Admitting: Urology

## 2023-03-09 ENCOUNTER — Encounter (HOSPITAL_BASED_OUTPATIENT_CLINIC_OR_DEPARTMENT_OTHER)
Admission: RE | Disposition: A | Discharge status: Home / Self Care | Payer: Self-pay | Source: Home / Self Care | Attending: Urology

## 2023-03-09 DIAGNOSIS — Z79899 Other long term (current) drug therapy: Secondary | ICD-10-CM | POA: Diagnosis not present

## 2023-03-09 DIAGNOSIS — F419 Anxiety disorder, unspecified: Secondary | ICD-10-CM | POA: Insufficient documentation

## 2023-03-09 DIAGNOSIS — J4489 Other specified chronic obstructive pulmonary disease: Secondary | ICD-10-CM | POA: Diagnosis not present

## 2023-03-09 DIAGNOSIS — I10 Essential (primary) hypertension: Secondary | ICD-10-CM | POA: Diagnosis not present

## 2023-03-09 DIAGNOSIS — R319 Hematuria, unspecified: Secondary | ICD-10-CM

## 2023-03-09 DIAGNOSIS — I2510 Atherosclerotic heart disease of native coronary artery without angina pectoris: Secondary | ICD-10-CM | POA: Insufficient documentation

## 2023-03-09 DIAGNOSIS — N3289 Other specified disorders of bladder: Secondary | ICD-10-CM

## 2023-03-09 DIAGNOSIS — K219 Gastro-esophageal reflux disease without esophagitis: Secondary | ICD-10-CM | POA: Insufficient documentation

## 2023-03-09 DIAGNOSIS — F319 Bipolar disorder, unspecified: Secondary | ICD-10-CM | POA: Diagnosis not present

## 2023-03-09 DIAGNOSIS — C674 Malignant neoplasm of posterior wall of bladder: Secondary | ICD-10-CM | POA: Diagnosis present

## 2023-03-09 HISTORY — PX: CYSTOSCOPY WITH FULGERATION: SHX6638

## 2023-03-09 LAB — POCT I-STAT, CHEM 8
BUN: 58 mg/dL — ABNORMAL HIGH (ref 8–23)
Calcium, Ion: 1.2 mmol/L (ref 1.15–1.40)
Chloride: 106 mmol/L (ref 98–111)
Creatinine, Ser: 2.3 mg/dL — ABNORMAL HIGH (ref 0.61–1.24)
Glucose, Bld: 117 mg/dL — ABNORMAL HIGH (ref 70–99)
HCT: 36 % — ABNORMAL LOW (ref 39.0–52.0)
Hemoglobin: 12.2 g/dL — ABNORMAL LOW (ref 13.0–17.0)
Potassium: 4.7 mmol/L (ref 3.5–5.1)
Sodium: 138 mmol/L (ref 135–145)
TCO2: 20 mmol/L — ABNORMAL LOW (ref 22–32)

## 2023-03-09 LAB — SURGICAL PATHOLOGY

## 2023-03-09 SURGERY — CYSTOSCOPY, WITH BLADDER FULGURATION
Anesthesia: General | Site: Renal

## 2023-03-09 MED ORDER — FENTANYL CITRATE (PF) 100 MCG/2ML IJ SOLN
25.0000 ug | INTRAMUSCULAR | Status: DC | PRN
  Administered 2023-03-09 (×2): 25 ug via INTRAVENOUS

## 2023-03-09 MED ORDER — LIDOCAINE HCL (CARDIAC) PF 100 MG/5ML IV SOSY
PREFILLED_SYRINGE | INTRAVENOUS | Status: DC | PRN
  Administered 2023-03-09: 40 mg via INTRAVENOUS

## 2023-03-09 MED ORDER — DEXAMETHASONE SODIUM PHOSPHATE 10 MG/ML IJ SOLN
INTRAMUSCULAR | Status: AC
  Filled 2023-03-09: qty 1

## 2023-03-09 MED ORDER — PROPOFOL 10 MG/ML IV BOLUS
INTRAVENOUS | Status: AC
  Filled 2023-03-09: qty 20

## 2023-03-09 MED ORDER — SODIUM CHLORIDE 0.9 % IV SOLN: INTRAVENOUS | Status: DC

## 2023-03-09 MED ORDER — CEPHALEXIN 500 MG PO CAPS: 500.0000 mg | ORAL_CAPSULE | Freq: Two times a day (BID) | ORAL | 0 refills | Status: AC

## 2023-03-09 MED ORDER — ACETAMINOPHEN 10 MG/ML IV SOLN
1000.0000 mg | Freq: Once | INTRAVENOUS | Status: DC | PRN
Start: 2023-03-09 — End: 2023-03-09

## 2023-03-09 MED ORDER — CEFAZOLIN SODIUM-DEXTROSE 2-4 GM/100ML-% IV SOLN
INTRAVENOUS | Status: AC
  Filled 2023-03-09: qty 100

## 2023-03-09 MED ORDER — SODIUM CHLORIDE 0.9 % IR SOLN
Status: DC | PRN
  Administered 2023-03-09 (×2): 3000 mL

## 2023-03-09 MED ORDER — DEXAMETHASONE SODIUM PHOSPHATE 4 MG/ML IJ SOLN
INTRAMUSCULAR | Status: DC | PRN
  Administered 2023-03-09: 4 mg via INTRAVENOUS

## 2023-03-09 MED ORDER — OXYCODONE HCL 5 MG PO TABS
5.0000 mg | ORAL_TABLET | Freq: Once | ORAL | Status: AC | PRN
  Administered 2023-03-09: 5 mg via ORAL

## 2023-03-09 MED ORDER — OXYCODONE HCL 5 MG PO TABS
ORAL_TABLET | ORAL | Status: AC
  Filled 2023-03-09: qty 1

## 2023-03-09 MED ORDER — FENTANYL CITRATE (PF) 100 MCG/2ML IJ SOLN
INTRAMUSCULAR | Status: AC
  Filled 2023-03-09: qty 2

## 2023-03-09 MED ORDER — 0.9 % SODIUM CHLORIDE (POUR BTL) OPTIME
TOPICAL | Status: DC | PRN
  Administered 2023-03-09: 1000 mL

## 2023-03-09 MED ORDER — ACETAMINOPHEN 325 MG PO TABS: 325.0000 mg | ORAL_TABLET | ORAL | Status: DC | PRN

## 2023-03-09 MED ORDER — LACTATED RINGERS IV SOLN
INTRAVENOUS | Status: DC
Start: 2023-03-09 — End: 2023-03-09

## 2023-03-09 MED ORDER — ONDANSETRON HCL 4 MG/2ML IJ SOLN
INTRAMUSCULAR | Status: AC
  Filled 2023-03-09: qty 2

## 2023-03-09 MED ORDER — ONDANSETRON HCL 4 MG/2ML IJ SOLN
INTRAMUSCULAR | Status: DC | PRN
  Administered 2023-03-09: 4 mg via INTRAVENOUS

## 2023-03-09 MED ORDER — PROPOFOL 10 MG/ML IV BOLUS
INTRAVENOUS | Status: DC | PRN
  Administered 2023-03-09: 110 mg via INTRAVENOUS
  Administered 2023-03-09: 25 mg via INTRAVENOUS

## 2023-03-09 MED ORDER — DROPERIDOL 2.5 MG/ML IJ SOLN: 0.6250 mg | Freq: Once | INTRAMUSCULAR | Status: DC | PRN

## 2023-03-09 MED ORDER — ACETAMINOPHEN 160 MG/5ML PO SOLN: 325.0000 mg | ORAL | Status: DC | PRN

## 2023-03-09 MED ORDER — OXYCODONE HCL 5 MG/5ML PO SOLN
5.0000 mg | Freq: Once | ORAL | Status: AC | PRN
Start: 2023-03-09 — End: 2023-03-09

## 2023-03-09 MED ORDER — FENTANYL CITRATE (PF) 100 MCG/2ML IJ SOLN
INTRAMUSCULAR | Status: DC | PRN
  Administered 2023-03-09 (×2): 25 ug via INTRAVENOUS

## 2023-03-09 MED ORDER — LIDOCAINE HCL (PF) 2 % IJ SOLN
INTRAMUSCULAR | Status: AC
  Filled 2023-03-09: qty 5

## 2023-03-09 MED ORDER — CEFAZOLIN SODIUM-DEXTROSE 2-4 GM/100ML-% IV SOLN
2.0000 g | INTRAVENOUS | Status: AC
Start: 2023-03-09 — End: 2023-03-09
  Administered 2023-03-09: 2 g via INTRAVENOUS

## 2023-03-09 SURGICAL SUPPLY — 23 items
BAG DRAIN URO-CYSTO SKYTR STRL (DRAIN) ×1 IMPLANT
BAG URINE DRAIN 2000ML AR STRL (UROLOGICAL SUPPLIES) IMPLANT
CATH FOLEY 3WAY 30CC 22FR (CATHETERS) IMPLANT
CATH ROBINSON RED A/P 14FR (CATHETERS) IMPLANT
CLOTH BEACON ORANGE TIMEOUT ST (SAFETY) ×1 IMPLANT
ELECT REM PT RETURN 9FT ADLT (ELECTROSURGICAL) ×1
ELECTRODE REM PT RTRN 9FT ADLT (ELECTROSURGICAL) ×1 IMPLANT
GLOVE BIO SURGEON STRL SZ7.5 (GLOVE) ×1 IMPLANT
GOWN STRL REUS W/TWL LRG LVL3 (GOWN DISPOSABLE) ×1 IMPLANT
HOLDER FOLEY CATH W/STRAP (MISCELLANEOUS) IMPLANT
IV NS IRRIG 3000ML ARTHROMATIC (IV SOLUTION) IMPLANT
KIT TURNOVER CYSTO (KITS) ×1 IMPLANT
LOOP CUT BIPOLAR 24F LRG (ELECTROSURGICAL) IMPLANT
MANIFOLD NEPTUNE II (INSTRUMENTS) ×1 IMPLANT
NDL SAFETY ECLIPSE 18X1.5 (NEEDLE) IMPLANT
NS IRRIG 1000ML POUR BTL (IV SOLUTION) IMPLANT
PACK CYSTO (CUSTOM PROCEDURE TRAY) ×1 IMPLANT
SLEEVE SCD COMPRESS KNEE MED (STOCKING) ×1 IMPLANT
SYR 20ML LL LF (SYRINGE) IMPLANT
SYR TOOMEY IRRIG 70ML (MISCELLANEOUS) ×1
SYRINGE TOOMEY IRRIG 70ML (MISCELLANEOUS) IMPLANT
TUBE CONNECTING 12X1/4 (SUCTIONS) ×1 IMPLANT
WATER STERILE IRR 3000ML UROMA (IV SOLUTION) ×1 IMPLANT

## 2023-03-09 NOTE — Transfer of Care (Signed)
Immediate Anesthesia Transfer of Care Note  Patient: Miguel Williams  Procedure(s) Performed: CYSTOSCOPY WITH FULGERATION WITH CLOT EVACUATION (Renal)  Patient Location: PACU  Anesthesia Type:General  Level of Consciousness: awake, alert , and oriented  Airway & Oxygen Therapy: Patient Spontanous Breathing and Patient connected to nasal cannula oxygen  Post-op Assessment: Report given to RN  Post vital signs: Reviewed and stable  Last Vitals:  Vitals Value Taken Time  BP 136/76 03/09/23 1341  Temp    Pulse 91 03/09/23 1344  Resp 12 03/09/23 1344  SpO2 99 % 03/09/23 1344  Vitals shown include unfiled device data.  Last Pain:  Vitals:   03/09/23 1211  TempSrc: Oral  PainSc: 0-No pain         Complications: No notable events documented.

## 2023-03-09 NOTE — Op Note (Signed)
Operative Note  Preoperative diagnosis:  1.  Clot urinary retention 2.  History of bladder cancer   Postoperative diagnosis: 1.  Clot urinary retention 2.  History of bladder cancer  Procedure(s): 1.  Clot urinary retention 2.  History of bladder cancer  Surgeon: Rhoderick Moody, MD  Assistants:  None  Anesthesia:  General  Complications:  None  EBL:  500 mL of clot was evacuated from the bladder   Specimens: 1. none  Drains/Catheters: 1.  37F 3-way with 10 mL of sterile water in the balloon  Intraoperative findings:   Single arterial bleeder at the bladder tumor resection site with significant intravesical clot burden.  No other intravesical abnormalities or areas of bleeding were identified.  Indication:  Miguel Williams is a 77 y.o. male with a history of urothelial carcinoma the bladder, status post TURBT on 03/08/2023.  The patient presented back to the office with significant clot retention from hematuria overnight.  He has been consented for the above procedures, voices understanding and wishes to proceed.  Description of procedure:  After informed consent was obtained, the patient was brought to the operating room and general LMA anesthesia was administered.  The patient was placed in the dorsolithotomy position and prepped and draped in usual sterile fashion.  A timeout was then performed.  A 26 French resectoscope with a bipolar loop working element was then advanced into the bladder.  A complete bladder survey revealed significant clot burden as well as a single arterial bleeder at his resection site involving the left sidewall of the bladder.  All clot was then evacuated from the lumen of the bladder through the sheath of the resectoscope.  The arterial bleeder was then extensively fulgurated until hemostasis was achieved.  There were no other areas of bleeding throughout the bladder, prostate or urethra.  The resectoscope was then removed and a 22 [place] three-way  Foley catheter was inserted with return of clear irrigant.  The patient's bladder was extensively hand irrigated with return of clear irrigant.  His Foley catheter was then started on CBI.  He tolerated procedure well and was transferred to the postanesthesia in stable condition.  Plan:  Monitor in PACU.  Likely home with Foley catheter

## 2023-03-09 NOTE — H&P (Signed)
Office Visit Report     03/09/2023   --------------------------------------------------------------------------------   Miguel Williams  MRN: [id number]  DOB: 02-19-1946, 77 year old Male  PRIMARY CARE:  Mark A. Perini, MD  PRIMARY CARE FAX:  [phone]  REFERRING:  Cristal Deer A. Liliane Shi, MD  PROVIDER:  Rhoderick Moody, M.D.  LOCATION:  [facility] [phone]     --------------------------------------------------------------------------------   CC/HPI: Bladder cancer   HPI: Miguel Williams is a 77 year old male a history of Ta, low grade UCC of the bladder, s/p TURBT with gemcitabine instillation on 09/07/22.   09/12/22: The patient is here today for routine postoperative visit and for a voiding trial. He required a Foley catheter following his TURBT due to scope trauma. He has done well since his surgery and denies interval episodes of gross hematuria, but is complaining of catheter discomfort today.   12/15/22: The patient is here today for a routine follow-up. Over the past several months, the patient has struggled with a weak FOS and worsening LUTS. He was evaluated by a urologist in [place], who placed a Foley catheter.   02/28/23: The patient is here today for a routine follow-up and surveillance cystoscopy. He has done well since his last visit and notes significant improvement in his voiding symptoms since his last cystoscopy and since starting oxybutynin. He reports good force of stream and feels like he is emptying his bladder well with less urgency and frequency. He denies interval UTIs, dysuria or hematuria.   03/09/2023: The patient presents back today with persistent gross hematuria following his TURBT with gemcitabine instillation yesterday. Despite extensive hand irrigation, his hematuria and clot burden persist. He denies any significant lower abdominal discomfort nausea/vomiting or fever/chills.     ALLERGIES: Baclofen Neosporin Sulfa Drugs     MEDICATIONS: Oxybutynin Chloride Er 5 mg tablet, extended release 24 hr 1 tablet PO Daily  Tamsulosin Hcl 0.4 mg capsule  Aspirin Ec 81 mg tablet, delayed release 0 Oral  Atorvastatin Calcium 10 mg tablet tablet  Clobetasol Emollient 0.05 % cream  Clonazepam 0.5 mg tablet  Colchicine 0.6 mg tablet  Dexilant 30 mg capsule, delayed release, biphasic  Duloxetine Hcl 60 mg capsule,delayed release  Dutasteride 0.5 mg capsule  Fiber  Levoxyl 50 mcg tablet  Ondansetron Hcl 8 mg tablet  Ondansetron Odt 4 mg tablet,disintegrating 1 tablet PO Q 4 H PRN  Probenecid 500 mg tablet  Triamcinolone Acetonide 0.1 % cream  Valsartan 80 mg tablet  Zetia 10 mg tablet     GU PSH: Cystoscopy - 02/28/2023, 12/15/2022, 08/29/2022       PSH Notes: Cataract Surgery, Complete Colonoscopy, Tonsillectomy   NON-GU PSH: Cataract surgery, 2013 Diagnostic Colonoscopy - 2010 Remove Tonsils - 2008 Visit Complexity (formerly GPC1X) - 02/28/2023, 12/15/2022     GU PMH: Bladder Cancer Posterior - 02/28/2023, - 12/15/2022, - 09/12/2022 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 No stone was seen on imaging but it was felt that he likely had a small  distal stone and was treated as such.   2) BPH/LUTS: He was previously followed by Dr. Isabel Caprice for BPH and LUTS.   Current treatment: Dutasteride 0.5 mg, Tamsulosin 0.4 mg  Prior treatment: Vesicare   NON-GU PMH:  Asthma, Asthma - 2014 Arthritis Depression GERD Gout Hypercholesterolemia Hypertension    FAMILY HISTORY: multiple myeloma - Father   SOCIAL HISTORY: Marital Status: Single Preferred Language: English; Ethnicity: Not Hispanic Or Latino; Race: White Current Smoking Status: Patient has never smoked.   Tobacco Use Assessment Completed: Used Tobacco in last 30 days? Light Drinker.  Drinks 1 caffeinated drink per day. 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     Notes: Never A Smoker, Occupation:, Alcohol Use, Death In The Family Mother, Tobacco Use, Marital History - Single, Caffeine Use, Death In The Family Father   REVIEW OF SYSTEMS:    GU Review Male:   Patient denies frequent urination, hard to postpone urination, burning/ pain with urination, get up at night to urinate, leakage of urine, stream starts and stops, trouble starting your stream, have to strain to urinate , erection problems, and penile pain.  Gastrointestinal (Upper):   Patient denies nausea, vomiting, and indigestion/ heartburn.  Gastrointestinal (Lower):   Patient denies diarrhea and constipation.  Constitutional:   Patient denies fever, night sweats, weight loss, and fatigue.  Skin:   Patient denies skin rash/ lesion and itching.  Eyes:   Patient denies blurred vision and double vision.  Ears/ Nose/ Throat:   Patient denies sore throat and sinus problems.  Hematologic/Lymphatic:   Patient denies swollen glands and easy bruising.  Cardiovascular:   Patient denies leg swelling and chest pains.  Respiratory:   Patient denies cough and shortness of breath.  Endocrine:   Patient denies excessive thirst.  Musculoskeletal:   Patient denies back pain and joint pain.  Neurological:   Patient denies dizziness and headaches.  Psychologic:   Patient denies depression and anxiety.   VITAL SIGNS: None   GU PHYSICAL EXAMINATION:    Penis: Penile foley catheter present and draining grossly bloody urine stents  of hand irrigation. Circumcised, no foreskin warts, no cracks. No dorsal peyronie's plaques, no left corporal peyronie's plaques, no right corporal peyronie's plaques, no scarring, no shaft warts. No balanitis, no meatal stenosis.    MULTI-SYSTEM PHYSICAL EXAMINATION:    Constitutional: Well-nourished. No physical deformities. Normally developed. Good grooming.  Neurologic / Psychiatric: Oriented to time, oriented to place, oriented to person. No depression, no anxiety, no agitation.     PAST DATA REVIEW: None   PROCEDURES:         Simple Foley Catheterization - 16109  A 22 French Foley catheter was inserted into the bladder using sterile technique. The patient was taught routine catheter care. Hand irrigation of the bladder with sterile saline was performed.   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

## 2023-03-09 NOTE — Anesthesia Preprocedure Evaluation (Addendum)
Anesthesia Evaluation  Patient identified by MRN, date of birth, ID band Patient awake    Reviewed: Allergy & Precautions, NPO status , Patient's Chart, lab work & pertinent test results  History of Anesthesia Complications (+) PONV and history of anesthetic complications  Airway Mallampati: II  TM Distance: >3 FB Neck ROM: Full    Dental  (+) Teeth Intact, Dental Advisory Given   Pulmonary asthma , COPD   breath sounds clear to auscultation       Cardiovascular hypertension, Pt. on medications + CAD   Rhythm:Regular Rate:Normal     Neuro/Psych  PSYCHIATRIC DISORDERS Anxiety  Bipolar Disorder      GI/Hepatic Neg liver ROS,GERD  Medicated,,  Endo/Other  negative endocrine ROS    Renal/GU Renal disease     Musculoskeletal  (+) Arthritis ,    Abdominal   Peds  Hematology  (+) Blood dyscrasia, anemia   Anesthesia Other Findings   Reproductive/Obstetrics                             Anesthesia Physical Anesthesia Plan  ASA: 3  Anesthesia Plan: General   Post-op Pain Management: Tylenol PO (pre-op)*   Induction: Intravenous  PONV Risk Score and Plan: 4 or greater and Ondansetron, Dexamethasone and Treatment may vary due to age or medical condition  Airway Management Planned: LMA  Additional Equipment: None  Intra-op Plan:   Post-operative Plan: Extubation in OR  Informed Consent: I have reviewed the patients History and Physical, chart, labs and discussed the procedure including the risks, benefits and alternatives for the proposed anesthesia with the patient or authorized representative who has indicated his/her understanding and acceptance.     Dental advisory given  Plan Discussed with: CRNA  Anesthesia Plan Comments:        Anesthesia Quick Evaluation

## 2023-03-09 NOTE — Anesthesia Postprocedure Evaluation (Signed)
Anesthesia Post Note  Patient: Sports administrator  Procedure(s) Performed: CYSTOSCOPY WITH FULGERATION WITH CLOT EVACUATION (Renal)     Patient location during evaluation: PACU Anesthesia Type: General Level of consciousness: awake and alert Pain management: pain level controlled Vital Signs Assessment: post-procedure vital signs reviewed and stable Respiratory status: spontaneous breathing, nonlabored ventilation, respiratory function stable and patient connected to nasal cannula oxygen Cardiovascular status: blood pressure returned to baseline and stable Postop Assessment: no apparent nausea or vomiting Anesthetic complications: no  No notable events documented.  Last Vitals:  Vitals:   03/09/23 1445 03/09/23 1530  BP: 139/73 128/72  Pulse: 92 97  Resp: 19 16  Temp: 36.9 C 36.9 C  SpO2: 92% 97%    Last Pain:  Vitals:   03/09/23 1530  TempSrc:   PainSc: 4                  Shelton Silvas

## 2023-03-09 NOTE — Anesthesia Procedure Notes (Signed)
Procedure Name: LMA Insertion Date/Time: 03/09/2023 12:59 PM  Performed by: Briant Sites, CRNAPre-anesthesia Checklist: Patient identified, Emergency Drugs available, Suction available and Patient being monitored Patient Re-evaluated:Patient Re-evaluated prior to induction Oxygen Delivery Method: Circle system utilized Preoxygenation: Pre-oxygenation with 100% oxygen Induction Type: IV induction Ventilation: Mask ventilation without difficulty LMA: LMA inserted LMA Size: 4.0 Number of attempts: 1 Airway Equipment and Method: Bite block Placement Confirmation: positive ETCO2 Tube secured with: Tape Dental Injury: Teeth and Oropharynx as per pre-operative assessment

## 2023-03-09 NOTE — Discharge Instructions (Signed)

## 2023-03-10 NOTE — Anesthesia Postprocedure Evaluation (Signed)
Anesthesia Post Note  Patient: Sports administrator  Procedure(s) Performed: CYSTOSCOPY (Bladder) TRANSURETHRAL RESECTION OF BLADDER TUMOR (TURBT) with GEMCITABINE (Bladder)     Patient location during evaluation: PACU Anesthesia Type: General Level of consciousness: awake and alert Pain management: pain level controlled Vital Signs Assessment: post-procedure vital signs reviewed and stable Respiratory status: spontaneous breathing, nonlabored ventilation, respiratory function stable and patient connected to nasal cannula oxygen Cardiovascular status: blood pressure returned to baseline and stable Postop Assessment: no apparent nausea or vomiting Anesthetic complications: no   No notable events documented.  Last Vitals:  Vitals:   03/08/23 1615 03/08/23 1645  BP: (!) 156/92 (!) 158/88  Pulse: 88 90  Resp: 15 16  Temp:  (!) 36.4 C  SpO2: 96% 94%    Last Pain:  Vitals:   03/08/23 1645  TempSrc:   PainSc: 0-No pain   Pain Goal:                   Miguel Williams

## 2023-03-13 ENCOUNTER — Encounter (HOSPITAL_BASED_OUTPATIENT_CLINIC_OR_DEPARTMENT_OTHER): Payer: Self-pay | Admitting: Urology

## 2023-04-26 HISTORY — PX: COLONOSCOPY: SHX174

## 2023-08-09 ENCOUNTER — Inpatient Hospital Stay (HOSPITAL_COMMUNITY)
Admission: EM | Admit: 2023-08-09 | Discharge: 2023-08-11 | DRG: 605 | Disposition: A | Discharge status: Home / Self Care | Attending: Internal Medicine | Admitting: Internal Medicine

## 2023-08-09 ENCOUNTER — Encounter (HOSPITAL_COMMUNITY): Payer: Self-pay

## 2023-08-09 ENCOUNTER — Other Ambulatory Visit: Payer: Self-pay

## 2023-08-09 DIAGNOSIS — I2510 Atherosclerotic heart disease of native coronary artery without angina pectoris: Secondary | ICD-10-CM | POA: Diagnosis present

## 2023-08-09 DIAGNOSIS — S7012XA Contusion of left thigh, initial encounter: Principal | ICD-10-CM | POA: Diagnosis present

## 2023-08-09 DIAGNOSIS — Z85828 Personal history of other malignant neoplasm of skin: Secondary | ICD-10-CM

## 2023-08-09 DIAGNOSIS — Z87442 Personal history of urinary calculi: Secondary | ICD-10-CM

## 2023-08-09 DIAGNOSIS — Z96649 Presence of unspecified artificial hip joint: Secondary | ICD-10-CM | POA: Diagnosis present

## 2023-08-09 DIAGNOSIS — E785 Hyperlipidemia, unspecified: Secondary | ICD-10-CM | POA: Diagnosis present

## 2023-08-09 DIAGNOSIS — J449 Chronic obstructive pulmonary disease, unspecified: Secondary | ICD-10-CM | POA: Diagnosis present

## 2023-08-09 DIAGNOSIS — Z881 Allergy status to other antibiotic agents status: Secondary | ICD-10-CM

## 2023-08-09 DIAGNOSIS — I129 Hypertensive chronic kidney disease with stage 1 through stage 4 chronic kidney disease, or unspecified chronic kidney disease: Secondary | ICD-10-CM | POA: Diagnosis present

## 2023-08-09 DIAGNOSIS — Z888 Allergy status to other drugs, medicaments and biological substances status: Secondary | ICD-10-CM

## 2023-08-09 DIAGNOSIS — N1831 Chronic kidney disease, stage 3a: Secondary | ICD-10-CM | POA: Diagnosis present

## 2023-08-09 DIAGNOSIS — D649 Anemia, unspecified: Secondary | ICD-10-CM | POA: Diagnosis not present

## 2023-08-09 DIAGNOSIS — S8012XA Contusion of left lower leg, initial encounter: Secondary | ICD-10-CM | POA: Diagnosis present

## 2023-08-09 DIAGNOSIS — Z801 Family history of malignant neoplasm of trachea, bronchus and lung: Secondary | ICD-10-CM

## 2023-08-09 DIAGNOSIS — G8929 Other chronic pain: Secondary | ICD-10-CM | POA: Diagnosis present

## 2023-08-09 DIAGNOSIS — W19XXXA Unspecified fall, initial encounter: Secondary | ICD-10-CM | POA: Diagnosis present

## 2023-08-09 DIAGNOSIS — E512 Wernicke's encephalopathy: Secondary | ICD-10-CM | POA: Diagnosis present

## 2023-08-09 DIAGNOSIS — Z8616 Personal history of COVID-19: Secondary | ICD-10-CM

## 2023-08-09 DIAGNOSIS — I4510 Unspecified right bundle-branch block: Secondary | ICD-10-CM | POA: Diagnosis present

## 2023-08-09 DIAGNOSIS — R710 Precipitous drop in hematocrit: Principal | ICD-10-CM | POA: Diagnosis present

## 2023-08-09 DIAGNOSIS — Z882 Allergy status to sulfonamides status: Secondary | ICD-10-CM

## 2023-08-09 DIAGNOSIS — Z79890 Hormone replacement therapy (postmenopausal): Secondary | ICD-10-CM

## 2023-08-09 DIAGNOSIS — N401 Enlarged prostate with lower urinary tract symptoms: Secondary | ICD-10-CM | POA: Diagnosis present

## 2023-08-09 DIAGNOSIS — F319 Bipolar disorder, unspecified: Secondary | ICD-10-CM | POA: Diagnosis present

## 2023-08-09 DIAGNOSIS — Z8551 Personal history of malignant neoplasm of bladder: Secondary | ICD-10-CM

## 2023-08-09 DIAGNOSIS — E039 Hypothyroidism, unspecified: Secondary | ICD-10-CM | POA: Diagnosis present

## 2023-08-09 LAB — COMPREHENSIVE METABOLIC PANEL WITH GFR
ALT: 24 U/L (ref 0–44)
AST: 25 U/L (ref 15–41)
Albumin: 3.4 g/dL — ABNORMAL LOW (ref 3.5–5.0)
Alkaline Phosphatase: 66 U/L (ref 38–126)
Anion gap: 5 (ref 5–15)
BUN: 29 mg/dL — ABNORMAL HIGH (ref 8–23)
CO2: 28 mmol/L (ref 22–32)
Calcium: 8.7 mg/dL — ABNORMAL LOW (ref 8.9–10.3)
Chloride: 105 mmol/L (ref 98–111)
Creatinine, Ser: 1.55 mg/dL — ABNORMAL HIGH (ref 0.61–1.24)
GFR, Estimated: 46 mL/min — ABNORMAL LOW (ref >60)
Glucose, Bld: 127 mg/dL — ABNORMAL HIGH (ref 70–99)
Potassium: 4.4 mmol/L (ref 3.5–5.1)
Sodium: 138 mmol/L (ref 135–145)
Total Bilirubin: 1.1 mg/dL (ref 0.0–1.2)
Total Protein: 6.6 g/dL (ref 6.5–8.1)

## 2023-08-09 LAB — URINALYSIS, ROUTINE W REFLEX MICROSCOPIC
Bilirubin Urine: NEGATIVE
Glucose, UA: NEGATIVE mg/dL
Ketones, ur: NEGATIVE mg/dL
Nitrite: NEGATIVE
Protein, ur: NEGATIVE mg/dL
Specific Gravity, Urine: 1.016 (ref 1.005–1.030)
pH: 5 (ref 5.0–8.0)

## 2023-08-09 LAB — CBC WITH DIFFERENTIAL/PLATELET
Abs Immature Granulocytes: 0.55 10*3/uL — ABNORMAL HIGH (ref 0.00–0.07)
Basophils Absolute: 0.1 10*3/uL (ref 0.0–0.1)
Basophils Relative: 1 %
Eosinophils Absolute: 0.5 10*3/uL (ref 0.0–0.5)
Eosinophils Relative: 5 %
HCT: 23.7 % — ABNORMAL LOW (ref 39.0–52.0)
Hemoglobin: 7.5 g/dL — ABNORMAL LOW (ref 13.0–17.0)
Immature Granulocytes: 5 %
Lymphocytes Relative: 8 %
Lymphs Abs: 0.8 10*3/uL (ref 0.7–4.0)
MCH: 32.2 pg (ref 26.0–34.0)
MCHC: 31.6 g/dL (ref 30.0–36.0)
MCV: 101.7 fL — ABNORMAL HIGH (ref 80.0–100.0)
Monocytes Absolute: 0.9 10*3/uL (ref 0.1–1.0)
Monocytes Relative: 9 %
Neutro Abs: 7.5 10*3/uL (ref 1.7–7.7)
Neutrophils Relative %: 72 %
Platelets: 362 10*3/uL (ref 150–400)
RBC: 2.33 MIL/uL — ABNORMAL LOW (ref 4.22–5.81)
RDW: 15.3 % (ref 11.5–15.5)
WBC: 10.3 10*3/uL (ref 4.0–10.5)
nRBC: 1.3 % — ABNORMAL HIGH (ref 0.0–0.2)

## 2023-08-09 LAB — PREPARE RBC (CROSSMATCH)

## 2023-08-09 LAB — ABO/RH: ABO/RH(D): A POS

## 2023-08-09 MED ORDER — AMLODIPINE BESYLATE 10 MG PO TABS
10.0000 mg | ORAL_TABLET | Freq: Every day | ORAL | Status: DC
  Administered 2023-08-10 (×2): 10 mg via ORAL
  Filled 2023-08-09: qty 2
  Filled 2023-08-09: qty 1

## 2023-08-09 MED ORDER — ATORVASTATIN CALCIUM 40 MG PO TABS
40.0000 mg | ORAL_TABLET | Freq: Every day | ORAL | Status: DC
  Administered 2023-08-10 – 2023-08-11 (×2): 40 mg via ORAL
  Filled 2023-08-09 (×2): qty 1

## 2023-08-09 MED ORDER — ACETAMINOPHEN 325 MG PO TABS: 650.0000 mg | ORAL_TABLET | Freq: Four times a day (QID) | ORAL | Status: DC | PRN

## 2023-08-09 MED ORDER — TAMSULOSIN HCL 0.4 MG PO CAPS
0.4000 mg | ORAL_CAPSULE | Freq: Every day | ORAL | Status: DC
  Administered 2023-08-10 – 2023-08-11 (×2): 0.4 mg via ORAL
  Filled 2023-08-09 (×2): qty 1

## 2023-08-09 MED ORDER — ONDANSETRON HCL 4 MG/2ML IJ SOLN: 4.0000 mg | Freq: Four times a day (QID) | INTRAMUSCULAR | Status: DC | PRN

## 2023-08-09 MED ORDER — PROBENECID 500 MG PO TABS
500.0000 mg | ORAL_TABLET | Freq: Every day | ORAL | Status: DC
  Administered 2023-08-10 – 2023-08-11 (×2): 500 mg via ORAL
  Filled 2023-08-09 (×2): qty 1

## 2023-08-09 MED ORDER — ONDANSETRON HCL 4 MG PO TABS
4.0000 mg | ORAL_TABLET | Freq: Four times a day (QID) | ORAL | Status: DC | PRN
Start: 2023-08-09 — End: 2023-08-11

## 2023-08-09 MED ORDER — LEVOTHYROXINE SODIUM 50 MCG PO TABS
50.0000 ug | ORAL_TABLET | Freq: Every day | ORAL | Status: DC
  Administered 2023-08-10 – 2023-08-11 (×2): 50 ug via ORAL
  Filled 2023-08-09 (×2): qty 1

## 2023-08-09 MED ORDER — OXYBUTYNIN CHLORIDE ER 5 MG PO TB24: 5.0000 mg | ORAL_TABLET | Freq: Every evening | ORAL | Status: DC | PRN

## 2023-08-09 MED ORDER — COLCHICINE 0.6 MG PO TABS
0.6000 mg | ORAL_TABLET | Freq: Every day | ORAL | Status: DC
  Administered 2023-08-10 (×2): 0.6 mg via ORAL
  Filled 2023-08-09 (×4): qty 1

## 2023-08-09 MED ORDER — DULOXETINE HCL 30 MG PO CPEP
60.0000 mg | ORAL_CAPSULE | Freq: Every day | ORAL | Status: DC
  Administered 2023-08-10 (×2): 60 mg via ORAL
  Filled 2023-08-09 (×2): qty 2

## 2023-08-09 MED ORDER — HYDROCODONE-ACETAMINOPHEN 5-325 MG PO TABS: 1.0000 | ORAL_TABLET | ORAL | Status: DC | PRN

## 2023-08-09 MED ORDER — ACETAMINOPHEN 650 MG RE SUPP: 650.0000 mg | Freq: Four times a day (QID) | RECTAL | Status: DC | PRN

## 2023-08-09 MED ORDER — IRBESARTAN 75 MG PO TABS
37.5000 mg | ORAL_TABLET | Freq: Every day | ORAL | Status: DC
  Administered 2023-08-10 – 2023-08-11 (×2): 37.5 mg via ORAL
  Filled 2023-08-09 (×2): qty 1
  Filled 2023-08-09: qty 0.5

## 2023-08-09 MED ORDER — EZETIMIBE 10 MG PO TABS
10.0000 mg | ORAL_TABLET | Freq: Every day | ORAL | Status: DC
  Administered 2023-08-10 – 2023-08-11 (×2): 10 mg via ORAL
  Filled 2023-08-09 (×3): qty 1

## 2023-08-09 MED ORDER — SODIUM CHLORIDE 0.9% IV SOLUTION: Freq: Once | INTRAVENOUS | Status: AC

## 2023-08-09 NOTE — H&P (Signed)
 History and Physical    Miguel Williams YQI:[id number] DOB: 05/11/1945 DOA: 08/09/2023  PCP: Rodrigo Ran, MD   Chief Complaint: Fall, anemia  HPI: Miguel Williams is a 78 y.o. male with medical history significant of bipolar, CKD stage III, COPD, Wernicke's encephalopathy, polysubstance use who presents emergency department due to anemia.  Patient was vacationing in [place] and had numerous falls.  He landed on the left thigh and developed a hematoma.  He states that his thighs become swollen.  He developed worsening lightheadedness and fatigue when walking so he saw his primary care doctor who checked his hemoglobin found to be low at 7.  His last hemoglobin per record was in August of last year which was 7.  He does have a history of bladder cancer and is followed with urology however he is not on active chemotherapy.  He was referred to the emergency department where he was found to be afebrile and hemodynamically stable.  Labs were obtained which showed hemoglobin 7.5, platelets 362.  Creatinine 1.5 at baseline.  Urinalysis negative for infection.  Patient was given 2 units blood transfusion and admitted for further workup.  Upon examination his left leg is severely swollen with a large hematoma.  Patient states he has had a recent endoscopic evaluation however not available in chart.  He has no overt bleeding.   Review of Systems: Review of Systems  Constitutional: Negative.  Negative for chills and fever.  HENT: Negative.    Eyes: Negative.   Respiratory: Negative.    Cardiovascular: Negative.   Gastrointestinal: Negative.  Negative for blood in stool and melena.  Genitourinary: Negative.   Musculoskeletal: Negative.   Skin: Negative.   Neurological: Negative.   Endo/Heme/Allergies: Negative.   Psychiatric/Behavioral: Negative.       As per HPI otherwise 10 point review of systems negative.   Allergies  Allergen Reactions   Allopurinol Other (See Comments)    Had a "bad reaction"    Bacitracin-Polymyxin B Hives   Baclofen Other (See Comments)    Hallucinations- required admit to hospital for adverse drug rxn.   Neomycin Other (See Comments)    "Allergic skin reaction"   Tegretol [Carbamazepine] Hives   Sulfa Antibiotics Hives and Rash    Past Medical History:  Diagnosis Date   Abdominal pain, lower    Agitation    Allergy, unspecified, initial encounter    Anxiety disorder    Arthritis    Bipolar disorder [facility])    Patient denies   Cancer [facility])    Bladder   Cancer (HCC)    Skin cancer   Chronic kidney disease, stage III (moderate) (HCC)    Chronic kidney disease, stage III (moderate) (HCC)    COPD (chronic obstructive pulmonary disease) (HCC)    Coronary atherosclerosis of native coronary artery    Esophageal reflux    Fatigue    Fungal granuloma    Hip pain    History of 2019 novel coronavirus disease (COVID-19)    History of endoscopy 02/00/2011   History of kidney stones    Hypertension    Hypo-osmolality and hyponatremia    Hypokalemia    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)    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)    Insomnia due to medical condition    Iron deficiency anemia secondary to blood loss (chronic)    Iron deficiency anemia, unspecified    Lability emotional    Low back pain    Moderate protein-calorie malnutrition (  HCC)    Pneumonia    PONV (postoperative nausea and vomiting)    Presence of left artificial hip joint    pt denies   Restlessness    Substance abuse (HCC)    alcohol   Unspecified fall, subsequent encounter    Vitamin D deficiency, unspecified    Wernicke's encephalopathy 06/23/2019    Past Surgical History:  Procedure Laterality Date   CATARACT EXTRACTION  04/25/2010   COLONOSCOPY W/ ENDOSCOPIC [place]     CYSTOSCOPY WITH FULGERATION N/A 03/09/2023   Procedure: CYSTOSCOPY WITH FULGERATION WITH CLOT EVACUATION;  Surgeon: Rene Paci, MD;  Location: [facility];  Service: Urology;  Laterality: N/A;   CYSTOSCOPY WITH URETHRAL DILATATION N/A 03/08/2023   Procedure: CYSTOSCOPY;  Surgeon: Rene Paci, MD;  Location: [facility];  Service: Urology;  Laterality: N/A;  30 MINUTES   ESOPHAGOGASTRODUODENOSCOPY     TONSILLECTOMY  04/25/1950     reports that he has never smoked. He has never used smokeless tobacco. He reports current alcohol use of about 7.0 standard drinks of alcohol per week. He reports that he does not currently use drugs.  Family History  Problem Relation Age of Onset   Lung cancer Father     Prior to Admission medications   Medication Sig Start Date End Date Taking? Authorizing Provider  acetaminophen (TYLENOL) 500 MG tablet Take 1 tablet (500 mg total) by mouth every 6 (six) hours as needed for mild pain, moderate pain or fever. Patient taking differently: Take 500-1,000 mg by mouth every 6 (six) hours as needed for mild pain (pain score 1-3), moderate pain (pain score 4-6), fever or headache. 04/07/19  Yes Jae Dire, MD  amLODipine (NORVASC) 10 MG tablet Take 10 mg by mouth at bedtime.   Yes [provider]  atorvastatin (LIPITOR) 40 MG tablet Take 40 mg by mouth in the morning.   Yes [provider]  betamethasone dipropionate 0.05 % cream Apply 1 Application topically 2 (two) times daily as needed (for irritation).   Yes [provider]  CLOBETASOL PROPIONATE E 0.05 % emollient cream Apply 1 Application topically daily as needed (irritation). 10/21/21  Yes [provider]  clonazePAM (KLONOPIN) 0.5 MG tablet Take 0.5 mg by mouth every evening. 10/21/21  Yes [provider]  colchicine 0.6 MG tablet Take 0.6 mg by mouth at bedtime.   Yes [provider]  DULoxetine (CYMBALTA) 60 MG capsule Take 60 mg by mouth at bedtime. 03/18/19  Yes [provider]  dutasteride (AVODART) 0.5 MG capsule Take 1 capsule (0.5 mg total) by mouth every other day. Patient  taking differently: Take 0.5 mg by mouth in the morning. 02/12/13  Yes Enid Baas, MD  ezetimibe (ZETIA) 10 MG tablet Take 10 mg by mouth in the morning. 11/01/21  Yes [provider]  FIBER PO Take 2 capsules by mouth in the morning.   Yes [provider]  levothyroxine (SYNTHROID) 50 MCG tablet Take 50 mcg by mouth daily before breakfast. 12/21/21  Yes [provider]  loperamide (IMODIUM A-D) 2 MG tablet Take 2 mg by mouth 4 (four) times daily as needed for diarrhea or loose stools.   Yes [provider]  omeprazole-sodium bicarbonate (ZEGERID) 40-1100 MG capsule Take 1 capsule by mouth at bedtime.   Yes [provider]  ondansetron (ZOFRAN-ODT) 4 MG disintegrating tablet Take 4-8 mg by mouth daily as needed for nausea or vomiting. 12/06/22  Yes [provider]  oxybutynin (DITROPAN-XL) 5 MG 24 hr tablet Take 5 mg by mouth at bedtime as needed (for bladder spasms).   Yes [provider]  probenecid (BENEMID) 500 MG tablet Take 1 tablet (500 mg total) by mouth daily. Patient taking differently: Take 500-1,000 mg by mouth See admin instructions. Take 500 mg by mouth in the morning and 1,000 mg in the evening 02/12/13  Yes Enid Baas, MD  SYSTANE COMPLETE PF 0.6 % SOLN Place 1 drop into both eyes 4 (four) times daily as needed (for dryness).   Yes [provider]  Tamsulosin HCl (FLOMAX) 0.4 MG CAPS TAKE 1 CAPSULE (0.4 MG TOTAL) BY MOUTH DAILY. Patient taking differently: Take 0.4 mg by mouth at bedtime. 04/04/12  Yes Enid Baas, MD  triamcinolone cream (KENALOG) 0.1 % Apply 1 Application topically 3 (three) times daily as needed (for itching or irritation).   Yes [provider]  valsartan (DIOVAN) 80 MG tablet Take 40 mg by mouth at bedtime. 03/19/19  Yes [provider]  COLCRYS 0.6 MG tablet TAKE 2 TABLETS (1.2 MG TOTAL) BY MOUTH 2 (TWO) TIMES DAILY. Patient not taking: Reported on 08/09/2023 04/04/12    Enid Baas, MD  ondansetron (ZOFRAN) 4 MG tablet Take 1 tablet (4 mg total) by mouth daily as needed for nausea or vomiting. Patient not taking: Reported on 08/09/2023 03/08/23 03/07/24  Rene Paci, MD  oxybutynin (DITROPAN) 5 MG tablet Take 1 tablet (5 mg total) by mouth every 8 (eight) hours as needed for bladder spasms. Patient not taking: Reported on 08/09/2023 03/08/23   Rene Paci, MD  oxyCODONE-acetaminophen (PERCOCET/ROXICET) 5-325 MG tablet Take 2 tablets by mouth every 6 (six) hours as needed for up to 10 doses for severe pain. Patient not taking: Reported on 08/09/2023 09/07/22   Rene Paci, MD  phenazopyridine (PYRIDIUM) 200 MG tablet Take 1 tablet (200 mg total) by mouth 3 (three) times daily as needed (for pain with urination). Patient not taking: Reported on 03/03/2023 09/07/22 09/07/23  Rene Paci, MD    Physical Exam: Vitals:   08/09/23 2145 08/09/23 2200 08/09/23 2207 08/09/23 2222  BP:   116/79 (!) 144/71  Pulse: 91 93 93   Resp: (!) 22 19 19    Temp:  98.1 F (36.7 C) 98.1 F (36.7 C) 98.6 F (37 C)  TempSrc:  Oral  Oral  SpO2: 100% 97%    Weight:      Height:       Physical Exam Vitals reviewed.  Constitutional:      Appearance: He is normal weight.  HENT:     Head: Normocephalic.  Eyes:     Conjunctiva/sclera: Conjunctivae normal.     Pupils: Pupils are equal, round, and reactive to light.  Cardiovascular:     Rate and Rhythm: Normal rate and regular rhythm.     Pulses: Normal pulses.     Heart sounds: Normal heart sounds.  Pulmonary:     Effort: Pulmonary effort is normal.     Breath sounds: Normal breath sounds.  Abdominal:     General: Abdomen is flat. Bowel sounds are normal.  Musculoskeletal:        General: Normal range of motion.     Cervical back: Normal range of motion.  Skin:    General: Skin is warm.     Capillary Refill: Capillary refill takes less than 2 seconds.  Neurological:      General: No focal deficit present.     Mental  Status: He is alert.  Psychiatric:        Mood and Affect: Mood normal.       Labs on Admission: I have personally reviewed the patients's labs and imaging studies.  Assessment/Plan Principal Problem:   Anemia   # Acute on chronic anemia most likely secondary to left leg hematoma -Patient had fall and has a large hematoma surrounding his entire left leg - Hemoglobin dropped from 12 13-7 - Patient symptomatic for lightheaded -no gi source of bleeding  Plan: Status post 2 units Check labs in the morning if heme hemoglobin stable patient will likely need outpatient workup for chronic anemia Check LDH, B12, folate, reticulocyte, haptoglobin, iron studies Obtain CT left leg with contrast  # History of bladder cancer  -continue to monitor  # Hypertension-continue amlodipine  # Hyperlipidemia-continue Lipitor  # Chronic pain-continue Cymbalta  # Hyperlipidemia-continue Zetia  # Hypertension-continue irbesartan  # Hypothyroidism-continue Synthroid  # History of gout-continue probenecid  # BPH-continue Flomax  # Warnicke's encephalopathy-continue thiamine supplementation  Admission status: Observation Telemetry  Certification: The appropriate patient status for this patient is OBSERVATION. Observation status is judged to be reasonable and necessary in order to provide the required intensity of service to ensure the patient's safety. The patient's presenting symptoms, physical exam findings, and initial radiographic and laboratory data in the context of their medical condition is felt to place them at decreased risk for further clinical deterioration. Furthermore, it is anticipated that the patient will be medically stable for discharge from the hospital within 2 midnights of admission.     Myrl Askew MD [facility] If 7PM-7AM, please contact night-coverage [email or web address]  08/09/2023, 11:53 PM

## 2023-08-09 NOTE — ED Notes (Signed)
Pt was given water. 

## 2023-08-09 NOTE — ED Provider Notes (Addendum)
 [place] EMERGENCY DEPARTMENT AT [facility] Provider Note   CSN: [id number] Arrival date & time: 08/09/23  1741     History  Chief Complaint  Patient presents with   Low Hemoglobin    Miguel Williams is a 78 y.o. male.  Patient sent in from [facility] for low hemoglobin.  Patient last Tuesday had a fall while on vacation in [place].  Did have a period of loss of consciousness.  Was evaluated at the local hospital did have CT head.  Patient has a lot of bruising to the medial aspect of the left thigh.  And thigh and leg is swollen.  Patient's hemoglobin from the office was 7.  Patient does talk about a little bit of lightheadedness when he is walking.  But otherwise no [place] complaints.  Patient is not on a blood thinner.  Patient had a history of bladder cancer back in 2024.  He also did struggle with urinary retention after the fall and a Foley catheter was placed.  No blood in the urine.  It is presumed that hemoglobin is low probably secondary to blood accumulation in the left leg.  Despite the significant leg swelling and the bruising patient is able to ambulate fine.  Past medical history significant for Warnicke's encephalopathy COPD chronic kidney disease stage II coronary artery disease history of alcohol substance abuse.  Bipolar disorder.  History of hypertension.  History of bladder cancer.  Followed by urology.  Bladder cancer I think was diagnosed in November 2024.  Patient is never used tobacco products.       Home Medications Prior to Admission medications   Medication Sig Start Date End Date Taking? Authorizing Provider  acetaminophen (TYLENOL) 500 MG tablet Take 1 tablet (500 mg total) by mouth every 6 (six) hours as needed for mild pain, moderate pain or fever. 04/07/19   Jae Dire, MD  atorvastatin (LIPITOR) 40 MG tablet Take 40 mg by mouth daily at 6 (six) AM.    [provider]  betamethasone dipropionate 0.05 % cream  Apply 1 Application topically 2 (two) times daily.    [provider]  CLOBETASOL PROPIONATE E 0.05 % emollient cream Apply 1 Application topically daily as needed (irritation). 10/21/21   [provider]  clonazePAM (KLONOPIN) 0.5 MG tablet Take 0.5 mg by mouth daily as needed for anxiety. 10/21/21   [provider]  COLCRYS 0.6 MG tablet TAKE 2 TABLETS (1.2 MG TOTAL) BY MOUTH 2 (TWO) TIMES DAILY. Patient taking differently: Take 1.2 mg by mouth 2 (two) times daily. 04/04/12   Enid Baas, MD  DULoxetine (CYMBALTA) 60 MG capsule Take 60 mg by mouth daily. 03/18/19   [provider]  dutasteride (AVODART) 0.5 MG capsule Take 1 capsule (0.5 mg total) by mouth every other day. Patient taking differently: Take 0.5 mg by mouth daily. 02/12/13   Enid Baas, MD  ezetimibe (ZETIA) 10 MG tablet Take 10 mg by mouth daily. 11/01/21   [provider]  ibuprofen (ADVIL) 400 MG tablet Take 400 mg by mouth every 6 (six) hours as needed.    [provider]  lansoprazole (PREVACID) 30 MG capsule Take 30 mg by mouth 2 (two) times daily. 10/12/21   [provider]  levothyroxine (SYNTHROID) 50 MCG tablet Take 50 mcg by mouth daily before breakfast. 12/21/21   [provider]  loperamide (IMODIUM) 2 MG capsule Take 2 mg by mouth as needed for diarrhea or loose stools.  [provider]  methocarbamol (ROBAXIN) 500 MG tablet Take 500 mg by mouth every 6 (six) hours as needed for muscle spasms. 10/18/21   [provider]  omeprazole-sodium bicarbonate (ZEGERID) 40-1100 MG capsule Take 1 capsule by mouth daily.    [provider]  ondansetron (ZOFRAN) 4 MG tablet Take 1 tablet (4 mg total) by mouth daily as needed for nausea or vomiting. 03/08/23 03/07/24  Rene Paci, MD  ondansetron (ZOFRAN-ODT) 4 MG disintegrating tablet Take 4 mg by mouth every 4 (four) hours as needed. 12/06/22   [provider]   oxybutynin (DITROPAN) 5 MG tablet Take 1 tablet (5 mg total) by mouth every 8 (eight) hours as needed for bladder spasms. 03/08/23   Rene Paci, MD  oxyCODONE-acetaminophen (PERCOCET/ROXICET) 5-325 MG tablet Take 2 tablets by mouth every 6 (six) hours as needed for up to 10 doses for severe pain. Patient not taking: Reported on 03/03/2023 09/07/22   Rene Paci, MD  phenazopyridine (PYRIDIUM) 200 MG tablet Take 1 tablet (200 mg total) by mouth 3 (three) times daily as needed (for pain with urination). Patient not taking: Reported on 03/03/2023 09/07/22 09/07/23  Rene Paci, MD  probenecid (BENEMID) 500 MG tablet Take 1 tablet (500 mg total) by mouth daily. 02/12/13   Enid Baas, MD  Tamsulosin HCl (FLOMAX) 0.4 MG CAPS TAKE 1 CAPSULE (0.4 MG TOTAL) BY MOUTH DAILY. Patient taking differently: Take 0.8 mg by mouth every evening. 04/04/12   Enid Baas, MD  valsartan (DIOVAN) 80 MG tablet Take 80 mg by mouth daily. 03/19/19   [provider]      Allergies    Allopurinol, Baclofen, Neomycin, Sulfonamide derivatives, Tegretol [carbamazepine], and Sulfa antibiotics    Review of Systems   Review of Systems  Constitutional:  Negative for chills and fever.  HENT:  Negative for ear pain and sore throat.   Eyes:  Negative for pain and visual disturbance.  Respiratory:  Negative for cough and shortness of breath.   Cardiovascular:  Positive for leg swelling. Negative for chest pain and palpitations.  Gastrointestinal:  Negative for abdominal pain and vomiting.  Genitourinary:  Negative for dysuria and hematuria.  Musculoskeletal:  Negative for arthralgias and back pain.  Skin:  Negative for color change and rash.  Neurological:  Positive for light-headedness. Negative for seizures and syncope.  All other systems reviewed and are negative.   Physical Exam Updated Vital Signs BP 131/80   Pulse 98   Temp 98.5 F (36.9 C) (Oral)   Resp 20   Ht  1.727 m (5\' 8" )   Wt 95.3 kg   SpO2 100%   BMI 31.93 kg/m  Physical Exam Vitals and nursing note reviewed.  Constitutional:      General: He is not in acute distress.    Appearance: Normal appearance. He is well-developed.  HENT:     Head: Normocephalic and atraumatic.  Eyes:     Extraocular Movements: Extraocular movements intact.     Conjunctiva/sclera: Conjunctivae normal.  Cardiovascular:     Rate and Rhythm: Normal rate and regular rhythm.     Heart sounds: No murmur heard. Pulmonary:     Effort: Pulmonary effort is normal. No respiratory distress.     Breath sounds: Normal breath sounds.  Abdominal:     Palpations: Abdomen is soft.     Tenderness: There is no abdominal tenderness.  Genitourinary:    Comments: Foley catheter in place. Musculoskeletal:  General: Signs of injury present. No swelling.     Cervical back: Normal range of motion and neck supple.     Left lower leg: Edema present.  Skin:    General: Skin is warm and dry.     Capillary Refill: Capillary refill takes less than 2 seconds.  Neurological:     Mental Status: He is alert.  Psychiatric:        Mood and Affect: Mood normal.     ED Results / Procedures / Treatments   Labs (all labs ordered are listed, but only abnormal results are displayed) Labs Reviewed  CBC WITH DIFFERENTIAL/PLATELET  COMPREHENSIVE METABOLIC PANEL WITH GFR  URINALYSIS, ROUTINE W REFLEX MICROSCOPIC  TYPE AND SCREEN    EKG EKG Interpretation Date/Time:  Wednesday August 09 2023 17:49:05 EDT Ventricular Rate:  96 PR Interval:  154 QRS Duration:  134 QT Interval:  390 QTC Calculation: 493 R Axis:   -38  Text Interpretation: Sinus rhythm Right bundle branch block No significant change since last tracing Confirmed by Vanetta Mulders [id number]) on 08/09/2023 6:26:04 PM  Radiology No results found.  Procedures Procedures    Medications Ordered in ED Medications - No data to display  ED Course/ Medical Decision  Making/ A&P                                 Medical Decision Making Amount and/or Complexity of Data Reviewed Labs: ordered.  Risk Prescription drug management. Decision regarding hospitalization.   I will get EKG patient is getting cardiac monitoring.  Will repeat CBC type and screen ordered.  Most likely the hemoglobin will be low and if it is in the low sevens feel that blood transfusion would be appropriate.  And admission.  Patient's hemoglobin here is 7.5.  Platelets are normal.  Complete metabolic panel pending.  Based on this and his cardiac history we will go ahead and transfused 2 units.  Will contact hospitalist for admission once we know the complete metabolic panel.  CRITICAL CARE Performed by: Vanetta Mulders Total critical care time: 45 minutes Critical care time was exclusive of separately billable procedures and treating other patients. Critical care was necessary to treat or prevent imminent or life-threatening deterioration. Critical care was time spent personally by me on the following activities: development of treatment plan with patient and/or surrogate as well as nursing, discussions with consultants, evaluation of patient's response to treatment, examination of patient, obtaining history from patient or surrogate, ordering and performing treatments and interventions, ordering and review of laboratory studies, ordering and review of radiographic studies, pulse oximetry and re-evaluation of patient's condition.   Patient's complete metabolic panel is back.  Significant for creatinine 1.55 given a GFR of 46 LFTs are normal glucose 127 electrolytes normal.  Anion gap is 5.  Think it is appropriate to probably transfuse him 2 units of packed red blood cells.  Clinically it seems that it was all secondary to the left leg swelling.  Not able to get a Doppler study at this time at night.  Will discuss with hospitalist.  This with hospitalist who will admit.  Blood  transfusion ordered.   Final Clinical Impression(s) / ED Diagnoses Final diagnoses:  Low hemoglobin    Rx / DC Orders ED Discharge Orders     None         Vanetta Mulders, MD 08/09/23 Cecilie Kicks, MD 08/09/23 2019  Naylani Bradner, MD 08/09/23 2056    Nicklas Barns, MD 08/09/23 0454    Nicklas Barns, MD 08/09/23 2351

## 2023-08-09 NOTE — ED Triage Notes (Signed)
 Labs drawn today and hemoglobin was 7, intermittent lightheadedness.

## 2023-08-10 ENCOUNTER — Observation Stay (HOSPITAL_COMMUNITY)

## 2023-08-10 ENCOUNTER — Telehealth (HOSPITAL_COMMUNITY): Payer: Self-pay

## 2023-08-10 DIAGNOSIS — S7012XA Contusion of left thigh, initial encounter: Secondary | ICD-10-CM | POA: Diagnosis present

## 2023-08-10 DIAGNOSIS — S8012XA Contusion of left lower leg, initial encounter: Secondary | ICD-10-CM | POA: Diagnosis present

## 2023-08-10 DIAGNOSIS — I2510 Atherosclerotic heart disease of native coronary artery without angina pectoris: Secondary | ICD-10-CM | POA: Diagnosis present

## 2023-08-10 DIAGNOSIS — I4510 Unspecified right bundle-branch block: Secondary | ICD-10-CM | POA: Diagnosis present

## 2023-08-10 DIAGNOSIS — Z79890 Hormone replacement therapy (postmenopausal): Secondary | ICD-10-CM | POA: Diagnosis not present

## 2023-08-10 DIAGNOSIS — Z87442 Personal history of urinary calculi: Secondary | ICD-10-CM | POA: Diagnosis not present

## 2023-08-10 DIAGNOSIS — Z96649 Presence of unspecified artificial hip joint: Secondary | ICD-10-CM | POA: Diagnosis present

## 2023-08-10 DIAGNOSIS — N401 Enlarged prostate with lower urinary tract symptoms: Secondary | ICD-10-CM | POA: Diagnosis present

## 2023-08-10 DIAGNOSIS — M7989 Other specified soft tissue disorders: Secondary | ICD-10-CM | POA: Diagnosis not present

## 2023-08-10 DIAGNOSIS — Z882 Allergy status to sulfonamides status: Secondary | ICD-10-CM | POA: Diagnosis not present

## 2023-08-10 DIAGNOSIS — Z801 Family history of malignant neoplasm of trachea, bronchus and lung: Secondary | ICD-10-CM | POA: Diagnosis not present

## 2023-08-10 DIAGNOSIS — Z881 Allergy status to other antibiotic agents status: Secondary | ICD-10-CM | POA: Diagnosis not present

## 2023-08-10 DIAGNOSIS — R710 Precipitous drop in hematocrit: Secondary | ICD-10-CM | POA: Diagnosis present

## 2023-08-10 DIAGNOSIS — Z85828 Personal history of other malignant neoplasm of skin: Secondary | ICD-10-CM | POA: Diagnosis not present

## 2023-08-10 DIAGNOSIS — J449 Chronic obstructive pulmonary disease, unspecified: Secondary | ICD-10-CM | POA: Diagnosis present

## 2023-08-10 DIAGNOSIS — D649 Anemia, unspecified: Secondary | ICD-10-CM | POA: Diagnosis present

## 2023-08-10 DIAGNOSIS — E039 Hypothyroidism, unspecified: Secondary | ICD-10-CM | POA: Diagnosis present

## 2023-08-10 DIAGNOSIS — E785 Hyperlipidemia, unspecified: Secondary | ICD-10-CM | POA: Diagnosis present

## 2023-08-10 DIAGNOSIS — Z8551 Personal history of malignant neoplasm of bladder: Secondary | ICD-10-CM | POA: Diagnosis not present

## 2023-08-10 DIAGNOSIS — Z8616 Personal history of COVID-19: Secondary | ICD-10-CM | POA: Diagnosis not present

## 2023-08-10 DIAGNOSIS — N1831 Chronic kidney disease, stage 3a: Secondary | ICD-10-CM | POA: Diagnosis present

## 2023-08-10 DIAGNOSIS — Z888 Allergy status to other drugs, medicaments and biological substances status: Secondary | ICD-10-CM | POA: Diagnosis not present

## 2023-08-10 DIAGNOSIS — F319 Bipolar disorder, unspecified: Secondary | ICD-10-CM | POA: Diagnosis present

## 2023-08-10 DIAGNOSIS — E512 Wernicke's encephalopathy: Secondary | ICD-10-CM | POA: Diagnosis present

## 2023-08-10 DIAGNOSIS — W19XXXA Unspecified fall, initial encounter: Secondary | ICD-10-CM | POA: Diagnosis present

## 2023-08-10 DIAGNOSIS — G8929 Other chronic pain: Secondary | ICD-10-CM | POA: Diagnosis present

## 2023-08-10 DIAGNOSIS — I129 Hypertensive chronic kidney disease with stage 1 through stage 4 chronic kidney disease, or unspecified chronic kidney disease: Secondary | ICD-10-CM | POA: Diagnosis present

## 2023-08-10 LAB — COMPREHENSIVE METABOLIC PANEL WITH GFR
ALT: 23 U/L (ref 0–44)
AST: 22 U/L (ref 15–41)
Albumin: 3 g/dL — ABNORMAL LOW (ref 3.5–5.0)
Alkaline Phosphatase: 65 U/L (ref 38–126)
Anion gap: 7 (ref 5–15)
BUN: 25 mg/dL — ABNORMAL HIGH (ref 8–23)
CO2: 24 mmol/L (ref 22–32)
Calcium: 8.4 mg/dL — ABNORMAL LOW (ref 8.9–10.3)
Chloride: 103 mmol/L (ref 98–111)
Creatinine, Ser: 1.28 mg/dL — ABNORMAL HIGH (ref 0.61–1.24)
GFR, Estimated: 57 mL/min — ABNORMAL LOW (ref >60)
Glucose, Bld: 136 mg/dL — ABNORMAL HIGH (ref 70–99)
Potassium: 4.2 mmol/L (ref 3.5–5.1)
Sodium: 134 mmol/L — ABNORMAL LOW (ref 135–145)
Total Bilirubin: 1.2 mg/dL (ref 0.0–1.2)
Total Protein: 5.9 g/dL — ABNORMAL LOW (ref 6.5–8.1)

## 2023-08-10 LAB — CBC
HCT: 27.3 % — ABNORMAL LOW (ref 39.0–52.0)
Hemoglobin: 8.8 g/dL — ABNORMAL LOW (ref 13.0–17.0)
MCH: 30.3 pg (ref 26.0–34.0)
MCHC: 32.2 g/dL (ref 30.0–36.0)
MCV: 94.1 fL (ref 80.0–100.0)
Platelets: 307 10*3/uL (ref 150–400)
RBC: 2.9 MIL/uL — ABNORMAL LOW (ref 4.22–5.81)
RDW: 17 % — ABNORMAL HIGH (ref 11.5–15.5)
WBC: 8.5 10*3/uL (ref 4.0–10.5)
nRBC: 1.1 % — ABNORMAL HIGH (ref 0.0–0.2)

## 2023-08-10 LAB — RETICULOCYTES
Immature Retic Fract: 46.9 % — ABNORMAL HIGH (ref 2.3–15.9)
RBC.: 2.9 MIL/uL — ABNORMAL LOW (ref 4.22–5.81)
Retic Count, Absolute: 175.7 10*3/uL (ref 19.0–186.0)
Retic Ct Pct: 6.1 % — ABNORMAL HIGH (ref 0.4–3.1)

## 2023-08-10 LAB — IRON AND TIBC
Iron: 63 ug/dL (ref 45–182)
Saturation Ratios: 26 % (ref 17.9–39.5)
TIBC: 244 ug/dL — ABNORMAL LOW (ref 250–450)
UIBC: 181 ug/dL

## 2023-08-10 LAB — FOLATE: Folate: 9.2 ng/mL (ref >5.9)

## 2023-08-10 LAB — FERRITIN: Ferritin: 79 ng/mL (ref 24–336)

## 2023-08-10 LAB — VITAMIN B12: Vitamin B-12: 383 pg/mL (ref 180–914)

## 2023-08-10 LAB — LACTATE DEHYDROGENASE: LDH: 219 U/L — ABNORMAL HIGH (ref 98–192)

## 2023-08-10 MED ORDER — CHLORHEXIDINE GLUCONATE CLOTH 2 % EX PADS
6.0000 | MEDICATED_PAD | Freq: Every day | CUTANEOUS | Status: DC
  Administered 2023-08-11: 6 via TOPICAL

## 2023-08-10 MED ORDER — THIAMINE MONONITRATE 100 MG PO TABS
100.0000 mg | ORAL_TABLET | Freq: Every day | ORAL | Status: DC
  Administered 2023-08-10 – 2023-08-11 (×2): 100 mg via ORAL
  Filled 2023-08-10 (×2): qty 1

## 2023-08-10 MED ORDER — IOHEXOL 300 MG/ML  SOLN
100.0000 mL | Freq: Once | INTRAMUSCULAR | Status: AC | PRN
  Administered 2023-08-10: 100 mL via INTRAVENOUS

## 2023-08-10 MED ORDER — PANTOPRAZOLE SODIUM 40 MG PO TBEC
40.0000 mg | DELAYED_RELEASE_TABLET | Freq: Every day | ORAL | Status: DC
  Administered 2023-08-10 – 2023-08-11 (×2): 40 mg via ORAL
  Filled 2023-08-10 (×2): qty 1

## 2023-08-10 MED ORDER — CYANOCOBALAMIN 1000 MCG/ML IJ SOLN
1000.0000 ug | Freq: Once | INTRAMUSCULAR | Status: AC
  Administered 2023-08-10: 1000 ug via INTRAMUSCULAR
  Filled 2023-08-10: qty 1

## 2023-08-10 NOTE — Progress Notes (Signed)
 Lower extremity venous duplex completed. Please see CV Procedures for preliminary results.  Estanislao Heimlich, RVT 08/10/23 1:39 PM

## 2023-08-10 NOTE — Evaluation (Signed)
 Physical Therapy One Time Evaluation Patient Details Name: Miguel Williams MRN: [id number] DOB: 06-03-1945 Today's Date: 08/10/2023  History of Present Illness  78 year old male presents to the hospital with complaints of lightheadedness.  He was recently in [place] about a week ago and has had number of falls, landed on the left thigh and developed a large area of bruising.  He was apparently hospitalized in [place] for couple of days, and underwent workup which included a brain MRI which per patient was unremarkable.  In addition, he has a history of bladder cancer and follows with urology, but apparently this is not an active issue.  Upon presentation here, he was found to be anemic with a hemoglobin of 7.5 and given blood transfusion.  Past medical history of bipolar disorder, CKD 3A, COPD, Warnicke's encephalopathy  Clinical Impression  Patient evaluated by Physical Therapy with no further acute PT needs identified. All education has been completed and the patient has no further questions. Pt ambulated in hallway good distance with RW. Pt has RW at home and reports he has a person to stay with him 24/7 upon d/c that is a Charity fundraiser.  Discussed OPPT for chronic back pain however pt does not feel this is needed at this time. No further follow-up Physical Therapy or equipment needs. PT is signing off. Thank you for this referral.         If plan is discharge home, recommend the following:     Can travel by private vehicle        Equipment Recommendations None recommended by PT  Recommendations for Other Services       Functional Status Assessment Patient has had a recent decline in their functional status and demonstrates the ability to make significant improvements in function in a reasonable and predictable amount of time.     Precautions / Restrictions Precautions Precautions: Fall      Mobility  Bed Mobility Overal bed mobility: Modified Independent                   Transfers Overall transfer level: Needs assistance Equipment used: Rolling walker (2 wheels) Transfers: Sit to/from Stand Sit to Stand: Contact guard assist, Supervision                Ambulation/Gait Ambulation/Gait assistance: Contact guard assist, Supervision Gait Distance (Feet): 350 Feet Assistive device: Rolling walker (2 wheels) Gait Pattern/deviations: Decreased stride length, Step-through pattern       General Gait Details: pt reports left leg feels a little weaker, no significant antalgic gait, denies dizziness, utilized RW as safety net however pt not heavily weight bearing on RW  Stairs            Wheelchair Mobility     Tilt Bed    Modified Rankin (Stroke Patients Only)       Balance Overall balance assessment: History of Falls (denies mechanical in nature; reports from dizziness or syncopal like episodes per his report)                                           Pertinent Vitals/Pain Pain Assessment Pain Assessment: Faces Faces Pain Scale: Hurts a little bit Pain Location: left leg Pain Descriptors / Indicators: Sore, Tender Pain Intervention(s): Repositioned, Monitored during session    Home Living Family/patient expects to be discharged to:: Private residence Living Arrangements: Alone Available Help at Discharge:  Available 24 hours/day (states he now has an Charity fundraiser that will stay with him 24/7 upon d/c) Type of Home: House         Home Layout: Able to live on main level with bedroom/bathroom Home Equipment: Rolling Walker (2 wheels)      Prior Function Prior Level of Function : Independent/Modified Independent                     Extremity/Trunk Assessment        Lower Extremity Assessment Lower Extremity Assessment: LLE deficits/detail;Generalized weakness LLE Deficits / Details: significant ecchymosis observed posterior leg       Communication   Communication Communication: No apparent  difficulties    Cognition Arousal: Alert Behavior During Therapy: WFL for tasks assessed/performed   PT - Cognitive impairments: No apparent impairments                         Following commands: Intact       Cueing       General Comments      Exercises     Assessment/Plan    PT Assessment Patient does not need any further PT services  PT Problem List         PT Treatment Interventions      PT Goals (Current goals can be found in the Care Plan section)  Acute Rehab PT Goals PT Goal Formulation: All assessment and education complete, DC therapy    Frequency       Co-evaluation               AM-PAC PT "6 Clicks" Mobility  Outcome Measure Help needed turning from your back to your side while in a flat bed without using bedrails?: None Help needed moving from lying on your back to sitting on the side of a flat bed without using bedrails?: None Help needed moving to and from a bed to a chair (including a wheelchair)?: None Help needed standing up from a chair using your arms (e.g., wheelchair or bedside chair)?: A Little Help needed to walk in hospital room?: A Little Help needed climbing 3-5 steps with a railing? : A Little 6 Click Score: 21    End of Session Equipment Utilized During Treatment: Gait belt Activity Tolerance: Patient tolerated treatment well Patient left: in bed;with call bell/phone within reach;with bed alarm set Nurse Communication: Mobility status PT Visit Diagnosis: Difficulty in walking, not elsewhere classified (R26.2)    Time: 4098-1191 PT Time Calculation (min) (ACUTE ONLY): 17 min   Charges:   PT Evaluation $PT Eval Low Complexity: 1 Low   PT General Charges $$ ACUTE PT VISIT: 1 Visit        Miguel Williams PT, DPT Physical Therapist Acute Rehabilitation Services Office: [phone]   Miguel Williams 08/10/2023, 12:05 PM

## 2023-08-10 NOTE — Progress Notes (Signed)
 PROGRESS NOTE  Miguel Williams FAO:[id number] DOB: 08/06/1945 DOA: 08/09/2023 PCP: Rodrigo Ran, MD   LOS: 0 days   Brief Narrative / Interim history: 78 year old male with history of bipolar disorder, CKD 3A, COPD, Warnicke's encephalopathy, presents to the hospital with complaints of lightheadedness.  He was recently in [place] about a week ago and has had number of falls, landed on the left thigh and developed a large area of bruising.  He was apparently hospitalized in [place] for couple of days, and underwent workup which included a brain MRI which per patient was unremarkable.  In addition, he has a history of bladder cancer and follows with urology, but apparently this is not an active issue.  Upon presentation here he was found to be anemic with a hemoglobin of 7.5.  He was given blood transfusion and was admitted to the hospital.  Subjective / 24h Interval events: Patient seen this morning after receiving blood transfusion and working with physical therapy, still feels lightheaded, slightly better than yesterday but far from baseline.  Assesement and Plan: Principal problem Acute on chronic anemia, symptomatic anemia-he does not have any significant hematoma on CT scan, but does have extensive bruising and I wonder whether there is a consumptive process related to that.  He has no bleeding, has been watching his stools carefully and did not notice any bright red blood or melena - He was transfused this morning, hemoglobin improved appropriately, continue to monitor  Active problems Lightheadedness-persistent mostly in the last few days, could be related to symptomatic anemia however did not really improve upon transfusion.  Obtain MRI of the brain as I do not have access to his [place] workup - Obtain orthostatic vital signs  History of bladder cancer -apparently had urinary retention in [place] when he was hospitalized a week ago and has a Foley in place.  He has had to have Foley in the  past, favor to continue right now  Left leg ecchymosis -pretty extensive, no clinical concern for compartment syndrome as it is not fully circumferential and has good peripheral pulses.  He is significantly swollen and we will obtain a lower extremity Doppler to rule out DVT.  Essential hypertension-continue home medications, his blood pressure is normal/high when laying down.  Orthostatics as above  Hyperlipidemia-continue statin  Warnicke's encephalopathy-alert and oriented x 4, continue thiamine supplementation  Hypothyroidism-continue Synthroid  Scheduled Meds:  amLODipine  10 mg Oral QHS   atorvastatin  40 mg Oral Daily   colchicine  0.6 mg Oral QHS   cyanocobalamin  1,000 mcg Intramuscular Once   DULoxetine  60 mg Oral QHS   ezetimibe  10 mg Oral Daily   irbesartan  37.5 mg Oral Daily   levothyroxine  50 mcg Oral Q0600   pantoprazole  40 mg Oral Daily   probenecid  500 mg Oral Daily   tamsulosin  0.4 mg Oral Daily   thiamine  100 mg Oral Daily   Continuous Infusions: PRN Meds:.acetaminophen **OR** acetaminophen, HYDROcodone-acetaminophen, ondansetron **OR** ondansetron (ZOFRAN) IV, oxybutynin  Current Outpatient Medications  Medication Instructions   acetaminophen (TYLENOL) 500 mg, Oral, Every 6 hours PRN   amLODipine (NORVASC) 10 mg, Oral, Daily at bedtime   atorvastatin (LIPITOR) 40 mg, Every morning   betamethasone dipropionate 0.05 % cream 1 Application, 2 times daily PRN   CLOBETASOL PROPIONATE E 0.05 % emollient cream 1 Application, Daily PRN   clonazePAM (KLONOPIN) 0.5 mg, Every evening   colchicine 0.6 mg, Oral, Daily at bedtime   COLCRYS  0.6 MG tablet TAKE 2 TABLETS (1.2 MG TOTAL) BY MOUTH 2 (TWO) TIMES DAILY.   DULoxetine (CYMBALTA) 60 mg, Daily at bedtime   dutasteride (AVODART) 0.5 mg, Oral, Every other day   ezetimibe (ZETIA) 10 mg, Every morning   FIBER PO 2 capsules, Every morning   levothyroxine (SYNTHROID) 50 mcg, Daily before breakfast   loperamide  (IMODIUM A-D) 2 mg, Oral, 4 times daily PRN   omeprazole-sodium bicarbonate (ZEGERID) 40-1100 MG capsule 1 capsule, Oral, Daily at bedtime   ondansetron (ZOFRAN) 4 mg, Oral, Daily PRN   ondansetron (ZOFRAN-ODT) 4-8 mg, Daily PRN   oxybutynin (DITROPAN) 5 mg, Oral, Every 8 hours PRN   oxybutynin (DITROPAN-XL) 5 mg, Oral, At bedtime PRN   oxyCODONE-acetaminophen (PERCOCET/ROXICET) 5-325 MG tablet 2 tablets, Oral, Every 6 hours PRN   phenazopyridine (PYRIDIUM) 200 mg, Oral, 3 times daily PRN   probenecid (BENEMID) 500 mg, Oral, Daily   SYSTANE COMPLETE PF 0.6 % SOLN 1 drop, 4 times daily PRN   Tamsulosin HCl (FLOMAX) 0.4 MG CAPS TAKE 1 CAPSULE (0.4 MG TOTAL) BY MOUTH DAILY.   triamcinolone cream (KENALOG) 0.1 % 1 Application, Topical, 3 times daily PRN   valsartan (DIOVAN) 40 mg, Oral, Daily at bedtime    Diet Orders (From admission, onward)     Start     Ordered   08/09/23 2352  Diet regular Room service appropriate? Yes; Fluid consistency: Thin  Diet effective now       Question Answer Comment  Room service appropriate? Yes   Fluid consistency: Thin      08/09/23 2352            DVT prophylaxis: SCDs Start: 08/09/23 2352   Lab Results  Component Value Date   PLT 307 08/10/2023      Code Status: Full Code  Family Communication: no family at bedside   Status is: Observation The patient will require care spanning > 2 midnights and should be moved to inpatient because: Persistent symptoms, further workup needed   Level of care: Telemetry  Consultants:  none  Objective: Vitals:   08/10/23 0037 08/10/23 0104 08/10/23 0245 08/10/23 0245  BP: 135/76 (!) 157/83 (!) 157/83 (!) 157/85  Pulse:   92 98  Resp:   16 16  Temp: 98.5 F (36.9 C) 98.1 F (36.7 C) 98.1 F (36.7 C) 98.3 F (36.8 C)  TempSrc: Oral Oral Oral   SpO2: 95%  96% 95%  Weight:      Height:        Intake/Output Summary (Last 24 hours) at 08/10/2023 1130 Last data filed at 08/10/2023 0700 Gross  per 24 hour  Intake 709.01 ml  Output 1700 ml  Net -990.99 ml   Wt Readings from Last 3 Encounters:  08/09/23 95.3 kg  03/09/23 95.3 kg  03/08/23 88.5 kg    Examination:  Constitutional: NAD Eyes: no scleral icterus ENMT: Mucous membranes are moist.  Neck: normal, supple Respiratory: clear to auscultation bilaterally, no wheezing, no crackles. Normal respiratory effort. No accessory muscle use.  Cardiovascular: Regular rate and rhythm, no murmurs / rubs / gallops. No LE edema.  Abdomen: non distended, no tenderness. Bowel sounds positive.  Musculoskeletal: no clubbing / cyanosis.  Significant left lower extremity swelling all the way up to the thigh  Data Reviewed: I have independently reviewed following labs and imaging studies   CBC Recent Labs  Lab 08/09/23 1950 08/10/23 0609  WBC 10.3 8.5  HGB 7.5* 8.8*  HCT 23.7* 27.3*  PLT 362 307  MCV 101.7* 94.1  MCH 32.2 30.3  MCHC 31.6 32.2  RDW 15.3 17.0*  LYMPHSABS 0.8  --   MONOABS 0.9  --   EOSABS 0.5  --   BASOSABS 0.1  --     Recent Labs  Lab 08/09/23 1950 08/10/23 0609  NA 138 134*  K 4.4 4.2  CL 105 103  CO2 28 24  GLUCOSE 127* 136*  BUN 29* 25*  CREATININE 1.55* 1.28*  CALCIUM 8.7* 8.4*  AST 25 22  ALT 24 23  ALKPHOS 66 65  BILITOT 1.1 1.2  ALBUMIN 3.4* 3.0*    ------------------------------------------------------------------------------------------------------------------ No results for input(s): "CHOL", "HDL", "LDLCALC", "TRIG", "CHOLHDL", "LDLDIRECT" in the last 72 hours.  Lab Results  Component Value Date   HGBA1C 4.9 06/20/2012   ------------------------------------------------------------------------------------------------------------------ No results for input(s): "TSH", "T4TOTAL", "T3FREE", "THYROIDAB" in the last 72 hours.  Invalid input(s): "FREET3"  Cardiac Enzymes No results for input(s): "CKMB", "TROPONINI", "MYOGLOBIN" in the last 168 hours.  Invalid input(s):  "CK" ------------------------------------------------------------------------------------------------------------------ No results found for: "BNP"  CBG: No results for input(s): "GLUCAP" in the last 168 hours.  No results found for this or any previous visit (from the past 240 hours).   Radiology Studies: CT EXTREMITY LOWER LEFT W CONTRAST Result Date: 08/10/2023 CLINICAL DATA:  Left lower extremity bruising EXAM: CT OF THE LOWER LEFT EXTREMITY WITH CONTRAST TECHNIQUE: Multidetector CT imaging of the lower left extremity was performed according to the standard protocol following intravenous contrast administration. RADIATION DOSE REDUCTION: This exam was performed according to the departmental dose-optimization program which includes automated exposure control, adjustment of the mA and/or kV according to patient size and/or use of iterative reconstruction technique. CONTRAST:  100mL OMNIPAQUE IOHEXOL 300 MG/ML  SOLN COMPARISON:  None Available. FINDINGS: Bones/Joint/Cartilage Bony structures appear within normal limits without evidence of acute fracture or dislocation. No joint effusion is seen. Ligaments Suboptimally assessed by CT. Muscles and Tendons Surrounding musculature is within normal limits. No focal hematoma is noted. Soft tissues Mild atherosclerotic calcifications are noted in the vasculature of the left lower extremity. Timing was not performed for arteriographic evaluation although the arterial structures appear patent throughout the thigh and calf. Diffuse subcutaneous edema is identified. No focal hematoma is identified. The degree of edema is worst about the knee joint. IMPRESSION: No acute bony abnormality noted. Diffuse subcutaneous edema without focal hematoma. Electronically Signed   By: Violeta Grey M.D.   On: 08/10/2023 02:25     Kathlen Para, MD, PhD [facility]  Between 7 am - 7 pm I am available, please contact me via Amion (for emergencies) or Securechat (non  urgent messages)  Between 7 pm - 7 am I am not available, please contact night coverage MD/APP via Amion

## 2023-08-10 NOTE — TOC CM/SW Note (Signed)
 Transition of Care [facility]) - Inpatient Brief Assessment   Patient Details  Name: Miguel Williams MRN: [id number] Date of Birth: [birth year]  [facility] of [facility] CM/SW Contact:    Jonni Nettle, LCSW Phone Number: 08/10/2023, 12:19 PM   Clinical Narrative: No TOC needs identified.    Transition of Care Asessment: Insurance and Status: Insurance coverage has been reviewed Patient has primary care physician: Yes Home environment has been reviewed: single family home Prior level of function:: modified independent Prior/Current Home Services: No current home services Social Drivers of Health Review: SDOH reviewed no interventions necessary Readmission risk has been reviewed: Yes (N/A) Transition of care needs: no transition of care needs at this time   [place], LCSW 08/10/2023 12:19 PM

## 2023-08-10 NOTE — Plan of Care (Signed)

## 2023-08-10 NOTE — ED Notes (Signed)
 [place] PIV placed.

## 2023-08-10 NOTE — Plan of Care (Signed)

## 2023-08-10 NOTE — Telephone Encounter (Signed)
 Recieved request for patient's stat ultrasound, at 5:04 pm on 08/09/23, by time of processing patient is in ED at [facility], with scheudled CT. due to occurence, unable to contact patient. Left voicemail with referring office.

## 2023-08-11 DIAGNOSIS — D649 Anemia, unspecified: Secondary | ICD-10-CM | POA: Diagnosis not present

## 2023-08-11 LAB — CBC
HCT: 28.7 % — ABNORMAL LOW (ref 39.0–52.0)
Hemoglobin: 9.3 g/dL — ABNORMAL LOW (ref 13.0–17.0)
MCH: 31 pg (ref 26.0–34.0)
MCHC: 32.4 g/dL (ref 30.0–36.0)
MCV: 95.7 fL (ref 80.0–100.0)
Platelets: 318 10*3/uL (ref 150–400)
RBC: 3 MIL/uL — ABNORMAL LOW (ref 4.22–5.81)
RDW: 16.7 % — ABNORMAL HIGH (ref 11.5–15.5)
WBC: 7.6 10*3/uL (ref 4.0–10.5)
nRBC: 0.9 % — ABNORMAL HIGH (ref 0.0–0.2)

## 2023-08-11 LAB — TYPE AND SCREEN
ABO/RH(D): A POS
Antibody Screen: NEGATIVE
Unit division: 0
Unit division: 0
Unit division: 0

## 2023-08-11 LAB — COMPREHENSIVE METABOLIC PANEL WITH GFR
ALT: 23 U/L (ref 0–44)
AST: 19 U/L (ref 15–41)
Albumin: 3.1 g/dL — ABNORMAL LOW (ref 3.5–5.0)
Alkaline Phosphatase: 66 U/L (ref 38–126)
Anion gap: 9 (ref 5–15)
BUN: 24 mg/dL — ABNORMAL HIGH (ref 8–23)
CO2: 24 mmol/L (ref 22–32)
Calcium: 8.8 mg/dL — ABNORMAL LOW (ref 8.9–10.3)
Chloride: 106 mmol/L (ref 98–111)
Creatinine, Ser: 1.18 mg/dL (ref 0.61–1.24)
GFR, Estimated: >60 mL/min (ref >60)
Glucose, Bld: 133 mg/dL — ABNORMAL HIGH (ref 70–99)
Potassium: 4 mmol/L (ref 3.5–5.1)
Sodium: 139 mmol/L (ref 135–145)
Total Bilirubin: 1.3 mg/dL — ABNORMAL HIGH (ref 0.0–1.2)
Total Protein: 5.8 g/dL — ABNORMAL LOW (ref 6.5–8.1)

## 2023-08-11 LAB — BPAM RBC
Blood Product Expiration Date: 202505152359
Blood Product Expiration Date: 202505152359
Blood Product Expiration Date: 202505152359
ISSUE DATE / TIME: 202504162150
ISSUE DATE / TIME: 202504170030
ISSUE DATE / TIME: 202504170323
Unit Type and Rh: 6200
Unit Type and Rh: 6200
Unit Type and Rh: 6200

## 2023-08-11 LAB — PROTIME-INR
INR: 1.1 (ref 0.8–1.2)
Prothrombin Time: 14.5 s (ref 11.4–15.2)

## 2023-08-11 LAB — HAPTOGLOBIN: Haptoglobin: 140 mg/dL (ref 34–355)

## 2023-08-11 LAB — PHOSPHORUS: Phosphorus: 3.1 mg/dL (ref 2.5–4.6)

## 2023-08-11 LAB — APTT: aPTT: 30 s (ref 24–36)

## 2023-08-11 LAB — MAGNESIUM: Magnesium: 1.9 mg/dL (ref 1.7–2.4)

## 2023-08-11 MED ORDER — VITAMIN B-1 100 MG PO TABS: 100.0000 mg | ORAL_TABLET | Freq: Every day | ORAL | Status: AC

## 2023-08-11 MED ORDER — FERROUS SULFATE 325 (65 FE) MG PO TBEC: 325.0000 mg | DELAYED_RELEASE_TABLET | Freq: Two times a day (BID) | ORAL | 0 refills | Status: DC

## 2023-08-11 MED ORDER — LACTATED RINGERS IV BOLUS
1000.0000 mL | Freq: Once | INTRAVENOUS | Status: AC
  Administered 2023-08-11: 1000 mL via INTRAVENOUS

## 2023-08-11 NOTE — Plan of Care (Signed)
 No acute events this shift. Pt anticipates discharge today. Problem: Education: Goal: Knowledge of General Education information will improve Description: Including pain rating scale, medication(s)/side effects and non-pharmacologic comfort measures Outcome: Progressing   Problem: Health Behavior/Discharge Planning: Goal: Ability to manage health-related needs will improve Outcome: Progressing   Problem: Clinical Measurements: Goal: Ability to maintain clinical measurements within normal limits will improve Outcome: Progressing Goal: Will remain free from infection Outcome: Progressing Goal: Diagnostic test results will improve Outcome: Progressing Goal: Respiratory complications will improve Outcome: Progressing Goal: Cardiovascular complication will be avoided Outcome: Progressing   Problem: Activity: Goal: Risk for activity intolerance will decrease Outcome: Progressing   Problem: Nutrition: Goal: Adequate nutrition will be maintained Outcome: Progressing   Problem: Coping: Goal: Level of anxiety will decrease Outcome: Progressing   Problem: Elimination: Goal: Will not experience complications related to bowel motility Outcome: Progressing Goal: Will not experience complications related to urinary retention Outcome: Progressing   Problem: Pain Managment: Goal: General experience of comfort will improve and/or be controlled Outcome: Progressing   Problem: Safety: Goal: Ability to remain free from injury will improve Outcome: Progressing   Problem: Skin Integrity: Goal: Risk for impaired skin integrity will decrease Outcome: Progressing

## 2023-08-11 NOTE — Progress Notes (Signed)
 AVS reviewed with patient at bedside. All questions answered, and patient verbalized understanding. IV removed per order without complications. Patient to be discharged home via vehicle. Foley catheter intact. Patient escorted to main entrance via WC by staff.

## 2023-08-11 NOTE — Discharge Summary (Signed)
 Miguel Williams XBJ:[id number] DOB: Nov 14, 1945 DOA: 08/09/2023  PCP: Aldo Hun, MD  Admit date: 08/09/2023  Discharge date: 08/11/2023  Admitted From: Home   disposition: Home   Recommendations for Outpatient Follow-up:   Follow up with PCP in 1-2 weeks for H&H check as well as how long to keep taking iron. H&H are stable, PT and INR are WNL, platelets are WNL   Home Health: ***   Equipment/Devices: ***  Consultations: *** Discharge Condition: ***   CODE STATUS: ***   Diet Recommendation: Heart Healthy ***  Diet Order             Diet regular Room service appropriate? Yes; Fluid consistency: Thin  Diet effective now                    Chief Complaint  Patient presents with   Low Hemoglobin     Brief history of present illness from the day of admission and additional interim summary      [facility] Course    ***   Discharge diagnosis     Principal Problem:   Anemia Active Problems:   Symptomatic anemia    Discharge instructions    Discharge Instructions     Discharge instructions   Complete by: As directed    Remember to keep your leg elevated when you are at rest but it is important that you walk about frequently as this will help decrease the swelling and increase how quickly the blood is taken back into your body.  Please go see your PCP in 1 week to make sure you are still doing well.  At that time they can recheck your blood count and also tell you how long you need to be taking the iron for.  Do not drink any further alcohol .  Be careful not to fall.   Increase activity slowly   Complete by: As directed        Discharge Medications   Allergies as of 08/11/2023       Reactions   Allopurinol Other (See  Comments)   Had a "bad reaction"   Bacitracin-polymyxin B Hives   Baclofen Other (See Comments)   Hallucinations- required admit to hospital for adverse drug rxn.   Neomycin Other (See Comments)   "Allergic skin reaction"   Tegretol  [carbamazepine ] Hives   Sulfa Antibiotics Hives, Rash        Medication List     STOP taking these medications    ondansetron  4 MG disintegrating tablet Commonly known as: ZOFRAN -ODT   ondansetron  4 MG tablet Commonly known as: Zofran    phenazopyridine  200 MG tablet Commonly known as:  Pyridium        TAKE these medications    acetaminophen  500 MG tablet Commonly known as: TYLENOL  Take 1 tablet (500 mg total) by mouth every 6 (six) hours as needed for mild pain, moderate pain or fever. What changed:  how much to take reasons to take this   amLODipine  10 MG tablet Commonly known as: NORVASC  Take 10 mg by mouth at bedtime.   atorvastatin  40 MG tablet Commonly known as: LIPITOR Take 40 mg by mouth in the morning.   betamethasone dipropionate 0.05 % cream Apply 1 Application topically 2 (two) times daily as needed (for irritation).   Clobetasol Propionate E 0.05 % emollient cream Generic drug: Clobetasol Prop Emollient Base Apply 1 Application topically daily as needed (irritation).   clonazePAM  0.5 MG tablet Commonly known as: KLONOPIN  Take 0.5 mg by mouth every evening.   colchicine  0.6 MG tablet Take 0.6 mg by mouth at bedtime. What changed: Another medication with the same name was removed. Continue taking this medication, and follow the directions you see here.   DULoxetine  60 MG capsule Commonly known as: CYMBALTA  Take 60 mg by mouth at bedtime.   dutasteride  0.5 MG capsule Commonly known as: Avodart  Take 1 capsule (0.5 mg total) by mouth every other day. What changed: when to take this   ezetimibe  10 MG tablet Commonly known as: ZETIA  Take 10 mg by mouth in the morning.   ferrous sulfate  325 (65 FE) MG EC  tablet Take 1 tablet (325 mg total) by mouth 2 (two) times daily.   FIBER PO Take 2 capsules by mouth in the morning.   levothyroxine  50 MCG tablet Commonly known as: SYNTHROID  Take 50 mcg by mouth daily before breakfast.   loperamide 2 MG tablet Commonly known as: IMODIUM A-D Take 2 mg by mouth 4 (four) times daily as needed for diarrhea or loose stools.   omeprazole -sodium bicarbonate  40-1100 MG capsule Commonly known as: ZEGERID  Take 1 capsule by mouth at bedtime.   oxybutynin  5 MG 24 hr tablet Commonly known as: DITROPAN -XL Take 5 mg by mouth at bedtime as needed (for bladder spasms). What changed: Another medication with the same name was removed. Continue taking this medication, and follow the directions you see here.   oxyCODONE -acetaminophen  5-325 MG tablet Commonly known as: PERCOCET/ROXICET Take 2 tablets by mouth every 6 (six) hours as needed for up to 10 doses for severe pain.   probenecid  500 MG tablet Commonly known as: BENEMID  Take 1 tablet (500 mg total) by mouth daily. What changed:  how much to take when to take this additional instructions   Systane Complete PF 0.6 % Soln Generic drug: Propylene Glycol (PF) Place 1 drop into both eyes 4 (four) times daily as needed (for dryness).   tamsulosin  0.4 MG Caps capsule Commonly known as: FLOMAX  TAKE 1 CAPSULE (0.4 MG TOTAL) BY MOUTH DAILY. What changed: when to take this   thiamine  100 MG tablet Commonly known as: Vitamin B-1 Take 1 tablet (100 mg total) by mouth daily. Start taking on: August 12, 2023   triamcinolone cream 0.1 % Commonly known as: KENALOG Apply 1 Application topically 3 (three) times daily as needed (for itching or irritation).   valsartan 80 MG tablet Commonly known as: DIOVAN Take 40 mg by mouth at bedtime.          Major procedures and Radiology Reports - PLEASE review detailed and final reports thoroughly  -       *** VAS US   LOWER EXTREMITY VENOUS (DVT) Result Date:  08/11/2023  Lower Venous DVT Study Patient Name:  Miguel Williams  Date of Exam:   08/10/2023 Medical Rec #: [id number]      Accession #:    [id number] Date of Birth: October 28, 1945      Patient Gender: M Patient Age:   78 years Exam Location:  [facility] Procedure:      VAS US  LOWER EXTREMITY VENOUS (DVT) Referring Phys: Kathlen Para --------------------------------------------------------------------------------  Indications: Swelling, and Edema.  Risk Factors: Trauma Recent falls. Limitations: Poor ultrasound/tissue interface. Comparison Study: None. Performing Technologist: Estanislao Heimlich  Examination Guidelines: A complete evaluation includes B-mode imaging, spectral Doppler, color Doppler, and power Doppler as needed of all accessible portions of each vessel. Bilateral testing is considered an integral part of a complete examination. Limited examinations for reoccurring indications may be performed as noted. The reflux portion of the exam is performed with the patient in reverse Trendelenburg.  +-----+---------------+---------+-----------+----------+--------------+ RIGHTCompressibilityPhasicitySpontaneityPropertiesThrombus Aging +-----+---------------+---------+-----------+----------+--------------+ CFV  Full           Yes      Yes                                 +-----+---------------+---------+-----------+----------+--------------+   +---------+---------------+---------+-----------+----------+-------------------+ LEFT     CompressibilityPhasicitySpontaneityPropertiesThrombus Aging      +---------+---------------+---------+-----------+----------+-------------------+ CFV      Full           Yes      Yes                                      +---------+---------------+---------+-----------+----------+-------------------+ SFJ      Full                                                             +---------+---------------+---------+-----------+----------+-------------------+  FV Prox  Full                                                             +---------+---------------+---------+-----------+----------+-------------------+ FV Mid                           Yes                  Not well visualized +---------+---------------+---------+-----------+----------+-------------------+ FV Distal                        Yes                  Not well visualized +---------+---------------+---------+-----------+----------+-------------------+ PFV      Full                                                             +---------+---------------+---------+-----------+----------+-------------------+ POP  Full           Yes      Yes                                      +---------+---------------+---------+-----------+----------+-------------------+ PTV      Full                    Yes                                      +---------+---------------+---------+-----------+----------+-------------------+ PERO                             Yes                  Not well visualized +---------+---------------+---------+-----------+----------+-------------------+     Summary: RIGHT: - No evidence of common femoral vein obstruction.   LEFT: - There is no evidence of deep vein thrombosis in the lower extremity.  - No cystic structure found in the popliteal fossa.  *See table(s) above for measurements and observations. Electronically signed by Delaney Fearing on 08/11/2023 at 9:07:41 AM.    Final    MR BRAIN WO CONTRAST Result Date: 08/10/2023 CLINICAL DATA:  Mental status change, unknown cause EXAM: MRI HEAD WITHOUT CONTRAST TECHNIQUE: Multiplanar, multiecho pulse sequences of the brain and surrounding structures were obtained without intravenous contrast. COMPARISON:  CT head February 27, 23. FINDINGS: Brain: No acute infarction, hemorrhage, hydrocephalus, extra-axial collection or mass lesion. Mild for age patchy T2/FLAIR hyperintensities the white matter,  compatible with chronic microvascular ischemic change. Vascular: Normal flow voids. Skull and upper cervical spine: Normal marrow signal. Sinuses/Orbits: Negative. IMPRESSION: No evidence of acute intracranial abnormality. Electronically Signed   By: Stevenson Elbe M.D.   On: 08/10/2023 19:52   CT EXTREMITY LOWER LEFT W CONTRAST Result Date: 08/10/2023 CLINICAL DATA:  Left lower extremity bruising EXAM: CT OF THE LOWER LEFT EXTREMITY WITH CONTRAST TECHNIQUE: Multidetector CT imaging of the lower left extremity was performed according to the standard protocol following intravenous contrast administration. RADIATION DOSE REDUCTION: This exam was performed according to the departmental dose-optimization program which includes automated exposure control, adjustment of the mA and/or kV according to patient size and/or use of iterative reconstruction technique. CONTRAST:  OMNIPAQUE  IOHEXOL  300 MG/ML  SOLN COMPARISON:  None Available. FINDINGS: Bones/Joint/Cartilage Bony structures appear within normal limits without evidence of acute fracture or dislocation. No joint effusion is seen. Ligaments Suboptimally assessed by CT. Muscles and Tendons Surrounding musculature is within normal limits. No focal hematoma is noted. Soft tissues Mild atherosclerotic calcifications are noted in the vasculature of the left lower extremity. Timing was not performed for arteriographic evaluation although the arterial structures appear patent throughout the thigh and calf. Diffuse subcutaneous edema is identified. No focal hematoma is identified. The degree of edema is worst about the knee joint. IMPRESSION: No acute bony abnormality noted. Diffuse subcutaneous edema without focal hematoma. Electronically Signed   By: Violeta Grey M.D.   On: 08/10/2023 02:25    Micro Results   *** No results found for this or any previous visit (from the past 240 hours).  Today   Subjective    Miguel Williams feels much improved since  admission.  Feels ready to go home.  Denies chest pain, shortness of breath or abdominal pain.  Feels they can take care of themselves with the resources they have at home.  Objective   Blood pressure (!) 140/77, pulse 84, temperature 98.2 F (36.8 C), temperature source Oral, resp. rate 20, height 5\' 8"  (1.727 m), weight 95.3 kg, SpO2 98%.   Intake/Output Summary (Last 24 hours) at 08/11/2023 1509 Last data filed at 08/11/2023 1340 Gross per 24 hour  Intake 221.53 ml  Output 900 ml  Net -678.47 ml    Exam General: Patient appears well and in good spirits sitting up in bed in no acute distress.  Eyes: sclera anicteric, conjuctiva mild injection bilaterally CVS: S1-S2, regular  Respiratory:  decreased air entry bilaterally secondary to decreased inspiratory effort, rales at bases  GI: NABS, soft, NT  LE: No edema.  Neuro: A/O x 3, Moving all extremities equally with normal strength, CN 3-12 intact, grossly nonfocal.  Psych: patient is logical and coherent, judgement and insight appear normal, mood and affect appropriate to situation.    Data Review   CBC w Diff:  Lab Results  Component Value Date   WBC 7.6 08/11/2023   HGB 9.3 (L) 08/11/2023   HCT 28.7 (L) 08/11/2023   PLT 318 08/11/2023   LYMPHOPCT 8 08/09/2023   MONOPCT 9 08/09/2023   EOSPCT 5 08/09/2023   BASOPCT 1 08/09/2023    CMP:  Lab Results  Component Value Date   NA 139 08/11/2023   K 4.0 08/11/2023   CL 106 08/11/2023   CO2 24 08/11/2023   BUN 24 (H) 08/11/2023   CREATININE 1.18 08/11/2023   CREATININE 1.17 05/16/2012   PROT 5.8 (L) 08/11/2023   ALBUMIN 3.1 (L) 08/11/2023   BILITOT 1.3 (H) 08/11/2023   ALKPHOS 66 08/11/2023   AST 19 08/11/2023   ALT 23 08/11/2023  .   Total Time in preparing paper work, data evaluation and todays exam - 35 minutes  Miguel Williams M.D on 08/11/2023 at 3:09 PM  [facility]

## 2023-08-11 NOTE — Progress Notes (Signed)
 Mobility Specialist - Progress Note   08/11/23 1045  Mobility  Activity Ambulated with assistance to bathroom;Ambulated with assistance in hallway  Level of Assistance Standby assist, set-up cues, supervision of patient - no hands on  Assistive Device Front wheel walker  Distance Ambulated (ft) 450 ft  Range of Motion/Exercises Active  Activity Response Tolerated well  Mobility Referral Yes  Mobility visit 1 Mobility  Mobility Specialist Start Time (ACUTE ONLY) 1020  Mobility Specialist Stop Time (ACUTE ONLY) 1045  Mobility Specialist Time Calculation (min) (ACUTE ONLY) 25 min   Pt was found in bed and agreeable to ambulate. C/o dizziness with sitting EOB and able to resolve. At EOS returned to bed with all needs met. Call bell in reach and bed alarm on.  Miguel Williams Mobility Specialist

## 2023-09-13 ENCOUNTER — Other Ambulatory Visit (HOSPITAL_COMMUNITY): Payer: Self-pay | Admitting: Neurosurgery

## 2023-09-13 ENCOUNTER — Encounter: Payer: Self-pay | Admitting: Neurosurgery

## 2023-09-13 DIAGNOSIS — M5440 Lumbago with sciatica, unspecified side: Secondary | ICD-10-CM

## 2023-09-14 ENCOUNTER — Ambulatory Visit (HOSPITAL_COMMUNITY)
Admission: RE | Admit: 2023-09-14 | Discharge: 2023-09-14 | Disposition: A | Discharge status: Home / Self Care | Source: Ambulatory Visit | Attending: Neurosurgery | Admitting: Neurosurgery

## 2023-09-14 DIAGNOSIS — M5440 Lumbago with sciatica, unspecified side: Secondary | ICD-10-CM | POA: Insufficient documentation

## 2023-10-23 ENCOUNTER — Encounter: Payer: Self-pay | Admitting: Neurology

## 2023-10-31 ENCOUNTER — Other Ambulatory Visit: Payer: Self-pay | Admitting: Neurosurgery

## 2023-11-02 NOTE — Pre-Procedure Instructions (Signed)
 Surgical Instructions   Your procedure is scheduled on November 10, 2023. Report to [facility] Main Entrance A at 9:15 A.M., then check in with the Admitting office. Any questions or running late day of surgery: call [phone]  Questions prior to your surgery date: call [phone], Monday-Friday, 8am-4pm. If you experience any cold or flu symptoms such as cough, fever, chills, shortness of breath, etc. between now and your scheduled surgery, please notify us  at the above number.     Remember:  Do not eat after midnight the night before your surgery  You may drink clear liquids until 8:15 AM the morning of your surgery.   Clear liquids allowed are: Water , Non-Citrus Juices (without pulp), Carbonated Beverages, Clear Tea (no milk, honey, etc.), Black Coffee Only (NO MILK, CREAM OR POWDERED CREAMER of any kind), and Gatorade.    Take these medicines the morning of surgery with A SIP OF WATER : atorvastatin  (LIPITOR)  colchicine   DULoxetine  (CYMBALTA )  ezetimibe  (ZETIA )  lansoprazole (PREVACID)  levothyroxine  (SYNTHROID )  probenecid  (BENEMID )   May take these medicines IF NEEDED: acetaminophen  (TYLENOL )  clonazePAM  (KLONOPIN )  SYSTANE COMPLETE eye drops tiZANidine (ZANAFLEX)  traMADol (ULTRAM)    One week prior to surgery, STOP taking any Aspirin (unless otherwise instructed by your surgeon) Aleve, Naproxen, Ibuprofen, Motrin, Advil, Goody's, BC's, all herbal medications, fish oil, and non-prescription vitamins.                     Do NOT Smoke (Tobacco/Vaping) for 24 hours prior to your procedure.  If you use a CPAP at night, you may bring your mask/headgear for your overnight stay.   You will be asked to remove any contacts, glasses, piercing's, hearing aid's, dentures/partials prior to surgery. Please bring cases for these items if needed.    Patients discharged the day of surgery will not be allowed to drive home, and someone needs to stay with them for 24  hours.  SURGICAL WAITING ROOM VISITATION Patients may have no more than 2 support people in the waiting area - these visitors may rotate.   Pre-op nurse will coordinate an appropriate time for 1 ADULT support person, who may not rotate, to accompany patient in pre-op.  Children under the age of 84 must have an adult with them who is not the patient and must remain in the main waiting area with an adult.  If the patient needs to stay at the hospital during part of their recovery, the visitor guidelines for inpatient rooms apply.  Please refer to the [facility] website for the visitor guidelines for any additional information.   If you received a COVID test during your pre-op visit  it is requested that you wear a mask when out in public, stay away from anyone that may not be feeling well and notify your surgeon if you develop symptoms. If you have been in contact with anyone that has tested positive in the last 10 days please notify you surgeon.      Pre-operative 5 CHG Bathing Instructions   You can play a key role in reducing the risk of infection after surgery. Your skin needs to be as free of germs as possible. You can reduce the number of germs on your skin by washing with CHG (chlorhexidine  gluconate) soap before surgery. CHG is an antiseptic soap that kills germs and continues to kill germs even after washing.   DO NOT use if you have an allergy to chlorhexidine /CHG or antibacterial soaps. If your  skin becomes reddened or irritated, stop using the CHG and notify one of our RNs at [phone].   Please shower with the CHG soap starting 4 days before surgery using the following schedule:     Please keep in mind the following:  DO NOT shave, including legs and underarms, starting the day of your first shower.   You may shave your face at any point before/day of surgery.  Place clean sheets on your bed the day you start using CHG soap. Use a clean washcloth (not used since being  washed) for each shower. DO NOT sleep with pets once you start using the CHG.   CHG Shower Instructions:  Wash your face and private area with normal soap. If you choose to wash your hair, wash first with your normal shampoo.  After you use shampoo/soap, rinse your hair and body thoroughly to remove shampoo/soap residue.  Turn the water  OFF and apply about 3 tablespoons (45 ml) of CHG soap to a CLEAN washcloth.  Apply CHG soap ONLY FROM YOUR NECK DOWN TO YOUR TOES (washing for 3-5 minutes)  DO NOT use CHG soap on face, private areas, open wounds, or sores.  Pay special attention to the area where your surgery is being performed.  If you are having back surgery, having someone wash your back for you may be helpful. Wait 2 minutes after CHG soap is applied, then you may rinse off the CHG soap.  Pat dry with a clean towel  Put on clean clothes/pajamas   If you choose to wear lotion, please use ONLY the CHG-compatible lotions that are listed below.  Additional instructions for the day of surgery: DO NOT APPLY any lotions, deodorants, cologne, or perfumes.   Do not bring valuables to the hospital. [facility] is not responsible for any belongings/valuables. Do not wear nail polish, gel polish, artificial nails, or any other type of covering on natural nails (fingers and toes) Do not wear jewelry or makeup Put on clean/comfortable clothes.  Please brush your teeth.  Ask your nurse before applying any prescription medications to the skin.     CHG Compatible Lotions   Aveeno Moisturizing lotion  Cetaphil Moisturizing Cream  Cetaphil Moisturizing Lotion  Clairol Herbal Essence Moisturizing Lotion, Dry Skin  Clairol Herbal Essence Moisturizing Lotion, Extra Dry Skin  Clairol Herbal Essence Moisturizing Lotion, Normal Skin  Curel Age Defying Therapeutic Moisturizing Lotion with Alpha Hydroxy  Curel Extreme Care Body Lotion  Curel Soothing Hands Moisturizing Hand Lotion  Curel Therapeutic  Moisturizing Cream, Fragrance-Free  Curel Therapeutic Moisturizing Lotion, Fragrance-Free  Curel Therapeutic Moisturizing Lotion, Original Formula  Eucerin Daily Replenishing Lotion  Eucerin Dry Skin Therapy Plus Alpha Hydroxy Crme  Eucerin Dry Skin Therapy Plus Alpha Hydroxy Lotion  Eucerin Original Crme  Eucerin Original Lotion  Eucerin Plus Crme Eucerin Plus Lotion  Eucerin TriLipid Replenishing Lotion  Keri Anti-Bacterial Hand Lotion  Keri Deep Conditioning Original Lotion Dry Skin Formula Softly Scented  Keri Deep Conditioning Original Lotion, Fragrance Free Sensitive Skin Formula  Keri Lotion Fast Absorbing Fragrance Free Sensitive Skin Formula  Keri Lotion Fast Absorbing Softly Scented Dry Skin Formula  Keri Original Lotion  Keri Skin Renewal Lotion Keri Silky Smooth Lotion  Keri Silky Smooth Sensitive Skin Lotion  Nivea Body Creamy Conditioning Oil  Nivea Body Extra Enriched Teacher, adult education Moisturizing Lotion Nivea Crme  Nivea Skin Firming Lotion  NutraDerm 30 Skin Lotion  NutraDerm Skin Lotion  NutraDerm Therapeutic  Skin Cream  NutraDerm Therapeutic Skin Lotion  ProShield Protective Hand Cream  Provon moisturizing lotion  Please read over the following fact sheets that you were given.

## 2023-11-03 ENCOUNTER — Encounter (HOSPITAL_COMMUNITY)
Admission: RE | Admit: 2023-11-03 | Discharge: 2023-11-03 | Disposition: A | Discharge status: Home / Self Care | Source: Ambulatory Visit | Attending: Neurosurgery | Admitting: Neurosurgery

## 2023-11-03 ENCOUNTER — Other Ambulatory Visit: Payer: Self-pay

## 2023-11-03 ENCOUNTER — Encounter (HOSPITAL_COMMUNITY): Payer: Self-pay

## 2023-11-03 VITALS — BP 134/85 | HR 110 | Temp 98.2°F | Resp 18 | Ht 68.0 in | Wt 207.0 lb

## 2023-11-03 DIAGNOSIS — D631 Anemia in chronic kidney disease: Secondary | ICD-10-CM | POA: Diagnosis not present

## 2023-11-03 DIAGNOSIS — I2510 Atherosclerotic heart disease of native coronary artery without angina pectoris: Secondary | ICD-10-CM | POA: Insufficient documentation

## 2023-11-03 DIAGNOSIS — Z8551 Personal history of malignant neoplasm of bladder: Secondary | ICD-10-CM | POA: Insufficient documentation

## 2023-11-03 DIAGNOSIS — I129 Hypertensive chronic kidney disease with stage 1 through stage 4 chronic kidney disease, or unspecified chronic kidney disease: Secondary | ICD-10-CM | POA: Insufficient documentation

## 2023-11-03 DIAGNOSIS — K219 Gastro-esophageal reflux disease without esophagitis: Secondary | ICD-10-CM | POA: Diagnosis not present

## 2023-11-03 DIAGNOSIS — M5416 Radiculopathy, lumbar region: Secondary | ICD-10-CM | POA: Diagnosis not present

## 2023-11-03 DIAGNOSIS — M109 Gout, unspecified: Secondary | ICD-10-CM | POA: Diagnosis not present

## 2023-11-03 DIAGNOSIS — Z01818 Encounter for other preprocedural examination: Secondary | ICD-10-CM | POA: Diagnosis present

## 2023-11-03 DIAGNOSIS — Z01812 Encounter for preprocedural laboratory examination: Secondary | ICD-10-CM | POA: Diagnosis not present

## 2023-11-03 DIAGNOSIS — N183 Chronic kidney disease, stage 3 unspecified: Secondary | ICD-10-CM | POA: Diagnosis not present

## 2023-11-03 DIAGNOSIS — E039 Hypothyroidism, unspecified: Secondary | ICD-10-CM | POA: Insufficient documentation

## 2023-11-03 HISTORY — DX: Unspecified asthma, uncomplicated: J45.909

## 2023-11-03 HISTORY — DX: Umbilical hernia without obstruction or gangrene: K42.9

## 2023-11-03 HISTORY — DX: Hypothyroidism, unspecified: E03.9

## 2023-11-03 LAB — SURGICAL PCR SCREEN
MRSA, PCR: NEGATIVE
Staphylococcus aureus: NEGATIVE

## 2023-11-03 NOTE — Progress Notes (Signed)
 Pt has received blood transfusion within the last three months, therefore type and screen not completed at PAT appointment. Will need to be collected DOS.

## 2023-11-03 NOTE — Progress Notes (Signed)
 PCP - Dr. Oneil Neth Cardiologist - Pt saw Dr. Dann in 2023 for CAD with one year follow-up. Pt went back to cardiology 08/31/2022 for surgical clearance and has not been back since.  PPM/ICD - Denies Device Orders - n/a Rep Notified - n/a  Chest x-ray - n/a EKG - 08/09/2023 Stress Test - Per pt, had stress test 10+ years ago - normal result ECHO - Denies Cardiac Cath - Denies  Sleep Study - Denies CPAP - n/a  No DM  Last dose of GLP1 agonist- n/a GLP1 instructions: n/a  Blood Thinner Instructions: n/a Aspirin Instructions: n/a  ERAS Protcol - Clear liquids until 0815 morning of surgery PRE-SURGERY Ensure or G2- n/a  COVID TEST- n/a   Anesthesia review: Yes. Hx of CAD, HTN, CKD3, COPD (denied by patient). Pt was assessed by Allison Zelenak, PA-C for intermittent wheezing noted by his home nurse. Pt states he does have wheezing but it is not as bad or as frequent as nurse states. He uses an OTC inhaler Primatene  for any respiratory issues.   Patient denies shortness of breath, fever, cough and chest pain at PAT appointment. Pt denies any respiratory illness/infection in the last two months.   All instructions explained to the patient, with a verbal understanding of the material. Patient agrees to go over the instructions while at home for a better understanding. Patient also instructed to self quarantine after being tested for COVID-19. The opportunity to ask questions was provided.

## 2023-11-03 NOTE — Progress Notes (Unsigned)
 Assessment/Plan:  1.  Gait instability  - At this point in time, patient does not meet criteria for idiopathic Parkinsons disease.  He and I discussed this today.  He does drag the left leg, but is getting ready to have back surgery later this week.  He does have severe spinal stenosis at the L4-L5 region with severe right and moderate left neuroforaminal stenosis, which certainly could explain some of his findings.  I think we really need to wait and see how he does after that surgery.  - They did describe some shuffling.  I really did not see much of that today, although he did have somewhat of a shortened gait the longer that he walked.  I told them we could go ahead and do a DaTscan, although even if it showed dopamine loss, he does not have or meet criteria for Parkinsons disease.  We will schedule that after his surgery.  2.  Hyperreflexia, including in the upper extremities  - We will proceed with MRI cervical spine given his history of falls and gait instability.  3.  Blepharospasm  - Not bothersome to patient.  No medication is necessary.  If becomes bothersome or worse (very mild) then we could certainly consider Botox in the future.   4.  History of Wernicke's encephalopathy  - Patient reports today that he is still drinking some alcohol , but not like he was.  Given gait instability, complete cessation would be of value  - I do think that it is possible that he has peripheral neuropathy, based on physical examination today.  This could be due to his history of alcohol  use, even if he is not drinking much today. Subjective:   Miguel Williams was seen today in the movement disorders clinic for neurologic consultation at the request of Shayne Anes, MD.  The consultation is for the evaluation of Parkinsons.  Patient accompanied by his nurse [name] who supplements the history.  Patient has apparently been in physical therapy and his therapist thought he had features of parkinsonism.  He  was referred here for further evaluation.  I do not have his physical therapy notes.  I do see that he is getting ready to undergo back surgery later this week.   Specific Symptoms:  Tremor: when he gets mad and maybe with intense therapy he would have some tremor in the R leg Family hx of similar:  No. Voice: no change Sleep:   Vivid Dreams:  yes  Acting out dreams:  No. Wet Pillows: No. Postural symptoms:  Yes.  , he attributes it to his back  Falls?  Yes.  , had a big fall in April, 2025 - in [place] fell on his back and thinks he passed out.  This was the last fall; prior fall was just prior to christmas in [place] and he wasn't paying attention and he hit a curb and fell.  Nurse states that even in the home the balance isn't good.   Bradykinesia symptoms: shuffling gait, difficulty getting out of a chair, and difficulty regaining balance Loss of smell:  No. Loss of taste:  No. Urinary Incontinence:  not generally Difficulty Swallowing:  No. Handwriting, micrographia: Yes.   Trouble with ADL's:  No.  Trouble buttoning clothing: No. Depression:  No. But admits to some anxiety around upcoming surgery Memory changes:  No. Hallucinations:  No.  visual distortions: No. N/V:  No. Lightheaded:  not since bladder CA dx (07/2022)  Syncope: only one time, above 07/2023  Diplopia:  No. Prior exposure to reglan/antipsychotics: No.  Patient had MRI of the brain August 10, 2023 that demonstrated white matter disease.  It was otherwise unremarkable.  ALLERGIES:   Allergies  Allergen Reactions   Allopurinol Other (See Comments)    Had a bad reaction   Bacitracin-Polymyxin B Hives   Baclofen Other (See Comments)    Hallucinations- required admit to hospital for adverse drug rxn.   Neomycin Other (See Comments)    Allergic skin reaction   Tegretol  [Carbamazepine ] Hives   Sulfa Antibiotics Hives and Rash    CURRENT MEDICATIONS:  Current Meds  Medication Sig   acetaminophen  (TYLENOL ) 500 MG  tablet Take 1 tablet (500 mg total) by mouth every 6 (six) hours as needed for mild pain, moderate pain or fever.   atorvastatin  (LIPITOR) 40 MG tablet Take 40 mg by mouth in the morning.   betamethasone dipropionate 0.05 % cream Apply 1 Application topically 2 (two) times daily as needed (skin irritation.).   calcium -vitamin D (OSCAL WITH D) 500-5 MG-MCG tablet Take 1 tablet by mouth.   CLOBETASOL PROPIONATE E 0.05 % emollient cream Apply 1 Application topically daily as needed (irritation).   clonazePAM  (KLONOPIN ) 0.5 MG tablet Take 0.5 mg by mouth at bedtime as needed (sleep/anxiety).   colchicine  0.6 MG tablet Take 0.6 mg by mouth in the morning.   cyanocobalamin  (VITAMIN B12) 500 MCG tablet Take 500 mcg by mouth daily.   DULoxetine  (CYMBALTA ) 60 MG capsule Take 60 mg by mouth in the morning.   dutasteride  (AVODART ) 0.5 MG capsule Take 1 capsule (0.5 mg total) by mouth every other day. (Patient taking differently: Take 0.5 mg by mouth every evening.)   ezetimibe  (ZETIA ) 10 MG tablet Take 10 mg by mouth in the morning.   ferrous sulfate  325 (65 FE) MG EC tablet Take 325 mg by mouth 3 (three) times daily with meals.   lansoprazole (PREVACID) 30 MG capsule Take 30 mg by mouth in the morning and at bedtime.   levothyroxine  (SYNTHROID ) 50 MCG tablet Take 50-100 mcg by mouth See admin instructions. Take 2 tablets (100 mcg) by mouth in the morning on Sundays.  Take 1 tablet (50 mcg) by mouth in the morning on Mondays, Tuesdays, Wednesdays, Thursdays, Fridays & Saturdays.   loperamide (IMODIUM A-D) 2 MG tablet Take 2 mg by mouth 4 (four) times daily as needed for diarrhea or loose stools.   probenecid  (BENEMID ) 500 MG tablet Take 1 tablet (500 mg total) by mouth daily.   SYSTANE COMPLETE PF 0.6 % SOLN Place 1 drop into both eyes 4 (four) times daily as needed (for dryness).   Tamsulosin  HCl (FLOMAX ) 0.4 MG CAPS TAKE 1 CAPSULE (0.4 MG TOTAL) BY MOUTH DAILY.   thiamine  (VITAMIN B-1) 100 MG tablet Take 1  tablet (100 mg total) by mouth daily.   tiZANidine (ZANAFLEX) 2 MG tablet Take 2 mg by mouth every 6 (six) hours as needed for muscle spasms.   traMADol (ULTRAM) 50 MG tablet Take 50 mg by mouth every 6 (six) hours as needed (pain.).   valsartan (DIOVAN) 80 MG tablet Take 80 mg by mouth at bedtime.     Objective:   VITALS:   Vitals:   11/06/23 1301  BP: 126/72  Pulse: (!) 112  SpO2: 95%  Height: 5' 8 (1.727 m)    GEN:  The patient appears stated age and is in NAD. HEENT:  Normocephalic, atraumatic.  The mucous membranes are moist. The superficial temporal arteries  are without ropiness or tenderness. CV:  tachycardic.  Regular. Lungs:  CTAB Neck/HEME:  There are no carotid bruits bilaterally.  Neurological examination:  Orientation: The patient is alert and oriented x3.  Cranial nerves: There is good facial symmetry.  Mild blepharospasm is noted.  Extraocular muscles are intact. The visual fields are full to confrontational testing. The speech is fluent and clear. Soft palate rises symmetrically and there is no tongue deviation. Hearing is intact to conversational tone. Sensation: Sensation is intact to light and pinprick throughout (facial, trunk, extremities). Vibration is decreased distally. There is no extinction with double simultaneous stimulation. There is no sensory dermatomal level identified. Motor: Strength is 5/5 in the bilateral upper and lower extremities.   Shoulder shrug is equal and symmetric.  There is no pronator drift. Deep tendon reflexes: Deep tendon reflexes are 2+-3/4 at the bilateral biceps, triceps, brachioradialis, patella and achilles. Plantar responses are downgoing bilaterally.  Movement examination: Tone: There is tone in the bilateral upper extremities.  The tone in the lower extremities is good.  Abnormal movements: none even with distraction Coordination:  There is  decremation with RAM's, only with finger taps on the right  Gait and Station: The  patient has no difficulty arising out of a deep-seated chair without the use of the hands. The patient's stride length is initially good with decreased arm swing on the L.  The farther he walks the more short stepped but no shuffle and slight drag of the L leg.    I have reviewed and interpreted the following labs independently   Chemistry      Component Value Date/Time   NA 139 08/11/2023 0536   K 4.0 08/11/2023 0536   CL 106 08/11/2023 0536   CO2 24 08/11/2023 0536   BUN 24 (H) 08/11/2023 0536   CREATININE 1.18 08/11/2023 0536   CREATININE 1.17 05/16/2012 1021      Component Value Date/Time   CALCIUM  8.8 (L) 08/11/2023 0536   ALKPHOS 66 08/11/2023 0536   AST 19 08/11/2023 0536   ALT 23 08/11/2023 0536   BILITOT 1.3 (H) 08/11/2023 0536      No results found for: TSH Lab Results  Component Value Date   WBC 7.6 08/11/2023   HGB 9.3 (L) 08/11/2023   HCT 28.7 (L) 08/11/2023   MCV 95.7 08/11/2023   PLT 318 08/11/2023     Total time spent on today's visit was 60 minutes, including both face-to-face time and nonface-to-face time.  Time included that spent on review of records (prior notes available to me/labs/imaging if pertinent), discussing treatment and goals, answering patient's questions and coordinating care.  Cc:  Shayne Anes, MD

## 2023-11-03 NOTE — Anesthesia Preprocedure Evaluation (Addendum)
 Anesthesia Evaluation  Patient identified by MRN, date of birth, ID band Patient awake    Reviewed: Allergy & Precautions, NPO status , Patient's Chart, lab work & pertinent test results  History of Anesthesia Complications (+) PONV and history of anesthetic complications  Airway Mallampati: III  TM Distance: >3 FB Neck ROM: Full    Dental  (+) Teeth Intact, Dental Advisory Given, Caps   Pulmonary asthma , COPD   Pulmonary exam normal breath sounds clear to auscultation       Cardiovascular hypertension, Pt. on medications + CAD  Normal cardiovascular exam Rhythm:Regular Rate:Normal     Neuro/Psych  Headaches PSYCHIATRIC DISORDERS Anxiety  Bipolar Disorder    Neuromuscular disease    GI/Hepatic ,GERD  Medicated,,(+)     substance abuse  alcohol  use  Endo/Other  Hypothyroidism  Obesity   Renal/GU Renal InsufficiencyRenal disease     Musculoskeletal  (+) Arthritis ,    Abdominal   Peds  Hematology negative hematology ROS (+)   Anesthesia Other Findings   Reproductive/Obstetrics                              Anesthesia Physical Anesthesia Plan  ASA: 3  Anesthesia Plan: General   Post-op Pain Management: Tylenol  PO (pre-op)*   Induction: Intravenous  PONV Risk Score and Plan: 3 and Dexamethasone , Ondansetron  and Propofol  infusion  Airway Management Planned: Oral ETT  Additional Equipment: ClearSight  Intra-op Plan:   Post-operative Plan: Extubation in OR  Informed Consent: I have reviewed the patients History and Physical, chart, labs and discussed the procedure including the risks, benefits and alternatives for the proposed anesthesia with the patient or authorized representative who has indicated his/her understanding and acceptance.     Dental advisory given  Plan Discussed with: CRNA  Anesthesia Plan Comments: (PAT note written 11/03/2023 by Isaiah Ruder,  PA-C.  2nd PIV after induction )         Anesthesia Quick Evaluation

## 2023-11-03 NOTE — Progress Notes (Signed)
 Anesthesia APP PAT Evaluation:   Case: [id number] Date/Time: 11/10/23 1102   Procedure: POSTERIOR LUMBAR FUSION 2 LEVEL (Back) - PLIF - L4-L5 - L5-S1 - Posterior Lateral and Interbody fusion   Anesthesia type: General   Diagnosis: Lumbar radiculopathy [M54.16]   Pre-op diagnosis: Lumbar radiculopathy   [facility] / [facility]   Surgeons: [name], MD       DISCUSSION: Patient is a 78 year old male scheduled for the above procedure.  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  Current alcohol  use is limited to 3 oz. of wine every other day.  [facility] admission 08/09/23 - 08/11/23 for anemia. He had vacationed in [place]  and fell, landing on his thigh and developed a significant hematoma. He was feeling lightheaded and fatigued, so his PCP checked a CBC and was sent to ED for HGB in the 7 range. He received 2 units PRBC with improvement. Acute drop in H/H attibuated to bleeding into his leg after his fall. He was not on any anticoagulation and anti-platelet therapy. PT/PTT and PLT counts were normal.   I evaluated during PAT visit because home nurse reported noticing occasional intermittent wheezing when he is in certain positions, typically while dozing off. He He denied having prior sleep study. He does not otherwise feel like he is wheezing. He had an inhaler once before going a a cruise, and seemed to be more as a precaution. He has Primatene  Mist to uses as needed, but has not needed. He SOB or cough. No chest pain. His walking has become significantly limited within the past year due to his back issues but otherwise had been able to walk several miles before then. No wheezing noted on exam. No coughing, congestions, hoarseness  or conversational dyspnea noted.   He had previous cardiology evaluation at [facility] by Dann Beverage, MD on 01/07/22 for finding of coronary artery calcifications on prior imaging (report not available). Since he was active without CV symptoms, risk factor modification and medication therapy pursued over stress testing. PCP had already increased atorvastatin  and added Zetia . Last visit 08/31/22 with Loistine Sober, NP for preoperative cardiology before urologic surgery. He was asymptomatic and walking > 1 miles daily. No additional testing ordered at that time. At PAT, he notes he continues to be asymptomatic. He was still active until more acute worsening of his back pain.   He was referred to neurology after PT suggested evaluation for Parkinson's disease or movement disorder.   No acute cardiopulmonary symptoms noted at his PAT visit. No wheezing on exam. If no acute changes then I would anticipate that he can proceed as planned. Will plan to review neurology note if available prior to surgery. (UPDATE 11/06/23 6:26 PM: Reviewed Dr. Collie neurology note.  He did not criteria for idiopathic Parkinson's disease, and spinal stenosis may explain some of his symptoms. Would re-evaluate to see how he does after surgery, and also could schedule a DaTscan for after surgery. MRI C-spine also planned to evaluate for hyperreflexia and given his history of falls and gait instability. Peripheral neuropathy also a possibility based on her findings and could be due to his history of alcohol  use, although reported he was not currently drinking very much alcohol . Currently, she is scheduled to see him in ~ 6 months on  05/23/24.)   VS: BP 134/85   Pulse (!) 110   Temp 36.8 C   Resp 18   Ht 5' 8 (1.727 m)   Wt 93.9 kg   SpO2 96%   BMI 31.47 kg/m   Provider wore face mask. Dr. Wallene is a very pleasant male, and in NAD. Heart RRR, no murmur noted. Lung clear. Denied ankle edema (reported left  thigh/knee edema from ~April's fall had improved). 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 He also has a home in [place]  that he normally visits in the summer but delayed his trip due to need for surgery.    PROVIDERSBETHA Shayne Anes, MD is PCP. 10/23/23 office note is scanned under Media tab.  GLEN[place]OD Devere Bruckner, MD is urologist - [facility]  - Strazanac, Alyssa, MD is rheumatologist - Previous cardiology evaluation by Dann Beverage, MD in 2023 (see DISCUSSION) - He has upcoming neurology with with Tat, Asberry, DO on 11/06/23 - He reported prior evaluation at [facility] ~ 2024 and was told he could continue follow-up with PCP unless worsening renal function.    LABS: Labs from 10/23/23 visit with Dr. Shayne include (see Care Everywhere; copy also on shadow chart).  GLUCOSE 121 H 60 - 110 - mg/dl        TOTAL BILIRUBIN 0.4   0.0 - 1.5 - mg/dl        CREATININE 1.5 H 0.6 - 1.3 - mg/dl        eGFR Non-African American 45.3   < -        SODIUM 141   135 - 148 - mEq/L        POTASSIUM 4.8   3.5 - 5.3 - mEq/L        CHLORIDE 109   80 - 111 - mEq/L        CO2 23   15 - 35 - mEq/L        CALCIUM  9.7   7.0 - 10.5 - mg/dL        TOTAL PROTEIN 6.8   6.0 - 8.5 - g/dL        ALBUMIN 4.5   2.0 - 5.5 - g/dL        AST 16   7 - 45 - IU/L        ALT 23   5 - 40 - IU/L        ALK PHOS 59   37 - 137 - IU/L        BUN 26 H 5 - 23 - mg/dl   WBC 4.59   4.10 - 10.90 - K/uL        HGB 14.9   12.0 - 18.0 - g/dL        HCT 55.4   62.9 - 51.0 - %        PLT 219   140 - 440 - K/uL    Iron Bind.Cap.(TIBC) 283 250-450 ug/dL    UIBC 847 888-656 ug/dL    Iron 868 61-830 ug/dL    Iron Saturation 46 84-44 %      IMAGES: MRI L-spine 09/14/23:  IMPRESSION: 1. Advanced lumbar disc and facet degeneration with severe spinal stenosis and moderate right neural foraminal stenosis at L4-5. 2. Mild spinal stenosis and moderate bilateral neural  foraminal stenosis at L5-S1. 3. Endplate edema at O5-4 with an associated Schmorl's node involving the L5 superior endplate which could be a source of back pain.  4. Possible small subcutaneous hematoma and area of fat necrosis in the midline at L4.  MRI Brain 08/10/23: IMPRESSION: No evidence of acute intracranial abnormality.   CT LLE 08/10/23: IMPRESSION: No acute bony abnormality noted. Diffuse subcutaneous edema without focal hematoma.   EKG: EKG 08/09/23: Sinus rhythm at 96 bpm Right bundle branch block No significant change since last tracing Confirmed by Zackowski, Scott [phone]) on 08/09/2023 6:26:04 PM   CV: LE Venous US  08/10/23: Summary:  RIGHT:  - No evidence of common femoral vein obstruction.  LEFT:  - There is no evidence of deep vein thrombosis in the lower extremity.  - No cystic structure found in the popliteal fossa.    Past Medical History:  Diagnosis Date   Abdominal pain, lower    Agitation    Allergy, unspecified, initial encounter    Anxiety disorder    Arthritis    Asthma    As a child   Bipolar disorder [facility])    Patient denies   Cancer [facility])    Bladder   Cancer (HCC)    Skin cancer   Chronic kidney disease, stage III (moderate) (HCC)    Chronic kidney disease, stage III (moderate) (HCC)    COPD (chronic obstructive pulmonary disease) (HCC)    Pt denies (10/2023)   Coronary atherosclerosis of native coronary artery    Esophageal reflux    Fatigue    Fungal granuloma    Hip pain    History of 2019 novel coronavirus disease (COVID-19)    History of endoscopy 02/00/2011   History of kidney stones    Hypertension    Hypo-osmolality and hyponatremia    Hypokalemia    Hypothyroidism    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 now has 3oz of wine every other day (as of 10/2023)   Umbilical hernia    Unspecified fall, subsequent encounter    Vitamin D deficiency, unspecified    Wernicke's encephalopathy 06/23/2019    Past Surgical History:  Procedure Laterality Date   CATARACT EXTRACTION W/ INTRAOCULAR LENS IMPLANT Bilateral    COLONOSCOPY  04/2023   COLONOSCOPY W/ ENDOSCOPIC US      CYSTOSCOPY WITH FULGERATION N/A 03/09/2023   Procedure: CYSTOSCOPY WITH FULGERATION WITH CLOT EVACUATION;  Surgeon: Devere Lonni Righter, MD;  Location: [facility];  Service: Urology;  Laterality: N/A;   CYSTOSCOPY WITH URETHRAL DILATATION N/A 03/08/2023   Procedure: CYSTOSCOPY;  Surgeon: Devere Lonni Righter, MD;  Location: [facility];  Service: Urology;  Laterality: N/A;  30 MINUTES   ESOPHAGOGASTRODUODENOSCOPY     TONSILLECTOMY  04/25/1951    MEDICATIONS:  acetaminophen  (TYLENOL ) 500 MG tablet   atorvastatin  (LIPITOR) 40 MG tablet   betamethasone dipropionate 0.05 % cream   CLOBETASOL PROPIONATE E 0.05 % emollient cream   clonazePAM  (KLONOPIN ) 0.5 MG tablet   colchicine  0.6 MG tablet   DULoxetine  (CYMBALTA ) 60 MG capsule   dutasteride  (AVODART )  0.5 MG capsule   ezetimibe  (ZETIA ) 10 MG tablet   lansoprazole (PREVACID) 30 MG capsule   levothyroxine  (SYNTHROID ) 50 MCG tablet   loperamide (IMODIUM A-D) 2 MG tablet   probenecid  (BENEMID ) 500 MG tablet   SYSTANE COMPLETE PF 0.6 % SOLN   Tamsulosin  HCl (FLOMAX ) 0.4 MG CAPS   thiamine  (VITAMIN B-1) 100 MG tablet   tiZANidine (ZANAFLEX) 2 MG tablet   traMADol (ULTRAM) 50 MG tablet   valsartan (DIOVAN) 80 MG tablet   No current facility-administered medications for this encounter.     Isaiah Ruder, PA-C Surgical Short Stay/Anesthesiology [facility] Phone [phone] [facility] Phone [phone] 11/03/2023 7:14 PM

## 2023-11-06 ENCOUNTER — Encounter: Payer: Self-pay | Admitting: Neurology

## 2023-11-06 ENCOUNTER — Other Ambulatory Visit: Payer: Self-pay

## 2023-11-06 ENCOUNTER — Ambulatory Visit: Admitting: Neurology

## 2023-11-06 VITALS — BP 126/72 | HR 112 | Ht 68.0 in

## 2023-11-06 DIAGNOSIS — R2681 Unsteadiness on feet: Secondary | ICD-10-CM

## 2023-11-06 DIAGNOSIS — I10 Essential (primary) hypertension: Secondary | ICD-10-CM | POA: Insufficient documentation

## 2023-11-06 DIAGNOSIS — M48062 Spinal stenosis, lumbar region with neurogenic claudication: Secondary | ICD-10-CM | POA: Diagnosis not present

## 2023-11-06 DIAGNOSIS — Z8719 Personal history of other diseases of the digestive system: Secondary | ICD-10-CM | POA: Insufficient documentation

## 2023-11-06 DIAGNOSIS — R292 Abnormal reflex: Secondary | ICD-10-CM | POA: Diagnosis not present

## 2023-11-06 DIAGNOSIS — M5412 Radiculopathy, cervical region: Secondary | ICD-10-CM

## 2023-11-06 DIAGNOSIS — M5416 Radiculopathy, lumbar region: Secondary | ICD-10-CM | POA: Insufficient documentation

## 2023-11-06 DIAGNOSIS — Z811 Family history of alcohol abuse and dependence: Secondary | ICD-10-CM | POA: Insufficient documentation

## 2023-11-06 DIAGNOSIS — M2540 Effusion, unspecified joint: Secondary | ICD-10-CM | POA: Insufficient documentation

## 2023-11-06 DIAGNOSIS — K219 Gastro-esophageal reflux disease without esophagitis: Secondary | ICD-10-CM | POA: Insufficient documentation

## 2023-11-06 DIAGNOSIS — E213 Hyperparathyroidism, unspecified: Secondary | ICD-10-CM | POA: Insufficient documentation

## 2023-11-06 DIAGNOSIS — R519 Headache, unspecified: Secondary | ICD-10-CM | POA: Insufficient documentation

## 2023-11-06 DIAGNOSIS — M2560 Stiffness of unspecified joint, not elsewhere classified: Secondary | ICD-10-CM | POA: Insufficient documentation

## 2023-11-06 DIAGNOSIS — G245 Blepharospasm: Secondary | ICD-10-CM

## 2023-11-06 DIAGNOSIS — D500 Iron deficiency anemia secondary to blood loss (chronic): Secondary | ICD-10-CM | POA: Insufficient documentation

## 2023-11-06 DIAGNOSIS — R413 Other amnesia: Secondary | ICD-10-CM | POA: Insufficient documentation

## 2023-11-06 NOTE — Patient Instructions (Addendum)
 As we discussed, we are going to do a DaT scan.  We discussed that this is not a diagnostic scan, but will just give us  some information on dopamine levels in the brain.  Here is some information which may be helpful to you.  Before the Exam  Please tell the nurse, nuclear imaging technician or nuclear medicine physician if you are pregnant, nursing or have reduced liver function. Please also inform us  if you have an allergy or sensitivity to iodine.  The test may be completed with those who are allergic to iodine, but may require pre-medication with other medications to help avoid reactions. If you need to cancel the examination, please give us  at least 24 hours notice.  Before your scan, stop taking these medicines for the length of time shown: Name of Drug Stop Taking  Amoxapine 4 days before  Benztropine  Cogentin 3 days before  Bupropion (Aplenzin, Budeprion, Voxra, Wellbutrin, Zyban) 48 hours before  Buspirone 15 hours before  Citalopram 24 hours before  Duloxetine  24-48 hours before  Escitalopram 24 hours before  Methamphetamine 24 hours before  Methylphenidate (Concerta, Metadate, Methylin, Ritalin) 20 hours before  Paroxetine 24 hours before  Selegilene 48 hours before  Sertraline 3 days before    On the Day of the Exam Drink plenty of fluids and go to the bathroom frequently (and for two days after your exam) Wear loose comfortable clothing, since you will need to lie still for a period of time. Please bring a list of all medications that you are taking; name and dosage. We want to make your waiting time as pleasant as possible. Consider bringing your favorite magazine, book or music player to help you pass the time.  You do not need to stay at the imaging facility the entire time, between the initial injection and the scan itself.   Please leave your jewelry and valuables at home.  During the Exam The DaTscan once started takes approximately 30-45 minutes. However,  following injection of the DaT agent approximately 3-6 hours are required before the agent has achieved appropriate concentration in the brain.  We will inject the DaTscan through an intravenous (IV) line into your arm in the AM, usually around 8-9am, and then you will come back usually in the mid afternoon for the scan. Before the exam, you will receive a drug to allow you to protect the thyroid . For the imaging test, you will be asked to lie on a table and an imaging technologist will position your head in a headrest. A strip of tape or a flexible restraint may be placed around your head to help you to not move your head during the scan. A camera will be positioned above you and you must remain very still for about 30 minute while images are taken. The scanner will be very close to your head, but will not touch your head.

## 2023-11-07 ENCOUNTER — Other Ambulatory Visit: Payer: Self-pay

## 2023-11-07 DIAGNOSIS — R251 Tremor, unspecified: Secondary | ICD-10-CM

## 2023-11-11 IMAGING — CT CT HEAD W/O CM
3 series · 14 of 47 positions shown, 16 images · non-contrast
Comparison: None.

CLINICAL DATA: Trauma.



[Series 3: head wo · axial · 0.48mm/px · z∈[-145,-5]mm · 8 of 34 slices shown, 10 images]
[im 3/34  brain]
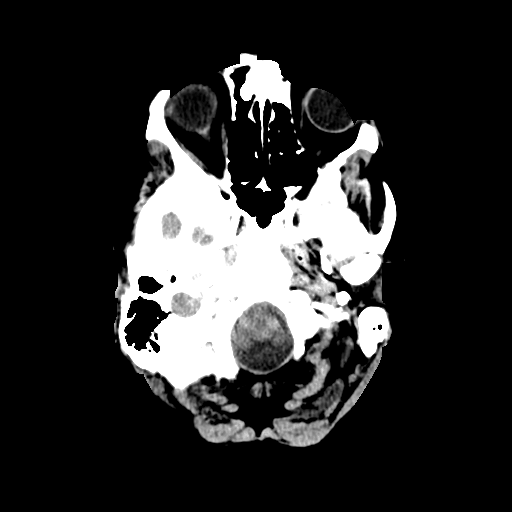
[im 3/34  bone]
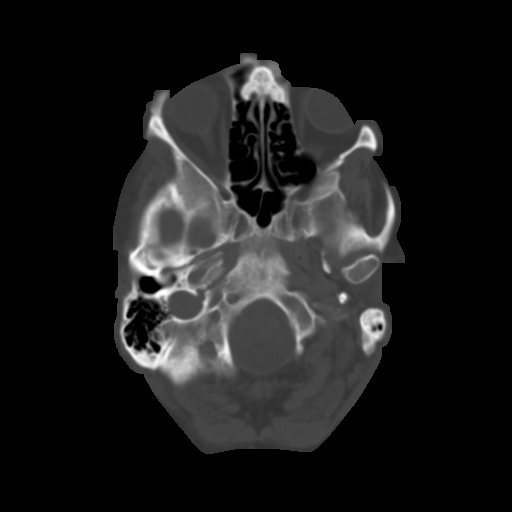
[im 7/34  brain]
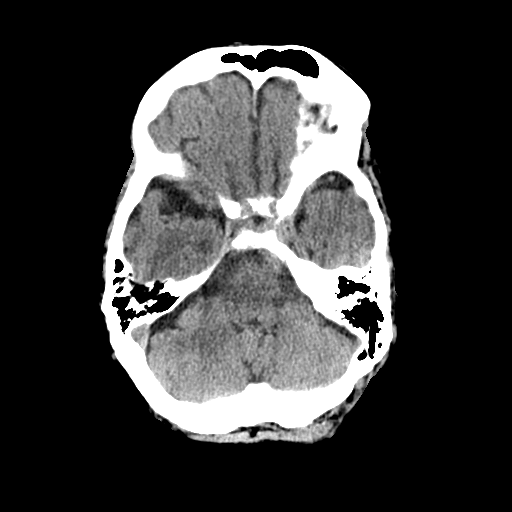
[im 11/34  brain]
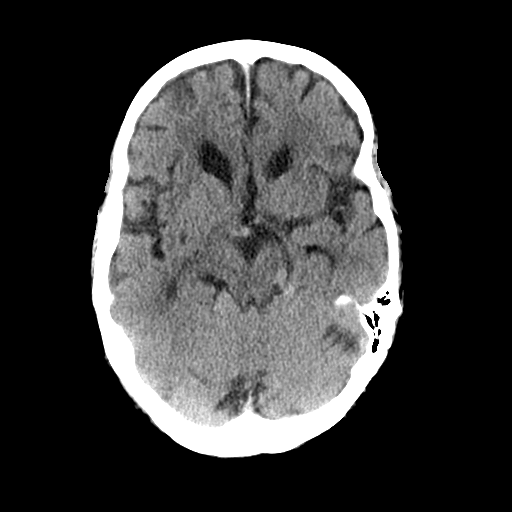
[im 15/34  brain]
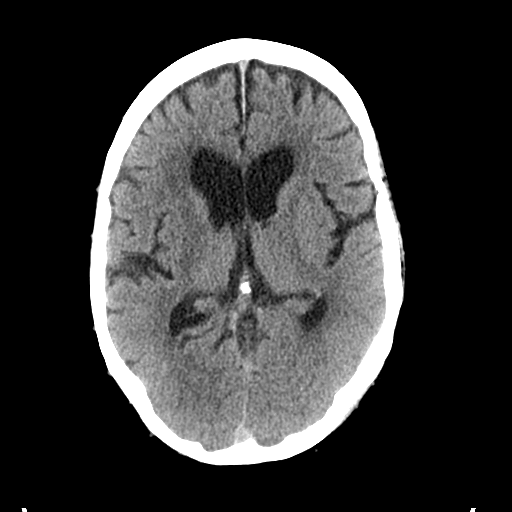
[im 19/34  brain]
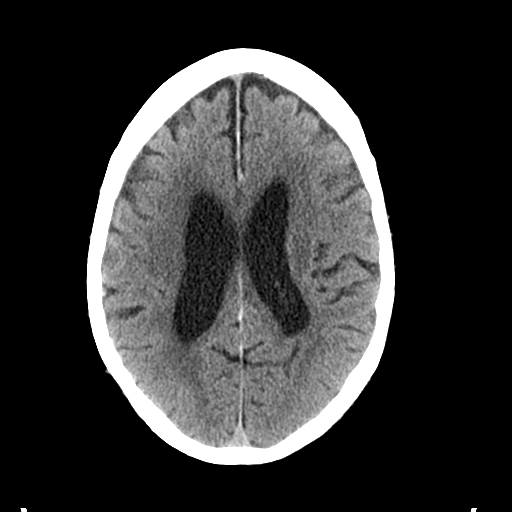
[im 19/34  bone]
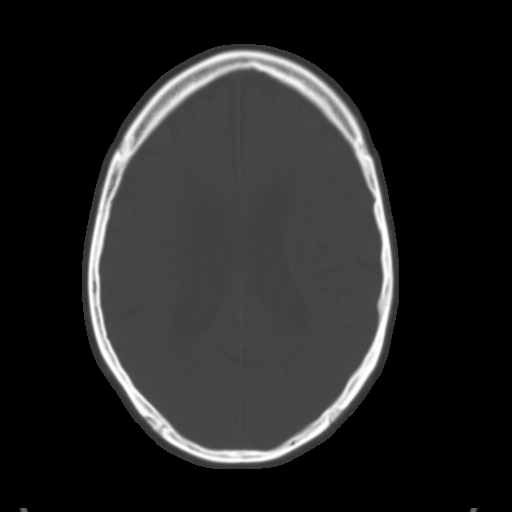
[im 23/34  brain]
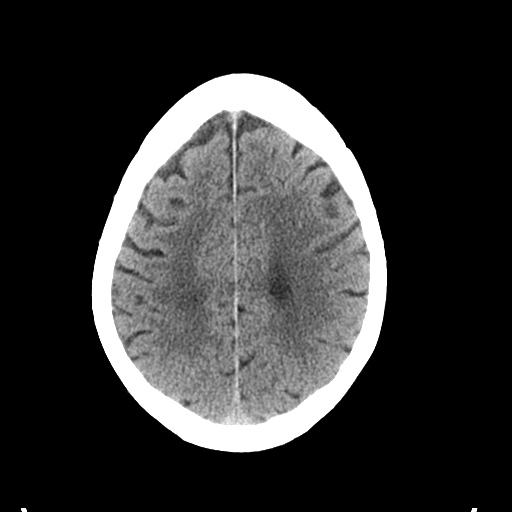
[im 27/34  brain]
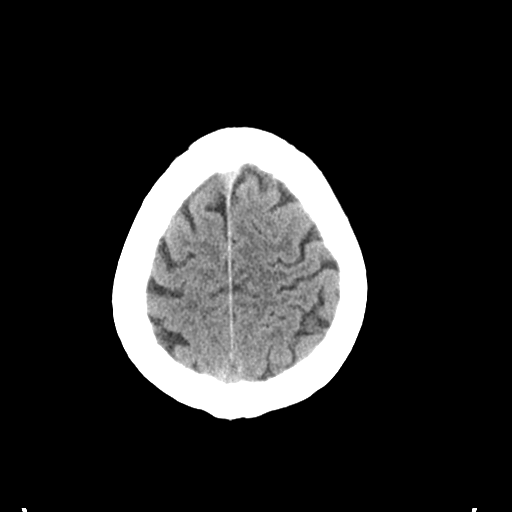
[im 31/34  brain]
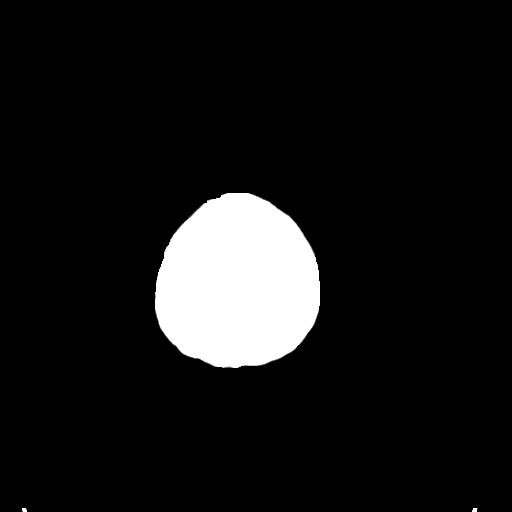

[Series 5: coronal soft tissue · coronal · 0.35mm/px · 3 of 71 slices shown]
[im 24/71  brain]
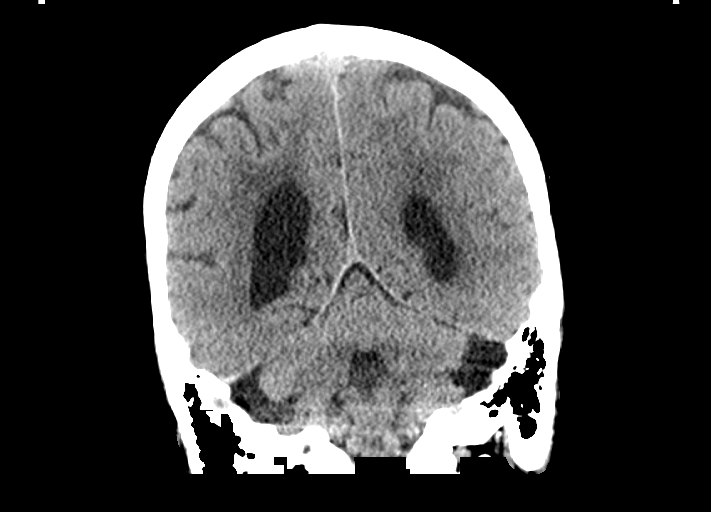
[im 32/71  brain]
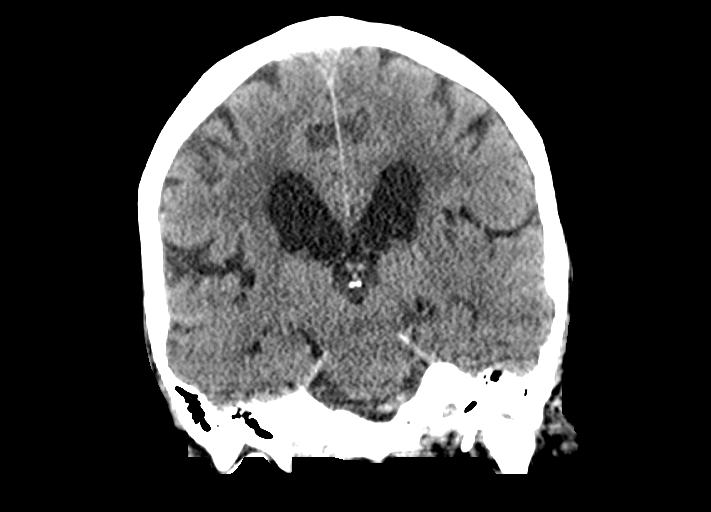
[im 39/71  brain]
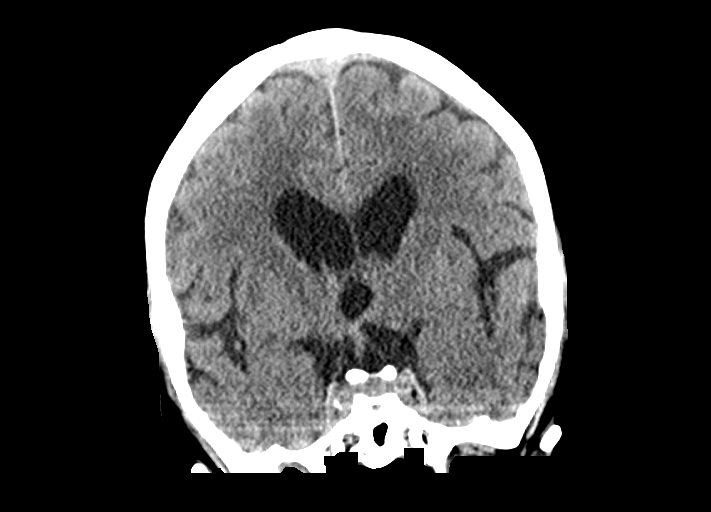

[Series 6: sagittal soft tissue · sagittal · 0.36mm/px · 3 of 56 slices shown]
[im 19/56  brain]
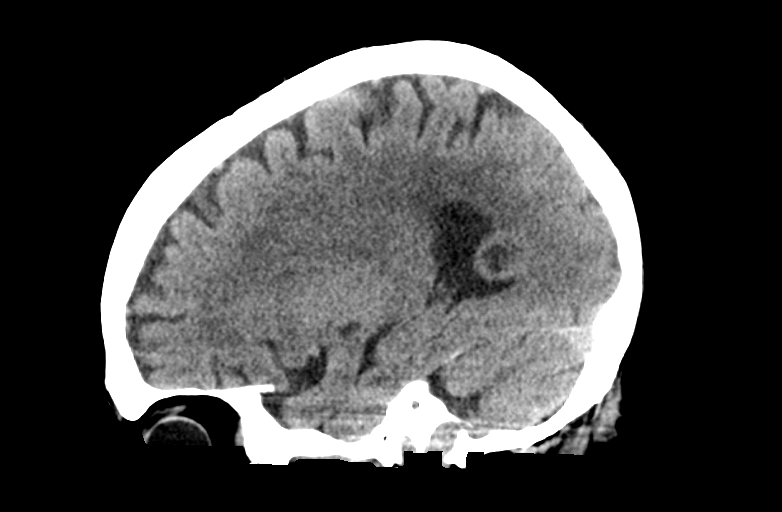
[im 28/56  brain]
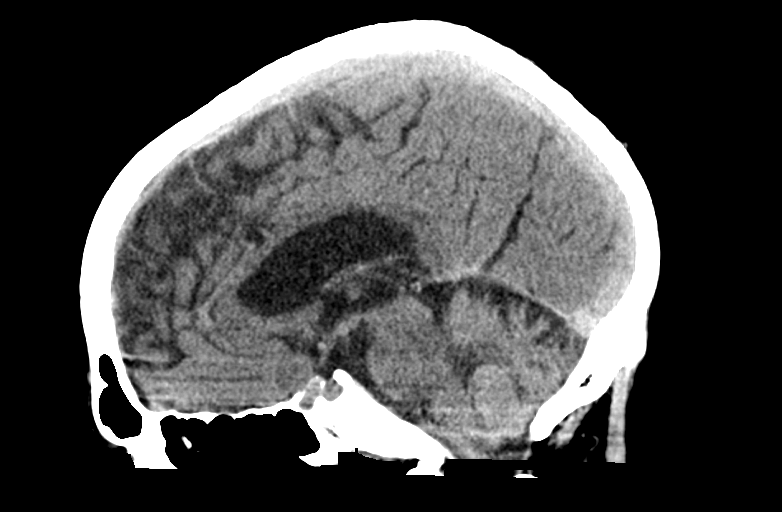
[im 37/56  brain]
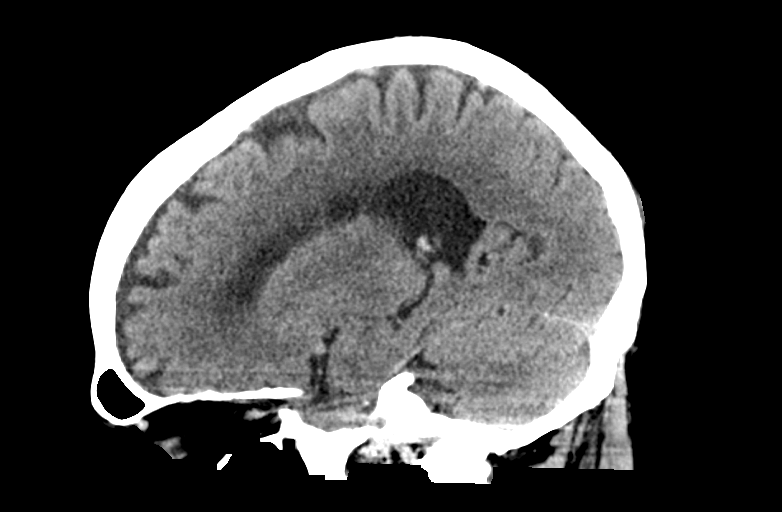

[14 of 47 positions shown; findings below may reference images not displayed]

FINDINGS: CT HEAD FINDINGS

Brain: Mild age-related atrophy and chronic microvascular ischemic
changes. There is no acute intracranial hemorrhage. No mass effect
midline shift. No extra-axial fluid collection.

Vascular: No hyperdense vessel or unexpected calcification.

Skull: Normal. Negative for fracture or focal lesion.

Sinuses/Orbits: No acute finding.

Other: None

CT CERVICAL SPINE FINDINGS

Alignment: No acute subluxation.

Skull base and vertebrae: No acute fracture.

Soft tissues and spinal canal: No prevertebral fluid or swelling. No
visible canal hematoma.

Disc levels: Multilevel degenerative changes with disc space
narrowing and endplate irregularity and spurring most prominent at
C5-C6 and C4-C5. Multilevel facet arthropathy.

Upper chest: Negative.

Other: Bilateral carotid bulb calcified plaques.
IMPRESSION: 1. No acute intracranial pathology. Mild age-related atrophy and
chronic microvascular ischemic changes.
2. No acute/traumatic cervical spine pathology. Multilevel
degenerative changes.

## 2023-11-13 NOTE — Progress Notes (Signed)
 SDW.  Called and updated patient on new date and time of 11/15/2023 at 0530.  ERAS until 0430.  Reviewed medications to take the morning of surgery.  No further questions.

## 2023-11-15 ENCOUNTER — Inpatient Hospital Stay (HOSPITAL_COMMUNITY)
Admission: RE | Disposition: A | Discharge status: Home Health | Payer: Self-pay | Source: Home / Self Care | Attending: Neurosurgery

## 2023-11-15 ENCOUNTER — Other Ambulatory Visit: Payer: Self-pay

## 2023-11-15 ENCOUNTER — Inpatient Hospital Stay (HOSPITAL_COMMUNITY): Payer: Self-pay | Admitting: Vascular Surgery

## 2023-11-15 ENCOUNTER — Inpatient Hospital Stay (HOSPITAL_COMMUNITY)

## 2023-11-15 ENCOUNTER — Inpatient Hospital Stay (HOSPITAL_COMMUNITY)
Admission: RE | Admit: 2023-11-15 | Discharge: 2023-11-17 | DRG: 448 | Disposition: A | Discharge status: Home Health | Attending: Neurosurgery | Admitting: Neurosurgery

## 2023-11-15 ENCOUNTER — Inpatient Hospital Stay (HOSPITAL_COMMUNITY): Admitting: Anesthesiology

## 2023-11-15 DIAGNOSIS — I2510 Atherosclerotic heart disease of native coronary artery without angina pectoris: Secondary | ICD-10-CM | POA: Diagnosis present

## 2023-11-15 DIAGNOSIS — Z79899 Other long term (current) drug therapy: Secondary | ICD-10-CM

## 2023-11-15 DIAGNOSIS — Z882 Allergy status to sulfonamides status: Secondary | ICD-10-CM

## 2023-11-15 DIAGNOSIS — Z807 Family history of other malignant neoplasms of lymphoid, hematopoietic and related tissues: Secondary | ICD-10-CM

## 2023-11-15 DIAGNOSIS — M48062 Spinal stenosis, lumbar region with neurogenic claudication: Secondary | ICD-10-CM

## 2023-11-15 DIAGNOSIS — Z01818 Encounter for other preprocedural examination: Secondary | ICD-10-CM

## 2023-11-15 DIAGNOSIS — M5416 Radiculopathy, lumbar region: Secondary | ICD-10-CM | POA: Diagnosis present

## 2023-11-15 DIAGNOSIS — Z79890 Hormone replacement therapy (postmenopausal): Secondary | ICD-10-CM | POA: Diagnosis not present

## 2023-11-15 DIAGNOSIS — I129 Hypertensive chronic kidney disease with stage 1 through stage 4 chronic kidney disease, or unspecified chronic kidney disease: Secondary | ICD-10-CM | POA: Diagnosis present

## 2023-11-15 DIAGNOSIS — J449 Chronic obstructive pulmonary disease, unspecified: Secondary | ICD-10-CM | POA: Diagnosis present

## 2023-11-15 DIAGNOSIS — Z96642 Presence of left artificial hip joint: Secondary | ICD-10-CM | POA: Diagnosis present

## 2023-11-15 DIAGNOSIS — F319 Bipolar disorder, unspecified: Secondary | ICD-10-CM | POA: Diagnosis present

## 2023-11-15 DIAGNOSIS — Z881 Allergy status to other antibiotic agents status: Secondary | ICD-10-CM | POA: Diagnosis not present

## 2023-11-15 DIAGNOSIS — N183 Chronic kidney disease, stage 3 unspecified: Secondary | ICD-10-CM | POA: Diagnosis present

## 2023-11-15 DIAGNOSIS — M4807 Spinal stenosis, lumbosacral region: Secondary | ICD-10-CM | POA: Diagnosis present

## 2023-11-15 DIAGNOSIS — E669 Obesity, unspecified: Secondary | ICD-10-CM | POA: Diagnosis present

## 2023-11-15 DIAGNOSIS — M48061 Spinal stenosis, lumbar region without neurogenic claudication: Principal | ICD-10-CM | POA: Diagnosis present

## 2023-11-15 DIAGNOSIS — J45909 Unspecified asthma, uncomplicated: Secondary | ICD-10-CM | POA: Diagnosis present

## 2023-11-15 DIAGNOSIS — K219 Gastro-esophageal reflux disease without esophagitis: Secondary | ICD-10-CM | POA: Diagnosis present

## 2023-11-15 DIAGNOSIS — Z801 Family history of malignant neoplasm of trachea, bronchus and lung: Secondary | ICD-10-CM

## 2023-11-15 DIAGNOSIS — F419 Anxiety disorder, unspecified: Secondary | ICD-10-CM | POA: Diagnosis present

## 2023-11-15 DIAGNOSIS — Z85828 Personal history of other malignant neoplasm of skin: Secondary | ICD-10-CM | POA: Diagnosis not present

## 2023-11-15 DIAGNOSIS — E039 Hypothyroidism, unspecified: Secondary | ICD-10-CM | POA: Diagnosis present

## 2023-11-15 DIAGNOSIS — Z87442 Personal history of urinary calculi: Secondary | ICD-10-CM

## 2023-11-15 DIAGNOSIS — Z888 Allergy status to other drugs, medicaments and biological substances status: Secondary | ICD-10-CM

## 2023-11-15 DIAGNOSIS — Z6830 Body mass index (BMI) 30.0-30.9, adult: Secondary | ICD-10-CM

## 2023-11-15 DIAGNOSIS — Z8616 Personal history of COVID-19: Secondary | ICD-10-CM | POA: Diagnosis not present

## 2023-11-15 LAB — TYPE AND SCREEN
ABO/RH(D): A POS
Antibody Screen: NEGATIVE

## 2023-11-15 SURGERY — POSTERIOR LUMBAR FUSION 2 LEVEL
Anesthesia: General | Site: Spine Lumbar

## 2023-11-15 MED ORDER — ORAL CARE MOUTH RINSE: 15.0000 mL | Freq: Once | OROMUCOSAL | Status: AC

## 2023-11-15 MED ORDER — ONDANSETRON HCL 4 MG/2ML IJ SOLN
4.0000 mg | Freq: Four times a day (QID) | INTRAMUSCULAR | Status: DC | PRN
  Administered 2023-11-15: 4 mg via INTRAVENOUS
  Filled 2023-11-15: qty 2

## 2023-11-15 MED ORDER — ROCURONIUM BROMIDE 10 MG/ML (PF) SYRINGE
PREFILLED_SYRINGE | INTRAVENOUS | Status: DC | PRN
  Administered 2023-11-15: 20 mg via INTRAVENOUS
  Administered 2023-11-15: 30 mg via INTRAVENOUS
  Administered 2023-11-15: 70 mg via INTRAVENOUS
  Administered 2023-11-15 (×2): 30 mg via INTRAVENOUS
  Administered 2023-11-15: 20 mg via INTRAVENOUS
  Administered 2023-11-15: 30 mg via INTRAVENOUS

## 2023-11-15 MED ORDER — TIZANIDINE HCL 4 MG PO TABS
2.0000 mg | ORAL_TABLET | Freq: Four times a day (QID) | ORAL | Status: DC | PRN
  Administered 2023-11-16 – 2023-11-17 (×4): 2 mg via ORAL
  Filled 2023-11-15 (×4): qty 1

## 2023-11-15 MED ORDER — CHLORHEXIDINE GLUCONATE CLOTH 2 % EX PADS
6.0000 | MEDICATED_PAD | Freq: Every day | CUTANEOUS | Status: DC
  Administered 2023-11-15: 6 via TOPICAL

## 2023-11-15 MED ORDER — ACETAMINOPHEN 500 MG PO TABS: 500.0000 mg | ORAL_TABLET | Freq: Four times a day (QID) | ORAL | Status: DC | PRN

## 2023-11-15 MED ORDER — MENTHOL 3 MG MT LOZG: 1.0000 | LOZENGE | OROMUCOSAL | Status: DC | PRN

## 2023-11-15 MED ORDER — DOCUSATE SODIUM 100 MG PO CAPS
100.0000 mg | ORAL_CAPSULE | Freq: Two times a day (BID) | ORAL | Status: DC | PRN
  Administered 2023-11-15: 100 mg via ORAL
  Filled 2023-11-15: qty 1

## 2023-11-15 MED ORDER — PANTOPRAZOLE SODIUM 20 MG PO TBEC: 20.0000 mg | DELAYED_RELEASE_TABLET | Freq: Every day | ORAL | Status: DC

## 2023-11-15 MED ORDER — LIDOCAINE 2% (20 MG/ML) 5 ML SYRINGE
INTRAMUSCULAR | Status: DC | PRN
  Administered 2023-11-15: 100 mg via INTRAVENOUS

## 2023-11-15 MED ORDER — LOPERAMIDE HCL 2 MG PO CAPS: 2.0000 mg | ORAL_CAPSULE | Freq: Four times a day (QID) | ORAL | Status: DC | PRN

## 2023-11-15 MED ORDER — ONDANSETRON HCL 4 MG PO TABS: 4.0000 mg | ORAL_TABLET | Freq: Four times a day (QID) | ORAL | Status: DC | PRN

## 2023-11-15 MED ORDER — KETAMINE HCL 50 MG/5ML IJ SOSY
PREFILLED_SYRINGE | INTRAMUSCULAR | Status: AC
Start: 2023-11-15 — End: 2023-11-15
  Filled 2023-11-15: qty 5

## 2023-11-15 MED ORDER — FERROUS SULFATE 325 (65 FE) MG PO TABS
325.0000 mg | ORAL_TABLET | Freq: Three times a day (TID) | ORAL | Status: DC
  Filled 2023-11-15: qty 1

## 2023-11-15 MED ORDER — FENTANYL CITRATE (PF) 100 MCG/2ML IJ SOLN
INTRAMUSCULAR | Status: AC
  Filled 2023-11-15: qty 2

## 2023-11-15 MED ORDER — PROPYLENE GLYCOL (PF) 0.6 % OP SOLN: 1.0000 [drp] | Freq: Four times a day (QID) | OPHTHALMIC | Status: DC | PRN

## 2023-11-15 MED ORDER — ACETAMINOPHEN 10 MG/ML IV SOLN
1000.0000 mg | Freq: Once | INTRAVENOUS | Status: AC
  Administered 2023-11-15: 1000 mg via INTRAVENOUS

## 2023-11-15 MED ORDER — HYDROMORPHONE HCL 1 MG/ML IJ SOLN
INTRAMUSCULAR | Status: AC
Start: 2023-11-15 — End: 2023-11-15
  Filled 2023-11-15: qty 1

## 2023-11-15 MED ORDER — DEXAMETHASONE SODIUM PHOSPHATE 10 MG/ML IJ SOLN
INTRAMUSCULAR | Status: DC | PRN
  Administered 2023-11-15: 10 mg via INTRAVENOUS

## 2023-11-15 MED ORDER — CHLORHEXIDINE GLUCONATE CLOTH 2 % EX PADS: 6.0000 | MEDICATED_PAD | Freq: Once | CUTANEOUS | Status: DC

## 2023-11-15 MED ORDER — LEVOTHYROXINE SODIUM 25 MCG PO TABS
50.0000 ug | ORAL_TABLET | ORAL | Status: DC
  Administered 2023-11-16 – 2023-11-17 (×2): 50 ug via ORAL
  Filled 2023-11-15 (×2): qty 2

## 2023-11-15 MED ORDER — FENTANYL CITRATE (PF) 100 MCG/2ML IJ SOLN
25.0000 ug | INTRAMUSCULAR | Status: DC | PRN
  Administered 2023-11-15 (×3): 50 ug via INTRAVENOUS

## 2023-11-15 MED ORDER — LIDOCAINE 2% (20 MG/ML) 5 ML SYRINGE
INTRAMUSCULAR | Status: AC
  Filled 2023-11-15: qty 5

## 2023-11-15 MED ORDER — LIDOCAINE-EPINEPHRINE 1 %-1:100000 IJ SOLN
INTRAMUSCULAR | Status: DC | PRN
  Administered 2023-11-15: 10 mL

## 2023-11-15 MED ORDER — 0.9 % SODIUM CHLORIDE (POUR BTL) OPTIME
TOPICAL | Status: DC | PRN
  Administered 2023-11-15: 1000 mL

## 2023-11-15 MED ORDER — PHENYLEPHRINE 80 MCG/ML (10ML) SYRINGE FOR IV PUSH (FOR BLOOD PRESSURE SUPPORT)
PREFILLED_SYRINGE | INTRAVENOUS | Status: DC | PRN
  Administered 2023-11-15 (×5): 80 ug via INTRAVENOUS

## 2023-11-15 MED ORDER — ONDANSETRON HCL 4 MG/2ML IJ SOLN: 4.0000 mg | Freq: Once | INTRAMUSCULAR | Status: DC | PRN

## 2023-11-15 MED ORDER — KETAMINE HCL 10 MG/ML IJ SOLN
INTRAMUSCULAR | Status: DC | PRN
  Administered 2023-11-15 (×2): 10 mg via INTRAVENOUS
  Administered 2023-11-15: 20 mg via INTRAVENOUS

## 2023-11-15 MED ORDER — PROPOFOL 10 MG/ML IV BOLUS
INTRAVENOUS | Status: AC
  Filled 2023-11-15: qty 20

## 2023-11-15 MED ORDER — THROMBIN (RECOMBINANT) 20000 UNITS EX SOLR
CUTANEOUS | Status: AC
  Filled 2023-11-15: qty 20000

## 2023-11-15 MED ORDER — LACTATED RINGERS IV SOLN
INTRAVENOUS | Status: DC | PRN
Start: 2023-11-15 — End: 2023-11-15

## 2023-11-15 MED ORDER — HYDROMORPHONE HCL 1 MG/ML IJ SOLN
0.2500 mg | INTRAMUSCULAR | Status: DC | PRN
  Administered 2023-11-15 (×4): 0.5 mg via INTRAVENOUS

## 2023-11-15 MED ORDER — PROPOFOL 10 MG/ML IV BOLUS
INTRAVENOUS | Status: DC | PRN
  Administered 2023-11-15: 180 mg via INTRAVENOUS
  Administered 2023-11-15 (×2): 30 mg via INTRAVENOUS

## 2023-11-15 MED ORDER — ROCURONIUM BROMIDE 10 MG/ML (PF) SYRINGE
PREFILLED_SYRINGE | INTRAVENOUS | Status: AC
  Filled 2023-11-15: qty 10

## 2023-11-15 MED ORDER — THIAMINE MONONITRATE 100 MG PO TABS
100.0000 mg | ORAL_TABLET | Freq: Every day | ORAL | Status: DC
  Administered 2023-11-15 – 2023-11-17 (×3): 100 mg via ORAL
  Filled 2023-11-15 (×4): qty 1

## 2023-11-15 MED ORDER — ATORVASTATIN CALCIUM 40 MG PO TABS
40.0000 mg | ORAL_TABLET | Freq: Every morning | ORAL | Status: DC
  Administered 2023-11-15 – 2023-11-17 (×3): 40 mg via ORAL
  Filled 2023-11-15 (×3): qty 1

## 2023-11-15 MED ORDER — VITAMIN B-12 1000 MCG PO TABS
500.0000 ug | ORAL_TABLET | Freq: Every day | ORAL | Status: DC
  Administered 2023-11-15 – 2023-11-17 (×3): 500 ug via ORAL
  Filled 2023-11-15 (×3): qty 1

## 2023-11-15 MED ORDER — CHLORHEXIDINE GLUCONATE 0.12 % MT SOLN
15.0000 mL | Freq: Once | OROMUCOSAL | Status: AC
  Administered 2023-11-15: 15 mL via OROMUCOSAL
  Filled 2023-11-15: qty 15

## 2023-11-15 MED ORDER — COLCHICINE 0.6 MG PO TABS
0.6000 mg | ORAL_TABLET | Freq: Every morning | ORAL | Status: DC
  Administered 2023-11-15 – 2023-11-17 (×3): 0.6 mg via ORAL
  Filled 2023-11-15 (×3): qty 1

## 2023-11-15 MED ORDER — CLOBETASOL PROPIONATE 0.05 % EX CREA: 1.0000 | TOPICAL_CREAM | Freq: Every day | CUTANEOUS | Status: DC | PRN

## 2023-11-15 MED ORDER — PROPOFOL 500 MG/50ML IV EMUL
INTRAVENOUS | Status: DC | PRN
  Administered 2023-11-15: 100 ug/kg/min via INTRAVENOUS

## 2023-11-15 MED ORDER — ACETAMINOPHEN 10 MG/ML IV SOLN
INTRAVENOUS | Status: AC
  Filled 2023-11-15: qty 100

## 2023-11-15 MED ORDER — SODIUM CHLORIDE 0.9 % IV SOLN
250.0000 mL | INTRAVENOUS | Status: AC
  Administered 2023-11-15: 250 mL via INTRAVENOUS

## 2023-11-15 MED ORDER — LEVOTHYROXINE SODIUM 100 MCG PO TABS: 100.0000 ug | ORAL_TABLET | ORAL | Status: DC

## 2023-11-15 MED ORDER — PHENOL 1.4 % MT LIQD: 1.0000 | OROMUCOSAL | Status: DC | PRN

## 2023-11-15 MED ORDER — HYDROMORPHONE HCL 1 MG/ML IJ SOLN
INTRAMUSCULAR | Status: AC
Start: 2023-11-15 — End: 2023-11-15
  Filled 2023-11-15: qty 0.5

## 2023-11-15 MED ORDER — HYDROCODONE-ACETAMINOPHEN 5-325 MG PO TABS
2.0000 | ORAL_TABLET | ORAL | Status: DC | PRN
  Administered 2023-11-15 – 2023-11-17 (×10): 2 via ORAL
  Filled 2023-11-15 (×10): qty 2

## 2023-11-15 MED ORDER — PANTOPRAZOLE SODIUM 40 MG IV SOLR
40.0000 mg | Freq: Every day | INTRAVENOUS | Status: DC
  Administered 2023-11-15: 40 mg via INTRAVENOUS
  Filled 2023-11-15: qty 10

## 2023-11-15 MED ORDER — POLYVINYL ALCOHOL 1.4 % OP SOLN: 1.0000 [drp] | OPHTHALMIC | Status: DC | PRN

## 2023-11-15 MED ORDER — CEFAZOLIN SODIUM-DEXTROSE 2-4 GM/100ML-% IV SOLN
2.0000 g | INTRAVENOUS | Status: DC
  Filled 2023-11-15: qty 100

## 2023-11-15 MED ORDER — GABAPENTIN 300 MG PO CAPS
300.0000 mg | ORAL_CAPSULE | Freq: Once | ORAL | Status: AC
  Administered 2023-11-15: 300 mg via ORAL
  Filled 2023-11-15: qty 1

## 2023-11-15 MED ORDER — FERROUS SULFATE 325 (65 FE) MG PO TABS
325.0000 mg | ORAL_TABLET | Freq: Three times a day (TID) | ORAL | Status: DC
  Administered 2023-11-16 – 2023-11-17 (×4): 325 mg via ORAL
  Filled 2023-11-15 (×4): qty 1

## 2023-11-15 MED ORDER — SENNA 8.6 MG PO TABS
1.0000 | ORAL_TABLET | Freq: Two times a day (BID) | ORAL | Status: DC | PRN
  Administered 2023-11-15: 8.6 mg via ORAL
  Filled 2023-11-15: qty 1

## 2023-11-15 MED ORDER — CEFAZOLIN SODIUM-DEXTROSE 2-4 GM/100ML-% IV SOLN
2.0000 g | Freq: Three times a day (TID) | INTRAVENOUS | Status: AC
  Administered 2023-11-15 (×2): 2 g via INTRAVENOUS
  Filled 2023-11-15 (×2): qty 100

## 2023-11-15 MED ORDER — ONDANSETRON HCL 4 MG/2ML IJ SOLN
INTRAMUSCULAR | Status: AC
Start: 2023-11-15 — End: 2023-11-15
  Filled 2023-11-15: qty 2

## 2023-11-15 MED ORDER — LEVOTHYROXINE SODIUM 25 MCG PO TABS: 50.0000 ug | ORAL_TABLET | ORAL | Status: DC

## 2023-11-15 MED ORDER — LACTATED RINGERS IV SOLN: INTRAVENOUS | Status: DC

## 2023-11-15 MED ORDER — STERILE WATER FOR IRRIGATION IR SOLN
Status: DC | PRN
  Administered 2023-11-15: 1000 mL

## 2023-11-15 MED ORDER — ROCURONIUM BROMIDE 10 MG/ML (PF) SYRINGE
PREFILLED_SYRINGE | INTRAVENOUS | Status: AC
  Filled 2023-11-15: qty 20

## 2023-11-15 MED ORDER — ACETAMINOPHEN 325 MG PO TABS
650.0000 mg | ORAL_TABLET | ORAL | Status: DC | PRN
Start: 2023-11-15 — End: 2023-11-17

## 2023-11-15 MED ORDER — ALUM & MAG HYDROXIDE-SIMETH 200-200-20 MG/5ML PO SUSP: 30.0000 mL | Freq: Four times a day (QID) | ORAL | Status: DC | PRN

## 2023-11-15 MED ORDER — ACETAMINOPHEN 650 MG RE SUPP: 650.0000 mg | RECTAL | Status: DC | PRN

## 2023-11-15 MED ORDER — CEFAZOLIN SODIUM-DEXTROSE 2-3 GM-%(50ML) IV SOLR
INTRAVENOUS | Status: DC | PRN
Start: 2023-11-15 — End: 2023-11-15
  Administered 2023-11-15: 2 g via INTRAVENOUS

## 2023-11-15 MED ORDER — TAMSULOSIN HCL 0.4 MG PO CAPS
0.4000 mg | ORAL_CAPSULE | Freq: Every day | ORAL | Status: DC
  Administered 2023-11-15 – 2023-11-17 (×3): 0.4 mg via ORAL
  Filled 2023-11-15 (×3): qty 1

## 2023-11-15 MED ORDER — SUGAMMADEX SODIUM 200 MG/2ML IV SOLN
INTRAVENOUS | Status: DC | PRN
  Administered 2023-11-15: 200 mg via INTRAVENOUS

## 2023-11-15 MED ORDER — TRIAMCINOLONE ACETONIDE 0.5 % EX CREA
TOPICAL_CREAM | Freq: Two times a day (BID) | CUTANEOUS | Status: DC
  Filled 2023-11-15: qty 15

## 2023-11-15 MED ORDER — ONDANSETRON HCL 4 MG/2ML IJ SOLN
INTRAMUSCULAR | Status: DC | PRN
  Administered 2023-11-15: 4 mg via INTRAVENOUS

## 2023-11-15 MED ORDER — LIDOCAINE-EPINEPHRINE 1 %-1:100000 IJ SOLN
INTRAMUSCULAR | Status: AC
Start: 2023-11-15 — End: 2023-11-15
  Filled 2023-11-15: qty 1

## 2023-11-15 MED ORDER — HYDROMORPHONE HCL 1 MG/ML IJ SOLN
0.5000 mg | INTRAMUSCULAR | Status: DC | PRN
  Administered 2023-11-15: 0.5 mg via INTRAVENOUS
  Filled 2023-11-15: qty 0.5

## 2023-11-15 MED ORDER — IRBESARTAN 75 MG PO TABS
37.5000 mg | ORAL_TABLET | Freq: Every day | ORAL | Status: DC
  Administered 2023-11-15 – 2023-11-17 (×3): 37.5 mg via ORAL
  Filled 2023-11-15 (×3): qty 1

## 2023-11-15 MED ORDER — PROBENECID 500 MG PO TABS
500.0000 mg | ORAL_TABLET | Freq: Every day | ORAL | Status: DC
  Administered 2023-11-15 – 2023-11-17 (×3): 500 mg via ORAL
  Filled 2023-11-15 (×3): qty 1

## 2023-11-15 MED ORDER — FENTANYL CITRATE (PF) 250 MCG/5ML IJ SOLN
INTRAMUSCULAR | Status: DC | PRN
  Administered 2023-11-15: 50 ug via INTRAVENOUS
  Administered 2023-11-15: 150 ug via INTRAVENOUS
  Administered 2023-11-15: 50 ug via INTRAVENOUS

## 2023-11-15 MED ORDER — DEXAMETHASONE SODIUM PHOSPHATE 10 MG/ML IJ SOLN
INTRAMUSCULAR | Status: AC
  Filled 2023-11-15: qty 1

## 2023-11-15 MED ORDER — DULOXETINE HCL 60 MG PO CPEP
60.0000 mg | ORAL_CAPSULE | Freq: Every morning | ORAL | Status: DC
  Administered 2023-11-15 – 2023-11-17 (×3): 60 mg via ORAL
  Filled 2023-11-15 (×3): qty 1

## 2023-11-15 MED ORDER — ALBUMIN HUMAN 5 % IV SOLN: INTRAVENOUS | Status: DC | PRN

## 2023-11-15 MED ORDER — SODIUM CHLORIDE 0.9% FLUSH
3.0000 mL | Freq: Two times a day (BID) | INTRAVENOUS | Status: DC
  Administered 2023-11-15 – 2023-11-16 (×3): 3 mL via INTRAVENOUS

## 2023-11-15 MED ORDER — SODIUM CHLORIDE 0.9% FLUSH
3.0000 mL | INTRAVENOUS | Status: DC | PRN
Start: 2023-11-15 — End: 2023-11-17

## 2023-11-15 MED ORDER — CLONAZEPAM 0.5 MG PO TABS: 0.5000 mg | ORAL_TABLET | Freq: Every evening | ORAL | Status: DC | PRN

## 2023-11-15 MED ORDER — DUTASTERIDE 0.5 MG PO CAPS
0.5000 mg | ORAL_CAPSULE | Freq: Every evening | ORAL | Status: DC
  Administered 2023-11-15 – 2023-11-16 (×2): 0.5 mg via ORAL
  Filled 2023-11-15 (×3): qty 1

## 2023-11-15 MED ORDER — ACETAMINOPHEN 500 MG PO TABS
1000.0000 mg | ORAL_TABLET | Freq: Once | ORAL | Status: AC
  Administered 2023-11-15: 1000 mg via ORAL
  Filled 2023-11-15: qty 2

## 2023-11-15 MED ORDER — BUPIVACAINE LIPOSOME 1.3 % IJ SUSP
INTRAMUSCULAR | Status: AC
  Filled 2023-11-15: qty 20

## 2023-11-15 MED ORDER — THROMBIN 20000 UNITS EX SOLR
CUTANEOUS | Status: DC | PRN
  Administered 2023-11-15: 20 mL via TOPICAL

## 2023-11-15 MED ORDER — PHENYLEPHRINE HCL-NACL 20-0.9 MG/250ML-% IV SOLN
INTRAVENOUS | Status: DC | PRN
  Administered 2023-11-15: 30 ug/min via INTRAVENOUS

## 2023-11-15 MED ORDER — TRAMADOL HCL 50 MG PO TABS: 50.0000 mg | ORAL_TABLET | Freq: Four times a day (QID) | ORAL | Status: DC | PRN

## 2023-11-15 MED ORDER — CHLORHEXIDINE GLUCONATE 0.12 % MT SOLN
OROMUCOSAL | Status: AC
  Filled 2023-11-15: qty 15

## 2023-11-15 MED ORDER — EZETIMIBE 10 MG PO TABS
10.0000 mg | ORAL_TABLET | Freq: Every morning | ORAL | Status: DC
  Administered 2023-11-15 – 2023-11-17 (×3): 10 mg via ORAL
  Filled 2023-11-15 (×3): qty 1

## 2023-11-15 MED ORDER — OYSTER SHELL CALCIUM/D3 500-5 MG-MCG PO TABS: 1.0000 | ORAL_TABLET | Freq: Every day | ORAL | Status: DC

## 2023-11-15 MED ORDER — SUGAMMADEX SODIUM 200 MG/2ML IV SOLN
INTRAVENOUS | Status: AC
  Filled 2023-11-15: qty 2

## 2023-11-15 MED ORDER — EPHEDRINE SULFATE-NACL 50-0.9 MG/10ML-% IV SOSY
PREFILLED_SYRINGE | INTRAVENOUS | Status: DC | PRN
  Administered 2023-11-15: 10 mg via INTRAVENOUS

## 2023-11-15 MED ORDER — OYSTER SHELL CALCIUM/D3 500-5 MG-MCG PO TABS
1.0000 | ORAL_TABLET | Freq: Every day | ORAL | Status: DC
  Administered 2023-11-16 – 2023-11-17 (×2): 1 via ORAL
  Filled 2023-11-15 (×2): qty 1

## 2023-11-15 MED ORDER — HYDROMORPHONE HCL 1 MG/ML IJ SOLN
INTRAMUSCULAR | Status: AC
  Filled 2023-11-15: qty 1

## 2023-11-15 MED ORDER — BUPIVACAINE LIPOSOME 1.3 % IJ SUSP
INTRAMUSCULAR | Status: DC | PRN
  Administered 2023-11-15: 20 mL

## 2023-11-15 MED ORDER — FENTANYL CITRATE (PF) 250 MCG/5ML IJ SOLN
INTRAMUSCULAR | Status: AC
Start: 2023-11-15 — End: 2023-11-15
  Filled 2023-11-15: qty 5

## 2023-11-15 SURGICAL SUPPLY — 59 items
BAG COUNTER SPONGE SURGICOUNT (BAG) ×1 IMPLANT
BASKET BONE COLLECTION (BASKET) ×1 IMPLANT
BENZOIN TINCTURE PRP APPL 2/3 (GAUZE/BANDAGES/DRESSINGS) ×1 IMPLANT
BLADE BONE MILL MEDIUM (MISCELLANEOUS) ×1 IMPLANT
BLADE SURG 11 STRL SS (BLADE) ×1 IMPLANT
BUR CUTTER 7.0 ROUND (BURR) ×1 IMPLANT
BUR MATCHSTICK NEURO 3.0 LAGG (BURR) ×1 IMPLANT
CANISTER SUCTION 3000ML PPV (SUCTIONS) ×1 IMPLANT
CNTNR URN SCR LID CUP LEK RST (MISCELLANEOUS) ×1 IMPLANT
COVER BACK TABLE 60X90IN (DRAPES) ×1 IMPLANT
DERMABOND ADVANCED .7 DNX12 (GAUZE/BANDAGES/DRESSINGS) IMPLANT
DRAPE C-ARM 42X72 X-RAY (DRAPES) ×1 IMPLANT
DRAPE C-ARMOR (DRAPES) IMPLANT
DRAPE HALF SHEET 40X57 (DRAPES) IMPLANT
DRAPE LAPAROTOMY 100X72X124 (DRAPES) ×1 IMPLANT
DRAPE SURG 17X23 STRL (DRAPES) ×1 IMPLANT
DRSG OPSITE 4X5.5 SM (GAUZE/BANDAGES/DRESSINGS) IMPLANT
DRSG OPSITE POSTOP 4X10 (GAUZE/BANDAGES/DRESSINGS) IMPLANT
DURAPREP 26ML APPLICATOR (WOUND CARE) ×1 IMPLANT
ELECTRODE BLDE 4.0 EZ CLN MEGD (MISCELLANEOUS) IMPLANT
ELECTRODE REM PT RTRN 9FT ADLT (ELECTROSURGICAL) ×1 IMPLANT
EVACUATOR 1/8 PVC DRAIN (DRAIN) ×1 IMPLANT
GAUZE SPONGE 4X4 12PLY STRL (GAUZE/BANDAGES/DRESSINGS) ×1 IMPLANT
GLOVE BIO SURGEON STRL SZ8 (GLOVE) ×2 IMPLANT
GLOVE INDICATOR 8.5 STRL (GLOVE) ×2 IMPLANT
GOWN STRL REUS W/ TWL LRG LVL3 (GOWN DISPOSABLE) IMPLANT
GOWN STRL REUS W/ TWL XL LVL3 (GOWN DISPOSABLE) ×2 IMPLANT
GOWN STRL REUS W/TWL 2XL LVL3 (GOWN DISPOSABLE) IMPLANT
GRAFT BNE MATRIX VG FRMBL SM 1 (Bone Implant) IMPLANT
KIT BASIN OR (CUSTOM PROCEDURE TRAY) ×1 IMPLANT
KIT TURNOVER KIT B (KITS) ×1 IMPLANT
MILL BONE PREP (MISCELLANEOUS) ×1 IMPLANT
NDL HYPO 21X1.5 SAFETY (NEEDLE) ×1 IMPLANT
NDL HYPO 25X1 1.5 SAFETY (NEEDLE) ×1 IMPLANT
NEEDLE HYPO 21X1.5 SAFETY (NEEDLE) ×1 IMPLANT
NEEDLE HYPO 25X1 1.5 SAFETY (NEEDLE) ×1 IMPLANT
NS IRRIG 1000ML POUR BTL (IV SOLUTION) ×1 IMPLANT
PACK LAMINECTOMY NEURO (CUSTOM PROCEDURE TRAY) ×1 IMPLANT
PAD ARMBOARD POSITIONER FOAM (MISCELLANEOUS) ×3 IMPLANT
PATTIES SURGICAL 1X1 (DISPOSABLE) IMPLANT
ROD LORD LIPPED TI 5.5X65 (Rod) IMPLANT
ROD LORD LIPPED TI 5.5X70 (Rod) IMPLANT
SCREW CANC MOD 6X35 (Screw) IMPLANT
SCREW CANC SHANK MOD 5.5X35 (Screw) IMPLANT
SCREW POLYAXIAL TULIP (Screw) IMPLANT
SET SCREW SPNE (Screw) IMPLANT
SPACER IDENTITI PS 5D 8X9X25 (Spacer) IMPLANT
SPACER IDENTITI PS 8X9X25 15D (Spacer) IMPLANT
SPIKE FLUID TRANSFER (MISCELLANEOUS) ×1 IMPLANT
SPONGE SURGIFOAM ABS GEL 100 (HEMOSTASIS) ×1 IMPLANT
STRIP CLOSURE SKIN 1/2X4 (GAUZE/BANDAGES/DRESSINGS) IMPLANT
SUT VIC AB 0 CT1 18XCR BRD8 (SUTURE) ×1 IMPLANT
SUT VIC AB 2-0 CT1 18 (SUTURE) ×1 IMPLANT
SUT VIC AB 4-0 PS2 27 (SUTURE) ×1 IMPLANT
SYR 20ML LL LF (SYRINGE) ×1 IMPLANT
TOWEL GREEN STERILE (TOWEL DISPOSABLE) ×1 IMPLANT
TOWEL GREEN STERILE FF (TOWEL DISPOSABLE) ×1 IMPLANT
TRAY FOLEY MTR SLVR 16FR STAT (SET/KITS/TRAYS/PACK) ×1 IMPLANT
WATER STERILE IRR 1000ML POUR (IV SOLUTION) ×1 IMPLANT

## 2023-11-15 NOTE — Transfer of Care (Signed)
 Immediate Anesthesia Transfer of Care Note  Patient: Simcha Speir  Procedure(s) Performed: POSTERIOR LUMBAR FUSION 2 LEVEL (Spine Lumbar)  Patient Location: PACU  Anesthesia Type:General  Level of Consciousness: awake and sedated  Airway & Oxygen Therapy: Patient Spontanous Breathing and Patient connected to face mask oxygen  Post-op Assessment: Report given to RN and Post -op Vital signs reviewed and stable  Post vital signs: Reviewed and stable  Last Vitals:  Vitals Value Taken Time  BP 84/72 11/15/23 12:00  Temp    Pulse 86 11/15/23 12:01  Resp 18 11/15/23 12:01  SpO2 89 % 11/15/23 12:01  Vitals shown include unfiled device data.  Last Pain:  Vitals:   11/15/23 1152  TempSrc:   PainSc: 7          Complications: No notable events documented.

## 2023-11-15 NOTE — Plan of Care (Signed)

## 2023-11-15 NOTE — Anesthesia Postprocedure Evaluation (Signed)
 Anesthesia Post Note  Patient: Sports administrator  Procedure(s) Performed: POSTERIOR LUMBAR FUSION 2 LEVEL (Spine Lumbar)     Patient location during evaluation: PACU Anesthesia Type: General Level of consciousness: awake and alert Pain management: pain level controlled Vital Signs Assessment: post-procedure vital signs reviewed and stable Respiratory status: spontaneous breathing, nonlabored ventilation, respiratory function stable and patient connected to nasal cannula oxygen Cardiovascular status: blood pressure returned to baseline and stable Postop Assessment: no apparent nausea or vomiting Anesthetic complications: no   No notable events documented.  Last Vitals:    Last Pain:                 Miguel Williams

## 2023-11-15 NOTE — Progress Notes (Signed)
 Orthopedic Tech Progress Note Patient Details:  Miguel Williams 31-Oct-1945 [id number]  Ortho Devices Type of Ortho Device: Lumbar corsett Ortho Device/Splint Location: back Ortho Device/Splint Interventions: Ordered, Adjustment  Patient was asleep and unable to get up per RN, delivered and adjusted LSO to approximate size of patient's torso.  Post Interventions Patient Tolerated: Other (comment) Instructions Provided: Other (comment)  Camellia Bo 11/15/2023, 2:15 PM

## 2023-11-15 NOTE — H&P (Signed)
 Miguel Williams is an 78 y.o. male.   Chief Complaint: Back and bilateral hip and leg pain HPI: 78 year old gentleman with progressive worsening back and bilateral hip and leg pain neurogenic claudication.  Workup revealed severe spinal stenosis at L4-5 and L5-S1 and due to the patient's progression of clinical syndrome failed conservative treatment imaging findings I recommended decompression and interbody fusion at L4-5 and L5-S1.  I extensively reviewed the risks and benefits of the operation with the patient as well as perioperative course expectations of outcome and alternatives to surgery and he understands and agrees to proceed forward.  Past Medical History:  Diagnosis Date   Abdominal pain, lower    Agitation    Allergy, unspecified, initial encounter    Anxiety disorder    Arthritis    Asthma    As a child   Bipolar disorder [facility]    Patient denies   Cancer [facility])    Bladder   Cancer (HCC)    Skin cancer   Chronic kidney disease, stage III (moderate) (HCC)    Chronic kidney disease, stage III (moderate) (HCC)    COPD (chronic obstructive pulmonary disease) (HCC)    Pt denies (10/2023)   Coronary atherosclerosis of native coronary artery    Esophageal reflux    Fatigue    Fungal granuloma    Hip pain    History of 2019 novel coronavirus disease (COVID-19)    History of endoscopy 02/00/2011   History of kidney stones    Hypertension    Hypo-osmolality and hyponatremia    Hypokalemia    Hypothyroidism    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 now has 3oz of wine every other day (as of 10/2023)   Umbilical hernia    Unspecified fall, subsequent encounter    Vitamin D deficiency, unspecified    Wernicke's encephalopathy 06/23/2019    Past Surgical History:  Procedure Laterality Date   CATARACT EXTRACTION W/ INTRAOCULAR LENS IMPLANT Bilateral    COLONOSCOPY  04/2023   COLONOSCOPY W/ ENDOSCOPIC US      CYSTOSCOPY WITH FULGERATION N/A 03/09/2023   Procedure: CYSTOSCOPY WITH FULGERATION WITH CLOT EVACUATION;  Surgeon: Devere Lonni Righter, MD;  Location: [facility];  Service: Urology;  Laterality: N/A;   CYSTOSCOPY WITH URETHRAL DILATATION N/A 03/08/2023   Procedure: CYSTOSCOPY;  Surgeon: Devere Lonni Righter, MD;  Location: [facility];  Service: Urology;  Laterality: N/A;  30 MINUTES   ESOPHAGOGASTRODUODENOSCOPY     TONSILLECTOMY  04/25/1950    Family History  Problem Relation Age of Onset   Multiple myeloma Mother    Lung cancer Father    Social History:  reports that he has never smoked. He has never used smokeless tobacco. He reports current alcohol  use. He reports that he does not currently use drugs.  Allergies:  Allergies  Allergen Reactions   Allopurinol Other (See Comments)    Had a bad reaction  Bacitracin-Polymyxin B Hives   Baclofen Other (See Comments)    Hallucinations- required admit to hospital for adverse drug rxn.   Neomycin Other (See Comments)    Allergic skin reaction   Tegretol  [Carbamazepine ] Hives   Sulfa Antibiotics Hives and Rash    Medications Prior to Admission  Medication Sig Dispense Refill   acetaminophen  (TYLENOL ) 500 MG tablet Take 1 tablet (500 mg total) by mouth every 6 (six) hours as needed for mild pain, moderate pain or fever. 30 tablet 0   atorvastatin  (LIPITOR) 40 MG tablet Take 40 mg by mouth in the morning.     betamethasone dipropionate 0.05 % cream Apply 1 Application topically 2 (two) times daily as  needed (skin irritation.).     calcium -vitamin D (OSCAL WITH D) 500-5 MG-MCG tablet Take 1 tablet by mouth.     CLOBETASOL  PROPIONATE E 0.05 % emollient cream Apply 1 Application topically daily as needed (irritation).     clonazePAM  (KLONOPIN ) 0.5 MG tablet Take 0.5 mg by mouth at bedtime as needed (sleep/anxiety).     colchicine  0.6 MG tablet Take 0.6 mg by mouth in the morning.     cyanocobalamin  (VITAMIN B12) 500 MCG tablet Take 500 mcg by mouth daily.     DULoxetine  (CYMBALTA ) 60 MG capsule Take 60 mg by mouth in the morning.     dutasteride  (AVODART ) 0.5 MG capsule Take 1 capsule (0.5 mg total) by mouth every other day. (Patient taking differently: Take 0.5 mg by mouth every evening.) 30 capsule 0   ezetimibe  (ZETIA ) 10 MG tablet Take 10 mg by mouth in the morning.     ferrous sulfate  325 (65 FE) MG EC tablet Take 325 mg by mouth 3 (three) times daily with meals.     lansoprazole (PREVACID) 30 MG capsule Take 30 mg by mouth in the morning and at bedtime.     levothyroxine  (SYNTHROID ) 50 MCG tablet Take 50-100 mcg by mouth See admin instructions. Take 2 tablets (100 mcg) by mouth in the morning on Sundays.  Take 1 tablet (50 mcg) by mouth in the morning on Mondays, Tuesdays, Wednesdays, Thursdays, Fridays & Saturdays.     loperamide  (IMODIUM  A-D) 2 MG tablet Take 2 mg by mouth 4 (four) times daily as needed for diarrhea or loose stools.     probenecid  (BENEMID ) 500 MG tablet Take 1 tablet (500 mg total) by mouth daily. 30 tablet 1   SYSTANE COMPLETE PF 0.6 % SOLN Place 1 drop into both eyes 4 (four) times daily as needed (for dryness).     Tamsulosin  HCl (FLOMAX ) 0.4 MG CAPS TAKE 1 CAPSULE (0.4 MG TOTAL) BY MOUTH DAILY. 30 capsule 6   thiamine  (VITAMIN B-1) 100 MG tablet Take 1 tablet (100 mg total) by mouth daily.     traMADol  (ULTRAM ) 50 MG tablet Take 50 mg by mouth every 6 (six) hours as needed (pain.).     valsartan (DIOVAN) 80 MG tablet Take 80 mg by mouth at bedtime.     tiZANidine   (ZANAFLEX ) 2 MG tablet Take 2 mg by mouth every 6 (six) hours as needed for muscle spasms.      No results found for this or any previous visit (from the past 48 hours). No results found.  Review of Systems  Musculoskeletal:  Positive for back pain.  Neurological:  Positive for numbness.    Blood pressure (!) 145/90, pulse 99, temperature 97.7 F (36.5 C), temperature source Oral, resp. rate 18, height 5' 8 (1.727  m), weight 91.2 kg, SpO2 95%. Physical Exam HENT:     Head: Normocephalic.     Right Ear: Tympanic membrane normal.     Nose: Nose normal.     Mouth/Throat:     Mouth: Mucous membranes are moist.  Cardiovascular:     Rate and Rhythm: Normal rate.     Pulses: Normal pulses.  Pulmonary:     Effort: Pulmonary effort is normal.  Musculoskeletal:        General: Normal range of motion.     Cervical back: Normal range of motion.  Skin:    General: Skin is warm.  Neurological:     Mental Status: He is alert.     Comments: Strength is 5 out of 5 iliopsoas, quads, hamstrings, gastrocs, and tibialis, and EHL.      Assessment/Plan 78 year old presents for decompression stabilization procedure at L4-5 and L5-S1  Arley SHAUNNA Helling, MD 11/15/2023, 7:19 AM

## 2023-11-15 NOTE — Op Note (Signed)
 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  Postoperative diagnosis: Same.  Procedure: #1 complete decompressive laminectomies L4-5 L5-S1 with complete medial facetectomies removal of the pars and radical foraminotomies the L4-L5 and S1 nerve roots in excess and requiring more work with them will be needed with a standard interbody fusion.  2.  Posterior lumbar interbody fusion L4-5 L5-S1 utilizing the Alphate[employer] titanium cages packed with locally harvested autograft mixed with Vivigen.  3.  Cortical screw fixation L4-5 L5-S1 utilizing the L4 health tech modular cortical screw set.  Surgeon: Arley helling.  Assistant: Suzen Click.  Anesthesia: General.  EBL: Minimal.  HPI: 78 year old gentleman with longstanding back and bilateral hip and leg pain workup revealed severe spinal stenosis L4-5 L5-S1 with severe foraminal stenosis at the L4-L5 and S1 nerve roots.  In addition neurogenic claudication.  He failed all forms conservative treatment and due to imaging findings and progression of clinical syndrome I recommended decompressive laminectomies and interbody fusions at those 2 levels.  I extensively reviewed the risks and benefits of the operation with the patient as well as perioperative course expectations of outcome and alternatives to surgery and he understood and agreed to proceed forward.  Operative procedure: Patient was brought into the OR was induced under general anesthesia positioned prone the Wilson frame his back was prepped and draped in routine sterile fashion.  Preoperative x-ray localized the appropriate level so after infiltration of 10 cc lidocaine  with epi midline incision was made and Bovie electrocautery was used to take down the subcutaneous tissue and subperiosteal dissection was carried lamina of L4-L5 and S1.  Interoperative x-ray confirmed identification appropriate level.  So utilizing a high-speed drill  the facet joints were all drilled down and the spinous process was removed and central decompression was begun.  I performed complete medial facetectomies of the L4-5 and L5-S1 facets and aggressively under bit the superior tickling facet to gain access to the lateral margin of the space.  The pars interarticularis was then removed during the facetectomy and radical foraminotomies of the L4 and L5 nerve roots were carried out.  Then attention taken the interbody work disc base was incised bilaterally first working at L5-S1 this was markedly collapsed but utilized sequential distraction I opened it up and get a 9 distractor and selected 8 mm cages and after adequate endplate preparation bilaterally with was achieved I inserted cages on both sides with extensive mount of autograft material centrally.  This significantly opened up the disc base and decompressed both 5 in the 1 foramen as well.  Then 4 5 similar fashion posteriorly the space is markedly collapsed was opened up with sequential distraction to space was cleaned out endplates prepared bilateral cages were inserted with an extensive mount of autograft mixed centrally.  I then placed cortical screws under fluoroscopy at L4-L5 and S1 all screws had excellent purchase.  Then sized up rods tightened everything in place compressed L4 against L5.  All the foramina were reinspected to confirm patency and no migration of graft material Gelfoam was ON top of the dura a medium Hemovac drain was placed and the wound was closed in layers with interrupted Vicryl and the skin was closed a running 4 subcuticular.  Dermabond benzoin Steri-Strips and a sterile dressing was applied patient to cover him in stable condition.  At the end the case all needle count sponge counts were correct.

## 2023-11-15 NOTE — Anesthesia Procedure Notes (Signed)
 Procedure Name: Intubation Date/Time: 11/15/2023 7:42 AM  Performed by: Alsha Meland C, CRNAPre-anesthesia Checklist: Patient identified, Emergency Drugs available, Suction available and Patient being monitored Patient Re-evaluated:Patient Re-evaluated prior to induction Oxygen Delivery Method: Circle System Utilized Preoxygenation: Pre-oxygenation with 100% oxygen Induction Type: IV induction Ventilation: Mask ventilation without difficulty Laryngoscope Size: Mac and 4 Grade View: Grade II Tube type: Oral Tube size: 7.5 mm Number of attempts: 1 Airway Equipment and Method: Stylet Placement Confirmation: ETT inserted through vocal cords under direct vision, positive ETCO2 and breath sounds checked- equal and bilateral Secured at: 22 cm Tube secured with: Tape Dental Injury: Teeth and Oropharynx as per pre-operative assessment

## 2023-11-16 MED ORDER — PANTOPRAZOLE SODIUM 40 MG PO TBEC
40.0000 mg | DELAYED_RELEASE_TABLET | Freq: Every day | ORAL | Status: DC
  Administered 2023-11-16: 40 mg via ORAL
  Filled 2023-11-16: qty 1

## 2023-11-16 MED ORDER — DEXAMETHASONE SODIUM PHOSPHATE 10 MG/ML IJ SOLN
10.0000 mg | Freq: Four times a day (QID) | INTRAMUSCULAR | Status: AC
  Administered 2023-11-16 (×3): 10 mg via INTRAVENOUS
  Filled 2023-11-16 (×3): qty 1

## 2023-11-16 MED FILL — Thrombin (Recombinant) For Soln 20000 Unit: CUTANEOUS | Qty: 1 | Status: AC

## 2023-11-16 NOTE — Evaluation (Signed)
 Occupational Therapy Evaluation Patient Details Name: Miguel Williams MRN: [id number] DOB: 1945-07-25 Today's Date: 11/16/2023   History of Present Illness   Pt is a 78 yo man with progressive back pain presenting to [facility] on 11/15/23 for elective PLIF L4-S1 with decompressive laminectomies. PMH of anxiety, cancer, CKD III, HLD, HTN, hypothyroidism     Clinical Impressions Pt admitted for above, PTA pt reports being ind with his ADLs and mobility only recently needing DME such as the [facility]. At home pt has a [facility] that stays with him 24/7. Pt currently presenting with impaired activity tolerance and balance with prolonged mobility, needing min A to CGA with RW use, LLE buckling & dragging with fatigue. Pt also needs max A to setup A for ADLs, more assist needed with LB ADLs. Pt expressed interest in regaininig his independence in functional activities and would be open to AE. Discussed with pt alt strategies for wiping/toileting hygiene. OT to continue following pt acutely to progress pt as able and address listed deficits. Patient has the potential to reach Mod I and demos the ability to tolerate 3 hours of therapy. Pt would benefit from an intensive rehab program to help maximize functional independence.      If plan is discharge home, recommend the following:   A little help with walking and/or transfers;A lot of help with bathing/dressing/bathroom;Assistance with cooking/housework;Assist for transportation;Direct supervision/assist for medications management     Functional Status Assessment   Patient has had a recent decline in their functional status and demonstrates the ability to make significant improvements in function in a reasonable and predictable amount of time.     Equipment Recommendations   BSC/3in1     Recommendations for Other Services   Rehab consult     Precautions/Restrictions   Precautions Precautions: Back Precaution Booklet Issued: Yes (comment) Recall of  Precautions/Restrictions: Impaired Precaution/Restrictions Comments: Review back precautions with pt Required Braces or Orthoses: Spinal Brace Spinal Brace: Lumbar corset;Applied in sitting position Restrictions Weight Bearing Restrictions Per Provider Order: No     Mobility Bed Mobility Overal bed mobility: Needs Assistance Bed Mobility: Rolling, Sidelying to Sit, Sit to Supine Rolling: Used rails, Contact guard assist Sidelying to sit: Contact guard assist, Used rails   Sit to supine: Used rails, Contact guard assist   General bed mobility comments: Pt demonstrated ability to perform tasks without physical assist but needed several cues to sequence mobility, multiple attempts. Will continue to re-educate next session    Transfers Overall transfer level: Needs assistance Equipment used: Rolling walker (2 wheels) Transfers: Sit to/from Stand Sit to Stand: Contact guard assist           General transfer comment: cues for hand placement with transition.      Balance Overall balance assessment: Needs assistance Sitting-balance support: Feet supported, No upper extremity supported Sitting balance-Leahy Scale: Fair     Standing balance support: Bilateral upper extremity supported, During functional activity, Reliant on assistive device for balance Standing balance-Leahy Scale: Poor Standing balance comment: reliant on ext support.                           ADL either performed or assessed with clinical judgement   ADL Overall ADL's : Needs assistance/impaired Eating/Feeding: Independent;Sitting   Grooming: Sitting;Set up Grooming Details (indicate cue type and reason): Educated pt on compensatory sink strategies. Upper Body Bathing: Sitting;Set up   Lower Body Bathing: Sitting/lateral leans;Moderate assistance   Upper Body Dressing :  Sitting;Set up   Lower Body Dressing: Maximal assistance;Sitting/lateral leans Lower Body Dressing Details (indicate cue  type and reason): pt not able to don socks despite achieving figure four. pt is open to AE for independence Toilet Transfer: Rolling walker (2 wheels);Minimal assistance           Functional mobility during ADLs: Contact guard assist;Minimal assistance;Rolling walker (2 wheels) (Min A to CGA, needs more assist with muscular fatigue)       Vision   Vision Assessment?: No apparent visual deficits     Perception         Praxis         Pertinent Vitals/Pain Pain Assessment Pain Assessment: Faces Faces Pain Scale: Hurts little more Pain Location: back, op site Pain Descriptors / Indicators: Aching, Discomfort, Sore Pain Intervention(s): Monitored during session, Limited activity within patient's tolerance, Repositioned     Extremity/Trunk Assessment Upper Extremity Assessment Upper Extremity Assessment: Overall WFL for tasks assessed   Lower Extremity Assessment Lower Extremity Assessment: Defer to PT evaluation   Cervical / Trunk Assessment Cervical / Trunk Assessment: Back Surgery   Communication Communication Communication: Impaired Factors Affecting Communication: Hearing impaired   Cognition Arousal: Alert Behavior During Therapy: WFL for tasks assessed/performed Cognition: Cognition impaired         Attention impairment (select first level of impairment): Alternating attention, Divided attention Executive functioning impairment (select all impairments): Problem solving, Sequencing OT - Cognition Comments: pt with increased challenge sequencing bed mobility. Discussed with pt no BLT to maintain back precautions and he decided to demonstrate a slight bend. Reinforced no BLT with pt.                 Following commands: Impaired Following commands impaired: Follows one step commands with increased time     Cueing  General Comments   Cueing Techniques: Verbal cues;Gestural cues;Tactile cues  incision c/d/i   Exercises     Shoulder Instructions       Home Living Family/patient expects to be discharged to:: Private residence Living Arrangements: Other (Comment) (has Home nurse that resides there) Available Help at Discharge: Home health;Available 24 hours/day (Home RN) Type of Home: House Home Access: Stairs to enter [employer] of Steps: 3 Entrance Stairs-Rails: Left;Right;Can reach both Home Layout: Able to live on main level with bedroom/bathroom;Two level Alternate Level Stairs-Number of Steps: n/a   Bathroom Shower/Tub: Producer, television/film/video: Standard     Home Equipment: Pharmacist, hospital (2 wheels);Cane - single point   Additional Comments: 1 fall in feb      Prior Functioning/Environment Prior Level of Function : Independent/Modified Independent;History of Falls (last six months)               ADLs Comments: ind    OT Problem List: Impaired balance (sitting and/or standing);Decreased cognition;Decreased knowledge of precautions;Decreased knowledge of use of DME or AE;Pain   OT Treatment/Interventions: Self-care/ADL training;Therapeutic exercise;Patient/family education;Balance training;Therapeutic activities;DME and/or AE instruction      OT Goals(Current goals can be found in the care plan section)   Acute Rehab OT Goals Patient Stated Goal: To get better; make a good recovery OT Goal Formulation: With patient Time For Goal Achievement: 11/30/23 Potential to Achieve Goals: Good   OT Frequency:  Min 2X/week    Co-evaluation PT/OT/SLP Co-Evaluation/Treatment: Yes Reason for Co-Treatment: For patient/therapist safety;To address functional/ADL transfers PT goals addressed during session: Mobility/safety with mobility;Strengthening/ROM;Proper use of DME;Balance OT goals addressed during session: ADL's and self-care;Proper use of  Adaptive equipment and DME      AM-PAC OT 6 Clicks Daily Activity     Outcome Measure Help from another person eating meals?: None Help  from another person taking care of personal grooming?: A Little Help from another person toileting, which includes using toliet, bedpan, or urinal?: A Little Help from another person bathing (including washing, rinsing, drying)?: A Lot Help from another person to put on and taking off regular upper body clothing?: A Little Help from another person to put on and taking off regular lower body clothing?: A Lot 6 Click Score: 17   End of Session Equipment Utilized During Treatment: Gait belt;Rolling walker (2 wheels);Back brace Nurse Communication: Mobility status;Precautions (back precautions)  Activity Tolerance: Patient tolerated treatment well Patient left: in bed;with call bell/phone within reach;with bed alarm set  OT Visit Diagnosis: Unsteadiness on feet (R26.81);Other abnormalities of gait and mobility (R26.89);History of falling (Z91.81);Pain Pain - part of body:  (back)                Time: 9057-8979 OT Time Calculation (min): 38 min Charges:  OT General Charges $OT Visit: 1 Visit OT Evaluation $OT Eval Moderate Complexity: 1 Mod  11/16/2023  AB, OTR/L  Acute Rehabilitation Services  Office: [phone]   Curtistine JONETTA Das 11/16/2023, 11:18 AM

## 2023-11-16 NOTE — Progress Notes (Signed)
 Inpatient Rehab Admissions Coordinator:   Consult received and chart reviewed.  Pt admitted for elective PLIF  L4-S1.  Post operatively NS notes 5/5 strength in LEs.  Some buckling/reduced endurance noted in LEs with therapy.  Overall min assist for household level ambulation.  I do not believe that [facility] Medicare will approve CIR for this diagnosis/presentation.  Recommend TOC pursue other rehab options.    Reche Lowers, PT, DPT Admissions Coordinator [phone] 11/16/23  12:03 PM

## 2023-11-16 NOTE — Evaluation (Signed)
 Physical Therapy Evaluation  Patient Details Name: Miguel Williams MRN: [id number] DOB: March 28, 1946 Today's Date: 11/16/2023  History of Present Illness  Pt is a 78 yo man with progressive back pain presenting to [facility] on 11/15/23 for elective PLIF L4-S1 with decompressive laminectomies. PMH of anxiety, cancer, CKD III, HTN, hypothyroidism  Clinical Impression  Pt admitted with above diagnosis. At the time of PT eval, pt was able to demonstrate transfers and ambulation with gross min assist, RW, and +2 for safety. Pt with R knee buckling throughout session, and noted decreased muscular endurance with OOB mobility including toe dragging to advance RLE with prolonged gait training. Pt reports he was not having any knee buckling prior to surgery. Pt was educated on precautions, brace application/wearing schedule, appropriate activity progression. Recommend continued skilled therapy intervention at d/c to maximize functional return and facilitate a safe return home. Pt currently with functional limitations due to the deficits listed below (see PT Problem List). Pt will benefit from skilled PT to increase their independence and safety with mobility to allow discharge to the venue listed below.          If plan is discharge home, recommend the following: A little help with walking and/or transfers;A little help with bathing/dressing/bathroom;Assistance with cooking/housework;Assist for transportation;Help with stairs or ramp for entrance   Can travel by private vehicle        Equipment Recommendations None recommended by PT  Recommendations for Other Services  Rehab consult    Functional Status Assessment Patient has had a recent decline in their functional status and demonstrates the ability to make significant improvements in function in a reasonable and predictable amount of time.     Precautions / Restrictions Precautions Precautions: Back Precaution Booklet Issued: Yes (comment) Recall of  Precautions/Restrictions: Impaired Precaution/Restrictions Comments: Reviewed precautions throughout functional mobility Required Braces or Orthoses: Spinal Brace Spinal Brace: Lumbar corset;Applied in sitting position Restrictions Weight Bearing Restrictions Per Provider Order: No      Mobility  Bed Mobility Overal bed mobility: Needs Assistance Bed Mobility: Rolling, Sidelying to Sit, Sit to Supine Rolling: Used rails, Contact guard assist Sidelying to sit: Contact guard assist, Used rails   Sit to supine: Used rails, Contact guard assist   General bed mobility comments: Pt demonstrated ability to perform tasks without physical assist but needed several cues to sequence log roll. Multiple attempts with pt appearing confused when cued for sequencing.    Transfers Overall transfer level: Needs assistance Equipment used: Rolling walker (2 wheels) Transfers: Sit to/from Stand, Bed to chair/wheelchair/BSC Sit to Stand: Min assist   Step pivot transfers: Min assist       General transfer comment: VC's for hand placement on seated surface for safety. At end of session pt with R knee buckle and posterior LOB. Assist provided for recovery.    Ambulation/Gait Ambulation/Gait assistance: Min assist, +2 safety/equipment Gait Distance (Feet): 80 Feet (15'+80') Assistive device: Rolling walker (2 wheels) Gait Pattern/deviations: Step-to pattern, Step-through pattern, Decreased stride length, Knees buckling, Trunk flexed, Narrow base of support Gait velocity: Decreased Gait velocity interpretation: 1.31 - 2.62 ft/sec, indicative of limited community ambulator   General Gait Details: VC's for improved posture, closer walker proximity and forward gaze. Initially with several instances of R knee buckle when ambulating to the bathroom. Improved stability of RLE during gait training after, however as pt fatigued, noted R DF suffered and pt dragging toes to advance RLE. At end of session pt  with larger amplitude knee buckle and LOB  when transferring back to the bed.  Stairs            Wheelchair Mobility     Tilt Bed    Modified Rankin (Stroke Patients Only)       Balance Overall balance assessment: Needs assistance Sitting-balance support: Feet supported, No upper extremity supported Sitting balance-Leahy Scale: Fair     Standing balance support: Bilateral upper extremity supported, During functional activity, Reliant on assistive device for balance Standing balance-Leahy Scale: Poor Standing balance comment: reliant on ext support.                             Pertinent Vitals/Pain Pain Assessment Pain Assessment: Faces Faces Pain Scale: Hurts little more Pain Location: back, op site Pain Descriptors / Indicators: Aching, Discomfort, Sore Pain Intervention(s): Limited activity within patient's tolerance, Monitored during session, Repositioned    Home Living Family/patient expects to be discharged to:: Private residence Living Arrangements: Other (Comment) (has Home nurse that resides there - staff reports this is girlfriend but pt reports she is a hired Engineer, civil (consulting).) Available Help at Discharge: Home health;Available 24 hours/day (Home RN) Type of Home: House Home Access: Stairs to enter Entrance Stairs-Rails: Left;Right;Can reach both Entrance Stairs-Number of Steps: 2-3 Alternate Level Stairs-Number of Steps: n/a Home Layout: Able to live on main level with bedroom/bathroom;Two level Home Equipment: Pharmacist, hospital (2 wheels);Cane - single point Additional Comments: 1 fall in feb    Prior Function Prior Level of Function : Independent/Modified Independent;History of Falls (last six months)             Mobility Comments: uses cane mostly but has RW available when needed. Pt reports 1 fall in February ADLs Comments: ind     Extremity/Trunk Assessment   Upper Extremity Assessment Upper Extremity Assessment: Defer to OT  evaluation    Lower Extremity Assessment Lower Extremity Assessment: RLE deficits/detail;LLE deficits/detail RLE Deficits / Details: Grossly 4-/5 strength in quads, hamstrings, hip flexors, ankle DF. Increased muscle fatigue with activity compared to the L. LLE Deficits / Details: Grossly 4/5 to 4-/5 strength in quads, hamstrings, hip flexors, and ankle DF.    Cervical / Trunk Assessment Cervical / Trunk Assessment: Back Surgery  Communication   Communication Communication: Impaired Factors Affecting Communication: Hearing impaired    Cognition Arousal: Alert Behavior During Therapy: WFL for tasks assessed/performed   PT - Cognitive impairments: No apparent impairments                         Following commands: Impaired Following commands impaired: Follows one step commands with increased time     Cueing Cueing Techniques: Verbal cues, Gestural cues, Tactile cues     General Comments General comments (skin integrity, edema, etc.): incision c/d/i    Exercises     Assessment/Plan    PT Assessment Patient needs continued PT services  PT Problem List Decreased strength;Decreased activity tolerance;Decreased balance;Decreased mobility;Decreased knowledge of use of DME;Decreased safety awareness;Decreased knowledge of precautions;Pain       PT Treatment Interventions DME instruction;Gait training;Stair training;Functional mobility training;Therapeutic activities;Therapeutic exercise;Balance training;Patient/family education;Neuromuscular re-education    PT Goals (Current goals can be found in the Care Plan section)  Acute Rehab PT Goals Patient Stated Goal: Be able to return home PT Goal Formulation: With patient Time For Goal Achievement: 11/30/23 Potential to Achieve Goals: Good    Frequency Min 5X/week     Co-evaluation PT/OT/SLP Co-Evaluation/Treatment: Yes  Reason for Co-Treatment: For patient/therapist safety;To address functional/ADL transfers PT  goals addressed during session: Mobility/safety with mobility;Strengthening/ROM;Proper use of DME;Balance OT goals addressed during session: ADL's and self-care;Proper use of Adaptive equipment and DME       AM-PAC PT 6 Clicks Mobility  Outcome Measure Help needed turning from your back to your side while in a flat bed without using bedrails?: A Little Help needed moving from lying on your back to sitting on the side of a flat bed without using bedrails?: A Little Help needed moving to and from a bed to a chair (including a wheelchair)?: A Little Help needed standing up from a chair using your arms (e.g., wheelchair or bedside chair)?: A Little Help needed to walk in hospital room?: A Little Help needed climbing 3-5 steps with a railing? : A Little 6 Click Score: 18    End of Session Equipment Utilized During Treatment: Gait belt;Back brace Activity Tolerance: Patient tolerated treatment well Patient left: in bed;with call bell/phone within reach;with bed alarm set Nurse Communication: Mobility status PT Visit Diagnosis: Unsteadiness on feet (R26.81);Pain Pain - part of body:  (back)    Time: 9060-8977 PT Time Calculation (min) (ACUTE ONLY): 43 min   Charges:   PT Evaluation $PT Eval Moderate Complexity: 1 Mod PT Treatments $Gait Training: 8-22 mins PT General Charges $$ ACUTE PT VISIT: 1 Visit         Leita Sable, PT, DPT Acute Rehabilitation Services Secure Chat Preferred Office: [phone]   Leita JONETTA Sable 11/16/2023, 11:52 AM

## 2023-11-16 NOTE — Progress Notes (Signed)
 Subjective: Patient reports condition of back pain no leg pain  Objective: Vital signs in last 24 hours: Temp:  [97.7 F (36.5 C)-99.4 F (37.4 C)] 98.1 F (36.7 C) (07/24 0355) Pulse Rate:  [66-109] 88 (07/24 0355) Resp:  [10-20] 18 (07/24 0355) BP: (106-148)/(64-96) 148/91 (07/24 0355) SpO2:  [90 %-100 %] 98 % (07/24 0355)  Intake/Output from previous day: 07/23 0701 - 07/24 0700 In: 3179.1 [P.O.:960; I.V.:1794.1; Blood:125; IV Piggyback:300] Out: 2950 [Urine:2150; Drains:600; Blood:200] Intake/Output this shift: No intake/output data recorded.  Awake alert strength 5 out of 5 lower extremities incision clean dry and intact  Lab Results: No results for input(s): WBC, HGB, HCT, PLT in the last 72 hours. BMET No results for input(s): NA, K, CL, CO2, GLUCOSE, BUN, CREATININE, CALCIUM  in the last 72 hours.  Studies/Results: DG Lumbar Spine 2-3 Views Result Date: 11/15/2023 CLINICAL DATA:  Posterolateral and interbody fusion L4-5 and L5-S1. EXAM: LUMBAR SPINE - 2-3 VIEW COMPARISON:  Lumbar spine 11/09/2023 and MR lumbar spine 09/14/2023. FINDINGS: Two intraoperative fluoroscopic spot views of the lumbar spine are provided in the AP and lateral projections. Number system utilized on 09/14/2023 is preserved. Pedicle screws at L4, L5 and S1 with interbody spacers. IMPRESSION: Intraoperative visualization for L4-S1 posterior interbody fusion. Electronically Signed   By: Newell Eke M.D.   On: 11/15/2023 13:21   DG C-Arm 1-60 Min-No Report Result Date: 11/15/2023 Fluoroscopy was utilized by the requesting physician.  No radiographic interpretation.   DG C-Arm 1-60 Min-No Report Result Date: 11/15/2023 Fluoroscopy was utilized by the requesting physician.  No radiographic interpretation.   DG C-Arm 1-60 Min-No Report Result Date: 11/15/2023 Fluoroscopy was utilized by the requesting physician.  No radiographic interpretation.    Assessment/Plan: Mobilize  today with physical therapy patient will need to avoid will consider discharge possible today or tomorrow  LOS: 1 day     Miguel Williams 11/16/2023, 8:06 AM

## 2023-11-17 MED ORDER — HYDROCODONE-ACETAMINOPHEN 5-325 MG PO TABS: 2.0000 | ORAL_TABLET | ORAL | 0 refills | Status: DC | PRN

## 2023-11-17 NOTE — Discharge Summary (Signed)
 Physician Discharge Summary  Patient ID: Miguel Williams MRN: [id number] DOB/AGE: 79/10/1945 79 y.o. Estimated body mass index is 30.56 kg/m as calculated from the following:   Height as of this encounter: 5' 8 (1.727 m).   Weight as of this encounter: 91.2 kg.   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  Patient will be discharged schedule follow-up in 1 to 2 weeks.  Consults: Significant Diagnostic Studies: Treatments: Decompressive laminectomy interbody fusion L4-5 L5-S1 Discharge Exam: Blood pressure 137/78, pulse 87, temperature 97.8 F (36.6 C), temperature source Oral, resp. rate 20, height 5' 8 (1.727 m), weight 91.2 kg, SpO2 96%. Strength 5 out of 5 and clean dry and intact  Disposition: Home   Allergies as of 11/17/2023       Reactions   Allopurinol Other (See Comments)   Had a bad reaction   Bacitracin-polymyxin B Hives   Baclofen Other (See Comments)   Hallucinations- required admit to hospital for adverse drug rxn.   Neomycin Other (See Comments)   Allergic skin reaction   Tegretol  [carbamazepine ] Hives   Sulfa Antibiotics Hives, Rash        Medication List     TAKE these medications    acetaminophen  500 MG tablet Commonly known as: TYLENOL  Take 1 tablet (500 mg total) by mouth every 6 (six) hours as needed for mild pain, moderate pain or fever.   atorvastatin  40 MG tablet Commonly known as: LIPITOR Take 40 mg by mouth in the morning.   betamethasone dipropionate 0.05 % cream Apply  1 Application topically 2 (two) times daily as needed (skin irritation.).   calcium -vitamin D 500-5 MG-MCG tablet Commonly known as: OSCAL WITH D Take 1 tablet by mouth.   Clobetasol  Propionate E 0.05 % emollient cream Generic drug: Clobetasol  Prop Emollient Base Apply 1 Application topically daily as needed (irritation).   clonazePAM  0.5 MG tablet Commonly known as: KLONOPIN  Take 0.5 mg by mouth at bedtime as needed (sleep/anxiety).   colchicine  0.6 MG tablet Take 0.6 mg by mouth in the morning.   cyanocobalamin  500 MCG tablet Commonly known as: VITAMIN B12 Take 500 mcg by mouth daily.   DULoxetine  60 MG capsule Commonly known as: CYMBALTA  Take 60 mg by mouth in the morning.   dutasteride  0.5 MG capsule Commonly known as: Avodart  Take 1 capsule (0.5 mg total) by mouth every other day. What changed: when to take this   ezetimibe  10 MG tablet Commonly known as: ZETIA  Take 10 mg by mouth in the morning.   ferrous sulfate  325 (65 FE) MG EC tablet Take 325 mg by mouth 3 (three) times daily with meals.   HYDROcodone -acetaminophen  5-325 MG tablet Commonly known as: NORCO/VICODIN Take 2 tablets by mouth every 4 (four) hours as needed for severe pain (pain score 7-10).   lansoprazole 30 MG capsule Commonly known as: PREVACID Take 30 mg by mouth in the morning and at bedtime.   levothyroxine  50 MCG tablet Commonly known as: SYNTHROID  Take 50-100 mcg by mouth See admin instructions. Take 2 tablets (100 mcg) by mouth in the morning on Sundays.  Take 1 tablet (50  mcg) by mouth in the morning on Mondays, Tuesdays, Wednesdays, Thursdays, Fridays & Saturdays.   loperamide  2 MG tablet Commonly known as: IMODIUM  A-D Take 2 mg by mouth 4 (four) times daily as needed for diarrhea or loose stools.   probenecid  500 MG tablet Commonly known as: BENEMID  Take 1 tablet (500 mg total) by mouth daily.   Systane Complete PF 0.6 % Soln Generic drug: Propylene Glycol (PF) Place 1 drop  into both eyes 4 (four) times daily as needed (for dryness).   tamsulosin  0.4 MG Caps capsule Commonly known as: FLOMAX  TAKE 1 CAPSULE (0.4 MG TOTAL) BY MOUTH DAILY.   thiamine  100 MG tablet Commonly known as: Vitamin B-1 Take 1 tablet (100 mg total) by mouth daily.   tiZANidine  2 MG tablet Commonly known as: ZANAFLEX  Take 2 mg by mouth every 6 (six) hours as needed for muscle spasms.   traMADol  50 MG tablet Commonly known as: ULTRAM  Take 50 mg by mouth every 6 (six) hours as needed (pain.).   valsartan 80 MG tablet Commonly known as: DIOVAN Take 80 mg by mouth at bedtime.         Signed: Arley SHAUNNA Helling 11/17/2023, 8:29 AM

## 2023-11-17 NOTE — Plan of Care (Signed)
 Pt doing well. Pt given D/C instructions with verbal understanding. Rx's were sent to the pharmacy by MD. Pt's incision is clean and dry with no sign of infection. Pt's IV and Hemovac were removed prior to D/C. Pt D/C'd home via wheelchair per MD order. Pt is stable @ D/C and has has no other needs at this time. Rosina Rakers, RN

## 2023-11-17 NOTE — Progress Notes (Signed)
 Occupational Therapy Treatment Patient Details Name: Miguel Williams MRN: [id number] DOB: [birth year] Today's Date: 11/17/2023   History of present illness Pt is a 78 yo man with progressive back pain presenting to [facility] on 11/15/23 for elective PLIF L4-S1 with decompressive laminectomies. PMH of anxiety, cancer, CKD III, HTN, hypothyroidism   OT comments  Pt continues to need cues to complete functional tasks while adhering to back precautions, had to provide verbal and gestural reminders to pt several times throughout session to adhere to back precautions. Over time pt was able to catch himself in the acts of breaking his precautions, benefit from errorless learning strategies to maintain them. Educated pt on AE for LBD/LBB, needed mod A to CGA for LB ADLs and demonstrated ability to stand with only CGA today. OT to continue following pt to progress as able. Patient would benefit from post acute Home OT services to help maximize functional independence in natural environment       If plan is discharge home, recommend the following:  A little help with walking and/or transfers;A lot of help with bathing/dressing/bathroom;Assistance with cooking/housework;Assist for transportation;Direct supervision/assist for medications management   Equipment Recommendations  BSC/3in1    Recommendations for Other Services      Precautions / Restrictions Precautions Precautions: Back Precaution Booklet Issued: Yes (comment) Recall of Precautions/Restrictions: Impaired Precaution/Restrictions Comments: needs cues to maintain during activity Required Braces or Orthoses: Spinal Brace Spinal Brace: Lumbar corset;Applied in sitting position Restrictions Weight Bearing Restrictions Per Provider Order: No       Mobility Bed Mobility Overal bed mobility: Needs Assistance Bed Mobility: Rolling, Sidelying to Sit, Sit to Sidelying Rolling: Used rails, Supervision Sidelying to sit: Used rails, Supervision        General bed mobility comments: log rolling with supervision, no cues needed.    Transfers Overall transfer level: Needs assistance Equipment used: Rolling walker (2 wheels) Transfers: Sit to/from Stand Sit to Stand: Contact guard assist           General transfer comment: STS x3 from EOB with CGA . Cues for efficient weight shifts.     Balance Overall balance assessment: Needs assistance Sitting-balance support: Feet supported, No upper extremity supported Sitting balance-Leahy Scale: Fair Sitting balance - Comments: sitting EOB   Standing balance support: Bilateral upper extremity supported, During functional activity, Reliant on assistive device for balance Standing balance-Leahy Scale: Poor Standing balance comment: with RW support                           ADL either performed or assessed with clinical judgement   ADL               Lower Body Bathing: Sitting/lateral leans;Set up;With adaptive equipment Lower Body Bathing Details (indicate cue type and reason): with use of AE (LH sponge) Upper Body Dressing : Sitting;Set up   Lower Body Dressing: Sitting/lateral leans;Sit to/from stand;Moderate assistance;Cueing for back precautions Lower Body Dressing Details (indicate cue type and reason): Edcuated pt on the use of sockaid, reacher, shoe horn for LBD. Mod mutli modal cues for learning of equipment and mod A to setup sockaid device. cues for precautions.                    Extremity/Trunk Assessment              Diplomatic Services operational officer Communication Communication:  Impaired Factors Affecting Communication: Hearing impaired   Cognition Arousal: Alert Behavior During Therapy: WFL for tasks assessed/performed Cognition: Cognition impaired     Awareness: Intellectual awareness intact, Online awareness impaired   Attention impairment (select first level of impairment): Alternating attention, Divided  attention Executive functioning impairment (select all impairments): Problem solving, Sequencing OT - Cognition Comments: decreased recall of precautions                 Following commands: Impaired Following commands impaired: Follows one step commands with increased time      Cueing   Cueing Techniques: Verbal cues, Gestural cues, Tactile cues  Exercises      Shoulder Instructions       General Comments incision c/di/i    Pertinent Vitals/ Pain       Pain Assessment Pain Assessment: Faces Faces Pain Scale: Hurts even more Pain Location: back Pain Descriptors / Indicators: Aching, Discomfort, Sore Pain Intervention(s): Monitored during session, Limited activity within patient's tolerance  Home Living                                          Prior Functioning/Environment              Frequency  Min 2X/week        Progress Toward Goals  OT Goals(current goals can now be found in the care plan section)  Progress towards OT goals: Progressing toward goals  Acute Rehab OT Goals Patient Stated Goal: To get better; make a good recovery OT Goal Formulation: With patient Time For Goal Achievement: 11/30/23 Potential to Achieve Goals: Good  Plan      Co-evaluation                 AM-PAC OT 6 Clicks Daily Activity     Outcome Measure   Help from another person eating meals?: None Help from another person taking care of personal grooming?: A Little Help from another person toileting, which includes using toliet, bedpan, or urinal?: A Little Help from another person bathing (including washing, rinsing, drying)?: A Little Help from another person to put on and taking off regular upper body clothing?: A Little Help from another person to put on and taking off regular lower body clothing?: A Lot 6 Click Score: 18    End of Session Equipment Utilized During Treatment: Gait belt;Rolling walker (2 wheels);Back brace  OT Visit  Diagnosis: Unsteadiness on feet (R26.81);Other abnormalities of gait and mobility (R26.89);History of falling (Z91.81);Pain Pain - part of body:  (back)   Activity Tolerance Patient tolerated treatment well   Patient Left in bed;with call bell/phone within reach;with bed alarm set   Nurse Communication Mobility status;Precautions (back precautions.)        Time: 8972-8884 OT Time Calculation (min): 48 min  Charges: OT General Charges $OT Visit: 1 Visit OT Treatments $Self Care/Home Management : 23-37 mins $Therapeutic Activity: 8-22 mins  11/17/2023  AB, OTR/L  Acute Rehabilitation Services  Office: [phone]   Miguel Williams 11/17/2023, 1:16 PM

## 2023-11-17 NOTE — Progress Notes (Signed)
 Physical Therapy Treatment Patient Details Name: Miguel Williams MRN: [id number] DOB: 30-Jun-1945 Today's Date: 11/17/2023   History of Present Illness Pt is a 78 yo man with progressive back pain presenting to [facility] on 11/15/23 for elective PLIF L4-S1 with decompressive laminectomies. PMH of anxiety, cancer, CKD III, HTN, hypothyroidism    PT Comments  Pt received in supine and agreeable to session. Pt does not recall precautions from previous session requiring education and cues throughout session for adherence.  Pt able to tolerate increased gait distance with LE weakness noted, but no buckling. Pt reports L hip drop PTA and is educated on hip abduction exercise to address. Pt demonstrates low foot clearance with feet sliding forward with increased fatigue. Education on reducing fall risk and activity progression provided. Pt continues to benefit from PT services to progress toward functional mobility goals.    If plan is discharge home, recommend the following: A little help with walking and/or transfers;A little help with bathing/dressing/bathroom;Assistance with cooking/housework;Assist for transportation;Help with stairs or ramp for entrance   Can travel by private vehicle        Equipment Recommendations  None recommended by PT    Recommendations for Other Services       Precautions / Restrictions Precautions Precautions: Back Recall of Precautions/Restrictions: Impaired Precaution/Restrictions Comments: Pt unable to recall precautions requiring education at beginning of session Required Braces or Orthoses: Spinal Brace Spinal Brace: Lumbar corset;Applied in sitting position Restrictions Weight Bearing Restrictions Per Provider Order: No     Mobility  Bed Mobility Overal bed mobility: Needs Assistance Bed Mobility: Rolling, Sidelying to Sit, Sit to Sidelying Rolling: Used rails, Supervision Sidelying to sit: Used rails, Supervision     Sit to sidelying: Supervision, Used  rails General bed mobility comments: cues for logroll technique and maintaining precautions throughout    Transfers Overall transfer level: Needs assistance Equipment used: Rolling walker (2 wheels) Transfers: Sit to/from Stand Sit to Stand: Contact guard assist           General transfer comment: cues for hand placement    Ambulation/Gait Ambulation/Gait assistance: Contact guard assist Gait Distance (Feet): 175 Feet Assistive device: Rolling walker (2 wheels) Gait Pattern/deviations: Step-through pattern, Decreased stride length, Trunk flexed, Narrow base of support, Trendelenburg Gait velocity: Decreased     General Gait Details: Pt demonstrates slow gait with reliance on RW due to instability. BLE weakness with L hip drop and low foot clearance with feet tending to slide forward (R>L). Pt able to increase foot clearance briefly with cues   Stairs             Wheelchair Mobility     Tilt Bed    Modified Rankin (Stroke Patients Only)       Balance Overall balance assessment: Needs assistance Sitting-balance support: Feet supported, No upper extremity supported Sitting balance-Leahy Scale: Fair Sitting balance - Comments: sitting EOB   Standing balance support: Bilateral upper extremity supported, During functional activity, Reliant on assistive device for balance Standing balance-Leahy Scale: Poor Standing balance comment: with RW support                            Communication Communication Communication: Impaired Factors Affecting Communication: Hearing impaired  Cognition Arousal: Alert Behavior During Therapy: WFL for tasks assessed/performed   PT - Cognitive impairments: Memory, Awareness                       PT -  Cognition Comments: Increased difficulty with dual tasking and does not recall precautions from previous session Following commands: Impaired Following commands impaired: Follows one step commands with  increased time    Cueing Cueing Techniques: Verbal cues, Gestural cues, Tactile cues  Exercises General Exercises - Lower Extremity Hip ABduction/ADduction: AROM, Supine, Right, 5 reps    General Comments        Pertinent Vitals/Pain Pain Assessment Pain Assessment: Faces Faces Pain Scale: Hurts little more Pain Location: back Pain Descriptors / Indicators: Aching, Discomfort, Sore Pain Intervention(s): Limited activity within patient's tolerance, Monitored during session, Repositioned     PT Goals (current goals can now be found in the care plan section) Acute Rehab PT Goals Patient Stated Goal: Be able to return home PT Goal Formulation: With patient Time For Goal Achievement: 11/30/23 Progress towards PT goals: Progressing toward goals    Frequency    Min 5X/week       AM-PAC PT 6 Clicks Mobility   Outcome Measure  Help needed turning from your back to your side while in a flat bed without using bedrails?: A Little Help needed moving from lying on your back to sitting on the side of a flat bed without using bedrails?: A Little Help needed moving to and from a bed to a chair (including a wheelchair)?: A Little Help needed standing up from a chair using your arms (e.g., wheelchair or bedside chair)?: A Little Help needed to walk in hospital room?: A Little Help needed climbing 3-5 steps with a railing? : A Lot 6 Click Score: 17    End of Session Equipment Utilized During Treatment: Gait belt;Back brace Activity Tolerance: Patient tolerated treatment well Patient left: in bed;with call bell/phone within reach;with bed alarm set Nurse Communication: Mobility status PT Visit Diagnosis: Unsteadiness on feet (R26.81);Pain     Time: 9079-9055 PT Time Calculation (min) (ACUTE ONLY): 24 min  Charges:    $Gait Training: 8-22 mins $Therapeutic Activity: 8-22 mins PT General Charges $$ ACUTE PT VISIT: 1 Visit                     Darryle George, PTA Acute  Rehabilitation Services Secure Chat Preferred  [phone]    Darryle George 11/17/2023, 11:06 AM

## 2023-11-17 NOTE — TOC Transition Note (Signed)
 Transition of Care [facility]) - Discharge Note   Patient Details  Name: Miguel Williams MRN: [id number] Date of Birth: 05/29/1945  Transition of Care [facility] CM/SW Contact:  Andrez JULIANNA George, RN Phone Number: 11/17/2023, 10:28 AM   Clinical Narrative:     Pt is discharging home with [facility] services through [place]. Information on the AVS. Hedda will contact him for the first home visit. Pt has a Charity fundraiser that assists him at home. Pt has transportation home.  Final next level of care: Home w Home Health Services Barriers to Discharge: No Barriers Identified   Patient Goals and CMS Choice   CMS Medicare.gov Compare Post Acute Care list provided to:: Patient Choice offered to / list presented to : Patient      Discharge Placement                       Discharge Plan and Services Additional resources added to the After Visit Summary for                            [facility] Arranged: PT, OT [facility] Agency: [facility] Date [facility] Agency Contacted: 11/17/23   Representative spoke with at [facility] Agency: Darleene  Social Drivers of Health (SDOH) Interventions SDOH Screenings   Food Insecurity: No Food Insecurity (08/10/2023)  Housing: Low Risk  (08/10/2023)  Transportation Needs: No Transportation Needs (08/10/2023)  Utilities: Not At Risk (08/10/2023)  Social Connections: Unknown (08/10/2023)  Tobacco Use: Low Risk  (11/06/2023)     Readmission Risk Interventions     No data to display

## 2023-11-22 ENCOUNTER — Other Ambulatory Visit

## 2023-11-22 ENCOUNTER — Telehealth: Payer: Self-pay | Admitting: Neurology

## 2023-11-22 NOTE — Telephone Encounter (Signed)
 [name] called to let Tat know they had to cancel the MRI of the spine due to him having back surgery. They resch for 12/13/23 as of now.

## 2023-11-24 ENCOUNTER — Telehealth: Payer: Self-pay | Admitting: Neurology

## 2023-11-24 NOTE — Telephone Encounter (Signed)
 Pt called in today and he stated that he just had surgery 10 days ago and he is having pain in his legs and back. Pt is wanting some medication to control the pain he is having.

## 2023-11-30 ENCOUNTER — Encounter (HOSPITAL_COMMUNITY): Payer: Self-pay

## 2023-11-30 ENCOUNTER — Emergency Department (HOSPITAL_COMMUNITY)

## 2023-11-30 ENCOUNTER — Other Ambulatory Visit: Payer: Self-pay

## 2023-11-30 ENCOUNTER — Inpatient Hospital Stay (HOSPITAL_COMMUNITY)
Admission: EM | Admit: 2023-11-30 | Discharge: 2023-12-05 | DRG: 907 | Disposition: A | Discharge status: Rehabilitation Facility | Attending: Neurosurgery | Admitting: Neurosurgery

## 2023-11-30 DIAGNOSIS — Z85828 Personal history of other malignant neoplasm of skin: Secondary | ICD-10-CM

## 2023-11-30 DIAGNOSIS — Z807 Family history of other malignant neoplasms of lymphoid, hematopoietic and related tissues: Secondary | ICD-10-CM

## 2023-11-30 DIAGNOSIS — L7632 Postprocedural hematoma of skin and subcutaneous tissue following other procedure: Secondary | ICD-10-CM | POA: Diagnosis present

## 2023-11-30 DIAGNOSIS — R296 Repeated falls: Secondary | ICD-10-CM | POA: Diagnosis present

## 2023-11-30 DIAGNOSIS — E669 Obesity, unspecified: Secondary | ICD-10-CM | POA: Diagnosis present

## 2023-11-30 DIAGNOSIS — Z8616 Personal history of COVID-19: Secondary | ICD-10-CM

## 2023-11-30 DIAGNOSIS — M25361 Other instability, right knee: Secondary | ICD-10-CM | POA: Diagnosis present

## 2023-11-30 DIAGNOSIS — S343XXA Injury of cauda equina, initial encounter: Secondary | ICD-10-CM | POA: Diagnosis present

## 2023-11-30 DIAGNOSIS — M4807 Spinal stenosis, lumbosacral region: Secondary | ICD-10-CM | POA: Diagnosis present

## 2023-11-30 DIAGNOSIS — Z882 Allergy status to sulfonamides status: Secondary | ICD-10-CM

## 2023-11-30 DIAGNOSIS — Z8701 Personal history of pneumonia (recurrent): Secondary | ICD-10-CM

## 2023-11-30 DIAGNOSIS — G8918 Other acute postprocedural pain: Secondary | ICD-10-CM | POA: Diagnosis present

## 2023-11-30 DIAGNOSIS — Z961 Presence of intraocular lens: Secondary | ICD-10-CM | POA: Diagnosis present

## 2023-11-30 DIAGNOSIS — H9190 Unspecified hearing loss, unspecified ear: Secondary | ICD-10-CM | POA: Diagnosis present

## 2023-11-30 DIAGNOSIS — I129 Hypertensive chronic kidney disease with stage 1 through stage 4 chronic kidney disease, or unspecified chronic kidney disease: Secondary | ICD-10-CM | POA: Diagnosis present

## 2023-11-30 DIAGNOSIS — Z9841 Cataract extraction status, right eye: Secondary | ICD-10-CM

## 2023-11-30 DIAGNOSIS — Z9089 Acquired absence of other organs: Secondary | ICD-10-CM

## 2023-11-30 DIAGNOSIS — Z888 Allergy status to other drugs, medicaments and biological substances status: Secondary | ICD-10-CM

## 2023-11-30 DIAGNOSIS — W19XXXA Unspecified fall, initial encounter: Secondary | ICD-10-CM | POA: Diagnosis present

## 2023-11-30 DIAGNOSIS — M48062 Spinal stenosis, lumbar region with neurogenic claudication: Secondary | ICD-10-CM | POA: Diagnosis present

## 2023-11-30 DIAGNOSIS — Z883 Allergy status to other anti-infective agents status: Secondary | ICD-10-CM

## 2023-11-30 DIAGNOSIS — Z981 Arthrodesis status: Secondary | ICD-10-CM

## 2023-11-30 DIAGNOSIS — G834 Cauda equina syndrome: Secondary | ICD-10-CM | POA: Diagnosis not present

## 2023-11-30 DIAGNOSIS — F419 Anxiety disorder, unspecified: Secondary | ICD-10-CM | POA: Diagnosis present

## 2023-11-30 DIAGNOSIS — Z87442 Personal history of urinary calculi: Secondary | ICD-10-CM

## 2023-11-30 DIAGNOSIS — M1990 Unspecified osteoarthritis, unspecified site: Secondary | ICD-10-CM | POA: Diagnosis present

## 2023-11-30 DIAGNOSIS — Y838 Other surgical procedures as the cause of abnormal reaction of the patient, or of later complication, without mention of misadventure at the time of the procedure: Secondary | ICD-10-CM | POA: Diagnosis present

## 2023-11-30 DIAGNOSIS — R2689 Other abnormalities of gait and mobility: Secondary | ICD-10-CM | POA: Diagnosis present

## 2023-11-30 DIAGNOSIS — E559 Vitamin D deficiency, unspecified: Secondary | ICD-10-CM | POA: Diagnosis present

## 2023-11-30 DIAGNOSIS — Z96642 Presence of left artificial hip joint: Secondary | ICD-10-CM | POA: Diagnosis present

## 2023-11-30 DIAGNOSIS — J4489 Other specified chronic obstructive pulmonary disease: Secondary | ICD-10-CM | POA: Diagnosis present

## 2023-11-30 DIAGNOSIS — Z79899 Other long term (current) drug therapy: Secondary | ICD-10-CM

## 2023-11-30 DIAGNOSIS — N183 Chronic kidney disease, stage 3 unspecified: Secondary | ICD-10-CM | POA: Diagnosis present

## 2023-11-30 DIAGNOSIS — Z8619 Personal history of other infectious and parasitic diseases: Secondary | ICD-10-CM

## 2023-11-30 DIAGNOSIS — G9519 Other vascular myelopathies: Secondary | ICD-10-CM | POA: Diagnosis present

## 2023-11-30 DIAGNOSIS — Z6830 Body mass index (BMI) 30.0-30.9, adult: Secondary | ICD-10-CM

## 2023-11-30 DIAGNOSIS — G9529 Other cord compression: Secondary | ICD-10-CM | POA: Diagnosis present

## 2023-11-30 DIAGNOSIS — Z79890 Hormone replacement therapy (postmenopausal): Secondary | ICD-10-CM

## 2023-11-30 DIAGNOSIS — G9762 Postprocedural hematoma of a nervous system organ or structure following other procedure: Secondary | ICD-10-CM | POA: Diagnosis not present

## 2023-11-30 DIAGNOSIS — D649 Anemia, unspecified: Secondary | ICD-10-CM | POA: Diagnosis present

## 2023-11-30 DIAGNOSIS — Z801 Family history of malignant neoplasm of trachea, bronchus and lung: Secondary | ICD-10-CM

## 2023-11-30 DIAGNOSIS — I2510 Atherosclerotic heart disease of native coronary artery without angina pectoris: Secondary | ICD-10-CM | POA: Diagnosis present

## 2023-11-30 DIAGNOSIS — Z8551 Personal history of malignant neoplasm of bladder: Secondary | ICD-10-CM

## 2023-11-30 DIAGNOSIS — K219 Gastro-esophageal reflux disease without esophagitis: Secondary | ICD-10-CM | POA: Diagnosis present

## 2023-11-30 DIAGNOSIS — G9589 Other specified diseases of spinal cord: Secondary | ICD-10-CM | POA: Diagnosis present

## 2023-11-30 DIAGNOSIS — L7634 Postprocedural seroma of skin and subcutaneous tissue following other procedure: Secondary | ICD-10-CM | POA: Diagnosis present

## 2023-11-30 DIAGNOSIS — K5900 Constipation, unspecified: Secondary | ICD-10-CM | POA: Diagnosis present

## 2023-11-30 DIAGNOSIS — G4701 Insomnia due to medical condition: Secondary | ICD-10-CM | POA: Diagnosis present

## 2023-11-30 DIAGNOSIS — E039 Hypothyroidism, unspecified: Secondary | ICD-10-CM | POA: Diagnosis present

## 2023-11-30 DIAGNOSIS — Z881 Allergy status to other antibiotic agents status: Secondary | ICD-10-CM

## 2023-11-30 DIAGNOSIS — F1010 Alcohol abuse, uncomplicated: Secondary | ICD-10-CM | POA: Diagnosis present

## 2023-11-30 DIAGNOSIS — M109 Gout, unspecified: Secondary | ICD-10-CM | POA: Diagnosis present

## 2023-11-30 DIAGNOSIS — R197 Diarrhea, unspecified: Secondary | ICD-10-CM | POA: Diagnosis present

## 2023-11-30 DIAGNOSIS — Z9842 Cataract extraction status, left eye: Secondary | ICD-10-CM

## 2023-11-30 DIAGNOSIS — K592 Neurogenic bowel, not elsewhere classified: Secondary | ICD-10-CM | POA: Diagnosis present

## 2023-11-30 DIAGNOSIS — Z9889 Other specified postprocedural states: Secondary | ICD-10-CM

## 2023-11-30 DIAGNOSIS — R29898 Other symptoms and signs involving the musculoskeletal system: Principal | ICD-10-CM

## 2023-11-30 DIAGNOSIS — Z9181 History of falling: Secondary | ICD-10-CM

## 2023-11-30 DIAGNOSIS — N400 Enlarged prostate without lower urinary tract symptoms: Secondary | ICD-10-CM | POA: Diagnosis present

## 2023-11-30 LAB — CBC
HCT: 34.9 % — ABNORMAL LOW (ref 39.0–52.0)
Hemoglobin: 11.5 g/dL — ABNORMAL LOW (ref 13.0–17.0)
MCH: 31.4 pg (ref 26.0–34.0)
MCHC: 33 g/dL (ref 30.0–36.0)
MCV: 95.4 fL (ref 80.0–100.0)
Platelets: 278 K/uL (ref 150–400)
RBC: 3.66 MIL/uL — ABNORMAL LOW (ref 4.22–5.81)
RDW: 13.8 % (ref 11.5–15.5)
WBC: 8.7 K/uL (ref 4.0–10.5)
nRBC: 0 % (ref 0.0–0.2)

## 2023-11-30 LAB — COMPREHENSIVE METABOLIC PANEL WITH GFR
ALT: 19 U/L (ref 0–44)
AST: 15 U/L (ref 15–41)
Albumin: 3.4 g/dL — ABNORMAL LOW (ref 3.5–5.0)
Alkaline Phosphatase: 94 U/L (ref 38–126)
Anion gap: 6 (ref 5–15)
BUN: 22 mg/dL (ref 8–23)
CO2: 20 mmol/L — ABNORMAL LOW (ref 22–32)
Calcium: 8.5 mg/dL — ABNORMAL LOW (ref 8.9–10.3)
Chloride: 109 mmol/L (ref 98–111)
Creatinine, Ser: 1.38 mg/dL — ABNORMAL HIGH (ref 0.61–1.24)
GFR, Estimated: 52 mL/min — ABNORMAL LOW (ref >60)
Glucose, Bld: 116 mg/dL — ABNORMAL HIGH (ref 70–99)
Potassium: 4.5 mmol/L (ref 3.5–5.1)
Sodium: 135 mmol/L (ref 135–145)
Total Bilirubin: 0.6 mg/dL (ref 0.0–1.2)
Total Protein: 6 g/dL — ABNORMAL LOW (ref 6.5–8.1)

## 2023-11-30 LAB — CK: Total CK: 35 U/L — ABNORMAL LOW (ref 49–397)

## 2023-11-30 MED ORDER — GADOBUTROL 1 MMOL/ML IV SOLN
9.0000 mL | Freq: Once | INTRAVENOUS | Status: AC | PRN
  Administered 2023-11-30: 9 mL via INTRAVENOUS

## 2023-11-30 MED ORDER — OXYCODONE-ACETAMINOPHEN 5-325 MG PO TABS
1.0000 | ORAL_TABLET | ORAL | Status: DC | PRN
  Administered 2023-11-30: 1 via ORAL
  Filled 2023-11-30: qty 1

## 2023-11-30 MED ORDER — DIAZEPAM 5 MG/ML IJ SOLN
2.5000 mg | Freq: Once | INTRAMUSCULAR | Status: AC
Start: 2023-11-30 — End: 2023-11-30
  Administered 2023-11-30: 2.5 mg via INTRAVENOUS
  Filled 2023-11-30: qty 2

## 2023-11-30 MED ORDER — HYDROMORPHONE HCL 1 MG/ML IJ SOLN
0.5000 mg | Freq: Once | INTRAMUSCULAR | Status: AC
  Administered 2023-11-30: 0.5 mg via INTRAVENOUS
  Filled 2023-11-30: qty 1

## 2023-11-30 NOTE — ED Provider Triage Note (Signed)
 Emergency Medicine Provider Triage Evaluation Note  Miguel Williams , a 78 y.o. male  was evaluated in triage.  Pt complains of complains of bilateral lower extremity pain and weakness.  He has a history of neurogenic claudication and spinal stenosis with a recent lumbar fusion surgery with Dr. [name] at the end of July.  He states that his back pain is improved he is not having severe lower extremity pain pain which is new and feels like his gait instability is far worse.SABRA  He denies fevers chills saddle anesthesia bowel or bladder incontinence  Review of Systems  Positive: Weakness Negative: Fever  Physical Exam  BP (!) 148/97 (BP Location: Right Arm)   Pulse (!) 106   Temp 98.8 F (37.1 C) (Oral)   Resp 16   Ht 5' 8 (1.727 m)   Wt 88.5 kg   SpO2 94%   BMI 29.65 kg/m  Gen:   Awake, no distress   Resp:  Normal effort  MSK:   Moves extremities without difficulty  Other:  Hyperreflexia at the knee laterally, normal strength with dorsi and plantarflexion at the ankle  Medical Decision Making  Medically screening exam initiated at 7:32 PM.  Appropriate orders placed.  Miguel Williams was informed that the remainder of the evaluation will be completed by another provider, this initial triage assessment does not replace that evaluation, and the importance of remaining in the ED until their evaluation is complete.     Arloa Chroman, PA-C 11/30/23 1934

## 2023-11-30 NOTE — ED Provider Notes (Signed)
 [place] EMERGENCY DEPARTMENT AT [facility] Provider Note   CSN: [id number] Arrival date & time: 11/30/23  1711     Patient presents with: Fall and Back Pain   Miguel Williams is a 78 y.o. male.   78 yo M with a chief complaint of low back pain and weakness.  The patient states that he has been having trouble with bilateral leg pain ever since he had a neurosurgical procedure about 2 weeks ago.  He is unfortunately had a few falls.  He has not yet seen his neurosurgeon in follow-up.  Friends came to visit him and they were worried and brought him here for evaluation.  He says that he has trouble with constipation alternating with diarrhea.  He does not think that significantly changed.  Denies loss of bowel or bladder.  Does feel like both of his legs are weak but does not feel like they are numb.   Fall  Back Pain      Prior to Admission medications   Medication Sig Start Date End Date Taking? Authorizing Provider  acetaminophen  (TYLENOL ) 500 MG tablet Take 1 tablet (500 mg total) by mouth every 6 (six) hours as needed for mild pain, moderate pain or fever. 04/07/19   Segal, Jared E, MD  atorvastatin  (LIPITOR) 40 MG tablet Take 40 mg by mouth in the morning.    [provider]  betamethasone dipropionate 0.05 % cream Apply 1 Application topically 2 (two) times daily as needed (skin irritation.).    [provider]  calcium -vitamin D (OSCAL WITH D) 500-5 MG-MCG tablet Take 1 tablet by mouth.    [provider]  CLOBETASOL  PROPIONATE E 0.05 % emollient cream Apply 1 Application topically daily as needed (irritation). 10/21/21   [provider]  clonazePAM  (KLONOPIN ) 0.5 MG tablet Take 0.5 mg by mouth at bedtime as needed (sleep/anxiety). 10/21/21   [provider]  colchicine  0.6 MG tablet Take 0.6 mg by mouth in the morning.    [provider]  cyanocobalamin  (VITAMIN B12) 500 MCG tablet Take 500 mcg by mouth daily.     [provider]  DULoxetine  (CYMBALTA ) 60 MG capsule Take 60 mg by mouth in the morning. 03/18/19   [provider]  dutasteride  (AVODART ) 0.5 MG capsule Take 1 capsule (0.5 mg total) by mouth every other day. Patient taking differently: Take 0.5 mg by mouth every evening. 02/12/13   Harvey Seltzer, MD  ezetimibe  (ZETIA ) 10 MG tablet Take 10 mg by mouth in the morning. 11/01/21   [provider]  ferrous sulfate  325 (65 FE) MG EC tablet Take 325 mg by mouth 3 (three) times daily with meals.    [provider]  HYDROcodone -acetaminophen  (NORCO/VICODIN) 5-325 MG tablet Take 2 tablets by mouth every 4 (four) hours as needed for severe pain (pain score 7-10). 11/17/23   [name], MD  lansoprazole (PREVACID) 30 MG capsule Take 30 mg by mouth in the morning and at bedtime.    [provider]  levothyroxine  (SYNTHROID ) 50 MCG tablet Take 50-100 mcg by mouth See admin instructions. Take 2 tablets (100 mcg) by mouth in the morning on Sundays.  Take 1 tablet (50 mcg) by mouth in the morning on Mondays, Tuesdays, Wednesdays, Thursdays, Fridays & Saturdays. 12/21/21   [provider]  loperamide  (IMODIUM  A-D) 2 MG tablet Take 2 mg by mouth 4 (four) times daily as needed for diarrhea or loose stools.    [provider]  probenecid  (  BENEMID ) 500 MG tablet Take 1 tablet (500 mg total) by mouth daily. 02/12/13   Harvey Seltzer, MD  SYSTANE COMPLETE PF 0.6 % SOLN Place 1 drop into both eyes 4 (four) times daily as needed (for dryness).    [provider]  Tamsulosin  HCl (FLOMAX ) 0.4 MG CAPS TAKE 1 CAPSULE (0.4 MG TOTAL) BY MOUTH DAILY. 04/04/12   Harvey Seltzer, MD  thiamine  (VITAMIN B-1) 100 MG tablet Take 1 tablet (100 mg total) by mouth daily. 08/12/23   [name], [name], MD  tiZANidine  (ZANAFLEX ) 2 MG tablet Take 2 mg by mouth every 6 (six) hours as needed for muscle spasms.    [provider]  traMADol  (ULTRAM ) 50 MG tablet Take 50  mg by mouth every 6 (six) hours as needed (pain.).    [provider]  valsartan (DIOVAN) 80 MG tablet Take 80 mg by mouth at bedtime. 03/19/19   [provider]    Allergies: Allopurinol, Bacitracin-polymyxin b, Baclofen, Neomycin, Tegretol  [carbamazepine ], and Sulfa antibiotics    Review of Systems  Musculoskeletal:  Positive for back pain.    Updated Vital Signs BP (!) 150/88 (BP Location: Right Arm)   Pulse 94   Temp 98.8 F (37.1 C) (Oral)   Resp 18   Ht 5' 8 (1.727 m)   Wt 88.5 kg   SpO2 98%   BMI 29.65 kg/m   Physical Exam Vitals and nursing note reviewed.  Constitutional:      Appearance: He is well-developed.  HENT:     Head: Normocephalic and atraumatic.  Eyes:     Pupils: Pupils are equal, round, and reactive to light.  Neck:     Vascular: No JVD.  Cardiovascular:     Rate and Rhythm: Normal rate and regular rhythm.     Heart sounds: No murmur heard.    No friction rub. No gallop.  Pulmonary:     Effort: No respiratory distress.     Breath sounds: No wheezing.  Abdominal:     General: There is no distension.     Tenderness: There is no abdominal tenderness. There is no guarding or rebound.  Musculoskeletal:        General: Normal range of motion.     Cervical back: Normal range of motion and neck supple.     Comments: Pulse motor and sensation intact bilateral lower extremities.  Reflexes are 2+ and equal.  He does have about 3 or 4 beats of clonus with the left lower extremity.  Skin:    Coloration: Skin is not pale.     Findings: No rash.  Neurological:     Mental Status: He is alert and oriented to person, place, and time.  Psychiatric:        Behavior: Behavior normal.     (all labs ordered are listed, but only abnormal results are displayed) Labs Reviewed  CBC - Abnormal; Notable for the following components:      Result Value   RBC 3.66 (*)    Hemoglobin 11.5 (*)    HCT 34.9 (*)    All other components within normal  limits  CK - Abnormal; Notable for the following components:   Total CK 35 (*)    All other components within normal limits  COMPREHENSIVE METABOLIC PANEL WITH GFR - Abnormal; Notable for the following components:   CO2 20 (*)    Glucose, Bld 116 (*)    Creatinine, Ser 1.38 (*)    Calcium  8.5 (*)  Total Protein 6.0 (*)    Albumin  3.4 (*)    GFR, Estimated 52 (*)    All other components within normal limits  URINALYSIS, ROUTINE W REFLEX MICROSCOPIC    EKG: None  Radiology: No results found.   Procedures   Medications Ordered in the ED  oxyCODONE -acetaminophen  (PERCOCET/ROXICET) 5-325 MG per tablet 1 tablet (1 tablet Oral Given 11/30/23 1826)  diazepam  (VALIUM ) injection 2.5 mg (2.5 mg Intravenous Given 11/30/23 2057)  HYDROmorphone  (DILAUDID ) injection 0.5 mg (0.5 mg Intravenous Given 11/30/23 2057)  gadobutrol  (GADAVIST ) 1 MMOL/ML injection 9 mL (9 mLs Intravenous Contrast Given 11/30/23 2210)    Clinical Course as of 11/30/23 2308  Thu Nov 30, 2023  2302 Stable Fall Back pain BL LE  Seen by Melodie S/p Fusion MRI pending read No bowel/bladder changes Some weakness  [CC]    Clinical Course User Index [CC] Jerral Meth, MD                                 Medical Decision Making Risk Prescription drug management.   78 yo M with a chief complaints of bilateral leg pain and weakness after a neurosurgical procedure done about 2 weeks ago.  On record review the patient had a posterior lumbar fusion L4-5 and L5-S1 done by Dr. [name] on July 23.  It looks like he has likely been seen in the clinic by Dr. [name] but patient states he has follow-up next week.    Friends with him pulled the nurses side and told her that they are worried about his safety at home so that he has had frequent falls.  Has been drinking quite a bit at home to try and manage his discomfort.  No leukocytosis.  No significant anemia.  Awaiting MRI.  Patient care was signed out to Dr. Jerral please  see their note for further details care in ED.  The patients results and plan were reviewed and discussed.   Any x-rays performed were independently reviewed by myself.   Differential diagnosis were considered with the presenting HPI.  Medications  oxyCODONE -acetaminophen  (PERCOCET/ROXICET) 5-325 MG per tablet 1 tablet (1 tablet Oral Given 11/30/23 1826)  diazepam  (VALIUM ) injection 2.5 mg (2.5 mg Intravenous Given 11/30/23 2057)  HYDROmorphone  (DILAUDID ) injection 0.5 mg (0.5 mg Intravenous Given 11/30/23 2057)  gadobutrol  (GADAVIST ) 1 MMOL/ML injection 9 mL (9 mLs Intravenous Contrast Given 11/30/23 2210)    Vitals:   11/30/23 1714 11/30/23 1720 11/30/23 2112  BP: (!) 148/97  (!) 150/88  Pulse: (!) 106  94  Resp: 16  18  Temp: 98.8 F (37.1 C)    TempSrc: Oral    SpO2: 94%  98%  Weight:  88.5 kg   Height:  5' 8 (1.727 m)     Final diagnoses:  Leg weakness, bilateral    Admission/ observation were discussed with the admitting physician, patient and/or family and they are comfortable with the plan.       Final diagnoses:  Leg weakness, bilateral    ED Discharge Orders     None          Emil Share, DO 11/30/23 2309

## 2023-11-30 NOTE — ED Triage Notes (Signed)
 Pt BIB [facility] d/t frequent falls leading into having back surgery 2 weeks ago & he fell again today, not on thinners. Denies LOC, does have a bruise around Rt eye from the fall. A/Ox4, rates his back pain 6/10, & his bil legs (knees down) hurt ever since his surgery as well. Pt states he is here to try & get some pain relief.

## 2023-12-01 ENCOUNTER — Encounter (HOSPITAL_COMMUNITY)
Admission: EM | Disposition: A | Discharge status: Rehabilitation Facility | Payer: Self-pay | Source: Home / Self Care | Attending: Neurosurgery

## 2023-12-01 ENCOUNTER — Encounter (HOSPITAL_COMMUNITY): Payer: Self-pay

## 2023-12-01 ENCOUNTER — Other Ambulatory Visit: Payer: Self-pay

## 2023-12-01 ENCOUNTER — Inpatient Hospital Stay (HOSPITAL_COMMUNITY): Admitting: Anesthesiology

## 2023-12-01 DIAGNOSIS — Z8616 Personal history of COVID-19: Secondary | ICD-10-CM | POA: Diagnosis not present

## 2023-12-01 DIAGNOSIS — F1010 Alcohol abuse, uncomplicated: Secondary | ICD-10-CM | POA: Diagnosis present

## 2023-12-01 DIAGNOSIS — G9589 Other specified diseases of spinal cord: Secondary | ICD-10-CM | POA: Diagnosis present

## 2023-12-01 DIAGNOSIS — M25361 Other instability, right knee: Secondary | ICD-10-CM | POA: Diagnosis present

## 2023-12-01 DIAGNOSIS — G9762 Postprocedural hematoma of a nervous system organ or structure following other procedure: Secondary | ICD-10-CM | POA: Diagnosis present

## 2023-12-01 DIAGNOSIS — I129 Hypertensive chronic kidney disease with stage 1 through stage 4 chronic kidney disease, or unspecified chronic kidney disease: Secondary | ICD-10-CM | POA: Diagnosis present

## 2023-12-01 DIAGNOSIS — N183 Chronic kidney disease, stage 3 unspecified: Secondary | ICD-10-CM | POA: Diagnosis present

## 2023-12-01 DIAGNOSIS — D649 Anemia, unspecified: Secondary | ICD-10-CM | POA: Diagnosis present

## 2023-12-01 DIAGNOSIS — J4489 Other specified chronic obstructive pulmonary disease: Secondary | ICD-10-CM | POA: Diagnosis present

## 2023-12-01 DIAGNOSIS — K592 Neurogenic bowel, not elsewhere classified: Secondary | ICD-10-CM | POA: Diagnosis present

## 2023-12-01 DIAGNOSIS — N400 Enlarged prostate without lower urinary tract symptoms: Secondary | ICD-10-CM | POA: Diagnosis present

## 2023-12-01 DIAGNOSIS — I10 Essential (primary) hypertension: Secondary | ICD-10-CM | POA: Diagnosis not present

## 2023-12-01 DIAGNOSIS — G4701 Insomnia due to medical condition: Secondary | ICD-10-CM | POA: Diagnosis present

## 2023-12-01 DIAGNOSIS — K5900 Constipation, unspecified: Secondary | ICD-10-CM | POA: Diagnosis present

## 2023-12-01 DIAGNOSIS — F419 Anxiety disorder, unspecified: Secondary | ICD-10-CM | POA: Diagnosis present

## 2023-12-01 DIAGNOSIS — I2510 Atherosclerotic heart disease of native coronary artery without angina pectoris: Secondary | ICD-10-CM

## 2023-12-01 DIAGNOSIS — E039 Hypothyroidism, unspecified: Secondary | ICD-10-CM | POA: Diagnosis present

## 2023-12-01 DIAGNOSIS — G9519 Other vascular myelopathies: Secondary | ICD-10-CM | POA: Diagnosis present

## 2023-12-01 DIAGNOSIS — L7632 Postprocedural hematoma of skin and subcutaneous tissue following other procedure: Secondary | ICD-10-CM | POA: Diagnosis present

## 2023-12-01 DIAGNOSIS — G834 Cauda equina syndrome: Secondary | ICD-10-CM | POA: Diagnosis present

## 2023-12-01 DIAGNOSIS — G9529 Other cord compression: Secondary | ICD-10-CM | POA: Diagnosis present

## 2023-12-01 DIAGNOSIS — M4807 Spinal stenosis, lumbosacral region: Secondary | ICD-10-CM | POA: Diagnosis present

## 2023-12-01 DIAGNOSIS — N1832 Chronic kidney disease, stage 3b: Secondary | ICD-10-CM | POA: Diagnosis not present

## 2023-12-01 DIAGNOSIS — W19XXXA Unspecified fall, initial encounter: Secondary | ICD-10-CM | POA: Diagnosis present

## 2023-12-01 DIAGNOSIS — R569 Unspecified convulsions: Secondary | ICD-10-CM | POA: Diagnosis not present

## 2023-12-01 DIAGNOSIS — F418 Other specified anxiety disorders: Secondary | ICD-10-CM | POA: Diagnosis not present

## 2023-12-01 DIAGNOSIS — S343XXA Injury of cauda equina, initial encounter: Secondary | ICD-10-CM | POA: Diagnosis present

## 2023-12-01 DIAGNOSIS — S343XXD Injury of cauda equina, subsequent encounter: Secondary | ICD-10-CM | POA: Diagnosis not present

## 2023-12-01 DIAGNOSIS — E669 Obesity, unspecified: Secondary | ICD-10-CM | POA: Diagnosis present

## 2023-12-01 DIAGNOSIS — Y838 Other surgical procedures as the cause of abnormal reaction of the patient, or of later complication, without mention of misadventure at the time of the procedure: Secondary | ICD-10-CM | POA: Diagnosis present

## 2023-12-01 DIAGNOSIS — M5416 Radiculopathy, lumbar region: Secondary | ICD-10-CM | POA: Diagnosis not present

## 2023-12-01 DIAGNOSIS — S300XXA Contusion of lower back and pelvis, initial encounter: Secondary | ICD-10-CM

## 2023-12-01 DIAGNOSIS — M48062 Spinal stenosis, lumbar region with neurogenic claudication: Secondary | ICD-10-CM | POA: Diagnosis present

## 2023-12-01 DIAGNOSIS — L7634 Postprocedural seroma of skin and subcutaneous tissue following other procedure: Secondary | ICD-10-CM | POA: Diagnosis present

## 2023-12-01 DIAGNOSIS — G8918 Other acute postprocedural pain: Secondary | ICD-10-CM | POA: Diagnosis not present

## 2023-12-01 DIAGNOSIS — R296 Repeated falls: Secondary | ICD-10-CM | POA: Diagnosis present

## 2023-12-01 HISTORY — PX: LUMBAR WOUND DEBRIDEMENT: SHX1988

## 2023-12-01 SURGERY — LUMBAR WOUND DEBRIDEMENT
Anesthesia: General

## 2023-12-01 MED ORDER — DEXAMETHASONE SODIUM PHOSPHATE 10 MG/ML IJ SOLN
INTRAMUSCULAR | Status: DC | PRN
  Administered 2023-12-01: 10 mg via INTRAVENOUS

## 2023-12-01 MED ORDER — LIDOCAINE 2% (20 MG/ML) 5 ML SYRINGE
INTRAMUSCULAR | Status: DC | PRN
  Administered 2023-12-01: 60 mg via INTRAVENOUS

## 2023-12-01 MED ORDER — THROMBIN 5000 UNITS EX SOLR
OROMUCOSAL | Status: DC | PRN
  Administered 2023-12-01: 5 mL via TOPICAL

## 2023-12-01 MED ORDER — ORAL CARE MOUTH RINSE: 15.0000 mL | Freq: Once | OROMUCOSAL | Status: DC

## 2023-12-01 MED ORDER — 0.9 % SODIUM CHLORIDE (POUR BTL) OPTIME
TOPICAL | Status: DC | PRN
  Administered 2023-12-01: 1000 mL

## 2023-12-01 MED ORDER — PHENYLEPHRINE 80 MCG/ML (10ML) SYRINGE FOR IV PUSH (FOR BLOOD PRESSURE SUPPORT)
PREFILLED_SYRINGE | INTRAVENOUS | Status: DC | PRN
  Administered 2023-12-01 (×3): 80 ug via INTRAVENOUS

## 2023-12-01 MED ORDER — PROPOFOL 10 MG/ML IV BOLUS
INTRAVENOUS | Status: DC | PRN
  Administered 2023-12-01: 130 mg via INTRAVENOUS

## 2023-12-01 MED ORDER — SODIUM CHLORIDE 0.9% FLUSH
3.0000 mL | INTRAVENOUS | Status: DC | PRN
Start: 2023-12-01 — End: 2023-12-05

## 2023-12-01 MED ORDER — LACTATED RINGERS IV SOLN: INTRAVENOUS | Status: AC

## 2023-12-01 MED ORDER — ACETAMINOPHEN 325 MG PO TABS
650.0000 mg | ORAL_TABLET | ORAL | Status: DC | PRN
Start: 2023-12-01 — End: 2023-12-05
  Administered 2023-12-01: 650 mg via ORAL
  Filled 2023-12-01: qty 2

## 2023-12-01 MED ORDER — SODIUM CHLORIDE 0.9 % IV SOLN: INTRAVENOUS | Status: AC

## 2023-12-01 MED ORDER — CEFAZOLIN SODIUM-DEXTROSE 2-4 GM/100ML-% IV SOLN
2.0000 g | Freq: Three times a day (TID) | INTRAVENOUS | Status: DC
  Administered 2023-12-01 – 2023-12-04 (×10): 2 g via INTRAVENOUS
  Filled 2023-12-01 (×9): qty 100

## 2023-12-01 MED ORDER — LABETALOL HCL 5 MG/ML IV SOLN
10.0000 mg | INTRAVENOUS | Status: DC | PRN
  Filled 2023-12-01: qty 4

## 2023-12-01 MED ORDER — LIDOCAINE-EPINEPHRINE 1 %-1:100000 IJ SOLN
INTRAMUSCULAR | Status: AC
  Filled 2023-12-01: qty 1

## 2023-12-01 MED ORDER — ONDANSETRON HCL 4 MG/2ML IJ SOLN
INTRAMUSCULAR | Status: DC | PRN
  Administered 2023-12-01: 4 mg via INTRAVENOUS

## 2023-12-01 MED ORDER — CHLORHEXIDINE GLUCONATE 0.12 % MT SOLN: 15.0000 mL | Freq: Once | OROMUCOSAL | Status: DC

## 2023-12-01 MED ORDER — CHLORHEXIDINE GLUCONATE 0.12 % MT SOLN
OROMUCOSAL | Status: AC
  Filled 2023-12-01: qty 15

## 2023-12-01 MED ORDER — BUPIVACAINE HCL (PF) 0.25 % IJ SOLN
INTRAMUSCULAR | Status: AC
  Filled 2023-12-01: qty 30

## 2023-12-01 MED ORDER — HYDROMORPHONE HCL 1 MG/ML IJ SOLN
0.5000 mg | INTRAMUSCULAR | Status: DC | PRN
  Administered 2023-12-01 – 2023-12-03 (×5): 0.5 mg via INTRAVENOUS
  Filled 2023-12-01 (×5): qty 0.5

## 2023-12-01 MED ORDER — ACETAMINOPHEN 650 MG RE SUPP: 650.0000 mg | RECTAL | Status: DC | PRN

## 2023-12-01 MED ORDER — MENTHOL 3 MG MT LOZG
1.0000 | LOZENGE | OROMUCOSAL | Status: DC | PRN
  Administered 2023-12-01: 3 mg via ORAL
  Filled 2023-12-01: qty 9

## 2023-12-01 MED ORDER — PROPOFOL 10 MG/ML IV BOLUS
INTRAVENOUS | Status: AC
  Filled 2023-12-01: qty 20

## 2023-12-01 MED ORDER — FENTANYL CITRATE (PF) 100 MCG/2ML IJ SOLN
25.0000 ug | INTRAMUSCULAR | Status: DC | PRN
  Administered 2023-12-01: 50 ug via INTRAVENOUS

## 2023-12-01 MED ORDER — HYDROMORPHONE HCL 1 MG/ML IJ SOLN
0.5000 mg | INTRAMUSCULAR | Status: DC | PRN
  Administered 2023-12-01: 0.5 mg via INTRAVENOUS
  Filled 2023-12-01: qty 1

## 2023-12-01 MED ORDER — LACTATED RINGERS IV SOLN
INTRAVENOUS | Status: DC | PRN
Start: 2023-12-01 — End: 2023-12-01

## 2023-12-01 MED ORDER — ROCURONIUM BROMIDE 10 MG/ML (PF) SYRINGE
PREFILLED_SYRINGE | INTRAVENOUS | Status: DC | PRN
  Administered 2023-12-01: 70 mg via INTRAVENOUS

## 2023-12-01 MED ORDER — THROMBIN (RECOMBINANT) 5000 UNITS EX SOLR
CUTANEOUS | Status: DC | PRN
  Administered 2023-12-01: 10 mL via TOPICAL

## 2023-12-01 MED ORDER — OXYCODONE HCL 5 MG PO TABS: 5.0000 mg | ORAL_TABLET | Freq: Once | ORAL | Status: DC | PRN

## 2023-12-01 MED ORDER — SODIUM CHLORIDE 0.9% FLUSH
3.0000 mL | Freq: Two times a day (BID) | INTRAVENOUS | Status: DC
  Administered 2023-12-01 – 2023-12-05 (×10): 3 mL via INTRAVENOUS

## 2023-12-01 MED ORDER — ONDANSETRON HCL 4 MG/2ML IJ SOLN
4.0000 mg | Freq: Three times a day (TID) | INTRAMUSCULAR | Status: AC | PRN
Start: 2023-12-01 — End: 2023-12-01

## 2023-12-01 MED ORDER — THROMBIN 5000 UNITS EX KIT
PACK | CUTANEOUS | Status: AC
  Filled 2023-12-01: qty 1

## 2023-12-01 MED ORDER — FENTANYL CITRATE (PF) 250 MCG/5ML IJ SOLN
INTRAMUSCULAR | Status: DC | PRN
  Administered 2023-12-01 (×3): 50 ug via INTRAVENOUS
  Administered 2023-12-01: 100 ug via INTRAVENOUS

## 2023-12-01 MED ORDER — PHENYLEPHRINE HCL-NACL 20-0.9 MG/250ML-% IV SOLN
INTRAVENOUS | Status: DC | PRN
  Administered 2023-12-01: 50 ug/min via INTRAVENOUS

## 2023-12-01 MED ORDER — FENTANYL CITRATE (PF) 250 MCG/5ML IJ SOLN
INTRAMUSCULAR | Status: AC
Start: 2023-12-01 — End: 2023-12-01
  Filled 2023-12-01: qty 5

## 2023-12-01 MED ORDER — CYCLOBENZAPRINE HCL 5 MG PO TABS
5.0000 mg | ORAL_TABLET | Freq: Three times a day (TID) | ORAL | Status: DC | PRN
  Administered 2023-12-02 – 2023-12-05 (×8): 5 mg via ORAL
  Filled 2023-12-01 (×5): qty 1

## 2023-12-01 MED ORDER — FENTANYL CITRATE (PF) 100 MCG/2ML IJ SOLN
INTRAMUSCULAR | Status: AC
Start: 2023-12-01 — End: 2023-12-01
  Filled 2023-12-01: qty 2

## 2023-12-01 MED ORDER — OXYCODONE HCL 5 MG PO TABS
5.0000 mg | ORAL_TABLET | ORAL | Status: DC | PRN
  Administered 2023-12-01 – 2023-12-05 (×21): 5 mg via ORAL
  Filled 2023-12-01 (×14): qty 1

## 2023-12-01 MED ORDER — SUGAMMADEX SODIUM 200 MG/2ML IV SOLN
INTRAVENOUS | Status: DC | PRN
  Administered 2023-12-01: 200 mg via INTRAVENOUS

## 2023-12-01 MED ORDER — ACETAMINOPHEN 10 MG/ML IV SOLN: 1000.0000 mg | Freq: Once | INTRAVENOUS | Status: DC | PRN

## 2023-12-01 MED ORDER — OXYCODONE HCL 5 MG/5ML PO SOLN: 5.0000 mg | Freq: Once | ORAL | Status: DC | PRN

## 2023-12-01 MED ORDER — SODIUM CHLORIDE 0.9 % IV SOLN: 250.0000 mL | INTRAVENOUS | Status: AC

## 2023-12-01 MED ORDER — THROMBIN 5000 UNITS EX KIT
PACK | CUTANEOUS | Status: AC
Start: 2023-12-01 — End: 2023-12-01
  Filled 2023-12-01: qty 2

## 2023-12-01 MED ORDER — PHENOL 1.4 % MT LIQD: 1.0000 | OROMUCOSAL | Status: DC | PRN

## 2023-12-01 SURGICAL SUPPLY — 44 items
BAG COUNTER SPONGE SURGICOUNT (BAG) ×1 IMPLANT
BENZOIN TINCTURE PRP APPL 2/3 (GAUZE/BANDAGES/DRESSINGS) ×1 IMPLANT
BLADE CLIPPER SURG (BLADE) IMPLANT
CANISTER SUCTION 3000ML PPV (SUCTIONS) ×1 IMPLANT
DERMABOND ADVANCED .7 DNX12 (GAUZE/BANDAGES/DRESSINGS) ×1 IMPLANT
DRAPE LAPAROTOMY 100X72X124 (DRAPES) ×1 IMPLANT
DRAPE SURG 17X23 STRL (DRAPES) IMPLANT
DRSG OPSITE 4X5.5 SM (GAUZE/BANDAGES/DRESSINGS) IMPLANT
DRSG OPSITE POSTOP 4X6 (GAUZE/BANDAGES/DRESSINGS) IMPLANT
DRSG OPSITE POSTOP 4X8 (GAUZE/BANDAGES/DRESSINGS) IMPLANT
ELECTRODE REM PT RTRN 9FT ADLT (ELECTROSURGICAL) ×1 IMPLANT
EVACUATOR 1/8 PVC DRAIN (DRAIN) IMPLANT
GAUZE 4X4 16PLY ~~LOC~~+RFID DBL (SPONGE) IMPLANT
GAUZE SPONGE 4X4 12PLY STRL (GAUZE/BANDAGES/DRESSINGS) ×1 IMPLANT
GLOVE BIO SURGEON STRL SZ7 (GLOVE) IMPLANT
GLOVE BIO SURGEON STRL SZ8 (GLOVE) ×1 IMPLANT
GLOVE BIOGEL PI IND STRL 7.0 (GLOVE) IMPLANT
GLOVE BIOGEL PI IND STRL 7.5 (GLOVE) IMPLANT
GLOVE BIOGEL PI IND STRL 8 (GLOVE) IMPLANT
GLOVE ECLIPSE 8.0 STRL XLNG CF (GLOVE) IMPLANT
GLOVE INDICATOR 8.5 STRL (GLOVE) ×2 IMPLANT
GOWN STRL REUS W/ TWL LRG LVL3 (GOWN DISPOSABLE) ×1 IMPLANT
GOWN STRL REUS W/ TWL XL LVL3 (GOWN DISPOSABLE) ×1 IMPLANT
GOWN STRL REUS W/TWL 2XL LVL3 (GOWN DISPOSABLE) IMPLANT
HEMOSTAT POWDER KIT SURGIFOAM (HEMOSTASIS) IMPLANT
KIT BASIN OR (CUSTOM PROCEDURE TRAY) ×1 IMPLANT
KIT TURNOVER KIT B (KITS) ×1 IMPLANT
NDL HYPO 25X1 1.5 SAFETY (NEEDLE) IMPLANT
NEEDLE HYPO 25X1 1.5 SAFETY (NEEDLE) IMPLANT
NS IRRIG 1000ML POUR BTL (IV SOLUTION) ×1 IMPLANT
PACK LAMINECTOMY NEURO (CUSTOM PROCEDURE TRAY) ×1 IMPLANT
PATTIES SURGICAL .75X.75 (GAUZE/BANDAGES/DRESSINGS) IMPLANT
SPIKE FLUID TRANSFER (MISCELLANEOUS) ×1 IMPLANT
SPONGE SURGIFOAM ABS GEL SZ50 (HEMOSTASIS) ×1 IMPLANT
STRIP CLOSURE SKIN 1/2X4 (GAUZE/BANDAGES/DRESSINGS) ×1 IMPLANT
SUT VIC AB 0 CT1 18XCR BRD8 (SUTURE) ×1 IMPLANT
SUT VIC AB 2-0 CT1 18 (SUTURE) ×1 IMPLANT
SUT VIC AB 4-0 PS2 27 (SUTURE) ×1 IMPLANT
SWAB COLLECTION DEVICE MRSA (MISCELLANEOUS) ×1 IMPLANT
SWAB CULTURE ESWAB REG 1ML (MISCELLANEOUS) ×1 IMPLANT
SYR CONTROL 10ML LL (SYRINGE) IMPLANT
TOWEL GREEN STERILE (TOWEL DISPOSABLE) ×1 IMPLANT
TOWEL GREEN STERILE FF (TOWEL DISPOSABLE) ×1 IMPLANT
WATER STERILE IRR 1000ML POUR (IV SOLUTION) ×1 IMPLANT

## 2023-12-01 NOTE — ED Notes (Signed)
 Darryle, RN approved for pt transport to [facility] inpatient.

## 2023-12-01 NOTE — ED Notes (Signed)
 Pt transported to [facility]28, pt ambulated to inpatient bed from stretcher with two-person assistance.

## 2023-12-01 NOTE — ED Notes (Signed)
 Pt and caregiver requesting update.  Caregiver informed we are waiting on MRI results.

## 2023-12-01 NOTE — ED Notes (Signed)
 Provider bedside.

## 2023-12-01 NOTE — Anesthesia Preprocedure Evaluation (Signed)
 Anesthesia Evaluation  Patient identified by MRN, date of birth, ID band Patient awake    Reviewed: Allergy & Precautions, NPO status , Patient's Chart, lab work & pertinent test results  History of Anesthesia Complications (+) PONV and history of anesthetic complications  Airway Mallampati: III  TM Distance: >3 FB Neck ROM: Full    Dental  (+) Teeth Intact, Dental Advisory Given, Caps   Pulmonary asthma , COPD   breath sounds clear to auscultation       Cardiovascular hypertension, Pt. on medications + CAD   Rhythm:Regular Rate:Normal     Neuro/Psych  Headaches PSYCHIATRIC DISORDERS Anxiety  Bipolar Disorder    Neuromuscular disease    GI/Hepatic ,GERD  Medicated,,(+)     substance abuse  alcohol  use  Endo/Other  Hypothyroidism  Obesity   Renal/GU Renal InsufficiencyRenal disease     Musculoskeletal  (+) Arthritis ,    Abdominal   Peds  Hematology negative hematology ROS (+)   Anesthesia Other Findings   Reproductive/Obstetrics                              Anesthesia Physical Anesthesia Plan  ASA: 3  Anesthesia Plan: General   Post-op Pain Management: Tylenol  PO (pre-op)*   Induction: Intravenous  PONV Risk Score and Plan: 3 and Dexamethasone , Ondansetron  and Propofol  infusion  Airway Management Planned: Oral ETT  Additional Equipment: ClearSight  Intra-op Plan:   Post-operative Plan: Extubation in OR  Informed Consent: I have reviewed the patients History and Physical, chart, labs and discussed the procedure including the risks, benefits and alternatives for the proposed anesthesia with the patient or authorized representative who has indicated his/her understanding and acceptance.     Dental advisory given  Plan Discussed with: CRNA  Anesthesia Plan Comments: (PAT note written 11/03/2023 by Isaiah Ruder, PA-C.  2nd PIV after induction )          Anesthesia Quick Evaluation

## 2023-12-01 NOTE — ED Provider Notes (Signed)
 Care of patient received from prior provider at 2:23 AM, please see their note for complete H/P and care plan.  Received handoff per ED course.  Clinical Course as of 12/01/23 0223  Thu Nov 30, 2023  2302 Stable Fall Back pain BL LE  Seen by Melodie S/p Fusion MRI pending read No bowel/bladder changes Some weakness  [CC]    Clinical Course User Index [CC] Jerral Meth, MD   CRITICAL CARE Performed by: Meth Jerral   Total critical care time: 30 minutes  Critical care time was exclusive of separately billable procedures and treating other patients.  Critical care was necessary to treat or prevent imminent or life-threatening deterioration.  Critical care was time spent personally by me on the following activities: development of treatment plan with patient and/or surrogate as well as nursing, discussions with consultants, evaluation of patient's response to treatment, examination of patient, obtaining history from patient or surrogate, ordering and performing treatments and interventions, ordering and review of laboratory studies, ordering and review of radiographic studies, pulse oximetry and re-evaluation of patient's condition.  Reassessment: MRI shows possible developing cauda equina.  Discussed with neurosurgery recommend admission to their service with plan for a.m. operative intervention.      Jerral Meth, MD 12/01/23 [phone]

## 2023-12-01 NOTE — Plan of Care (Signed)
  Problem: Activity: Goal: Risk for activity intolerance will decrease Outcome: Progressing   Problem: Coping: Goal: Level of anxiety will decrease Outcome: Progressing   Problem: Elimination: Goal: Will not experience complications related to bowel motility Outcome: Progressing Goal: Will not experience complications related to urinary retention Outcome: Progressing   Problem: Pain Managment: Goal: General experience of comfort will improve and/or be controlled Outcome: Progressing   Problem: Safety: Goal: Ability to remain free from injury will improve Outcome: Progressing

## 2023-12-01 NOTE — ED Notes (Signed)
 Visitor stated we have been here for hours, he needs pain medications.  Provider made aware.

## 2023-12-01 NOTE — Transfer of Care (Signed)
 Immediate Anesthesia Transfer of Care Note  Patient: Miguel Williams  Procedure(s) Performed: LUMBAR WOUND DEBRIDEMENT  Patient Location: PACU  Anesthesia Type:General  Level of Consciousness: awake, alert , oriented, and patient cooperative  Airway & Oxygen Therapy: Patient Spontanous Breathing and Patient connected to face mask oxygen  Post-op Assessment: Report given to RN, Post -op Vital signs reviewed and stable, Patient moving all extremities, and Patient moving all extremities X 4  Post vital signs: Reviewed and stable  Last Vitals:  Vitals Value Taken Time  BP 162/95 12/01/23 11:45  Temp 36.1 C 12/01/23 11:42  Pulse 95 12/01/23 11:51  Resp 16 12/01/23 11:51  SpO2 97 % 12/01/23 11:51  Vitals shown include unfiled device data.  Last Pain:  Vitals:   12/01/23 1142  TempSrc:   PainSc: 0-No pain         Complications: No notable events documented.

## 2023-12-01 NOTE — Anesthesia Procedure Notes (Signed)
 Procedure Name: Intubation Date/Time: 12/01/2023 10:40 AM  Performed by: Arvell Edsel HERO, CRNAPre-anesthesia Checklist: Patient identified, Emergency Drugs available, Suction available, Patient being monitored and Timeout performed Patient Re-evaluated:Patient Re-evaluated prior to induction Oxygen Delivery Method: Circle system utilized Preoxygenation: Pre-oxygenation with 100% oxygen Induction Type: IV induction Ventilation: Mask ventilation without difficulty Laryngoscope Size: Mac and 3 Grade View: Grade II Tube size: 7.0 mm Number of attempts: 1 Airway Equipment and Method: Patient positioned with wedge pillow and Stylet Placement Confirmation: ETT inserted through vocal cords under direct vision, CO2 detector, breath sounds checked- equal and bilateral and positive ETCO2 Secured at: 23 cm Tube secured with: Tape

## 2023-12-01 NOTE — ED Notes (Signed)
 Pts  caregiver updated on pts status. Provider made aware of pts pain level.

## 2023-12-01 NOTE — Progress Notes (Signed)
 Subjective: Patient reports increased back and leg pain over the last few days and progressive since surgery.  Objective: Vital signs in last 24 hours: Temp:  [98.3 F (36.8 C)-98.8 F (37.1 C)] 98.5 F (36.9 C) (08/08 0311) Pulse Rate:  [94-106] 98 (08/08 0311) Resp:  [16-20] 20 (08/08 0311) BP: (148-188)/(88-99) 188/98 (08/08 0311) SpO2:  [94 %-99 %] 99 % (08/08 0311) Weight:  [88.5 kg-91.7 kg] 91.7 kg (08/08 0311)  Intake/Output from previous day: No intake/output data recorded. Intake/Output this shift: Total I/O In: -  Out: 50 [Urine:50]  Strength 5 out of 5 maybe some slight dorsiflexion weakness on the right at 4+ out of 5  Lab Results: Recent Labs    11/30/23 1923  WBC 8.7  HGB 11.5*  HCT 34.9*  PLT 278   BMET Recent Labs    11/30/23 1932  NA 135  K 4.5  CL 109  CO2 20*  GLUCOSE 116*  BUN 22  CREATININE 1.38*  CALCIUM  8.5*    Studies/Results: MR Lumbar Spine W Wo Contrast Result Date: 12/01/2023 CLINICAL DATA:  Lumbar radiculopathy, prior surgery, new symptoms EXAM: MRI LUMBAR SPINE WITHOUT AND WITH CONTRAST TECHNIQUE: Multiplanar and multiecho pulse sequences of the lumbar spine were obtained without and with intravenous contrast. CONTRAST:  9mL GADAVIST  GADOBUTROL  1 MMOL/ML IV SOLN COMPARISON:  MRI lumbar spine May 22, 25. FINDINGS: Segmentation:  Standard. Alignment: Similar trace retrolisthesis of L1 on L2, L2 on L3 and L4 on L5. Vertebrae: Postoperative changes of L4-S1 PLIF, new since the prior. No specific evidence of acute fracture or discitis/osteomyelitis. Artifact from the fusion hardware does limit assessment. Conus medullaris and cauda equina: Conus extends to the L2 level. Conus appears normal. Paraspinal and other soft tissues: Postoperative changes in the lower lumbar spine. This includes a large (6.8 x 6.0 x 4.0 cm) posterior paraspinal fluid collection which spans from L4-S1 and extends anteriorly into the dorsal canal resulting in severe  canal stenosis is greatest at L4-L5. Disc levels: T12-L1: Mild disc bulging.  No significant stenosis. L1-L2: Disc bulging without significant stenosis. L2-L3: Disc bulging and moderate facet and ligamentum flavum hypertrophy with mild canal stenosis. No significant foraminal stenosis. L3-L4: Disc bulging and moderate to severe facet and ligamentum flavum hypertrophy without significant stenosis. L4-L5: Interval posterior decompression and PLIF. The large fluid collection described above extends into the canal with anterior displacement of the cauda equina nerve roots and severe canal stenosis. There is limited assessment of the foramina but the foramina appear mildly narrowed. L5-S1: Posterior disc bulging and endplate spurring. Interval posterior decompression and posterior fusion. The posterior fluid collection scribed above extends into the canal with mild to moderate canal stenosis. Moderate bilateral foraminal stenosis. IMPRESSION: 1. Large 6.8 cm posterior paraspinal fluid collection, likely postoperative seroma given recent L4-S1 PLIF but sterility indeterminate by MRI. 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 2. At L5-S1, moderate bilateral foraminal stenosis. Electronically Signed   By: Gilmore GORMAN Molt M.D.   On: 12/01/2023 00:13    Assessment/Plan: 78 year old gentleman status post two-level lumbar fusion presented the emergency room last night with increased back and leg pain worse in his legs.  Workup has revealed large what looks like epidural hematoma and wound hematoma.  So due to the patient's progression of clinical syndrome imaging findings have recommended I&D and washout of lumbar wound hematoma.  I have extensively gone over the  risks and benefits of the operation with the patient as well as perioperative course expectations of outcome and alternatives to surgery  and he understands and agrees to proceed forward.  LOS: 0 days     Miguel Williams 12/01/2023, 7:42 AM

## 2023-12-01 NOTE — Op Note (Signed)
 Preoperative diagnosis: Lumbar wound epidural hematoma.  Postoperative diagnosis: Same.  Procedure: Reexploration of lumbar wound for evacuation of hematoma.  Surgeon: Arley Haset [place]  Assistant: None  Anesthesia: General  EBL: Minimal.  HPI: 78 year old gentleman previously undergone two-level lumbar fusion presented with increased back and leg pain workup revealed large wound hematoma with severe compression of thecal sac.  Patient was recommended reexploration of lumbar wound for evacuation of hematoma.  I extensively reviewed the risks and benefits of the operation with him as well as perioperative course expectations of outcome and alternatives to surgery and he understood and agreed to proceed forward.  Operative procedure: Patient was brought into the OR was induced under general anesthesia positioned prone the Wilson frame his back was prepped and draped in routine sterile fashion his old incision was opened up immediately and a lot of fluid under pressure came out it was clear old blood seromatous material we did send for cultures.  I then cut the sutures divide the fascia back down to the epidural space and again a large amount of seromatous clear fluid noninfectious appearing was evacuated I then inspected the foramina of L4 and L5-S1 to confirm no residual compression copiously irrigated the wound meticulous hemostasis was maintained Gelfoam was ON top of the dura a medium VAC drain was placed and the wound was closed in layers with interrupted Vicryl.  The construct did appear to be solid as well.  And the skin was closed running 4 subcuticular Dermabond benzoin Steri-Strips and sterile dressing was applied patient to cover him in stable addition.  At the end the case on needle count sponge counts were correct.

## 2023-12-01 NOTE — ED Notes (Signed)
 Pt looked cold, RN asked if he needed a blanket he stated I'm fine.  Visitor stated He's cold.  Pt stated I'll take a blanket.  RN provided two blankets, one for each person.  Pt stated I'm good, thank you.

## 2023-12-02 MED ORDER — DULOXETINE HCL 60 MG PO CPEP
60.0000 mg | ORAL_CAPSULE | Freq: Every morning | ORAL | Status: DC
  Administered 2023-12-03 – 2023-12-05 (×5): 60 mg via ORAL
  Filled 2023-12-02 (×3): qty 1

## 2023-12-02 MED ORDER — TAMSULOSIN HCL 0.4 MG PO CAPS
0.4000 mg | ORAL_CAPSULE | Freq: Every day | ORAL | Status: DC
  Administered 2023-12-02 – 2023-12-05 (×6): 0.4 mg via ORAL
  Filled 2023-12-02 (×4): qty 1

## 2023-12-02 MED ORDER — COLCHICINE 0.6 MG PO TABS
0.6000 mg | ORAL_TABLET | Freq: Every morning | ORAL | Status: DC
  Administered 2023-12-03 – 2023-12-05 (×5): 0.6 mg via ORAL
  Filled 2023-12-02 (×4): qty 1

## 2023-12-02 MED ORDER — LEVOTHYROXINE SODIUM 100 MCG PO TABS
100.0000 ug | ORAL_TABLET | ORAL | Status: DC
  Administered 2023-12-03: 100 ug via ORAL
  Filled 2023-12-02: qty 1

## 2023-12-02 MED ORDER — DUTASTERIDE 0.5 MG PO CAPS
0.5000 mg | ORAL_CAPSULE | Freq: Every evening | ORAL | Status: DC
  Administered 2023-12-02 – 2023-12-04 (×4): 0.5 mg via ORAL
  Filled 2023-12-02 (×4): qty 1

## 2023-12-02 MED ORDER — LOPERAMIDE HCL 2 MG PO CAPS: 2.0000 mg | ORAL_CAPSULE | Freq: Four times a day (QID) | ORAL | Status: DC | PRN

## 2023-12-02 MED ORDER — LEVOTHYROXINE SODIUM 50 MCG PO TABS
50.0000 ug | ORAL_TABLET | ORAL | Status: DC
  Administered 2023-12-04 – 2023-12-05 (×4): 50 ug via ORAL
  Filled 2023-12-02 (×2): qty 1

## 2023-12-02 MED ORDER — THIAMINE MONONITRATE 100 MG PO TABS
100.0000 mg | ORAL_TABLET | Freq: Every day | ORAL | Status: DC
  Administered 2023-12-02 – 2023-12-05 (×6): 100 mg via ORAL
  Filled 2023-12-02 (×4): qty 1

## 2023-12-02 MED ORDER — CLONAZEPAM 0.5 MG PO TABS
0.5000 mg | ORAL_TABLET | Freq: Every evening | ORAL | Status: DC | PRN
  Administered 2023-12-04 (×2): 0.5 mg via ORAL
  Filled 2023-12-02: qty 1

## 2023-12-02 MED ORDER — VITAMIN B-12 1000 MCG PO TABS
500.0000 ug | ORAL_TABLET | Freq: Every day | ORAL | Status: DC
  Administered 2023-12-02 – 2023-12-05 (×6): 500 ug via ORAL
  Filled 2023-12-02 (×4): qty 1

## 2023-12-02 MED ORDER — PANTOPRAZOLE SODIUM 20 MG PO TBEC
20.0000 mg | DELAYED_RELEASE_TABLET | Freq: Every day | ORAL | Status: DC
  Administered 2023-12-02 – 2023-12-05 (×6): 20 mg via ORAL
  Filled 2023-12-02 (×4): qty 1

## 2023-12-02 MED ORDER — EZETIMIBE 10 MG PO TABS
10.0000 mg | ORAL_TABLET | Freq: Every morning | ORAL | Status: DC
  Administered 2023-12-03 – 2023-12-05 (×5): 10 mg via ORAL
  Filled 2023-12-02 (×3): qty 1

## 2023-12-02 MED ORDER — PROPYLENE GLYCOL (PF) 0.6 % OP SOLN: 1.0000 [drp] | Freq: Four times a day (QID) | OPHTHALMIC | Status: DC | PRN

## 2023-12-02 MED ORDER — POLYVINYL ALCOHOL 1.4 % OP SOLN: 1.0000 [drp] | Freq: Four times a day (QID) | OPHTHALMIC | Status: DC | PRN

## 2023-12-02 MED ORDER — FERROUS SULFATE 325 (65 FE) MG PO TABS
325.0000 mg | ORAL_TABLET | Freq: Three times a day (TID) | ORAL | Status: DC
  Administered 2023-12-02 – 2023-12-05 (×14): 325 mg via ORAL
  Filled 2023-12-02 (×10): qty 1

## 2023-12-02 MED ORDER — ATORVASTATIN CALCIUM 40 MG PO TABS
40.0000 mg | ORAL_TABLET | Freq: Every morning | ORAL | Status: DC
  Administered 2023-12-03 – 2023-12-05 (×5): 40 mg via ORAL
  Filled 2023-12-02 (×3): qty 1

## 2023-12-02 MED ORDER — IRBESARTAN 150 MG PO TABS
75.0000 mg | ORAL_TABLET | Freq: Every day | ORAL | Status: DC
  Administered 2023-12-02 – 2023-12-05 (×6): 75 mg via ORAL
  Filled 2023-12-02 (×4): qty 1

## 2023-12-02 MED ORDER — INULIN 1.5 G PO CHEW: CHEWABLE_TABLET | Freq: Every day | ORAL | Status: DC

## 2023-12-02 NOTE — Progress Notes (Signed)
 Patient's care giver said family is requesting for patient to go to rehab. We continue to monitor.

## 2023-12-02 NOTE — Progress Notes (Signed)
 Patient disconnected his Hemovac, on-call notified, ordered to d/c Hemovac. Hemovac removed per order, patient tolerated procedure well. We will continue to monitor.

## 2023-12-02 NOTE — Plan of Care (Signed)
   Problem: Education: Goal: Knowledge of General Education information will improve Description: Including pain rating scale, medication(s)/side effects and non-pharmacologic comfort measures Outcome: Progressing   Problem: Nutrition: Goal: Adequate nutrition will be maintained Outcome: Progressing   Problem: Coping: Goal: Level of anxiety will decrease Outcome: Progressing

## 2023-12-02 NOTE — Progress Notes (Signed)
    Providing Compassionate, Quality Care - Together   NEUROSURGERY PROGRESS NOTE     S: No issues overnight.    O: EXAM:  BP 136/80 (BP Location: Right Arm)   Pulse 92   Temp 98.3 F (36.8 C)   Resp 18   Ht 5' 8 (1.727 m)   Wt 88.5 kg   SpO2 96%   BMI 29.65 kg/m     Awake, alert, oriented  Speech fluent, appropriate  BUE/BLE 5/5 SILTx4 JP in place, 65cc o/n.    ASSESSMENT:  78 y.o. with h/o lumbar fusion s/p I&D for evacuation of postop fluid collection, findings intra-op c/w postop seroma/hematoma    PLAN: -Continue JP, likely dc tmrw AM and dc to home.  -PT today -Call w/ questions/concerns.   Camie Pickle, [facility]

## 2023-12-03 NOTE — Plan of Care (Signed)
   Problem: Education: Goal: Knowledge of General Education information will improve Description: Including pain rating scale, medication(s)/side effects and non-pharmacologic comfort measures Outcome: Progressing   Problem: Pain Managment: Goal: General experience of comfort will improve and/or be controlled Outcome: Progressing   Problem: Safety: Goal: Ability to remain free from injury will improve Outcome: Progressing

## 2023-12-03 NOTE — Progress Notes (Signed)
 Inpatient Rehab Admissions Coordinator Note:   Per PT patient was screened for CIR candidacy by Petrea Fredenburg SHAUNNA Yvone Cohens, CCC-SLP. At this time, pt appears to be a potential candidate for CIR. I will place an order for rehab consult for full assessment, per our protocol.  Please contact me any with questions.SABRA Tinnie Yvone Cohens, MS, CCC-SLP Admissions Coordinator [phone] 12/03/23 11:51 AM

## 2023-12-03 NOTE — Evaluation (Signed)
 Physical Therapy Evaluation Patient Details Name: Miguel Williams MRN: [id number] DOB: 06-28-1945 Today's Date: 12/03/2023  History of Present Illness  Pt is a 78 yo man who presented 11/30/23 with low back pain and weakness along with several falls since his elective PLIF L4-S1 with decompressive laminectomies on 11/15/23. Workup has revealed large what looks like epidural hematoma and wound hematoma. S/p reexploration of lumbar wound for evacuation of hematoma 8/8. PMH of anxiety, cancer, CKD III, HLD, HTN, hypothyroidism   Clinical Impression  Pt presents with condition above and deficits mentioned below, see PT Problem List. PTA, he was living in a 2-level house with 2-3 STE. He does not have to go upstairs. His HH RN lives with him to provide 24/7 care. Since his recent back surgery, the pt has been needing a little help and has been using his RW more than cane. Prior to back surgery, pt was mod I using his cane primarily. Pt endorses several recent falls. He is currently limited by back pain and displaying deficits in bil leg strength (R weaker than L), balance, and activity tolerance. He is currently at high risk for subsequent falls, displaying a tendency to ambulate with his knees flexed in stance phase and appearing close to buckling often. He ambulates unsteadily, only tolerating ambulating up to ~6-8 ft bouts at a time currently. He is currently requiring minA for transfers and gait. Due to his high risk for falls, need for frequent assistance, and limited gait distance capabilities, he may benefit from intensive inpatient rehab, > 3 hours/day, to maximize his independence and safety with all functional mobility prior to return home. If he can progress quickly to tolerate ambulating longer distances with improved stability he may be able to d/c home with his [facility] RN assisting him. Will continue to follow acutely.      If plan is discharge home, recommend the following: A little help with  bathing/dressing/bathroom;Assistance with cooking/housework;Assist for transportation;Help with stairs or ramp for entrance;A little help with walking and/or transfers;Direct supervision/assist for medications management;Direct supervision/assist for financial management;Supervision due to cognitive status   Can travel by private vehicle        Equipment Recommendations BSC/3in1;Wheelchair (measurements PT);Wheelchair cushion (measurements PT) (pending progress)  Recommendations for Other Services  Rehab consult;OT consult    Functional Status Assessment Patient has had a recent decline in their functional status and demonstrates the ability to make significant improvements in function in a reasonable and predictable amount of time.     Precautions / Restrictions Precautions Precautions: Back;Fall Precaution Booklet Issued: Yes (comment) (per chart, received it last admission) Recall of Precautions/Restrictions: Impaired Precaution/Restrictions Comments: only recalls 1/3 back precautions, needs cues for compliance; multiple recent falls Required Braces or Orthoses: Spinal Brace Spinal Brace: Lumbar corset;Applied in sitting position Restrictions Weight Bearing Restrictions Per Provider Order: No      Mobility  Bed Mobility Overal bed mobility: Needs Assistance Bed Mobility: Rolling, Sidelying to Sit, Sit to Sidelying Rolling: Supervision Sidelying to sit: Supervision, HOB elevated     Sit to sidelying: Supervision, HOB elevated General bed mobility comments: Pt prompted by PT for complying to spinal precautions with bed mobility. Pt able to perform all bed mobility aspects with supervision for safety    Transfers Overall transfer level: Needs assistance Equipment used: Rolling walker (2 wheels) Transfers: Sit to/from Stand Sit to Stand: Min assist, Contact guard assist           General transfer comment: The first rep to stand from EOB,  pt was leaning posteriorly and  needed minA for balance. Upon the second rep from EOB, pt improved his anterior weight shift and progressed to CGA for safety. Pt performed x2 more reps from a chair with CGA-minA for balance.    Ambulation/Gait Ambulation/Gait assistance: Min assist Gait Distance (Feet): 8 Feet (x3 bouts of ~6-8 ft per bout) Assistive device: Rolling walker (2 wheels) Gait Pattern/deviations: Step-through pattern, Decreased step length - right, Decreased step length - left, Decreased stride length, Knee flexed in stance - right, Knee flexed in stance - left, Trunk flexed Gait velocity: reduced Gait velocity interpretation: <1.31 ft/sec, indicative of household ambulator   General Gait Details: Pt takes slow, small, unsteady steps. He tends to flex his knees during stance phase and seems unsteady like his knees are close to buckling. Repeated multi-modal cues provided to extend his knees during stance phase, but poor carryover noted. MinA provided continuously for balance and safety with close chair follow.  Stairs            Wheelchair Mobility     Tilt Bed    Modified Rankin (Stroke Patients Only)       Balance Overall balance assessment: Needs assistance, History of Falls Sitting-balance support: No upper extremity supported, Feet supported Sitting balance-Leahy Scale: Fair Sitting balance - Comments: sitting EOB   Standing balance support: Bilateral upper extremity supported, During functional activity, Reliant on assistive device for balance Standing balance-Leahy Scale: Poor Standing balance comment: reliant on RW and minA                             Pertinent Vitals/Pain Pain Assessment Pain Assessment: Faces Faces Pain Scale: Hurts little more Pain Location: back Pain Descriptors / Indicators: Discomfort, Grimacing, Guarding, Operative site guarding Pain Intervention(s): Limited activity within patient's tolerance, Monitored during session, Repositioned, Ice applied     Home Living Family/patient expects to be discharged to:: Private residence Living Arrangements: Non-relatives/Friends [name], Home care nurse) Available Help at Discharge: Home health;Available 24 hours/day [facility] RN) Type of Home: House Home Access: Stairs to enter Entrance Stairs-Rails: Left;Right;Can reach both Entrance Stairs-Number of Steps: 2-3   Home Layout: Able to live on main level with bedroom/bathroom;Two level Home Equipment: Pharmacist, hospital (2 wheels);Cane - single point      Prior Function Prior Level of Function : Independent/Modified Independent;History of Falls (last six months)             Mobility Comments: Has been needing a little help and has been using RW more than cane after recent back surgery. Prior to back surgery, pt was mod I using cane mostly but has RW available when needed. Pt reports ~x6 falls since January ADLs Comments: RN manages meds and does driving and also does cooking and cleaning; pt dresses and bathes himself     Extremity/Trunk Assessment   Upper Extremity Assessment Upper Extremity Assessment: Defer to OT evaluation    Lower Extremity Assessment Lower Extremity Assessment: RLE deficits/detail;LLE deficits/detail RLE Deficits / Details: MMT scores of 4- hip flexion, 4- knee extension, 3+ ankle dorsiflexion; denied numbness/tingling; reports L leg was more painful and an issue prior to back surgery but now his R leg is more painful and weak LLE Deficits / Details: MMT scores of 4- hip flexion, 4+ knee extension, 4- ankle dorsiflexion; denied numbness/tingling; reports L leg was more painful and an issue prior to back surgery but now his R leg is more painful  and weak    Cervical / Trunk Assessment Cervical / Trunk Assessment: Back Surgery  Communication   Communication Communication: No apparent difficulties    Cognition Arousal: Alert Behavior During Therapy: WFL for tasks assessed/performed   PT - Cognitive  impairments: Memory, Awareness, No family/caregiver present to determine baseline, Attention, Safety/Judgement, Problem solving                       PT - Cognition Comments: Only recalls 1/3 precautions and needs repeated reminders for compliance. Pt needs redirecting at times. Poor awareness that his knee flexion when standing places him at risk for falls, stating I just don't want to fall when trying to educate him and cue him to extend his knees instead. Following commands: Impaired Following commands impaired: Follows one step commands with increased time, Follows multi-step commands with increased time, Follows multi-step commands inconsistently     Cueing Cueing Techniques: Verbal cues, Tactile cues, Visual cues     General Comments General comments (skin integrity, edema, etc.): encouraged OOB with nursing staff as able while here    Exercises     Assessment/Plan    PT Assessment Patient needs continued PT services  PT Problem List Decreased strength;Decreased activity tolerance;Decreased balance;Decreased mobility;Decreased cognition;Decreased knowledge of precautions;Pain       PT Treatment Interventions DME instruction;Gait training;Stair training;Functional mobility training;Therapeutic activities;Therapeutic exercise;Neuromuscular re-education;Balance training;Patient/family education;Cognitive remediation    PT Goals (Current goals can be found in the Care Plan section)  Acute Rehab PT Goals Patient Stated Goal: to improve PT Goal Formulation: With patient Time For Goal Achievement: 12/17/23 Potential to Achieve Goals: Good    Frequency Min 3X/week     Co-evaluation               AM-PAC PT 6 Clicks Mobility  Outcome Measure Help needed turning from your back to your side while in a flat bed without using bedrails?: A Little Help needed moving from lying on your back to sitting on the side of a flat bed without using bedrails?: A Little Help  needed moving to and from a bed to a chair (including a wheelchair)?: A Little Help needed standing up from a chair using your arms (e.g., wheelchair or bedside chair)?: A Little Help needed to walk in hospital room?: Total (<20 ft) Help needed climbing 3-5 steps with a railing? : Total 6 Click Score: 14    End of Session Equipment Utilized During Treatment: Gait belt;Back brace Activity Tolerance: Patient tolerated treatment well Patient left: in bed;with call bell/phone within reach;with bed alarm set Nurse Communication: Mobility status PT Visit Diagnosis: Unsteadiness on feet (R26.81);Pain;Other abnormalities of gait and mobility (R26.89);Muscle weakness (generalized) (M62.81);History of falling (Z91.81);Repeated falls (R29.6);Difficulty in walking, not elsewhere classified (R26.2) Pain - Right/Left:  (back) Pain - part of body:  (back)    Time: 9065-8997 PT Time Calculation (min) (ACUTE ONLY): 28 min   Charges:   PT Evaluation $PT Eval Moderate Complexity: 1 Mod PT Treatments $Therapeutic Activity: 8-22 mins PT General Charges $$ ACUTE PT VISIT: 1 Visit         Theo Ferretti, PT, DPT Acute Rehabilitation Services  Office: [phone]   Theo CHRISTELLA Ferretti 12/03/2023, 11:04 AM

## 2023-12-03 NOTE — Progress Notes (Signed)
    Providing Compassionate, Quality Care - Together   NEUROSURGERY PROGRESS NOTE     S: No issues overnight. Therapies with difficulty today.    O: EXAM:  BP (!) 157/78 (BP Location: Right Arm)   Pulse 91   Temp 97.7 F (36.5 C) (Oral)   Resp 17   Ht 5' 8 (1.727 m)   Wt 88.5 kg   SpO2 97%   BMI 29.65 kg/m     Awake, alert, oriented  Speech fluent, appropriate   Strength/sensation grossly intact BUE/BLE Dressing c/d/i   ASSESSMENT:  78 y.o. with h/o lumbar fusion s/p I&D for evacuation of postop fluid collection   PLAN: -Continue therapies as tolerated -Continue supportive care -Call w/ questions/concerns.   Camie Pickle, [facility]

## 2023-12-03 NOTE — Progress Notes (Signed)
 Inpatient Rehab Admissions:  Inpatient Rehab Consult received.  I met with patient at the bedside for rehabilitation assessment and to discuss goals and expectations of an inpatient rehab admission.  Discussed average length of stay, insurance authorization requirement and discharge home after completion of CIR. Pt acknowledged understanding. Pt interested in pursuing CIR. Pt gave permission to contact [name]. Spoke with [name] on the telephone. She also acknowledged CIR goals and expectations. [name] is supportive of pt pursuing CIR. She confirmed that she will be able to provide support for pt after discharge. Will continue to follow.  Signed: Tinnie Yvone Cohens, MS, CCC-SLP Admissions Coordinator [phone]

## 2023-12-03 NOTE — PMR Pre-admission (Signed)
 PMR Admission Coordinator Pre-Admission Assessment  Patient: Miguel Williams is an 78 y.o., male MRN: [id number] DOB: 03/03/1946 Height: 5' 8 (172.7 cm) Weight: 88.5 kg              Insurance Information HMO:     PPO: yes     PCP:      IPA:      80/20:      OTHER:  PRIMARY: UHC Medicare      Policy#: [id number] ; Medicare [phone]     Subscriber: patient CM Name: Grayce      Phone#: [phone] option 3     Fax#: [phone] Pre-Cert#: [id number]   approved 8/12 until 8/19   Employer:  Benefits:  Phone #: [phone]     Name:  Eff. Date: 04/26/23-still active     Deduct: does not have one      Out of Pocket Max: $3,800 ($2,040.78 met)      Life Max: NA  CIR: $355/day co-pay for days 1-5, 100% coverage for days 6+      SNF: 100% coverage for days 1-20, $203 co-pay/day for days 21-100 Outpatient: $20 co-pay/visit     Co-Pay:  Home Health: 100% coverage      Co-Pay:  DME: 80% coverage     Co-Pay: 20% co-insurance Providers: in-network SECONDARY:       Policy#:       Phone#:   Artist:       Phone#:   The Data processing manager" for patients in Inpatient Rehabilitation Facilities with attached "Privacy Act Statement-Health Care Records" was provided and verbally reviewed with: Patient  Emergency Contact Information Contact Information     Name Relation Home Work Mobile   [place] Other   [phone]   Rolene Merlynn Benne [phone]  [phone]   McGrath,Denise Sister   [phone]      Other Contacts   None on File    Current Medical History  Patient Admitting Diagnosis: epidural hematoma and wound hematoma s/p I&D for evacuation of seroma History of Present Illness: Pt is a 78 year old male with medical hx significant for: posterior lumbar fusion L4-5 and L5-S1 (11/15/23). Pt presented to [facility] on 11/30/23 d/t low back pain and weakness. Pt reported bilateral leg pain ever since neurosurgical procedure ~2  weeks prior.   Pt has had falls. Imaging showed large what looks like epidural hematoma and wound hematoma. Pt underwent reexploration of lumbar wound for evacuation of hematoma by Dr. [name] on 12/01/23. Therapy evaluations completed and CIR recommended d/t pt's deficits in functional mobility.  Patient's medical record from [facility] has been reviewed by the rehabilitation admission coordinator and physician.  Past Medical History  Past Medical History:  Diagnosis Date   Abdominal pain, lower    Agitation    Allergy, unspecified, initial encounter    Anxiety disorder    Arthritis    Asthma    As a child   Bipolar disorder [facility])    Patient denies   Cancer [facility])    Bladder   Cancer (HCC)    Skin cancer   Chronic kidney disease, stage III (moderate) (HCC)    Chronic kidney disease, stage III (moderate) (HCC)    COPD (chronic obstructive pulmonary disease) (HCC)    Pt denies (10/2023)   Coronary atherosclerosis of native coronary artery    Esophageal reflux    Fatigue    Fungal granuloma    Hip pain    History of 2019 novel  coronavirus disease (COVID-19)    History of endoscopy 02/00/2011   History of kidney stones    Hypertension    Hypo-osmolality and hyponatremia    Hypokalemia    Hypothyroidism    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 now has 3oz of wine every other day (as of 10/2023)   Umbilical hernia    Unspecified fall, subsequent encounter    Vitamin D deficiency, unspecified     Wernicke's encephalopathy 06/23/2019   Has the patient had major surgery during 100 days prior to admission? Yes  Family History  family history includes Lung cancer in his father; Multiple myeloma in his mother.  Current Medications   Current Facility-Administered Medications:    acetaminophen  (TYLENOL ) tablet 650 mg, 650 mg, Oral, Q4H PRN, 650 mg at 12/01/23 0435 **OR** acetaminophen  (TYLENOL ) suppository 650 mg, 650 mg, Rectal, Q4H PRN, [name], MD   artificial tears ophthalmic solution 1 drop, 1 drop, Both Eyes, QID PRN, Lyle, Dwayne A, RPH   atorvastatin  (LIPITOR) tablet 40 mg, 40 mg, Oral, q AM, Johnanna Credit Caylin, PA-C, 40 mg at 12/05/23 9391   bisacodyl  (DULCOLAX) EC tablet 5 mg, 5 mg, Oral, Daily PRN, Meyran, Suzen Lacks, NP, 5 mg at 12/05/23 1052   chlorhexidine  (PERIDEX ) 0.12 % solution 15 mL, 15 mL, Mouth/Throat, Once **OR** Oral care mouth rinse, 15 mL, Mouth Rinse, Once, Leopoldo Bruckner, MD   clonazePAM  (KLONOPIN ) tablet 0.5 mg, 0.5 mg, Oral, QHS PRN, Johnanna Credit Caylin, PA-C, 0.5 mg at 12/04/23 2126   colchicine  tablet 0.6 mg, 0.6 mg, Oral, q AM, Tomlinson, Sara Caylin, PA-C, 0.6 mg at 12/05/23 0609   cyanocobalamin  (VITAMIN B12) tablet 500 mcg, 500 mcg, Oral, Daily, Johnanna Credit Caylin, PA-C, 500 mcg at 12/05/23 1053   cyclobenzaprine  (FLEXERIL ) tablet 5 mg, 5 mg, Oral, TID PRN, [name], MD, 5 mg at 12/05/23 1054   docusate sodium  (COLACE) capsule 100 mg, 100 mg, Oral, BID, Meyran, Suzen Lacks, NP, 100 mg at 12/05/23 1054   DULoxetine  (CYMBALTA ) DR capsule 60 mg, 60 mg, Oral, q AM, Johnanna Credit Caylin, PA-C, 60 mg at 12/05/23 0609   dutasteride  (AVODART ) capsule 0.5 mg, 0.5 mg, Oral, QPM, Johnanna Credit Caylin, PA-C, 0.5 mg at 12/04/23 1816   ezetimibe  (ZETIA ) tablet 10 mg, 10 mg, Oral, q AM, Tomlinson, Sara Caylin, PA-C, 10 mg at 12/05/23 9391   ferrous sulfate  tablet 325 mg, 325 mg, Oral, TID WC, Johnanna Credit Caylin, PA-C, 325 mg at  12/05/23 0757   HYDROmorphone  (DILAUDID ) injection 0.5 mg, 0.5 mg, Intravenous, Q2H PRN, [name], MD, 0.5 mg at 12/03/23 0620   irbesartan  (AVAPRO ) tablet 75 mg, 75 mg, Oral, Daily, Johnanna Credit Caylin, PA-C, 75 mg at 12/05/23 1055   labetalol  (NORMODYNE ) injection 10 mg, 10 mg, Intravenous, Q2H PRN, [name], MD   levothyroxine  (SYNTHROID ) tablet 100 mcg, 100 mcg, Oral, Every Sunday, Pierce, Dwayne A, RPH, 100 mcg at  12/03/23 9376   levothyroxine  (SYNTHROID ) tablet 50 mcg, 50 mcg, Oral, Once per day on Monday Tuesday Wednesday Thursday Friday Saturday, Tomlinson, Sara Caylin, PA-C, 50 mcg at 12/05/23 9391   loperamide  (IMODIUM ) capsule 2 mg, 2 mg, Oral, QID PRN, Tomlinson, Sara Caylin, PA-C   menthol -cetylpyridinium (CEPACOL) lozenge 3 mg, 1 lozenge, Oral, PRN, 3 mg at 12/01/23 2300 **OR** phenol (CHLORASEPTIC) mouth spray 1 spray, 1 spray, Mouth/Throat, PRN, [name], MD   oxyCODONE  (Oxy IR/ROXICODONE ) immediate release tablet 5 mg, 5 mg, Oral, Q4H PRN, [name], MD, 5 mg at 12/05/23 1052   pantoprazole  (PROTONIX ) EC tablet 20 mg, 20 mg, Oral, Daily, Johnanna Credit Caylin, PA-C, 20 mg at 12/05/23 1055   sodium chloride  flush (NS) 0.9 % injection 3 mL, 3 mL, Intravenous, Q12H, [name], MD, 3 mL at 12/05/23 1055   sodium chloride  flush (NS) 0.9 % injection 3 mL, 3 mL, Intravenous, PRN, Cram, Gary, MD   tamsulosin  (FLOMAX ) capsule 0.4 mg, 0.4 mg, Oral, Daily, Johnanna Credit Caylin, PA-C, 0.4 mg at 12/05/23 1055   thiamine  (VITAMIN B1) tablet 100 mg, 100 mg, Oral, Daily, Johnanna Credit Caylin, PA-C, 100 mg at 12/05/23 1054  Patients Current Diet:  Diet Order             Diet regular Room service appropriate? Yes; Fluid consistency: Thin  Diet effective now                  Precautions / Restrictions Precautions Precautions: Back, Fall Precaution Booklet Issued: Yes (comment) Precaution/Restrictions Comments: only recalls 2/3 back precautions, needs cues for compliance;  multiple recent falls Spinal Brace: Lumbar corset, Applied in sitting position Restrictions Weight Bearing Restrictions Per Provider Order: No   Has the patient had 2 or more falls or a fall with injury in the past year?Yes  Prior Activity Level Community (5-7x/wk): gets out of house ~3 days/week (MD appointments)  Prior Functional Level Prior Function Prior Level of Function : Independent/Modified Independent, History of Falls (last six months) Mobility Comments: Has been needing a little help and has been using RW more than cane after recent back surgery. Prior to back surgery, pt was mod I using cane mostly but has RW available when needed. Pt reports ~x6 falls since January ADLs Comments: RN manages meds and does driving and also does cooking and cleaning; pt dresses and bathes himself  Self Care: Did the patient need help bathing, dressing, using the toilet or eating?  Independent  Indoor Mobility: Did the patient need assistance with walking from room to room (with or without device)? Independent  Stairs: Did the patient need assistance with internal or external stairs (with or without device)? Independent (for the entry stairs into the house. Avoids steps in general)  Functional Cognition: Did the patient need help planning regular tasks such as shopping or remembering to take medications? Needed some help  Patient Information Are you of Hispanic, Latino/a,or Spanish origin?: A. No, not of Hispanic, Latino/a, or Spanish origin What is your race?: A. White Do you need or want an interpreter to communicate with a doctor or health care staff?: 0. No  Patient's Response To:  Health Literacy and Transportation Is the patient able to respond to health literacy and transportation needs?: Yes Health Literacy - How often do you need to have someone help you when you read instructions, pamphlets, or other written material from your doctor or pharmacy?: Never In the past 12 months, has  lack of transportation kept you  from medical appointments or from getting medications?: No In the past 12 months, has lack of transportation kept you from meetings, work, or from getting things needed for daily living?: No  Journalist, newspaper / Equipment Home Equipment: Shower seat, Agricultural consultant (2 wheels), [employer] - single point  Prior Device Use: Indicate devices/aids used by the patient prior to current illness, exacerbation or injury? Walker and cane  Current Functional Level Cognition  Orientation Level: Oriented X4    Extremity Assessment (includes Sensation/Coordination)  Upper Extremity Assessment: Generalized weakness  Lower Extremity Assessment: Defer to PT evaluation RLE Deficits / Details: MMT scores of 4- hip flexion, 4- knee extension, 3+ ankle dorsiflexion; denied numbness/tingling; reports L leg was more painful and an issue prior to back surgery but now his R leg is more painful and weak LLE Deficits / Details: MMT scores of 4- hip flexion, 4+ knee extension, 4- ankle dorsiflexion; denied numbness/tingling; reports L leg was more painful and an issue prior to back surgery but now his R leg is more painful and weak    ADLs  Overall ADL's : Needs assistance/impaired Eating/Feeding: Independent, Sitting Grooming: Set up, Sitting Upper Body Bathing: Set up, Sitting Lower Body Bathing: Moderate assistance, Sit to/from stand Upper Body Dressing : Set up, Sitting Lower Body Dressing: Moderate assistance, Sit to/from stand Toilet Transfer: Minimal assistance Toileting- Clothing Manipulation and Hygiene: Minimal assistance, Sitting/lateral lean Functional mobility during ADLs: Minimal assistance, Rolling walker (2 wheels) General ADL Comments: limited by BLE weakness, dizziness and poor activity tolerance    Mobility  Overal bed mobility: Needs Assistance Bed Mobility: Rolling, Sidelying to Sit Rolling: Supervision Sidelying to sit: Contact guard assist Sit to  sidelying: Contact guard assist General bed mobility comments: cues for log roll technique, CGA for safety    Transfers  Overall transfer level: Needs assistance Equipment used: Rolling walker (2 wheels) Transfers: Sit to/from Stand Sit to Stand: Min assist, Contact guard assist General transfer comment: cues for hand placement with fair carryover. min A to steady on rise and gain standing balance on initial stand from EOB, CGA from recliner x2    Ambulation / Gait / Stairs / Wheelchair Mobility  Ambulation/Gait Ambulation/Gait assistance: Min assist, +2 safety/equipment (chair follow) Gait Distance (Feet): 45 Feet (+ 55') Assistive device: Rolling walker (2 wheels) Gait Pattern/deviations: Step-through pattern, Decreased step length - right, Decreased step length - left, Decreased stride length, Knee flexed in stance - right, Knee flexed in stance - left, Trunk flexed General Gait Details: Pt takes slow, small, unsteady steps. Pt RLE crossing midline in swing phase and R knee flexing with valgus in stance. Repeated multi-modal cues provided to extend his knees during stance phase and for wider BOS, but poor carryover noted. MinA provided continuously for balance and safety with close chair follow as pt fatigues quickly needing x1 seated rest break Gait velocity: variable Gait velocity interpretation: <1.31 ft/sec, indicative of household ambulator    Posture / Balance Dynamic Sitting Balance Sitting balance - Comments: sitting EOB Balance Overall balance assessment: Needs assistance, History of Falls Sitting-balance support: No upper extremity supported, Feet supported Sitting balance-Leahy Scale: Fair Sitting balance - Comments: sitting EOB Standing balance support: Bilateral upper extremity supported, During functional activity, Reliant on assistive device for balance Standing balance-Leahy Scale: Poor Standing balance comment: reliant on RW and minA during dynamic tasks, able to  maintain standing at sink with single UE support to brush teeth    Special considerations/ Life events Skin Ecchymosis: arm/left;  Wound: Surgical Incision-back     Previous Home Environment  Living Arrangements: Non-relatives/Friends  Lives With: Other (Comment) (home health nurse) Available Help at Discharge: Home health, Available 24 hours/day, Personal care attendant Type of Home: House Home Layout: Able to live on main level with bedroom/bathroom, Two level Alternate Level Stairs-Number of Steps: n/a Home Access: Stairs to enter Entrance Stairs-Rails: Left, Right, Can reach both Entrance Stairs-Number of Steps: 2 Bathroom Shower/Tub: Health visitor: Standard Bathroom Accessibility: Yes How Accessible: Accessible via walker Home Care Services: No Additional Comments: 1 fall in feb  Discharge Living Setting Plans for Discharge Living Setting: Patient's home Type of Home at Discharge: House Discharge Home Layout: Able to live on main level with bedroom/bathroom Discharge Home Access: Stairs to enter Entrance Stairs-Rails: Can reach both Entrance Stairs-Number of Steps: 2 Discharge Bathroom Shower/Tub: Walk-in shower Discharge Bathroom Toilet: Standard Discharge Bathroom Accessibility: Yes How Accessible: Accessible via walker Does the patient have any problems obtaining your medications?: No  Social/Family/Support Systems Anticipated Caregiver: [name] Ford, friend/home health nurse Anticipated Caregiver's Contact Information: [phone] Caregiver Availability: 24/7 Discharge Plan Discussed with Primary Caregiver: Yes Is Caregiver In Agreement with Plan?: Yes Does Caregiver/Family have Issues with Lodging/Transportation while Pt is in Rehab?: No  Goals Patient/Family Goal for Rehab: Mod I-Supervision: PT/OT Expected length of stay: 11-13 days Pt/Family Agrees to Admission and willing to participate: Yes Program Orientation Provided & Reviewed with  Pt/Caregiver Including Roles  & Responsibilities: Yes  Decrease burden of Care through IP rehab admission: NA  Possible need for SNF placement upon discharge:Not anticipated  Patient Condition: This patient's condition remains as documented in the consult dated 12/04/23, in which the Rehabilitation Physician determined and documented that the patient's condition is appropriate for intensive rehabilitative care in an inpatient rehabilitation facility. Will admit to inpatient rehab today.  Preadmission Screen Completed By:  Tinnie SHAUNNA Yvone Delayne, CCC-SLP, 12/05/2023 2:15 PM ______________________________________________________________________   Discussed status with Dr. Babs on 12/05/23 at 1416 and received approval for admission today.  Admission Coordinator:  Tinnie SHAUNNA Yvone Delayne, time 8583 Date 12/05/23

## 2023-12-03 NOTE — Plan of Care (Signed)

## 2023-12-04 ENCOUNTER — Encounter (HOSPITAL_COMMUNITY): Payer: Self-pay | Admitting: Neurosurgery

## 2023-12-04 DIAGNOSIS — S343XXD Injury of cauda equina, subsequent encounter: Secondary | ICD-10-CM

## 2023-12-04 DIAGNOSIS — G8918 Other acute postprocedural pain: Secondary | ICD-10-CM | POA: Diagnosis not present

## 2023-12-04 NOTE — Progress Notes (Signed)
 Subjective: Patient reports doing better, able to walk to the bathroom with a walker without having to sit down and take breaks  Objective: Vital signs in last 24 hours: Temp:  [97.7 F (36.5 C)-98.3 F (36.8 C)] 98 F (36.7 C) (08/11 0743) Pulse Rate:  [85-91] 86 (08/11 0743) Resp:  [16] 16 (08/11 0446) BP: (90-144)/(61-77) 141/77 (08/11 0743) SpO2:  [91 %-95 %] 95 % (08/11 0743)  Intake/Output from previous day: 08/10 0701 - 08/11 0700 In: 1000 [P.O.:400; IV Piggyback:600] Out: 450 [Urine:450] Intake/Output this shift: No intake/output data recorded.  Neurologic: Grossly normal  Lab Results: Lab Results  Component Value Date   WBC 8.7 11/30/2023   HGB 11.5 (L) 11/30/2023   HCT 34.9 (L) 11/30/2023   MCV 95.4 11/30/2023   PLT 278 11/30/2023   Lab Results  Component Value Date   INR 1.1 08/11/2023   BMET Lab Results  Component Value Date   NA 135 11/30/2023   K 4.5 11/30/2023   CL 109 11/30/2023   CO2 20 (L) 11/30/2023   GLUCOSE 116 (H) 11/30/2023   BUN 22 11/30/2023   CREATININE 1.38 (H) 11/30/2023   CALCIUM  8.5 (L) 11/30/2023    Studies/Results: No results found.  Assessment/Plan: S/p lumbar wound washout. Doing better. Waiting for rehab placement. Continue therapy   LOS: 3 days    Suzen Lacks [facility] 12/04/2023, 9:09 AM

## 2023-12-04 NOTE — Progress Notes (Signed)
 Inpatient Rehab Admissions Coordinator:  ?Insurance authorization started. Will continue to follow. ? ? ?Tinnie Yvone Cohens, MS, CCC-SLP ?Admissions Coordinator ?[phone] ? ?

## 2023-12-04 NOTE — Progress Notes (Signed)
 Physical Therapy Treatment Patient Details Name: Miguel Williams MRN: [id number] DOB: 1945/04/26 Today's Date: 12/04/2023   History of Present Illness Pt is a 78 yo man who presented 11/30/23 with low back pain and weakness along with several falls since his elective PLIF L4-S1 with decompressive laminectomies on 11/15/23. Workup has revealed large what looks like epidural hematoma and wound hematoma. S/p reexploration of lumbar wound for evacuation of hematoma 8/8. PMH of anxiety, cancer, CKD III, HLD, HTN, hypothyroidism    PT Comments  Pt resting in bed on arrival, pleasant and agreeable to session with pt demonstrating continued progress towards acute goals. Pt continues to be limited in safe mobility by LE weakness, decreased activity tolerance and pain. Pt progressing gait distance this session with RW for support and min A to maintain balance with close chair follow for safety as pt with continued knee flexion in stance with mild buckling noted x3 with pt able to self correct in all instances. Pt up in chair at end of session with all needs met. Continued education on importance of frequent mobilization with pt verbalizing understanding. Pt continues to remain at high risk for falls and will benefit from intensive inpatient follow-up therapy, >3 hours/day, will continue to follow acutely.    If plan is discharge home, recommend the following: A little help with bathing/dressing/bathroom;Assistance with cooking/housework;Assist for transportation;Help with stairs or ramp for entrance;A little help with walking and/or transfers;Direct supervision/assist for medications management;Direct supervision/assist for financial management;Supervision due to cognitive status   Can travel by private vehicle        Equipment Recommendations  BSC/3in1;Wheelchair (measurements PT);Wheelchair cushion (measurements PT) (pending progress)    Recommendations for Other Services       Precautions / Restrictions  Precautions Precautions: Back;Fall Precaution Booklet Issued: Yes (comment) (per chart, received it last admission) Recall of Precautions/Restrictions: Impaired Precaution/Restrictions Comments: only recalls 2/3 back precautions, needs cues for compliance; multiple recent falls Required Braces or Orthoses: Spinal Brace Spinal Brace: Lumbar corset;Applied in sitting position Restrictions Weight Bearing Restrictions Per Provider Order: No     Mobility  Bed Mobility Overal bed mobility: Needs Assistance Bed Mobility: Rolling, Sidelying to Sit Rolling: Supervision Sidelying to sit: Supervision, HOB elevated       General bed mobility comments: Pt able to perform all bed mobility aspects with supervision for safety    Transfers Overall transfer level: Needs assistance Equipment used: Rolling walker (2 wheels) Transfers: Sit to/from Stand Sit to Stand: Contact guard assist           General transfer comment: cues for hand placement, but no assist to rise from EOB at lowest height, recliner and BSC    Ambulation/Gait Ambulation/Gait assistance: Min assist, +2 safety/equipment (chair follow) Gait Distance (Feet): 55 Feet Assistive device: Rolling walker (2 wheels) Gait Pattern/deviations: Step-through pattern, Decreased step length - right, Decreased step length - left, Decreased stride length, Knee flexed in stance - right, Knee flexed in stance - left, Trunk flexed Gait velocity: reduced     General Gait Details: Pt takes slow, small, unsteady steps. Pt RLE crossing midline in swing and R knee flexing with valgus in stance. Repeated multi-modal cues provided to extend his knees during stance phase and for wider BOS, but poor carryover noted. MinA provided continuously for balance and safety with close chair follow.   Stairs             Wheelchair Mobility     Tilt Bed    Modified Rankin (Stroke Patients  Only)       Balance Overall balance assessment: Needs  assistance, History of Falls Sitting-balance support: No upper extremity supported, Feet supported Sitting balance-Leahy Scale: Fair Sitting balance - Comments: sitting EOB   Standing balance support: Bilateral upper extremity supported, During functional activity, Reliant on assistive device for balance Standing balance-Leahy Scale: Poor Standing balance comment: reliant on RW and minA during dynamic tasks, able to maintain standing at sink with single UE support to brush teeth                            Communication Communication Communication: No apparent difficulties Factors Affecting Communication: Hearing impaired  Cognition Arousal: Alert Behavior During Therapy: WFL for tasks assessed/performed, Impulsive   PT - Cognitive impairments: Memory, Awareness, No family/caregiver present to determine baseline, Attention, Safety/Judgement, Problem solving                       PT - Cognition Comments: Only recalls 2/3 precautions. Pt needs redirection to task as pt easily interally and externall distractible Following commands: Impaired Following commands impaired: Follows one step commands with increased time, Follows multi-step commands with increased time, Follows multi-step commands inconsistently    Cueing Cueing Techniques: Verbal cues, Tactile cues, Visual cues  Exercises      General Comments General comments (skin integrity, edema, etc.): VSS on RA      Pertinent Vitals/Pain Pain Assessment Pain Assessment: Faces Faces Pain Scale: Hurts a little bit Pain Location: back Pain Descriptors / Indicators: Discomfort, Grimacing, Guarding, Operative site guarding Pain Intervention(s): Premedicated before session, Monitored during session, Limited activity within patient's tolerance    Home Living                          Prior Function            PT Goals (current goals can now be found in the care plan section) Acute Rehab PT  Goals Patient Stated Goal: to improve PT Goal Formulation: With patient Time For Goal Achievement: 12/17/23 Progress towards PT goals: Progressing toward goals    Frequency    Min 3X/week      PT Plan      Co-evaluation              AM-PAC PT 6 Clicks Mobility   Outcome Measure  Help needed turning from your back to your side while in a flat bed without using bedrails?: A Little Help needed moving from lying on your back to sitting on the side of a flat bed without using bedrails?: A Little Help needed moving to and from a bed to a chair (including a wheelchair)?: A Little Help needed standing up from a chair using your arms (e.g., wheelchair or bedside chair)?: A Little Help needed to walk in hospital room?: A Lot (chair follow) Help needed climbing 3-5 steps with a railing? : A Lot 6 Click Score: 16    End of Session Equipment Utilized During Treatment: Back brace Activity Tolerance: Patient tolerated treatment well Patient left: with call bell/phone within reach;in chair;with chair alarm set Nurse Communication: Mobility status PT Visit Diagnosis: Unsteadiness on feet (R26.81);Pain;Other abnormalities of gait and mobility (R26.89);Muscle weakness (generalized) (M62.81);History of falling (Z91.81);Repeated falls (R29.6);Difficulty in walking, not elsewhere classified (R26.2) Pain - Right/Left:  (back) Pain - part of body:  (back)     Time: 8899-8875 PT Time Calculation (min) (ACUTE  ONLY): 24 min  Charges:    $Gait Training: 8-22 mins $Therapeutic Activity: 8-22 mins PT General Charges $$ ACUTE PT VISIT: 1 Visit                     Laporchia Nakajima R. PTA Acute Rehabilitation Services Office: [phone]   Therisa CHRISTELLA Boor 12/04/2023, 12:49 PM

## 2023-12-04 NOTE — Consult Note (Signed)
 Physical Medicine and Rehabilitation Consult Reason for Consult:bilateral LE weakness Referring Physician: [name]   HPI: Miguel Williams is a 78 y.o. male with history of bipolar disorder, chronic kidney disease stage III, bladder cancer, who developed progressive worsening back and bilateral hip pain with radiation to both legs consistent with neurogenic claudication.  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  Patient ultimately required and on 11/15/2023 patient underwent decompressive laminectomies at L4-L5 as well as L5-S1 with facetectomies and foraminotomies in addition to PLIF and screw fixation by Dr. Arley cram.  Patient was discharged home but then presented again on 12/01/2023 with progressive pain in both his back and legs.  Imaging revealed a large epidural hematoma at the surgical site.  Patient underwent reexploration of the surgical wound with evacuation of seromatous fluid the same day.  Patient with continued postoperative pain although somewhat improved.  The patient mobilized with therapies yesterday and was min assist for sit to stand transfers and was able to walk 8 feet with min assist using a rolling walker.  Patient lives at home but is had multiple falls over the last several months.  He has a nurse that visits the home who manages meds and does household chores as well as driving.  The patient still had been ambulating with a rolling walker and dressed and bathed himself independently prior to this admission.   Home: Home Living Family/patient expects to be discharged to:: Private residence Living Arrangements: Non-relatives/Friends [name], Home care nurse) Available Help at Discharge: Home health, Available 24 hours/day [facility] RN) Type of Home: House Home Access: Stairs to enter [employer] of Steps: 2 Entrance Stairs-Rails: Left, Right, Can reach both Home Layout: Able to live on main level with bedroom/bathroom, Two level Bathroom  Shower/Tub: Health visitor: Standard Bathroom Accessibility: Yes Home Equipment: Information systems manager, Agricultural consultant (2 wheels), [employer] - single point  Lives With: Other (Comment) (home health nurse)  Functional History: Prior Function Prior Level of Function : Independent/Modified Independent, History of Falls (last six months) Mobility Comments: Has been needing a little help and has been using RW more than cane after recent back surgery. Prior to back surgery, pt was mod I using cane mostly but has RW available when needed. Pt reports ~x6 falls since January ADLs Comments: RN manages meds and does driving and also does cooking and cleaning; pt dresses and bathes himself Functional Status:  Mobility: Bed Mobility Overal bed mobility: Needs Assistance Bed Mobility: Rolling, Sidelying to Sit, Sit to Sidelying Rolling: Supervision Sidelying to sit: Supervision, HOB elevated Sit to sidelying: Supervision, HOB elevated General bed mobility comments: Pt prompted by PT for complying to spinal precautions with bed mobility. Pt able to perform all bed mobility aspects with supervision for safety Transfers Overall transfer level: Needs assistance Equipment used: Rolling walker (2 wheels) Transfers: Sit to/from Stand Sit to Stand: Min assist, Contact guard assist General transfer comment: The first rep to stand from EOB, pt was leaning posteriorly and needed minA for balance. Upon the second rep from EOB, pt improved his anterior weight shift and progressed to CGA for safety. Pt performed x2 more reps from a chair with CGA-minA for balance. Ambulation/Gait Ambulation/Gait assistance: Min assist Gait Distance (Feet): 8 Feet (x3 bouts of ~6-8 ft per bout) Assistive device: Rolling walker (2 wheels) Gait Pattern/deviations: Step-through pattern, Decreased step length - right, Decreased step length - left, Decreased stride length, Knee flexed in stance - right, Knee  flexed in stance - left,  Trunk flexed General Gait Details: Pt takes slow, small, unsteady steps. He tends to flex his knees during stance phase and seems unsteady like his knees are close to buckling. Repeated multi-modal cues provided to extend his knees during stance phase, but poor carryover noted. MinA provided continuously for balance and safety with close chair follow. Gait velocity: reduced Gait velocity interpretation: <1.31 ft/sec, indicative of household ambulator    ADL:    Cognition: Cognition Orientation Level: Oriented X4 Cognition Arousal: Alert Behavior During Therapy: WFL for tasks assessed/performed   Review of Systems  Constitutional: Negative.   HENT: Negative.    Eyes: Negative.   Respiratory: Negative.    Cardiovascular: Negative.   Gastrointestinal:  Positive for constipation. Negative for nausea.  Genitourinary:  Positive for urgency.  Musculoskeletal:  Positive for back pain, falls and myalgias.  Skin: Negative.   Neurological:  Positive for focal weakness and weakness.  Psychiatric/Behavioral: Negative.     Past Medical History:  Diagnosis Date   Abdominal pain, lower    Agitation    Allergy, unspecified, initial encounter    Anxiety disorder    Arthritis    Asthma    As a child   Bipolar disorder [facility])    Patient denies   Cancer [facility])    Bladder   Cancer (HCC)    Skin cancer   Chronic kidney disease, stage III (moderate) (HCC)    Chronic kidney disease, stage III (moderate) (HCC)    COPD (chronic obstructive pulmonary disease) (HCC)    Pt denies (10/2023)   Coronary atherosclerosis of native coronary artery    Esophageal reflux    Fatigue    Fungal granuloma    Hip pain    History of 2019 novel coronavirus disease (COVID-19)    History of endoscopy 02/00/2011   History of kidney stones    Hypertension    Hypo-osmolality and hyponatremia    Hypokalemia    Hypothyroidism    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 now has 3oz of wine every other day (as of 10/2023)   Umbilical hernia    Unspecified fall, subsequent encounter    Vitamin D deficiency, unspecified    Wernicke's encephalopathy 06/23/2019   Past Surgical History:  Procedure Laterality Date   CATARACT EXTRACTION W/ INTRAOCULAR LENS IMPLANT Bilateral    COLONOSCOPY  04/2023   COLONOSCOPY W/ ENDOSCOPIC US      CYSTOSCOPY WITH FULGERATION N/A 03/09/2023   Procedure: CYSTOSCOPY WITH FULGERATION WITH CLOT EVACUATION;  Surgeon: Devere Lonni Righter, MD;  Location: [facility];  Service: Urology;  Laterality: N/A;   CYSTOSCOPY WITH URETHRAL DILATATION N/A 03/08/2023   Procedure: CYSTOSCOPY;  Surgeon: Devere Lonni Righter, MD;  Location: [facility];  Service: Urology;  Laterality: N/A;  30 MINUTES   ESOPHAGOGASTRODUODENOSCOPY     TONSILLECTOMY  04/25/1950   Family History  Problem Relation Age of  Onset   Multiple myeloma Mother    Lung cancer Father    Social History:  reports that he has never smoked. He has never used smokeless tobacco. He reports current alcohol  use. He reports that he does not currently use drugs. Allergies:  Allergies  Allergen Reactions   Baclofen Other (See Comments)    Hallucinations- required admit to hospital for adverse drug rxn.   Bacitracin-Polymyxin B Hives   Tegretol  [Carbamazepine ] Hives   Allopurinol Other (See Comments)    Had a bad reaction   Neomycin Other (See Comments)    Allergic skin reaction   Sulfa Antibiotics Hives and Rash    Medications Prior to Admission  Medication Sig Dispense Refill   acetaminophen  (TYLENOL ) 500 MG tablet Take 1 tablet (500 mg total) by mouth every 6 (six) hours as needed for mild pain, moderate pain or fever. 30 tablet 0   atorvastatin  (LIPITOR) 40 MG tablet Take 40 mg by mouth in the morning.     clonazePAM  (KLONOPIN ) 0.5 MG tablet Take 0.5 mg by mouth at bedtime as needed (sleep/anxiety).     colchicine  0.6 MG tablet Take 0.6 mg by mouth in the morning.     cyanocobalamin  (VITAMIN B12) 500 MCG tablet Take 500 mcg by mouth daily.     DULoxetine  (CYMBALTA ) 60 MG capsule Take 60 mg by mouth in the morning.     dutasteride  (AVODART ) 0.5 MG capsule Take 1 capsule (0.5 mg total) by mouth every other day. (Patient taking differently: Take 0.5 mg by mouth every evening.) 30 capsule 0   ezetimibe  (ZETIA ) 10 MG tablet Take 10 mg by mouth in the morning.     ferrous sulfate  325 (65 FE) MG EC tablet Take 325 mg by mouth 3 (three) times daily with meals.     HYDROcodone -acetaminophen  (NORCO/VICODIN) 5-325 MG tablet Take 2 tablets by mouth every 4 (four) hours as needed for severe pain (pain score 7-10). 30 tablet 0   Inulin  (FIBER CHOICE PO) Take 2 capsules by mouth daily.     lansoprazole (PREVACID) 30 MG capsule Take 30 mg by mouth in the morning and at bedtime.     levothyroxine  (SYNTHROID ) 50 MCG tablet Take 50-100 mcg by mouth See admin instructions. Take 2 tablets (100 mcg) by mouth in the morning on Sundays.  Take 1 tablet (50 mcg) by mouth in the morning on Mondays, Tuesdays, Wednesdays, Thursdays, Fridays & Saturdays.     SYSTANE COMPLETE PF 0.6 % SOLN Place 1 drop into both eyes 4 (four) times daily as needed (for dryness).     Tamsulosin  HCl (FLOMAX ) 0.4 MG CAPS TAKE 1 CAPSULE (0.4 MG TOTAL) BY MOUTH DAILY. 30 capsule 6   tiZANidine  (ZANAFLEX ) 2 MG tablet Take 2 mg by mouth every 6 (six) hours as needed for muscle spasms.     traMADol  (ULTRAM ) 50 MG tablet Take 50 mg by mouth every 6 (six)  hours as needed (pain.).     valsartan (DIOVAN) 80 MG tablet Take 80 mg by mouth at bedtime.     VITAMIN D, CHOLECALCIFEROL , PO Take 1 capsule by mouth daily.     betamethasone dipropionate 0.05 % cream Apply 1 Application topically 2 (two) times daily as needed (skin irritation.).     calcium -vitamin D (OSCAL WITH D) 500-5 MG-MCG tablet Take 1 tablet by mouth.     loperamide  (IMODIUM  A-D) 2 MG tablet Take 2 mg by mouth 4 (four) times daily as needed for diarrhea or loose stools.  probenecid  (BENEMID ) 500 MG tablet Take 1 tablet (500 mg total) by mouth daily. (Patient not taking: Reported on 12/01/2023) 30 tablet 1   thiamine  (VITAMIN B-1) 100 MG tablet Take 1 tablet (100 mg total) by mouth daily.       Blood pressure (!) 141/77, pulse 86, temperature 98 F (36.7 C), temperature source Oral, resp. rate 20, height 5' 8 (1.727 m), weight 88.5 kg, SpO2 95%. Physical Exam Constitutional:      General: He is not in acute distress.    Appearance: He is obese. He is not ill-appearing.  HENT:     Head: Normocephalic.     Right Ear: External ear normal.     Left Ear: External ear normal.     Nose: Nose normal.     Mouth/Throat:     Pharynx: No oropharyngeal exudate or posterior oropharyngeal erythema.  Eyes:     Extraocular Movements: Extraocular movements intact.     Pupils: Pupils are equal, round, and reactive to light.  Cardiovascular:     Rate and Rhythm: Normal rate.     Pulses: Normal pulses.  Pulmonary:     Effort: Pulmonary effort is normal.  Abdominal:     Palpations: Abdomen is soft.  Musculoskeletal:        General: No swelling or tenderness. Normal range of motion.     Cervical back: Normal range of motion.  Skin:    Comments: Back incision dressed. No visible drainage present.   Neurological:     Mental Status: He is alert.     Comments: Alert and oriented x 3. Normal insight and awareness. Intact Memory. Normal language and speech. Cranial nerve exam unremarkable.  MMT: BUE 5/5 prox to distal. RLE 3+ HF, KE, 4/5 ADF and APF. LLE 4- HF, KE and 4/5 ADF, 4+ APF. Sensory exam normal for light touch and pain in all 4 limbs. No limb ataxia or cerebellar signs. No abnormal tone appreciated. DTR's were at knees and absent at ankles.     Psychiatric:        Mood and Affect: Mood normal.        Behavior: Behavior normal.     No results found for this or any previous visit (from the past 24 hours). No results found.  Assessment/Plan: Diagnosis: Lumbar stenosis with neurogenic claudication status post decompression and fusion as well as exploration and evacuation of seroma.  Does the need for close, 24 hr/day medical supervision in concert with the patient's rehab needs make it unreasonable for this patient to be served in a less intensive setting? Yes Co-Morbidities requiring supervision/potential complications:  -Significant postoperative pain -CKD 3 -neurogenic bowel -wound care considerations Due to bladder management, bowel management, safety, skin/wound care, disease management, medication administration, pain management, and patient education, does the patient require 24 hr/day rehab nursing? Yes Does the patient require coordinated care of a physician, rehab nurse, therapy disciplines of PT, OT to address physical and functional deficits in the context of the above medical diagnosis(es)? Yes Addressing deficits in the following areas: balance, endurance, locomotion, strength, transferring, bowel/bladder control, bathing, dressing, feeding, grooming, toileting, and psychosocial support Can the patient actively participate in an intensive therapy program of at least 3 hrs of therapy per day at least 5 days per week? Yes The potential for patient to make measurable gains while on inpatient rehab is excellent Anticipated functional outcomes upon discharge from inpatient rehab are modified independent and supervision  with PT, modified independent and  supervision with  OT, n/a with SLP. Estimated rehab length of stay to reach the above functional goals is: 11-13 days Anticipated discharge destination: Home Overall Rehab/Functional Prognosis: excellent  POST ACUTE RECOMMENDATIONS: This patient's condition is appropriate for continued rehabilitative care in the following setting: CIR Patient has agreed to participate in recommended program. Yes Note that insurance prior authorization may be required for reimbursement for recommended care.  Comment: Pt has home nurse who can provide some physical assistance as needed.    MEDICAL RECOMMENDATIONS: Pt is requiring frequent IV dilaudid . Probably would benefit from a long-acting opiate in the short term to more consistently control pain. Low dose oxycontin  CR 10mg  q12 might be a starting point.    I have personally performed a face to face diagnostic evaluation of this patient. Additionally, I have examined the patient's medical record including any pertinent labs and radiographic images.    Thanks,  Arthea ONEIDA Gunther, MD 12/04/2023

## 2023-12-04 NOTE — Evaluation (Signed)
 Occupational Therapy Evaluation Patient Details Name: Miguel Williams MRN: [id number] DOB: 1945/11/22 Today's Date: 12/04/2023   History of Present Illness   Pt is a 78 yo man who presented 11/30/23 with low back pain and weakness along with several falls since his elective PLIF L4-S1 with decompressive laminectomies on 11/15/23. Workup has revealed large what looks like epidural hematoma and wound hematoma. S/p reexploration of lumbar wound for evacuation of hematoma 8/8. PMH of anxiety, cancer, CKD III, HLD, HTN, hypothyroidism     Clinical Impressions Chaddrick was evaluated s/p the above admission list. He has been needing minimal assist for mobility with RW but managing BADLs with mod I since recent back surgery. HHRN completes IADLs. Upon evaluation the pt was limited by BLE weakness, poor balance, dizziness with standing, poor insight to safety and spinal precautions. Overall he needed min A for transfers and only tolerated ~76ft mobility with RW due to dizziness. Pt unable to tolerate standing to get a true set of orthostatics. Due to the deficits listed below the pt also needs up to mod A for LB ADLs. Pt will benefit from continued acute OT services and intensive inpatient follow up therapy, >3 hours/day after discharge.   Sitting 114/75 After standing for 1 minute w/pt report of dizziness and urgent need to sit 101/67      If plan is discharge home, recommend the following:   A little help with walking and/or transfers;A lot of help with bathing/dressing/bathroom;Assistance with cooking/housework;Assist for transportation;Direct supervision/assist for medications management     Functional Status Assessment   Patient has had a recent decline in their functional status and demonstrates the ability to make significant improvements in function in a reasonable and predictable amount of time.     Equipment Recommendations   BSC/3in1     Recommendations for Other Services   Rehab  consult     Precautions/Restrictions   Precautions Precautions: Back;Fall Precaution Booklet Issued: Yes (comment) Recall of Precautions/Restrictions: Impaired Precaution/Restrictions Comments: only recalls 2/3 back precautions, needs cues for compliance; multiple recent falls Required Braces or Orthoses: Spinal Brace Spinal Brace: Lumbar corset;Applied in sitting position Restrictions Weight Bearing Restrictions Per Provider Order: No     Mobility Bed Mobility Overal bed mobility: Needs Assistance Bed Mobility: Sit to Sidelying, Rolling Rolling: Supervision       Sit to sidelying: Contact guard assist General bed mobility comments: cues for log roll to get back into bed    Transfers Overall transfer level: Needs assistance Equipment used: Rolling walker (2 wheels) Transfers: Sit to/from Stand Sit to Stand: Min assist           General transfer comment: cues for hand placement with fair carryover.      Balance Overall balance assessment: Needs assistance, History of Falls Sitting-balance support: No upper extremity supported, Feet supported Sitting balance-Leahy Scale: Fair     Standing balance support: Bilateral upper extremity supported, During functional activity, Reliant on assistive device for balance Standing balance-Leahy Scale: Poor                             ADL either performed or assessed with clinical judgement   ADL Overall ADL's : Needs assistance/impaired Eating/Feeding: Independent;Sitting   Grooming: Set up;Sitting   Upper Body Bathing: Set up;Sitting   Lower Body Bathing: Moderate assistance;Sit to/from stand   Upper Body Dressing : Set up;Sitting   Lower Body Dressing: Moderate assistance;Sit to/from stand   Toilet Transfer: Minimal  assistance   Toileting- Clothing Manipulation and Hygiene: Minimal assistance;Sitting/lateral lean       Functional mobility during ADLs: Minimal assistance;Rolling walker (2  wheels) General ADL Comments: limited by BLE weakness, dizziness and poor activity tolerance     Vision Baseline Vision/History: 1 Wears glasses Vision Assessment?: No apparent visual deficits     Perception Perception: Not tested       Praxis Praxis: Not tested       Pertinent Vitals/Pain Pain Assessment Pain Assessment: Faces Faces Pain Scale: Hurts little more Pain Location: back Pain Descriptors / Indicators: Discomfort, Grimacing, Guarding, Operative site guarding Pain Intervention(s): Limited activity within patient's tolerance, Monitored during session     Extremity/Trunk Assessment Upper Extremity Assessment Upper Extremity Assessment: Generalized weakness   Lower Extremity Assessment Lower Extremity Assessment: Defer to PT evaluation   Cervical / Trunk Assessment Cervical / Trunk Assessment: Back Surgery   Communication Communication Communication: No apparent difficulties Factors Affecting Communication: Hearing impaired   Cognition Arousal: Alert Behavior During Therapy: WFL for tasks assessed/performed, Impulsive Cognition: Cognition impaired   Orientation impairments: Situation Awareness: Online awareness impaired Memory impairment (select all impairments): Working memory Attention impairment (select first level of impairment): Alternating attention Executive functioning impairment (select all impairments): Problem solving, Reasoning OT - Cognition Comments: poor awareness and insight to safety. needed cues for back precautions                 Following commands: Impaired Following commands impaired: Follows one step commands with increased time, Follows multi-step commands with increased time, Follows multi-step commands inconsistently     Cueing  General Comments   Cueing Techniques: Verbal cues;Tactile cues;Visual cues  VSS, brop in BP but not orthostatic drop   Exercises     Shoulder Instructions      Home Living Family/patient  expects to be discharged to:: Private residence Living Arrangements: Non-relatives/Friends Available Help at Discharge: Home health;Available 24 hours/day;Personal care attendant Type of Home: House Home Access: Stairs to enter [employer] of Steps: 2 Entrance Stairs-Rails: Left;Right;Can reach both Home Layout: Able to live on main level with bedroom/bathroom;Two level Alternate Level Stairs-Number of Steps: n/a   Bathroom Shower/Tub: Producer, television/film/video: Standard Bathroom Accessibility: Yes How Accessible: Accessible via walker Home Equipment: Pharmacist, hospital (2 wheels);Cane - single point   Additional Comments: 1 fall in feb      Prior Functioning/Environment Prior Level of Function : Independent/Modified Independent;History of Falls (last six months)             Mobility Comments: Has been needing a little help and has been using RW more than cane after recent back surgery. Prior to back surgery, pt was mod I using cane mostly but has RW available when needed. Pt reports ~x6 falls since January ADLs Comments: RN manages meds and does driving and also does cooking and cleaning; pt dresses and bathes himself    OT Problem List: Impaired balance (sitting and/or standing);Decreased cognition;Decreased knowledge of precautions;Decreased knowledge of use of DME or AE;Pain;Decreased activity tolerance   OT Treatment/Interventions: Self-care/ADL training;Therapeutic exercise;Patient/family education;Balance training;Therapeutic activities;DME and/or AE instruction      OT Goals(Current goals can be found in the care plan section)   Acute Rehab OT Goals Patient Stated Goal: to get better OT Goal Formulation: With patient Time For Goal Achievement: 12/18/23 Potential to Achieve Goals: Good ADL Goals Pt Will Perform Lower Body Dressing: with set-up;sit to/from stand Pt Will Transfer to Toilet: with supervision;ambulating Additional ADL  Goal  #1: pt will independently maintain spinal precuations 100% of ADL session   OT Frequency:  Min 2X/week    Co-evaluation              AM-PAC OT 6 Clicks Daily Activity     Outcome Measure Help from another person eating meals?: None Help from another person taking care of personal grooming?: A Little Help from another person toileting, which includes using toliet, bedpan, or urinal?: A Little Help from another person bathing (including washing, rinsing, drying)?: A Lot Help from another person to put on and taking off regular upper body clothing?: A Little Help from another person to put on and taking off regular lower body clothing?: A Lot 6 Click Score: 17   End of Session Equipment Utilized During Treatment: Gait belt;Rolling walker (2 wheels);Back brace Nurse Communication: Mobility status;Precautions  Activity Tolerance: Patient tolerated treatment well Patient left: in bed;with call bell/phone within reach;with bed alarm set  OT Visit Diagnosis: Unsteadiness on feet (R26.81);Other abnormalities of gait and mobility (R26.89);History of falling (Z91.81);Pain                Time: 8744-8687 OT Time Calculation (min): 17 min Charges:  OT General Charges $OT Visit: 1 Visit OT Evaluation $OT Eval Moderate Complexity: 1 Mod  Lucie Kendall, OTR/L Acute Rehabilitation Services Office [phone] Secure Chat Communication Preferred   Lucie JONETTA Kendall 12/04/2023, 2:01 PM

## 2023-12-05 ENCOUNTER — Inpatient Hospital Stay (HOSPITAL_COMMUNITY)
Admission: AD | Admit: 2023-12-05 | Discharge: 2023-12-20 | DRG: 552 | Disposition: A | Discharge status: Home Health | Source: Intra-hospital | Attending: Physical Medicine and Rehabilitation | Admitting: Physical Medicine and Rehabilitation

## 2023-12-05 ENCOUNTER — Other Ambulatory Visit: Payer: Self-pay

## 2023-12-05 ENCOUNTER — Encounter (HOSPITAL_COMMUNITY): Payer: Self-pay | Admitting: Neurosurgery

## 2023-12-05 ENCOUNTER — Encounter (HOSPITAL_COMMUNITY): Payer: Self-pay | Admitting: Physical Medicine and Rehabilitation

## 2023-12-05 DIAGNOSIS — R5383 Other fatigue: Secondary | ICD-10-CM | POA: Diagnosis present

## 2023-12-05 DIAGNOSIS — K592 Neurogenic bowel, not elsewhere classified: Secondary | ICD-10-CM | POA: Diagnosis present

## 2023-12-05 DIAGNOSIS — G8929 Other chronic pain: Secondary | ICD-10-CM | POA: Diagnosis present

## 2023-12-05 DIAGNOSIS — Z8701 Personal history of pneumonia (recurrent): Secondary | ICD-10-CM

## 2023-12-05 DIAGNOSIS — D500 Iron deficiency anemia secondary to blood loss (chronic): Secondary | ICD-10-CM | POA: Diagnosis present

## 2023-12-05 DIAGNOSIS — M25361 Other instability, right knee: Secondary | ICD-10-CM | POA: Diagnosis present

## 2023-12-05 DIAGNOSIS — Z981 Arthrodesis status: Secondary | ICD-10-CM

## 2023-12-05 DIAGNOSIS — Z8551 Personal history of malignant neoplasm of bladder: Secondary | ICD-10-CM

## 2023-12-05 DIAGNOSIS — Z9842 Cataract extraction status, left eye: Secondary | ICD-10-CM

## 2023-12-05 DIAGNOSIS — I10 Essential (primary) hypertension: Secondary | ICD-10-CM | POA: Diagnosis present

## 2023-12-05 DIAGNOSIS — R531 Weakness: Secondary | ICD-10-CM | POA: Diagnosis present

## 2023-12-05 DIAGNOSIS — N400 Enlarged prostate without lower urinary tract symptoms: Secondary | ICD-10-CM | POA: Diagnosis present

## 2023-12-05 DIAGNOSIS — M48061 Spinal stenosis, lumbar region without neurogenic claudication: Secondary | ICD-10-CM | POA: Diagnosis present

## 2023-12-05 DIAGNOSIS — Z801 Family history of malignant neoplasm of trachea, bronchus and lung: Secondary | ICD-10-CM

## 2023-12-05 DIAGNOSIS — D62 Acute posthemorrhagic anemia: Secondary | ICD-10-CM | POA: Diagnosis present

## 2023-12-05 DIAGNOSIS — Z883 Allergy status to other anti-infective agents status: Secondary | ICD-10-CM

## 2023-12-05 DIAGNOSIS — M549 Dorsalgia, unspecified: Secondary | ICD-10-CM | POA: Insufficient documentation

## 2023-12-05 DIAGNOSIS — M5416 Radiculopathy, lumbar region: Secondary | ICD-10-CM | POA: Diagnosis present

## 2023-12-05 DIAGNOSIS — Z9841 Cataract extraction status, right eye: Secondary | ICD-10-CM

## 2023-12-05 DIAGNOSIS — Z8616 Personal history of COVID-19: Secondary | ICD-10-CM

## 2023-12-05 DIAGNOSIS — R569 Unspecified convulsions: Secondary | ICD-10-CM | POA: Diagnosis not present

## 2023-12-05 DIAGNOSIS — G8918 Other acute postprocedural pain: Secondary | ICD-10-CM

## 2023-12-05 DIAGNOSIS — N1831 Chronic kidney disease, stage 3a: Secondary | ICD-10-CM | POA: Diagnosis present

## 2023-12-05 DIAGNOSIS — Z881 Allergy status to other antibiotic agents status: Secondary | ICD-10-CM

## 2023-12-05 DIAGNOSIS — F418 Other specified anxiety disorders: Secondary | ICD-10-CM | POA: Diagnosis not present

## 2023-12-05 DIAGNOSIS — I2510 Atherosclerotic heart disease of native coronary artery without angina pectoris: Secondary | ICD-10-CM | POA: Diagnosis present

## 2023-12-05 DIAGNOSIS — Z4789 Encounter for other orthopedic aftercare: Secondary | ICD-10-CM | POA: Diagnosis not present

## 2023-12-05 DIAGNOSIS — Z87442 Personal history of urinary calculi: Secondary | ICD-10-CM

## 2023-12-05 DIAGNOSIS — E039 Hypothyroidism, unspecified: Secondary | ICD-10-CM | POA: Diagnosis present

## 2023-12-05 DIAGNOSIS — Z79890 Hormone replacement therapy (postmenopausal): Secondary | ICD-10-CM

## 2023-12-05 DIAGNOSIS — Z9181 History of falling: Secondary | ICD-10-CM

## 2023-12-05 DIAGNOSIS — G992 Myelopathy in diseases classified elsewhere: Secondary | ICD-10-CM | POA: Diagnosis present

## 2023-12-05 DIAGNOSIS — R296 Repeated falls: Secondary | ICD-10-CM | POA: Diagnosis present

## 2023-12-05 DIAGNOSIS — K5900 Constipation, unspecified: Secondary | ICD-10-CM | POA: Diagnosis present

## 2023-12-05 DIAGNOSIS — Z79899 Other long term (current) drug therapy: Secondary | ICD-10-CM

## 2023-12-05 DIAGNOSIS — R260 Ataxic gait: Secondary | ICD-10-CM | POA: Diagnosis present

## 2023-12-05 DIAGNOSIS — Z8639 Personal history of other endocrine, nutritional and metabolic disease: Secondary | ICD-10-CM

## 2023-12-05 DIAGNOSIS — R42 Dizziness and giddiness: Secondary | ICD-10-CM | POA: Diagnosis present

## 2023-12-05 DIAGNOSIS — G629 Polyneuropathy, unspecified: Secondary | ICD-10-CM | POA: Diagnosis present

## 2023-12-05 DIAGNOSIS — Z85828 Personal history of other malignant neoplasm of skin: Secondary | ICD-10-CM | POA: Diagnosis not present

## 2023-12-05 DIAGNOSIS — D631 Anemia in chronic kidney disease: Secondary | ICD-10-CM | POA: Diagnosis present

## 2023-12-05 DIAGNOSIS — R292 Abnormal reflex: Secondary | ICD-10-CM | POA: Diagnosis present

## 2023-12-05 DIAGNOSIS — I129 Hypertensive chronic kidney disease with stage 1 through stage 4 chronic kidney disease, or unspecified chronic kidney disease: Secondary | ICD-10-CM | POA: Diagnosis present

## 2023-12-05 DIAGNOSIS — R4189 Other symptoms and signs involving cognitive functions and awareness: Secondary | ICD-10-CM | POA: Diagnosis present

## 2023-12-05 DIAGNOSIS — M4807 Spinal stenosis, lumbosacral region: Secondary | ICD-10-CM | POA: Diagnosis present

## 2023-12-05 DIAGNOSIS — R2681 Unsteadiness on feet: Secondary | ICD-10-CM | POA: Diagnosis present

## 2023-12-05 DIAGNOSIS — Z961 Presence of intraocular lens: Secondary | ICD-10-CM | POA: Diagnosis present

## 2023-12-05 DIAGNOSIS — F419 Anxiety disorder, unspecified: Secondary | ICD-10-CM | POA: Diagnosis present

## 2023-12-05 DIAGNOSIS — M48062 Spinal stenosis, lumbar region with neurogenic claudication: Secondary | ICD-10-CM | POA: Diagnosis not present

## 2023-12-05 DIAGNOSIS — K219 Gastro-esophageal reflux disease without esophagitis: Secondary | ICD-10-CM | POA: Diagnosis present

## 2023-12-05 DIAGNOSIS — G4700 Insomnia, unspecified: Secondary | ICD-10-CM | POA: Diagnosis present

## 2023-12-05 DIAGNOSIS — Z888 Allergy status to other drugs, medicaments and biological substances status: Secondary | ICD-10-CM

## 2023-12-05 DIAGNOSIS — M109 Gout, unspecified: Secondary | ICD-10-CM | POA: Diagnosis present

## 2023-12-05 DIAGNOSIS — Z602 Problems related to living alone: Secondary | ICD-10-CM | POA: Diagnosis present

## 2023-12-05 DIAGNOSIS — N179 Acute kidney failure, unspecified: Secondary | ICD-10-CM | POA: Diagnosis present

## 2023-12-05 DIAGNOSIS — L231 Allergic contact dermatitis due to adhesives: Secondary | ICD-10-CM | POA: Diagnosis not present

## 2023-12-05 DIAGNOSIS — N1832 Chronic kidney disease, stage 3b: Secondary | ICD-10-CM | POA: Diagnosis not present

## 2023-12-05 DIAGNOSIS — Z882 Allergy status to sulfonamides status: Secondary | ICD-10-CM

## 2023-12-05 DIAGNOSIS — Z807 Family history of other malignant neoplasms of lymphoid, hematopoietic and related tissues: Secondary | ICD-10-CM

## 2023-12-05 MED ORDER — BISACODYL 5 MG PO TBEC
5.0000 mg | DELAYED_RELEASE_TABLET | Freq: Every day | ORAL | Status: DC | PRN
  Administered 2023-12-05 (×2): 5 mg via ORAL
  Filled 2023-12-05: qty 1

## 2023-12-05 MED ORDER — MELATONIN 5 MG PO TABS
5.0000 mg | ORAL_TABLET | Freq: Every evening | ORAL | Status: DC | PRN
  Administered 2023-12-14 – 2023-12-19 (×5): 5 mg via ORAL
  Filled 2023-12-05 (×5): qty 1

## 2023-12-05 MED ORDER — PROCHLORPERAZINE EDISYLATE 10 MG/2ML IJ SOLN: 5.0000 mg | Freq: Four times a day (QID) | INTRAMUSCULAR | Status: DC | PRN

## 2023-12-05 MED ORDER — DOCUSATE SODIUM 100 MG PO CAPS
100.0000 mg | ORAL_CAPSULE | Freq: Two times a day (BID) | ORAL | Status: DC
  Administered 2023-12-05 – 2023-12-06 (×6): 100 mg via ORAL
  Filled 2023-12-05 (×3): qty 1

## 2023-12-05 MED ORDER — PHENOL 1.4 % MT LIQD: 1.0000 | OROMUCOSAL | Status: DC | PRN

## 2023-12-05 MED ORDER — LEVOTHYROXINE SODIUM 100 MCG PO TABS
100.0000 ug | ORAL_TABLET | ORAL | Status: DC
  Administered 2023-12-10 – 2023-12-17 (×2): 100 ug via ORAL
  Filled 2023-12-05 (×2): qty 1

## 2023-12-05 MED ORDER — ALUM & MAG HYDROXIDE-SIMETH 200-200-20 MG/5ML PO SUSP: 30.0000 mL | ORAL | Status: DC | PRN

## 2023-12-05 MED ORDER — DULOXETINE HCL 60 MG PO CPEP
60.0000 mg | ORAL_CAPSULE | Freq: Every morning | ORAL | Status: DC
  Administered 2023-12-06 – 2023-12-15 (×11): 60 mg via ORAL
  Filled 2023-12-05 (×10): qty 1

## 2023-12-05 MED ORDER — CYCLOBENZAPRINE HCL 5 MG PO TABS: 5.0000 mg | ORAL_TABLET | Freq: Three times a day (TID) | ORAL | Status: DC | PRN

## 2023-12-05 MED ORDER — PROCHLORPERAZINE MALEATE 5 MG PO TABS: 5.0000 mg | ORAL_TABLET | Freq: Four times a day (QID) | ORAL | Status: DC | PRN

## 2023-12-05 MED ORDER — DIPHENHYDRAMINE HCL 25 MG PO CAPS
25.0000 mg | ORAL_CAPSULE | Freq: Four times a day (QID) | ORAL | Status: DC | PRN
  Administered 2023-12-09 – 2023-12-20 (×5): 25 mg via ORAL
  Filled 2023-12-05 (×5): qty 1

## 2023-12-05 MED ORDER — POLYVINYL ALCOHOL 1.4 % OP SOLN: 1.0000 [drp] | Freq: Four times a day (QID) | OPHTHALMIC | Status: DC | PRN

## 2023-12-05 MED ORDER — COLCHICINE 0.6 MG PO TABS
0.6000 mg | ORAL_TABLET | Freq: Every morning | ORAL | Status: DC
  Administered 2023-12-06 – 2023-12-20 (×16): 0.6 mg via ORAL
  Filled 2023-12-05 (×16): qty 1

## 2023-12-05 MED ORDER — SORBITOL 70 % SOLN
60.0000 mL | Freq: Every day | Status: DC | PRN
  Administered 2023-12-06 – 2023-12-12 (×3): 60 mL via ORAL
  Filled 2023-12-05 (×4): qty 60

## 2023-12-05 MED ORDER — VITAMIN B-12 1000 MCG PO TABS
500.0000 ug | ORAL_TABLET | Freq: Every day | ORAL | Status: DC
  Administered 2023-12-06 – 2023-12-08 (×4): 500 ug via ORAL
  Filled 2023-12-05 (×3): qty 1

## 2023-12-05 MED ORDER — OXYCODONE HCL 5 MG PO TABS
5.0000 mg | ORAL_TABLET | ORAL | Status: DC | PRN
  Administered 2023-12-05 – 2023-12-11 (×24): 5 mg via ORAL
  Filled 2023-12-05 (×21): qty 1

## 2023-12-05 MED ORDER — ACETAMINOPHEN 325 MG PO TABS: 325.0000 mg | ORAL_TABLET | ORAL | Status: DC | PRN

## 2023-12-05 MED ORDER — DUTASTERIDE 0.5 MG PO CAPS
0.5000 mg | ORAL_CAPSULE | Freq: Every evening | ORAL | Status: DC
  Administered 2023-12-06 – 2023-12-19 (×15): 0.5 mg via ORAL
  Filled 2023-12-05 (×15): qty 1

## 2023-12-05 MED ORDER — MENTHOL 3 MG MT LOZG: 1.0000 | LOZENGE | OROMUCOSAL | Status: DC | PRN

## 2023-12-05 MED ORDER — TAMSULOSIN HCL 0.4 MG PO CAPS
0.4000 mg | ORAL_CAPSULE | Freq: Every day | ORAL | Status: DC
  Administered 2023-12-06 – 2023-12-19 (×15): 0.4 mg via ORAL
  Filled 2023-12-05 (×14): qty 1

## 2023-12-05 MED ORDER — FERROUS SULFATE 325 (65 FE) MG PO TABS
325.0000 mg | ORAL_TABLET | Freq: Three times a day (TID) | ORAL | Status: DC
  Administered 2023-12-06 – 2023-12-20 (×46): 325 mg via ORAL
  Filled 2023-12-05 (×43): qty 1

## 2023-12-05 MED ORDER — PROCHLORPERAZINE 25 MG RE SUPP: 12.5000 mg | Freq: Four times a day (QID) | RECTAL | Status: DC | PRN

## 2023-12-05 MED ORDER — ATORVASTATIN CALCIUM 40 MG PO TABS
40.0000 mg | ORAL_TABLET | Freq: Every morning | ORAL | Status: DC
  Administered 2023-12-06 – 2023-12-20 (×16): 40 mg via ORAL
  Filled 2023-12-05 (×15): qty 1

## 2023-12-05 MED ORDER — IRBESARTAN 75 MG PO TABS
75.0000 mg | ORAL_TABLET | Freq: Every day | ORAL | Status: DC
  Administered 2023-12-06 – 2023-12-20 (×16): 75 mg via ORAL
  Filled 2023-12-05 (×15): qty 1

## 2023-12-05 MED ORDER — DOCUSATE SODIUM 100 MG PO CAPS
100.0000 mg | ORAL_CAPSULE | Freq: Two times a day (BID) | ORAL | Status: DC
  Administered 2023-12-05 (×2): 100 mg via ORAL
  Filled 2023-12-05: qty 1

## 2023-12-05 MED ORDER — PANTOPRAZOLE SODIUM 20 MG PO TBEC
20.0000 mg | DELAYED_RELEASE_TABLET | Freq: Every day | ORAL | Status: DC
  Administered 2023-12-06 – 2023-12-20 (×16): 20 mg via ORAL
  Filled 2023-12-05 (×15): qty 1

## 2023-12-05 MED ORDER — LEVOTHYROXINE SODIUM 50 MCG PO TABS
50.0000 ug | ORAL_TABLET | ORAL | Status: DC
  Administered 2023-12-06 – 2023-12-20 (×14): 50 ug via ORAL
  Filled 2023-12-05 (×13): qty 1

## 2023-12-05 MED ORDER — TIZANIDINE HCL 2 MG PO TABS: 2.0000 mg | ORAL_TABLET | Freq: Four times a day (QID) | ORAL | 0 refills | Status: DC | PRN

## 2023-12-05 MED ORDER — CLONAZEPAM 0.5 MG PO TABS: 0.5000 mg | ORAL_TABLET | Freq: Every evening | ORAL | Status: DC | PRN

## 2023-12-05 MED ORDER — FLEET ENEMA RE ENEM
1.0000 | ENEMA | Freq: Once | RECTAL | Status: AC | PRN
  Administered 2023-12-17: 1 via RECTAL
  Filled 2023-12-05: qty 1

## 2023-12-05 MED ORDER — GUAIFENESIN-DM 100-10 MG/5ML PO SYRP: 5.0000 mL | ORAL_SOLUTION | Freq: Four times a day (QID) | ORAL | Status: DC | PRN

## 2023-12-05 MED ORDER — THIAMINE MONONITRATE 100 MG PO TABS
100.0000 mg | ORAL_TABLET | Freq: Every day | ORAL | Status: DC
  Administered 2023-12-06 – 2023-12-20 (×16): 100 mg via ORAL
  Filled 2023-12-05 (×15): qty 1

## 2023-12-05 MED ORDER — EZETIMIBE 10 MG PO TABS
10.0000 mg | ORAL_TABLET | Freq: Every morning | ORAL | Status: DC
  Administered 2023-12-06 – 2023-12-20 (×16): 10 mg via ORAL
  Filled 2023-12-05 (×15): qty 1

## 2023-12-05 MED ORDER — HYDROCODONE-ACETAMINOPHEN 5-325 MG PO TABS: 2.0000 | ORAL_TABLET | ORAL | 0 refills | Status: DC | PRN

## 2023-12-05 NOTE — Care Management Important Message (Signed)
 Important Message  Patient Details  Name: Miguel Williams MRN: [id number] Date of Birth: [birth year]   Important Message Given:  Yes - Medicare IM     Jon Cruel 12/05/2023, 11:59 AM

## 2023-12-05 NOTE — Discharge Summary (Signed)
 Physician Discharge Summary  Patient ID: Miguel Williams MRN: [id number] DOB/AGE: 79/10/1945 79 y.o.  Admit date: 11/30/2023 Discharge date: 12/05/2023  Admission Diagnoses: Lumbar wound epidural hematoma.     Discharge Diagnoses: same   Discharged Condition: good  Hospital Course: The patient was admitted on 11/30/2023 and taken to the operating room where the patient underwent lumbar wound I&D. The patient tolerated the procedure well and was taken to the recovery room and then to the floor in stable condition. The hospital course was routine. There were no complications. The wound remained clean dry and intact. Pt had appropriate back soreness. No complaints of leg pain or new N/T/W. The patient remained afebrile with stable vital signs, and tolerated a regular diet. The patient continued to increase activities, and pain was well controlled with oral pain medications.   Consults: None  Significant Diagnostic Studies:  Results for orders placed or performed during the hospital encounter of 11/30/23  CBC   Collection Time: 11/30/23  7:23 PM  Result Value Ref Range   WBC 8.7 4.0 - 10.5 K/uL   RBC 3.66 (L) 4.22 - 5.81 MIL/uL   Hemoglobin 11.5 (L) 13.0 - 17.0 g/dL   HCT 65.0 (L) 60.9 - 47.9 %   MCV 95.4 80.0 - 100.0 fL   MCH 31.4 26.0 - 34.0 pg   MCHC 33.0 30.0 - 36.0 g/dL   RDW 86.1 88.4 - 84.4 %   Platelets 278 150 - 400 K/uL   nRBC 0.0 0.0 - 0.2 %  CK   Collection Time: 11/30/23  7:23 PM  Result Value Ref Range   Total CK 35 (L) 49 - 397 U/L  Comprehensive metabolic panel   Collection Time: 11/30/23  7:32 PM  Result Value Ref Range   Sodium 135 135 - 145 mmol/L   Potassium 4.5 3.5 - 5.1 mmol/L   Chloride 109 98 - 111 mmol/L   CO2 20 (L) 22 - 32 mmol/L   Glucose, Bld 116 (H) 70 - 99 mg/dL   BUN 22 8 - 23 mg/dL   Creatinine, Ser 8.61 (H) 0.61 - 1.24 mg/dL   Calcium  8.5 (L) 8.9 - 10.3 mg/dL   Total Protein 6.0 (L) 6.5 - 8.1 g/dL   Albumin  3.4 (L) 3.5 - 5.0 g/dL   AST 15 15 -  41 U/L   ALT 19 0 - 44 U/L   Alkaline Phosphatase 94 38 - 126 U/L   Total Bilirubin 0.6 0.0 - 1.2 mg/dL   GFR, Estimated 52 (L) >60 mL/min   Anion gap 6 5 - 15  Aerobic/Anaerobic Culture w Gram Stain (surgical/deep wound)   Collection Time: 12/01/23 10:53 AM   Specimen: Wound; Tissue  Result Value Ref Range   Specimen Description WOUND    Special Requests NONE    Gram Stain NO WBC SEEN NO ORGANISMS SEEN     Culture      NO GROWTH 4 DAYS NO ANAEROBES ISOLATED; CULTURE IN PROGRESS FOR 5 DAYS Performed at [facility], [address], [place], [place] [address]    Report Status PENDING     MR Lumbar Spine W Wo Contrast Result Date: 12/01/2023 CLINICAL DATA:  Lumbar radiculopathy, prior surgery, new symptoms EXAM: MRI LUMBAR SPINE WITHOUT AND WITH CONTRAST TECHNIQUE: Multiplanar and multiecho pulse sequences of the lumbar spine were obtained without and with intravenous contrast. CONTRAST:  9mL GADAVIST  GADOBUTROL  1 MMOL/ML IV SOLN COMPARISON:  MRI lumbar spine May 22, 25. FINDINGS: Segmentation:  Standard. Alignment: Similar  trace retrolisthesis of L1 on L2, L2 on L3 and L4 on L5. Vertebrae: Postoperative changes of L4-S1 PLIF, new since the prior. No specific evidence of acute fracture or discitis/osteomyelitis. Artifact from the fusion hardware does limit assessment. Conus medullaris and cauda equina: Conus extends to the L2 level. Conus appears normal. Paraspinal and other soft tissues: Postoperative changes in the lower lumbar spine. This includes a large (6.8 x 6.0 x 4.0 cm) posterior paraspinal fluid collection which spans from L4-S1 and extends anteriorly into the dorsal canal resulting in severe canal stenosis is greatest at L4-L5. Disc levels: T12-L1: Mild disc bulging.  No significant stenosis. L1-L2: Disc bulging without significant stenosis. L2-L3: Disc bulging and moderate facet and ligamentum flavum hypertrophy with mild canal stenosis. No significant foraminal stenosis. L3-L4:  Disc bulging and moderate to severe facet and ligamentum flavum hypertrophy without significant stenosis. L4-L5: Interval posterior decompression and PLIF. The large fluid collection described above extends into the canal with anterior displacement of the cauda equina nerve roots and severe canal stenosis. There is limited assessment of the foramina but the foramina appear mildly narrowed. L5-S1: Posterior disc bulging and endplate spurring. Interval posterior decompression and posterior fusion. The posterior fluid collection scribed above extends into the canal with mild to moderate canal stenosis. Moderate bilateral foraminal stenosis. IMPRESSION: 1. Large 6.8 cm posterior paraspinal fluid collection, likely postoperative seroma given recent L4-S1 PLIF but sterility indeterminate by MRI. 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 2. At L5-S1, moderate bilateral foraminal stenosis. Electronically Signed   By: Gilmore GORMAN Molt M.D.   On: 12/01/2023 00:13   DG Lumbar Spine 2-3 Views Result Date: 11/15/2023 CLINICAL DATA:  Posterolateral and interbody fusion L4-5 and L5-S1. EXAM: LUMBAR SPINE - 2-3 VIEW COMPARISON:  Lumbar spine 11/09/2023 and MR lumbar spine 09/14/2023. FINDINGS: Two intraoperative fluoroscopic spot views of the lumbar spine are provided in the AP and lateral projections. Number system utilized on 09/14/2023 is preserved. Pedicle screws at L4, L5 and S1 with interbody spacers. IMPRESSION: Intraoperative visualization for L4-S1 posterior interbody fusion. Electronically Signed   By: Newell Eke M.D.   On: 11/15/2023 13:21   DG C-Arm 1-60 Min-No Report Result Date: 11/15/2023 Fluoroscopy was utilized by the requesting physician.  No radiographic interpretation.   DG C-Arm 1-60 Min-No Report Result Date: 11/15/2023 Fluoroscopy was utilized by the requesting  physician.  No radiographic interpretation.   DG C-Arm 1-60 Min-No Report Result Date: 11/15/2023 Fluoroscopy was utilized by the requesting physician.  No radiographic interpretation.    Antibiotics:  Anti-infectives (From admission, onward)    Start     Dose/Rate Route Frequency Ordered Stop   12/01/23 1415  ceFAZolin  (ANCEF ) IVPB 2g/100 mL premix  Status:  Discontinued        2 g 200 mL/hr over 30 Minutes Intravenous Every 8 hours 12/01/23 1317 12/04/23 1234       Discharge Exam: Blood pressure 137/75, pulse 88, temperature 98.2 F (36.8 C), temperature source Oral, resp. rate 17, height 5' 8 (1.727 m), weight 88.5 kg, SpO2 99%. Neurologic: Grossly normal Ambulating and voiding well incision cdi   Discharge Medications:   Allergies as of 12/05/2023       Reactions   Baclofen Other (See Comments)   Hallucinations- required admit to hospital for adverse drug rxn.   Bacitracin-polymyxin B Hives   Tegretol  [carbamazepine ] Hives   Allopurinol Other (See Comments)  Had a bad reaction   Neomycin Other (See Comments)   Allergic skin reaction   Sulfa Antibiotics Hives, Rash        Medication List     STOP taking these medications    traMADol  50 MG tablet Commonly known as: ULTRAM        TAKE these medications    acetaminophen  500 MG tablet Commonly known as: TYLENOL  Take 1 tablet (500 mg total) by mouth every 6 (six) hours as needed for mild pain, moderate pain or fever.   atorvastatin  40 MG tablet Commonly known as: LIPITOR Take 40 mg by mouth in the morning.   betamethasone dipropionate 0.05 % cream Apply 1 Application topically 2 (two) times daily as needed (skin irritation.).   calcium -vitamin D 500-5 MG-MCG tablet Commonly known as: OSCAL WITH D Take 1 tablet by mouth.   clonazePAM  0.5 MG tablet Commonly known as: KLONOPIN  Take 0.5 mg by mouth at bedtime as needed (sleep/anxiety).   colchicine  0.6 MG tablet Take 0.6 mg by mouth in the  morning.   cyanocobalamin  500 MCG tablet Commonly known as: VITAMIN B12 Take 500 mcg by mouth daily.   DULoxetine  60 MG capsule Commonly known as: CYMBALTA  Take 60 mg by mouth in the morning.   dutasteride  0.5 MG capsule Commonly known as: Avodart  Take 1 capsule (0.5 mg total) by mouth every other day. What changed: when to take this   ezetimibe  10 MG tablet Commonly known as: ZETIA  Take 10 mg by mouth in the morning.   ferrous sulfate  325 (65 FE) MG EC tablet Take 325 mg by mouth 3 (three) times daily with meals.   FIBER CHOICE PO Take 2 capsules by mouth daily.   HYDROcodone -acetaminophen  5-325 MG tablet Commonly known as: NORCO/VICODIN Take 2 tablets by mouth every 4 (four) hours as needed for severe pain (pain score 7-10).   lansoprazole 30 MG capsule Commonly known as: PREVACID Take 30 mg by mouth in the morning and at bedtime.   levothyroxine  50 MCG tablet Commonly known as: SYNTHROID  Take 50-100 mcg by mouth See admin instructions. Take 2 tablets (100 mcg) by mouth in the morning on Sundays.  Take 1 tablet (50 mcg) by mouth in the morning on Mondays, Tuesdays, Wednesdays, Thursdays, Fridays & Saturdays.   loperamide  2 MG tablet Commonly known as: IMODIUM  A-D Take 2 mg by mouth 4 (four) times daily as needed for diarrhea or loose stools.   Systane Complete PF 0.6 % Soln Generic drug: Propylene Glycol (PF) Place 1 drop into both eyes 4 (four) times daily as needed (for dryness).   tamsulosin  0.4 MG Caps capsule Commonly known as: FLOMAX  TAKE 1 CAPSULE (0.4 MG TOTAL) BY MOUTH DAILY.   thiamine  100 MG tablet Commonly known as: Vitamin B-1 Take 1 tablet (100 mg total) by mouth daily.   tiZANidine  2 MG tablet Commonly known as: ZANAFLEX  Take 1 tablet (2 mg total) by mouth every 6 (six) hours as needed for muscle spasms.   valsartan 80 MG tablet Commonly known as: DIOVAN Take 80 mg by mouth at bedtime.   VITAMIN D (CHOLECALCIFEROL ) PO Take 1 capsule by  mouth daily.        Disposition: rehab   Final Dx: lumbar wound I&D  Discharge Instructions     Call MD for:  difficulty breathing, headache or visual disturbances   Complete by: As directed    Call MD for:  hives   Complete by: As directed    Call MD for:  persistant nausea and vomiting   Complete by: As directed    Call MD for:  redness, tenderness, or signs of infection (pain, swelling, redness, odor or green/yellow discharge around incision site)   Complete by: As directed    Call MD for:  severe uncontrolled pain   Complete by: As directed    Diet - low sodium heart healthy   Complete by: As directed    Incentive spirometry RT   Complete by: As directed    Increase activity slowly   Complete by: As directed    No wound care   Complete by: As directed         Follow-up Information     Schedule an appointment as soon as possible for a visit  with Connect with your PCP/Specialist as discussed.   Contact information: [email or web address] Call our physician referral line at [phone].                 Signed: Suzen Lacks Yashica Sterbenz 12/05/2023, 6:00 PM

## 2023-12-05 NOTE — Progress Notes (Signed)
 Miguel Arthea DASEN, MD  Physician Physical Medicine and Rehabilitation   Consult Note    Signed   Date of Service: 12/04/2023 10:24 AM  Related encounter: ED to Hosp-Admission (Current) from 11/30/2023 in [facility]   Signed     Expand All Collapse All  Show:Clear all [x] Written[x] Templated[] Copied  Added by: [x] Miguel Arthea DASEN, MD  [] Hover for details          Physical Medicine and Rehabilitation Consult Reason for Consult:bilateral LE weakness Referring Physician: [name]     HPI: Miguel Williams is a 78 y.o. male with history of bipolar disorder, chronic kidney disease stage III, bladder cancer, who developed progressive worsening back and bilateral hip pain with radiation to both legs consistent with neurogenic claudication.  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  Patient ultimately required and on 11/15/2023 patient underwent decompressive laminectomies at L4-L5 as well as L5-S1 with facetectomies and foraminotomies in addition to PLIF and screw fixation by Dr. Arley cram.  Patient was discharged home but then presented again on 12/01/2023 with progressive pain in both his back and legs.  Imaging revealed a large epidural hematoma at the surgical site.  Patient underwent reexploration of the surgical wound with evacuation of seromatous fluid the same day.  Patient with continued postoperative pain although somewhat improved.  The patient mobilized with therapies yesterday and was min assist for sit to stand transfers and was able to walk 8 feet with min assist using a rolling walker.  Patient lives at home but is had multiple falls over the last several months.  He has a nurse that visits the home who manages meds and does household chores as well as driving.  The patient still had been ambulating with a rolling walker and dressed and bathed himself independently prior to this admission.     Home: Home  Living Family/patient expects to be discharged to:: Private residence Living Arrangements: Non-relatives/Friends [name], Home care nurse) Available Help at Discharge: Home health, Available 24 hours/day [facility] RN) Type of Home: House Home Access: Stairs to enter [employer] of Steps: 2 Entrance Stairs-Rails: Left, Right, Can reach both Home Layout: Able to live on main level with bedroom/bathroom, Two level Bathroom Shower/Tub: Health visitor: Standard Bathroom Accessibility: Yes Home Equipment: Information systems manager, Agricultural consultant (2 wheels), [employer] - single point  Lives With: Other (Comment) (home health nurse)  Functional History: Prior Function Prior Level of Function : Independent/Modified Independent, History of Falls (last six months) Mobility Comments: Has been needing a little help and has been using RW more than cane after recent back surgery. Prior to back surgery, pt was mod I using cane mostly but has RW available when needed. Pt reports ~x6 falls since January ADLs Comments: RN manages meds and does driving and also does cooking and cleaning; pt dresses and bathes himself Functional Status:  Mobility: Bed Mobility Overal bed mobility: Needs Assistance Bed Mobility: Rolling, Sidelying to Sit, Sit to Sidelying Rolling: Supervision Sidelying to sit: Supervision, HOB elevated Sit to sidelying: Supervision, HOB elevated General bed mobility comments: Pt prompted by PT for complying to spinal precautions with bed mobility. Pt able to perform all bed mobility aspects with supervision for safety Transfers Overall transfer level: Needs assistance Equipment used: Rolling walker (2 wheels) Transfers: Sit to/from Stand Sit to Stand: Min assist, Contact guard assist General transfer comment: The first rep to stand from EOB, pt was leaning posteriorly and needed  minA for balance. Upon the second rep from EOB, pt improved his anterior weight shift and progressed to CGA  for safety. Pt performed x2 more reps from a chair with CGA-minA for balance. Ambulation/Gait Ambulation/Gait assistance: Min assist Gait Distance (Feet): 8 Feet (x3 bouts of ~6-8 ft per bout) Assistive device: Rolling walker (2 wheels) Gait Pattern/deviations: Step-through pattern, Decreased step length - right, Decreased step length - left, Decreased stride length, Knee flexed in stance - right, Knee flexed in stance - left, Trunk flexed General Gait Details: Pt takes slow, small, unsteady steps. He tends to flex his knees during stance phase and seems unsteady like his knees are close to buckling. Repeated multi-modal cues provided to extend his knees during stance phase, but poor carryover noted. MinA provided continuously for balance and safety with close chair follow. Gait velocity: reduced Gait velocity interpretation: <1.31 ft/sec, indicative of household ambulator   ADL:   Cognition: Cognition Orientation Level: Oriented X4 Cognition Arousal: Alert Behavior During Therapy: WFL for tasks assessed/performed     Review of Systems  Constitutional: Negative.   HENT: Negative.    Eyes: Negative.   Respiratory: Negative.    Cardiovascular: Negative.   Gastrointestinal:  Positive for constipation. Negative for nausea.  Genitourinary:  Positive for urgency.  Musculoskeletal:  Positive for back pain, falls and myalgias.  Skin: Negative.   Neurological:  Positive for focal weakness and weakness.  Psychiatric/Behavioral: Negative.         Past Medical History:  Diagnosis Date   Abdominal pain, lower     Agitation     Allergy, unspecified, initial encounter     Anxiety disorder     Arthritis     Asthma      As a child   Bipolar disorder [facility])      Patient denies   Cancer [facility])      Bladder   Cancer (HCC)      Skin cancer   Chronic kidney disease, stage III (moderate) (HCC)     Chronic kidney disease, stage III (moderate) (HCC)     COPD (chronic obstructive pulmonary  disease) (HCC)      Pt denies (10/2023)   Coronary atherosclerosis of native coronary artery     Esophageal reflux     Fatigue     Fungal granuloma     Hip pain     History of 2019 novel coronavirus disease (COVID-19)     History of endoscopy 02/00/2011   History of kidney stones     Hypertension     Hypo-osmolality and hyponatremia     Hypokalemia     Hypothyroidism     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 now has 3oz of wine every other day (as of 10/2023)   Umbilical hernia     Unspecified fall, subsequent encounter  Vitamin D deficiency, unspecified     Wernicke's encephalopathy 06/23/2019             Past Surgical History:  Procedure Laterality Date   CATARACT EXTRACTION W/ INTRAOCULAR LENS IMPLANT Bilateral     COLONOSCOPY   04/2023   COLONOSCOPY W/ ENDOSCOPIC US        CYSTOSCOPY WITH FULGERATION N/A 03/09/2023    Procedure: CYSTOSCOPY WITH FULGERATION WITH CLOT EVACUATION;  Surgeon: Devere Lonni Righter, MD;  [facility];  Service: Urology;  Laterality: N/A;   CYSTOSCOPY WITH URETHRAL DILATATION N/A 03/08/2023    Procedure: CYSTOSCOPY;  Surgeon: Devere Lonni Righter, MD;  Location: [facility];  Service: Urology;  Laterality: N/A;  30 MINUTES   ESOPHAGOGASTRODUODENOSCOPY       TONSILLECTOMY   04/25/1951             Family History  Problem Relation Age of Onset   Multiple myeloma Mother     Lung cancer Father          Social History:   reports that he has never smoked. He has never used smokeless tobacco. He reports current alcohol  use. He reports that he does not currently use drugs. Allergies:  Allergies       Allergies  Allergen Reactions   Baclofen Other (See Comments)      Hallucinations- required admit to hospital for adverse drug rxn.   Bacitracin-Polymyxin B Hives   Tegretol  [Carbamazepine ] Hives   Allopurinol Other (See Comments)      Had a bad reaction   Neomycin Other (See Comments)      Allergic skin reaction   Sulfa Antibiotics Hives and Rash            Medications Prior to Admission  Medication Sig Dispense Refill   acetaminophen  (TYLENOL ) 500 MG tablet Take 1 tablet (500 mg total) by mouth every 6 (six) hours as needed for mild pain, moderate pain or fever. 30 tablet 0   atorvastatin  (LIPITOR) 40 MG tablet Take 40 mg by mouth in the morning.       clonazePAM  (KLONOPIN ) 0.5 MG tablet Take 0.5 mg by mouth at bedtime as needed (sleep/anxiety).       colchicine  0.6 MG tablet Take 0.6 mg by mouth in the morning.       cyanocobalamin  (VITAMIN B12) 500 MCG tablet Take 500 mcg by mouth daily.       DULoxetine  (CYMBALTA ) 60 MG capsule Take 60 mg by mouth in the morning.       dutasteride  (AVODART ) 0.5 MG capsule Take 1 capsule (0.5 mg total) by mouth every other day. (Patient taking differently: Take 0.5 mg by mouth every evening.) 30 capsule 0   ezetimibe  (ZETIA ) 10 MG tablet Take 10 mg by mouth in the morning.       ferrous sulfate  325 (65 FE) MG EC tablet Take 325 mg by mouth 3 (three) times daily with meals.       HYDROcodone -acetaminophen  (NORCO/VICODIN) 5-325 MG tablet Take 2 tablets by mouth every 4 (four) hours as needed for severe pain (pain score 7-10). 30 tablet 0   Inulin  (FIBER CHOICE PO) Take 2 capsules by mouth daily.       lansoprazole (PREVACID) 30 MG capsule Take 30 mg by mouth in the morning and at bedtime.       levothyroxine  (SYNTHROID ) 50 MCG tablet Take 50-100 mcg by mouth See admin  instructions. Take 2 tablets (100 mcg) by mouth in the morning on  Sundays.  Take 1 tablet (50 mcg) by mouth in the morning on Mondays, Tuesdays, Wednesdays, Thursdays, Fridays & Saturdays.       SYSTANE COMPLETE PF 0.6 % SOLN Place 1 drop into both eyes 4 (four) times daily as needed (for dryness).       Tamsulosin  HCl (FLOMAX ) 0.4 MG CAPS TAKE 1 CAPSULE (0.4 MG TOTAL) BY MOUTH DAILY. 30 capsule 6   tiZANidine  (ZANAFLEX ) 2 MG tablet Take 2 mg by mouth every 6 (six) hours as needed for muscle spasms.       traMADol  (ULTRAM ) 50 MG tablet Take 50 mg by mouth every 6 (six) hours as needed (pain.).       valsartan (DIOVAN) 80 MG tablet Take 80 mg by mouth at bedtime.       VITAMIN D, CHOLECALCIFEROL , PO Take 1 capsule by mouth daily.       betamethasone dipropionate 0.05 % cream Apply 1 Application topically 2 (two) times daily as needed (skin irritation.).       calcium -vitamin D (OSCAL WITH D) 500-5 MG-MCG tablet Take 1 tablet by mouth.       loperamide  (IMODIUM  A-D) 2 MG tablet Take 2 mg by mouth 4 (four) times daily as needed for diarrhea or loose stools.       probenecid  (BENEMID ) 500 MG tablet Take 1 tablet (500 mg total) by mouth daily. (Patient not taking: Reported on 12/01/2023) 30 tablet 1   thiamine  (VITAMIN B-1) 100 MG tablet Take 1 tablet (100 mg total) by mouth daily.                Blood pressure (!) 141/77, pulse 86, temperature 98 F (36.7 C), temperature source Oral, resp. rate 20, height 5' 8 (1.727 m), weight 88.5 kg, SpO2 95%. Physical Exam Constitutional:      General: He is not in acute distress.    Appearance: He is obese. He is not ill-appearing.  HENT:     Head: Normocephalic.     Right Ear: External ear normal.     Left Ear: External ear normal.     Nose: Nose normal.     Mouth/Throat:     Pharynx: No oropharyngeal exudate or posterior oropharyngeal erythema.  Eyes:     Extraocular Movements: Extraocular movements intact.     Pupils: Pupils are equal, round, and  reactive to light.  Cardiovascular:     Rate and Rhythm: Normal rate.     Pulses: Normal pulses.  Pulmonary:     Effort: Pulmonary effort is normal.  Abdominal:     Palpations: Abdomen is soft.  Musculoskeletal:        General: No swelling or tenderness. Normal range of motion.     Cervical back: Normal range of motion.  Skin:    Comments: Back incision dressed. No visible drainage present.   Neurological:     Mental Status: He is alert.     Comments: Alert and oriented x 3. Normal insight and awareness. Intact Memory. Normal language and speech. Cranial nerve exam unremarkable. MMT: BUE 5/5 prox to distal. RLE 3+ HF, KE, 4/5 ADF and APF. LLE 4- HF, KE and 4/5 ADF, 4+ APF. Sensory exam normal for light touch and pain in all 4 limbs. No limb ataxia or cerebellar signs. No abnormal tone appreciated. DTR's were at knees and absent at ankles.     Psychiatric:        Mood and Affect: Mood normal.  Behavior: Behavior normal.       Lab Results Last 24 Hours  No results found for this or any previous visit (from the past 24 hours).   Imaging Results (Last 48 hours)  No results found.     Assessment/Plan: Diagnosis: Lumbar stenosis with neurogenic claudication status post decompression and fusion as well as exploration and evacuation of seroma.   Does the need for close, 24 hr/day medical supervision in concert with the patient's rehab needs make it unreasonable for this patient to be served in a less intensive setting? Yes Co-Morbidities requiring supervision/potential complications:  -Significant postoperative pain -CKD 3 -neurogenic bowel -wound care considerations Due to bladder management, bowel management, safety, skin/wound care, disease management, medication administration, pain management, and patient education, does the patient require 24 hr/day rehab nursing? Yes Does the patient require coordinated care of a physician, rehab nurse, therapy disciplines of PT, OT to  address physical and functional deficits in the context of the above medical diagnosis(es)? Yes Addressing deficits in the following areas: balance, endurance, locomotion, strength, transferring, bowel/bladder control, bathing, dressing, feeding, grooming, toileting, and psychosocial support Can the patient actively participate in an intensive therapy program of at least 3 hrs of therapy per day at least 5 days per week? Yes The potential for patient to make measurable gains while on inpatient rehab is excellent Anticipated functional outcomes upon discharge from inpatient rehab are modified independent and supervision  with PT, modified independent and supervision with OT, n/a with SLP. Estimated rehab length of stay to reach the above functional goals is: 11-13 days Anticipated discharge destination: Home Overall Rehab/Functional Prognosis: excellent   POST ACUTE RECOMMENDATIONS: This patient's condition is appropriate for continued rehabilitative care in the following setting: CIR Patient has agreed to participate in recommended program. Yes Note that insurance prior authorization may be required for reimbursement for recommended care.   Comment: Pt has home nurse who can provide some physical assistance as needed.      MEDICAL RECOMMENDATIONS: Pt is requiring frequent IV dilaudid . Probably would benefit from a long-acting opiate in the short term to more consistently control pain. Low dose oxycontin  CR 10mg  q12 might be a starting point.      I have personally performed a face to face diagnostic evaluation of this patient. Additionally, I have examined the patient's medical record including any pertinent labs and radiographic images.     Thanks,   Arthea ONEIDA Gunther, MD 12/04/2023          Routing History

## 2023-12-05 NOTE — Plan of Care (Signed)
  Problem: Clinical Measurements: Goal: Will remain free from infection Outcome: Progressing Goal: Respiratory complications will improve Outcome: Progressing   Problem: Pain Managment: Goal: General experience of comfort will improve and/or be controlled Outcome: Progressing   Problem: Safety: Goal: Ability to remain free from injury will improve Outcome: Progressing

## 2023-12-05 NOTE — Progress Notes (Signed)
 [name], MD  Physician Physical Medicine and Rehabilitation   PMR Pre-admission    Signed   Date of Service: 12/03/2023  2:44 PM  Related encounter: ED to Hosp-Admission (Current) from 11/30/2023 in [facility]   Signed     Expand All Collapse All  Show:Clear all [x] Written[x] Templated[] Copied  Added by: [x] Lillith Mcneff, Heron MATSU, RN[x] Yvone Cohens, Lauren SHAUNNA, CCC-SLP  [] Hover for details PMR Admission Coordinator Pre-Admission Assessment   Patient: Miguel Williams is an 78 y.o., male MRN: [id number] DOB: [birth year] Height: 5' 8 (172.7 cm) Weight: 88.5 kg                                                                                                                                                  Insurance Information HMO:     PPO: yes     PCP:      IPA:      80/20:      OTHER:  PRIMARY: UHC Medicare      Policy#: [id number] ; Medicare [id number]     Subscriber: patient CM Name: Grayce      Phone#: [phone] option 3     Fax#: [phone] Pre-Cert#: [id number]   approved 8/12 until 8/19   Employer:  Benefits:  Phone #: online-[phone]     Name:  Eff. Date: 04/26/23-still active     Deduct: does not have one      Out of Pocket Max: $3,800 ($2,040.78 met)      Life Max: NA  CIR: $355/day co-pay for days 1-5, 100% coverage for days 6+      SNF: 100% coverage for days 1-20, $203 co-pay/day for days 21-100 Outpatient: $20 co-pay/visit     Co-Pay:  Home Health: 100% coverage      Co-Pay:  DME: 80% coverage     Co-Pay: 20% co-insurance Providers: in-network SECONDARY:       Policy#:       Phone#:    Artist:       Phone#:    The Data processing manager" for patients in Inpatient Rehabilitation Facilities with attached "Privacy Act Statement-Health Care Records" was provided and verbally reviewed with: Patient   Emergency Contact Information Contact Information       Name Relation  Home Work Mobile    Superior Other     [phone]    Rolene Merlynn Benne [phone]   [phone]    McGrath,Denise Sister     [phone]         Other Contacts   None on File      Current Medical History  Patient Admitting Diagnosis: epidural hematoma and wound hematoma s/p I&D for evacuation of seroma History of Present Illness: Pt is a 78 year old male with medical hx significant for: posterior lumbar fusion L4-5 and L5-S1 (11/15/23). Pt presented  to [facility] on 11/30/23 d/t low back pain and weakness. Pt reported bilateral leg pain ever since neurosurgical procedure ~2 weeks prior.    Pt has had falls. Imaging showed large what looks like epidural hematoma and wound hematoma. Pt underwent reexploration of lumbar wound for evacuation of hematoma by Dr. [name] on 12/01/23. Therapy evaluations completed and CIR recommended d/t pt's deficits in functional mobility.   Patient's medical record from [facility] has been reviewed by the rehabilitation admission coordinator and physician.   Past Medical History      Past Medical History:  Diagnosis Date   Abdominal pain, lower     Agitation     Allergy, unspecified, initial encounter     Anxiety disorder     Arthritis     Asthma      As a child   Bipolar disorder [facility])      Patient denies   Cancer [facility])      Bladder   Cancer (HCC)      Skin cancer   Chronic kidney disease, stage III (moderate) (HCC)     Chronic kidney disease, stage III (moderate) (HCC)     COPD (chronic obstructive pulmonary disease) (HCC)      Pt denies (10/2023)   Coronary atherosclerosis of native coronary artery     Esophageal reflux     Fatigue     Fungal granuloma     Hip pain     History of 2019 novel coronavirus disease (COVID-19)     History of endoscopy 02/00/2011   History of kidney stones     Hypertension     Hypo-osmolality and hyponatremia     Hypokalemia     Hypothyroidism     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 now has 3oz of wine every other day (as of 10/2023)   Umbilical hernia     Unspecified fall, subsequent encounter     Vitamin D deficiency, unspecified     Wernicke's encephalopathy 06/23/2019        Has the patient had major surgery during 100 days prior to admission? Yes   Family History  family history includes Lung cancer in his father; Multiple myeloma in his mother.   Current Medications   Current Medications    Current Facility-Administered Medications:    acetaminophen  (TYLENOL ) tablet 650 mg, 650 mg, Oral, Q4H PRN, 650 mg at 12/01/23 0435 **OR** acetaminophen  (TYLENOL ) suppository 650 mg, 650 mg, Rectal, Q4H PRN, [name], MD   artificial tears ophthalmic solution 1 drop, 1 drop, Both Eyes, QID PRN, Lyle, Dwayne A, RPH   atorvastatin  (LIPITOR) tablet 40 mg, 40 mg, Oral, q AM, Johnanna Credit Caylin, PA-C, 40 mg at 12/05/23 9391   bisacodyl  (DULCOLAX) EC tablet 5 mg, 5 mg, Oral, Daily PRN, Meyran, Suzen Lacks, NP, 5  mg at 12/05/23 1052   chlorhexidine  (PERIDEX ) 0.12 % solution 15 mL, 15 mL, Mouth/Throat, Once **OR** Oral care mouth rinse, 15 mL, Mouth Rinse, Once, Leopoldo Bruckner, MD   clonazePAM  (KLONOPIN ) tablet 0.5 mg, 0.5 mg, Oral, QHS PRN, Johnanna Credit Caylin, PA-C, 0.5 mg at 12/04/23 2126   colchicine  tablet 0.6 mg, 0.6 mg, Oral, q AM, Tomlinson, Sara Caylin, PA-C, 0.6 mg at 12/05/23 9390   cyanocobalamin  (VITAMIN B12) tablet 500 mcg, 500 mcg, Oral, Daily, Johnanna Credit  Caylin, PA-C, 500 mcg at 12/05/23 1053   cyclobenzaprine  (FLEXERIL ) tablet 5 mg, 5 mg, Oral, TID PRN, [name], MD, 5 mg at 12/05/23 1054   docusate sodium  (COLACE) capsule 100 mg, 100 mg, Oral, BID, Meyran, Suzen Lacks, NP, 100 mg at 12/05/23 1054   DULoxetine  (CYMBALTA ) DR capsule 60 mg, 60 mg, Oral, q AM, Johnanna Credit Caylin, PA-C, 60 mg at 12/05/23 9390   dutasteride  (AVODART ) capsule 0.5 mg, 0.5 mg, Oral, QPM, Johnanna Credit Caylin, PA-C, 0.5 mg at 12/04/23 1816   ezetimibe  (ZETIA ) tablet 10 mg, 10 mg, Oral, q AM, Tomlinson, Sara Caylin, PA-C, 10 mg at 12/05/23 9391   ferrous sulfate  tablet 325 mg, 325 mg, Oral, TID WC, Johnanna Credit Caylin, PA-C, 325 mg at 12/05/23 9242   HYDROmorphone  (DILAUDID ) injection 0.5 mg, 0.5 mg, Intravenous, Q2H PRN, [name], MD, 0.5 mg at 12/03/23 0620   irbesartan  (AVAPRO ) tablet 75 mg, 75 mg, Oral, Daily, Johnanna Credit Caylin, PA-C, 75 mg at 12/05/23 1055   labetalol  (NORMODYNE ) injection 10 mg, 10 mg, Intravenous, Q2H PRN, [name], MD   levothyroxine  (SYNTHROID ) tablet 100 mcg, 100 mcg, Oral, Every Sunday, Lyle, Dwayne A, RPH, 100 mcg at 12/03/23 9376   levothyroxine  (SYNTHROID ) tablet 50 mcg, 50 mcg, Oral, Once per day on Monday Tuesday Wednesday Thursday Friday Saturday, Tomlinson, Sara Caylin, PA-C, 50 mcg at 12/05/23 9391   loperamide  (IMODIUM ) capsule 2 mg, 2 mg, Oral, QID PRN, Tomlinson, Sara Caylin, PA-C   menthol -cetylpyridinium (CEPACOL) lozenge 3 mg, 1 lozenge, Oral, PRN, 3 mg at 12/01/23 2300 **OR** phenol (CHLORASEPTIC) mouth spray 1 spray, 1 spray, Mouth/Throat, PRN, [name], MD   oxyCODONE  (Oxy IR/ROXICODONE ) immediate release tablet 5 mg, 5 mg, Oral, Q4H PRN, [name], MD, 5 mg at 12/05/23 1052   pantoprazole  (PROTONIX ) EC tablet 20 mg, 20 mg, Oral, Daily, Johnanna Credit Caylin, PA-C, 20 mg at 12/05/23 1055   sodium chloride  flush (NS) 0.9 % injection 3 mL, 3 mL, Intravenous, Q12H, [name], MD, 3 mL at 12/05/23 1055    sodium chloride  flush (NS) 0.9 % injection 3 mL, 3 mL, Intravenous, PRN, Cram, Gary, MD   tamsulosin  (FLOMAX ) capsule 0.4 mg, 0.4 mg, Oral, Daily, Johnanna Credit Caylin, PA-C, 0.4 mg at 12/05/23 1055   thiamine  (VITAMIN B1) tablet 100 mg, 100 mg, Oral, Daily, Johnanna Credit Caylin, PA-C, 100 mg at 12/05/23 1054     Patients Current Diet:  Diet Order                  Diet regular Room service appropriate? Yes; Fluid consistency: Thin  Diet effective now                       Precautions / Restrictions Precautions Precautions: Back, Fall Precaution Booklet Issued: Yes (comment) Precaution/Restrictions Comments: only recalls 2/3 back precautions, needs cues for compliance; multiple recent falls Spinal Brace: Lumbar corset, Applied in sitting position Restrictions Weight Bearing Restrictions Per Provider Order: No  Has the patient had 2 or more falls or a fall with injury in the past year?Yes   Prior Activity Level Community (5-7x/wk): gets out of house ~3 days/week (MD appointments)   Prior Functional Level Prior Function Prior Level of Function : Independent/Modified Independent, History of Falls (last six months) Mobility Comments: Has been needing a little help and has been using RW more than cane after recent back surgery. Prior to back surgery, pt was mod I using cane mostly but has RW available when needed. Pt reports ~x6 falls since January ADLs Comments: RN manages meds and does driving and also does cooking and cleaning; pt dresses and bathes himself   Self Care: Did the patient need help bathing, dressing, using the toilet or eating?  Independent   Indoor Mobility: Did the patient need assistance with walking from room to room (with or without device)? Independent   Stairs: Did the patient need assistance with internal or external stairs (with or without device)? Independent (for the entry stairs into the house. Avoids steps in general)   Functional Cognition:  Did the patient need help planning regular tasks such as shopping or remembering to take medications? Needed some help   Patient Information Are you of Hispanic, Latino/a,or Spanish origin?: A. No, not of Hispanic, Latino/a, or Spanish origin What is your race?: A. White Do you need or want an interpreter to communicate with a doctor or health care staff?: 0. No   Patient's Response To:  Health Literacy and Transportation Is the patient able to respond to health literacy and transportation needs?: Yes Health Literacy - How often do you need to have someone help you when you read instructions, pamphlets, or other written material from your doctor or pharmacy?: Never In the past 12 months, has lack of transportation kept you from medical appointments or from getting medications?: No In the past 12 months, has lack of transportation kept you from meetings, work, or from getting things needed for daily living?: No   Journalist, newspaper / Equipment Home Equipment: Shower seat, Agricultural consultant (2 wheels), [employer] - single point   Prior Device Use: Indicate devices/aids used by the patient prior to current illness, exacerbation or injury? Walker and cane   Current Functional Level Cognition   Orientation Level: Oriented X4    Extremity Assessment (includes Sensation/Coordination)   Upper Extremity Assessment: Generalized weakness  Lower Extremity Assessment: Defer to PT evaluation RLE Deficits / Details: MMT scores of 4- hip flexion, 4- knee extension, 3+ ankle dorsiflexion; denied numbness/tingling; reports L leg was more painful and an issue prior to back surgery but now his R leg is more painful and weak LLE Deficits / Details: MMT scores of 4- hip flexion, 4+ knee extension, 4- ankle dorsiflexion; denied numbness/tingling; reports L leg was more painful and an issue prior to back surgery but now his R leg is more painful and weak     ADLs   Overall ADL's : Needs  assistance/impaired Eating/Feeding: Independent, Sitting Grooming: Set up, Sitting Upper Body Bathing: Set up, Sitting Lower Body Bathing: Moderate assistance, Sit to/from stand Upper Body Dressing : Set up, Sitting Lower Body Dressing: Moderate assistance, Sit to/from stand Toilet Transfer: Minimal assistance Toileting- Clothing Manipulation and Hygiene: Minimal assistance, Sitting/lateral lean Functional mobility during ADLs: Minimal assistance, Rolling walker (2 wheels) General ADL Comments: limited by BLE weakness, dizziness and poor activity tolerance     Mobility   Overal bed mobility: Needs Assistance Bed Mobility: Rolling, Sidelying to Sit  Rolling: Supervision Sidelying to sit: Contact guard assist Sit to sidelying: Contact guard assist General bed mobility comments: cues for log roll technique, CGA for safety     Transfers   Overall transfer level: Needs assistance Equipment used: Rolling walker (2 wheels) Transfers: Sit to/from Stand Sit to Stand: Min assist, Contact guard assist General transfer comment: cues for hand placement with fair carryover. min A to steady on rise and gain standing balance on initial stand from EOB, CGA from recliner x2     Ambulation / Gait / Stairs / Wheelchair Mobility   Ambulation/Gait Ambulation/Gait assistance: Min assist, +2 safety/equipment (chair follow) Gait Distance (Feet): 45 Feet (+ 55') Assistive device: Rolling walker (2 wheels) Gait Pattern/deviations: Step-through pattern, Decreased step length - right, Decreased step length - left, Decreased stride length, Knee flexed in stance - right, Knee flexed in stance - left, Trunk flexed General Gait Details: Pt takes slow, small, unsteady steps. Pt RLE crossing midline in swing phase and R knee flexing with valgus in stance. Repeated multi-modal cues provided to extend his knees during stance phase and for wider BOS, but poor carryover noted. MinA provided continuously for balance and  safety with close chair follow as pt fatigues quickly needing x1 seated rest break Gait velocity: variable Gait velocity interpretation: <1.31 ft/sec, indicative of household ambulator     Posture / Balance Dynamic Sitting Balance Sitting balance - Comments: sitting EOB Balance Overall balance assessment: Needs assistance, History of Falls Sitting-balance support: No upper extremity supported, Feet supported Sitting balance-Leahy Scale: Fair Sitting balance - Comments: sitting EOB Standing balance support: Bilateral upper extremity supported, During functional activity, Reliant on assistive device for balance Standing balance-Leahy Scale: Poor Standing balance comment: reliant on RW and minA during dynamic tasks, able to maintain standing at sink with single UE support to brush teeth     Special considerations/ Life events Skin Ecchymosis: arm/left; Wound: Surgical Incision-back        Previous Home Environment  Living Arrangements: Non-relatives/Friends  Lives With: Other (Comment) (home health nurse) Available Help at Discharge: Home health, Available 24 hours/day, Personal care attendant Type of Home: House Home Layout: Able to live on main level with bedroom/bathroom, Two level Alternate Level Stairs-Number of Steps: n/a Home Access: Stairs to enter Entrance Stairs-Rails: Left, Right, Can reach both Entrance Stairs-Number of Steps: 2 Bathroom Shower/Tub: Health visitor: Standard Bathroom Accessibility: Yes How Accessible: Accessible via walker Home Care Services: No Additional Comments: 1 fall in feb   Discharge Living Setting Plans for Discharge Living Setting: Patient's home Type of Home at Discharge: House Discharge Home Layout: Able to live on main level with bedroom/bathroom Discharge Home Access: Stairs to enter Entrance Stairs-Rails: Can reach both Entrance Stairs-Number of Steps: 2 Discharge Bathroom Shower/Tub: Walk-in shower Discharge  Bathroom Toilet: Standard Discharge Bathroom Accessibility: Yes How Accessible: Accessible via walker Does the patient have any problems obtaining your medications?: No   Social/Family/Support Systems Anticipated Caregiver: [name] Ford, friend/home health nurse Anticipated Caregiver's Contact Information: [phone] Caregiver Availability: 24/7 Discharge Plan Discussed with Primary Caregiver: Yes Is Caregiver In Agreement with Plan?: Yes Does Caregiver/Family have Issues with Lodging/Transportation while Pt is in Rehab?: No   Goals Patient/Family Goal for Rehab: Mod I-Supervision: PT/OT Expected length of stay: 11-13 days Pt/Family Agrees to Admission and willing to participate: Yes Program Orientation Provided & Reviewed with Pt/Caregiver Including Roles  & Responsibilities: Yes   Decrease burden of Care through IP rehab admission: NA   Possible need for  SNF placement upon discharge:Not anticipated   Patient Condition: This patient's condition remains as documented in the consult dated 12/04/23, in which the Rehabilitation Physician determined and documented that the patient's condition is appropriate for intensive rehabilitative care in an inpatient rehabilitation facility. Will admit to inpatient rehab today.   Preadmission Screen Completed By:  Tinnie SHAUNNA Yvone Delayne, CCC-SLP, 12/05/2023 2:15 PM ______________________________________________________________________   Discussed status with Dr. Babs on 12/05/23 at 1416 and received approval for admission today.   Admission Coordinator:  Tinnie SHAUNNA Yvone Delayne, time 8583 Date 12/05/23            Revision History

## 2023-12-05 NOTE — H&P (Signed)
 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 He was discharged to home  07/24 but readmitted on 11/30/23 with reports of ferquent fall with BLE weakness and  progressive back and leg pain for a few days. pain. MRI spine done revealing large 6.8 cm posterior paraspinal fluid collection extending into dorsal canal with anterior displacement of cauda equina nerve roots and severe L4/5 and moderate L5-S1 canal stenosis.  He underwent re-exploration of lumbar wound with evacuation of old blood with seroma and placement of JP drain on 08/08 by Dr. [name]. [name] removed today and IV antibiotics d/c   He continues to be limited by RLE>LLE weakness with decreased posture, right knee instability,  dizziness, unsteady gait with multimodal cues and poor carryover.  He requires min to +2 min assist with mobility and ,om assist with ADLs. He was independent prior to surgery and CIR recommended due to functional decline.        Review of Systems  HENT:  Positive for hearing loss and tinnitus.   Respiratory:  Negative for cough and shortness of breath.   Cardiovascular:  Negative for chest pain and palpitations.  Gastrointestinal:  Positive for heartburn. Negative for nausea.  Genitourinary:  Negative for dysuria and frequency.  Musculoskeletal:  Positive for myalgias.       Spasms   Neurological:  Positive for weakness. Negative for dizziness, tingling and headaches.  Psychiatric/Behavioral:  The patient does not have insomnia.            Past Medical  History:  Diagnosis Date   Abdominal pain, lower     Agitation     Allergy, unspecified, initial encounter     Anxiety disorder     Arthritis     Asthma      As a child   Bipolar disorder [facility])      Patient denies   Cancer [facility])      Bladder   Cancer (HCC)      Skin cancer   Chronic kidney disease, stage III (moderate) (HCC)     Chronic kidney disease, stage III (moderate) (HCC)     COPD (chronic obstructive pulmonary disease) (HCC)      Pt denies (10/2023)   Coronary atherosclerosis of native coronary artery     Esophageal reflux     Fatigue     Fungal granuloma     Hip pain     History of 2019 novel coronavirus disease (COVID-19)     History of endoscopy 02/00/2011   History of kidney stones     Hypertension     Hypo-osmolality and hyponatremia     Hypokalemia     Hypothyroidism     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)     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)     Insomnia due to medical condition     Iron deficiency anemia secondary to blood loss (chronic)     Iron deficiency anemia, unspecified     Lability emotional     Low back pain  Moderate protein-calorie malnutrition (HCC)     Pneumonia     PONV (postoperative nausea and vomiting)     Presence of left artificial hip joint      pt denies   Restlessness     Substance abuse (HCC)      2 shots of burbon daily until 07/2023. now has 3oz of wine every other day (as of 10/2023)   Umbilical hernia     Unspecified fall, subsequent encounter     Vitamin D deficiency, unspecified     Wernicke's encephalopathy 06/23/2019               Past Surgical History:  Procedure Laterality Date   CATARACT EXTRACTION W/ INTRAOCULAR LENS IMPLANT Bilateral     COLONOSCOPY   04/2023   COLONOSCOPY W/ ENDOSCOPIC US        CYSTOSCOPY WITH FULGERATION N/A 03/09/2023    Procedure: CYSTOSCOPY WITH FULGERATION WITH CLOT EVACUATION;  Surgeon: Miguel Lonni Righter, MD;  Location: [facility];  Service: Urology;  Laterality: N/A;   CYSTOSCOPY WITH URETHRAL DILATATION N/A 03/08/2023    Procedure: CYSTOSCOPY;  Surgeon: Miguel Lonni Righter, MD;  Location: [facility];  Service: Urology;  Laterality: N/A;  30 MINUTES   ESOPHAGOGASTRODUODENOSCOPY       LUMBAR WOUND DEBRIDEMENT N/A 12/01/2023    Procedure: LUMBAR WOUND DEBRIDEMENT;  Surgeon: Miguel Kuba, MD;  Location: [facility] OR;  Service: Neurosurgery;  Laterality: N/A;   TONSILLECTOMY   04/25/1951               Family History  Problem Relation Age of Onset   Multiple myeloma Mother     Lung cancer Father            Social History: Single--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 He  reports that he has never smoked. He has never used smokeless tobacco. He quit drinking.  He reports that he does not currently use drugs.     Allergies       Allergies  Allergen Reactions   Baclofen Other (See Comments)      Hallucinations- required admit to hospital for adverse drug rxn.   Bacitracin-Polymyxin B Hives   Tegretol  [Carbamazepine ] Hives   Allopurinol Other (See Comments)      Had a bad reaction   Neomycin Other (See Comments)      Allergic skin reaction   Sulfa Antibiotics Hives and Rash              Medications Prior to Admission  Medication Sig Dispense Refill   acetaminophen  (TYLENOL ) 500 MG tablet Take 1 tablet (500 mg total) by mouth every 6 (six) hours as needed for mild pain, moderate pain or fever. 30 tablet 0   atorvastatin  (LIPITOR) 40 MG tablet Take 40 mg by mouth in the morning.       clonazePAM  (KLONOPIN ) 0.5 MG tablet Take 0.5 mg by mouth at bedtime as needed (sleep/anxiety).       colchicine  0.6 MG tablet Take 0.6 mg by mouth in the morning.       cyanocobalamin  (VITAMIN B12) 500 MCG tablet Take 500 mcg by mouth daily.       DULoxetine  (CYMBALTA ) 60 MG capsule Take 60 mg by mouth in the morning.       dutasteride  (AVODART ) 0.5 MG capsule Take 1 capsule (0.5 mg  total) by mouth every other day. (Patient taking differently: Take 0.5 mg by mouth every evening.) 30 capsule 0  ezetimibe  (ZETIA ) 10 MG tablet Take 10 mg by mouth in the morning.       ferrous sulfate  325 (65 FE) MG EC tablet Take 325 mg by mouth 3 (three) times daily with meals.       Inulin  (FIBER CHOICE PO) Take 2 capsules by mouth daily.       lansoprazole (PREVACID) 30 MG capsule Take 30 mg by mouth in the morning and at bedtime.       levothyroxine  (SYNTHROID ) 50 MCG tablet Take 50-100 mcg by mouth See admin instructions. Take 2 tablets (100 mcg) by mouth in the morning on Sundays.  Take 1 tablet (50 mcg) by mouth in the morning on Mondays, Tuesdays, Wednesdays, Thursdays, Fridays & Saturdays.       SYSTANE COMPLETE PF 0.6 % SOLN Place 1 drop into both eyes 4 (four) times daily as needed (for dryness).       Tamsulosin  HCl (FLOMAX ) 0.4 MG CAPS TAKE 1 CAPSULE (0.4 MG TOTAL) BY MOUTH DAILY. 30 capsule 6   traMADol  (ULTRAM ) 50 MG tablet Take 50 mg by mouth every 6 (six) hours as needed (pain.).       valsartan (DIOVAN) 80 MG tablet Take 80 mg by mouth at bedtime.       VITAMIN D, CHOLECALCIFEROL , PO Take 1 capsule by mouth daily.       [DISCONTINUED] HYDROcodone -acetaminophen  (NORCO/VICODIN) 5-325 MG tablet Take 2 tablets by mouth every 4 (four) hours as needed for severe pain (pain score 7-10). 30 tablet 0   [DISCONTINUED] tiZANidine  (ZANAFLEX ) 2 MG tablet Take 2 mg by mouth every 6 (six) hours as needed for muscle spasms.       betamethasone dipropionate 0.05 % cream Apply 1 Application topically 2 (two) times daily as needed (skin irritation.).       calcium -vitamin D (OSCAL WITH D) 500-5 MG-MCG tablet Take 1 tablet by mouth.       loperamide  (IMODIUM  A-D) 2 MG tablet Take 2 mg by mouth 4 (four) times daily as needed for diarrhea or loose stools.       thiamine  (VITAMIN B-1) 100 MG tablet Take 1 tablet (100 mg total) by mouth daily.                  Home: Home Living Family/patient  expects to be discharged to:: Private residence Living Arrangements: Non-relatives/Friends Available Help at Discharge: Home health, Available 24 hours/day, Personal care attendant Type of Home: House Home Access: Stairs to enter [employer] of Steps: 2 Entrance Stairs-Rails: Left, Right, Can reach both Home Layout: Able to live on main level with bedroom/bathroom, Two level Alternate Level Stairs-Number of Steps: n/a Bathroom Shower/Tub: Health visitor: Standard Bathroom Accessibility: Yes Home Equipment: Information systems manager, Agricultural consultant (2 wheels), [employer] - single point Additional Comments: 1 fall in feb  Lives With: Other (Comment) (home health nurse)   Functional History: Prior Function Prior Level of Function : Independent/Modified Independent, History of Falls (last six months) Mobility Comments: Has been needing a little help and has been using RW more than cane after recent back surgery. Prior to back surgery, pt was mod I using cane mostly but has RW available when needed. Pt reports ~x6 falls since January ADLs Comments: RN manages meds and does driving and also does cooking and cleaning; pt dresses and bathes himself   Functional Status:  Mobility: Bed Mobility Overal bed mobility: Needs Assistance Bed Mobility: Rolling, Sidelying to Sit Rolling: Supervision Sidelying to sit: Contact guard  assist Sit to sidelying: Contact guard assist General bed mobility comments: cues for log roll technique, CGA for safety Transfers Overall transfer level: Needs assistance Equipment used: Rolling walker (2 wheels) Transfers: Sit to/from Stand Sit to Stand: Min assist, Contact guard assist General transfer comment: cues for hand placement with fair carryover. min A to steady on rise and gain standing balance on initial stand from EOB, CGA from recliner x2 Ambulation/Gait Ambulation/Gait assistance: Min assist, +2 safety/equipment (chair follow) Gait Distance  (Feet): 55 Feet Assistive device: Rolling walker (2 wheels) Gait Pattern/deviations: Step-through pattern, Decreased step length - right, Decreased step length - left, Decreased stride length, Knee flexed in stance - right, Knee flexed in stance - left, Trunk flexed General Gait Details: Pt takes slow, small, unsteady steps. Pt RLE crossing midline in swing and R knee flexing with valgus in stance. Repeated multi-modal cues provided to extend his knees during stance phase and for wider BOS, but poor carryover noted. MinA provided continuously for balance and safety with close chair follow. Gait velocity: reduced Gait velocity interpretation: <1.31 ft/sec, indicative of household ambulator   ADL: ADL Overall ADL's : Needs assistance/impaired Eating/Feeding: Independent, Sitting Grooming: Set up, Sitting Upper Body Bathing: Set up, Sitting Lower Body Bathing: Moderate assistance, Sit to/from stand Upper Body Dressing : Set up, Sitting Lower Body Dressing: Moderate assistance, Sit to/from stand Toilet Transfer: Minimal assistance Toileting- Clothing Manipulation and Hygiene: Minimal assistance, Sitting/lateral lean Functional mobility during ADLs: Minimal assistance, Rolling walker (2 wheels) General ADL Comments: limited by BLE weakness, dizziness and poor activity tolerance   Cognition: Cognition Orientation Level: Oriented X4 Cognition Arousal: Alert Behavior During Therapy: WFL for tasks assessed/performed, Impulsive     Blood pressure 127/62, pulse 83, temperature 98.3 F (36.8 C), temperature source Oral, resp. rate 18, height 5' 8 (1.727 m), weight 88.5 kg, SpO2 97%. Physical Exam Vitals and nursing note reviewed.  Constitutional:      General: He is not in acute distress.    Appearance: Normal appearance. He is not ill-appearing.  HENT:     Head: Normocephalic.     Nose: Nose normal.     Mouth/Throat:     Mouth: Mucous membranes are moist.  Eyes:     Extraocular  Movements: Extraocular movements intact.     Pupils: Pupils are equal, round, and reactive to light.  Cardiovascular:     Rate and Rhythm: Normal rate and regular rhythm.     Heart sounds: No murmur heard.    No gallop.  Pulmonary:     Effort: Pulmonary effort is normal. No respiratory distress.     Breath sounds: No wheezing.  Abdominal:     General: There is no distension.     Palpations: Abdomen is soft.     Tenderness: There is no abdominal tenderness.  Musculoskeletal:        General: Tenderness (LB and proximal LE's as well as medial arch with palpation) present.     Cervical back: Normal range of motion.  Skin:    General: Skin is warm.     Comments: Back incision noted to be  indurated with honeycomb dressing in place, mild serous drainage.   Neurological:     Mental Status: He is alert.     Deep Tendon Reflexes: Reflexes abnormal.     Comments: Alert and oriented x 3. Normal insight and awareness. Intact Memory. Normal language and speech. Cranial nerve exam unremarkable. MMT: BUE 4+/5 prox to distal. RLE 3/5 HF, KE and  4/5 ADF/PF. LLE 4-/5 HF, KE and 4+ ADF/PF. Pt with mild proprioceptive deficits in LE's. DTR's trace. No abnl resting tone.    Psychiatric:        Mood and Affect: Mood normal.        Behavior: Behavior normal.       Lab Results Last 48 Hours  No results found for this or any previous visit (from the past 48 hours).   Imaging Results (Last 48 hours)  No results found.         Blood pressure 127/62, pulse 83, temperature 98.3 F (36.8 C), temperature source Oral, resp. rate 18, height 5' 8 (1.727 m), weight 88.5 kg, SpO2 97%.   Medical Problem List and Plan: 1. 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              -patient may shower if wound is covered             -ELOS/Goals: 11-13 days, mod I to supervision goals 2.   Antithrombotics: -DVT/anticoagulation:  Mechanical: Sequential compression devices, below knee Bilateral lower extremities             -antiplatelet therapy: N/A 3. Pain Management: Tylenol  prn mild pain or oxycodone  prn severe pain  -encourage pre-treatment with oxycodone  prior to therapies for better activity tolerance 4. Mood/Behavior/Sleep: LCSW to follow for evaluation and support.              -antipsychotic agents: N/A 5. Neuropsych/cognition: This patient is capable of making decisions on his own behalf. 6. Skin/Wound Care: Monitor incision for healing.  -dressing changes as needed. -Routine pressure relief measures.  7. Fluids/Electrolytes/Nutrition: Monitor I/O. Check CMET in am.  8.  HTN: Monitor BP TID--on Amlodipine  and Avapro .  9.  BPH; Continue Avodart  and Flomax  (changed to nights) 10. CKD III: Baseline SCr 1.38? on 08/07-->recheck labs in am 11.  Anemia: Hgb 11.5 on 08/07--> recheck cbc in am.  9.  H/o Gout: On colchicine .  10. Hypothyroid: On synthroid  for supplement.  11. Anxiety: continue Klonopin  at bedtime prn.  12. Hx bladder cancer: Treated with TURBT/infusion by Dr. Devere             -see #9             -monitor voiding patterns.             -I/O caths prn 13. Neurogenic bowel/constipation             -no bm since 8/7             -sorbitol  60cc tonight. If no bm, more aggressive approach tomorrow       Sharlet GORMAN Schmitz, PA-C 12/05/2023  I have personally performed a face to face diagnostic evaluation of this patient and formulated the key components of the plan.  Additionally, I have personally reviewed laboratory data, imaging studies, as well as relevant notes and concur with the physician assistant's documentation above.  The patient's status has not changed from the original H&P.  Any changes in documentation from the acute care chart have been noted above.  Arthea IVAR Gunther, MD, LEELLEN

## 2023-12-05 NOTE — Progress Notes (Signed)
   Inpatient Rehabilitation Admissions Coordinator   I have insurance approval and CIR bed to admit him to today. Dr [name], Luke Pean, patient ,acute team and [facility] made aware. I will make the arrangements  Heron Leavell, RN, MSN Rehab Admissions Coordinator [phone] 12/05/2023 2:14 PM

## 2023-12-05 NOTE — H&P (Signed)
 Physical Medicine and Rehabilitation Admission H&P    Chief Complaint  Patient presents with   Functional deficits due to lumbar stenosis with myelopathy    HPI: Miguel Williams is a 78 year old male with history of  bladder CA, CKD III, idiopathic aseptic  necrosis bilateral femurs, anxiety d/o. 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 He was discharged to home  07/24 but readmitted on 11/30/23 with reports of ferquent fall with BLE weakness and  progressive back and leg pain for a few days. pain. MRI spine done revealing large 6.8 cm posterior paraspinal fluid collection extending into dorsal canal with anterior displacement of cauda equina nerve roots and severe L4/5 and moderate L5-S1 canal stenosis.  He underwent re-exploration of lumbar wound with evacuation of old blood with seroma and placement of JP drain on 08/08 by Dr. [name]. [name] removed today and IV antibiotics d/c  He continues to be limited by RLE>LLE weakness with decreased posture, right knee instability,  dizziness, unsteady gait with multimodal cues and poor carryover.  He requires min to +2 min assist with mobility and ,om assist with ADLs. He was independent prior to surgery and CIR recommended due to functional decline.     Review of Systems  HENT:  Positive for hearing loss and tinnitus.   Respiratory:  Negative for cough and shortness of breath.   Cardiovascular:  Negative for chest pain and palpitations.  Gastrointestinal:  Positive for heartburn. Negative for nausea.  Genitourinary:  Negative for dysuria and frequency.  Musculoskeletal:  Positive for myalgias.       Spasms   Neurological:  Positive for weakness. Negative for dizziness, tingling and headaches.  Psychiatric/Behavioral:  The patient does not have insomnia.      Past Medical History:   Diagnosis Date   Abdominal pain, lower    Agitation    Allergy, unspecified, initial encounter    Anxiety disorder    Arthritis    Asthma    As a child   Bipolar disorder [facility])    Patient denies   Cancer [facility])    Bladder   Cancer (HCC)    Skin cancer   Chronic kidney disease, stage III (moderate) (HCC)    Chronic kidney disease, stage III (moderate) (HCC)    COPD (chronic obstructive pulmonary disease) (HCC)    Pt denies (10/2023)   Coronary atherosclerosis of native coronary artery    Esophageal reflux    Fatigue    Fungal granuloma    Hip pain    History of 2019 novel coronavirus disease (COVID-19)    History of endoscopy 02/00/2011   History of kidney stones    Hypertension    Hypo-osmolality and hyponatremia    Hypokalemia    Hypothyroidism    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)    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)    Insomnia due to medical condition    Iron deficiency anemia secondary to blood loss (chronic)    Iron deficiency anemia, unspecified    Lability emotional    Low back pain    Moderate protein-calorie malnutrition (HCC)    Pneumonia    PONV (postoperative nausea and vomiting)    Presence of left artificial hip joint    pt denies   Restlessness    Substance abuse (HCC)    2 shots of burbon daily  until 07/2023. now has 3oz of wine every other day (as of 10/2023)   Umbilical hernia    Unspecified fall, subsequent encounter    Vitamin D deficiency, unspecified    Wernicke's encephalopathy 06/23/2019    Past Surgical History:  Procedure Laterality Date   CATARACT EXTRACTION W/ INTRAOCULAR LENS IMPLANT Bilateral    COLONOSCOPY  04/2023   COLONOSCOPY W/ ENDOSCOPIC US      CYSTOSCOPY WITH FULGERATION N/A 03/09/2023   Procedure: CYSTOSCOPY WITH FULGERATION WITH CLOT EVACUATION;  Surgeon: Devere Lonni Righter, MD;  Location: [facility];  Service: Urology;  Laterality: N/A;   CYSTOSCOPY WITH URETHRAL  DILATATION N/A 03/08/2023   Procedure: CYSTOSCOPY;  Surgeon: Devere Lonni Righter, MD;  Location: [facility];  Service: Urology;  Laterality: N/A;  30 MINUTES   ESOPHAGOGASTRODUODENOSCOPY     LUMBAR WOUND DEBRIDEMENT N/A 12/01/2023   Procedure: LUMBAR WOUND DEBRIDEMENT;  Surgeon: [name], MD;  Location: [facility] OR;  Service: Neurosurgery;  Laterality: N/A;   TONSILLECTOMY  04/25/1951    Family History  Problem Relation Age of Onset   Multiple myeloma Mother    Lung cancer Father     Social History: Single--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 He  reports that he has never smoked. He has never used smokeless tobacco. He quit drinking.  He reports that he does not currently use drugs.   Allergies  Allergen Reactions   Baclofen Other (See Comments)    Hallucinations- required admit to hospital for adverse drug rxn.   Bacitracin-Polymyxin B Hives   Tegretol  [Carbamazepine ] Hives   Allopurinol Other (See Comments)    Had a bad reaction   Neomycin Other (See Comments)    Allergic skin reaction   Sulfa Antibiotics Hives and Rash    Medications Prior to Admission  Medication Sig Dispense Refill   acetaminophen  (TYLENOL ) 500 MG tablet Take 1 tablet (500 mg total) by mouth every 6 (six) hours as needed for mild pain, moderate pain or fever. 30 tablet 0   atorvastatin  (LIPITOR) 40 MG tablet Take 40 mg by mouth in the morning.     clonazePAM  (KLONOPIN ) 0.5 MG tablet Take 0.5 mg by mouth at bedtime as needed (sleep/anxiety).     colchicine  0.6 MG tablet Take 0.6 mg by mouth in the morning.     cyanocobalamin  (VITAMIN B12) 500 MCG tablet Take 500 mcg by mouth daily.     DULoxetine  (CYMBALTA ) 60 MG capsule Take 60 mg by mouth in the morning.     dutasteride  (AVODART ) 0.5 MG capsule Take 1 capsule (0.5 mg total) by mouth every other day. (Patient taking differently: Take 0.5 mg by mouth every evening.) 30 capsule 0   ezetimibe  (ZETIA ) 10 MG tablet Take  10 mg by mouth in the morning.     ferrous sulfate  325 (65 FE) MG EC tablet Take 325 mg by mouth 3 (three) times daily with meals.     Inulin  (FIBER CHOICE PO) Take 2 capsules by mouth daily.     lansoprazole (PREVACID) 30 MG capsule Take 30 mg by mouth in the morning and at bedtime.     levothyroxine  (SYNTHROID ) 50 MCG tablet Take 50-100 mcg by mouth See admin instructions. Take 2 tablets (100 mcg) by mouth in the morning on Sundays.  Take 1 tablet (50 mcg) by mouth in the morning on Mondays, Tuesdays, Wednesdays, Thursdays, Fridays & Saturdays.     SYSTANE COMPLETE PF 0.6 % SOLN Place 1 drop into  both eyes 4 (four) times daily as needed (for dryness).     Tamsulosin  HCl (FLOMAX ) 0.4 MG CAPS TAKE 1 CAPSULE (0.4 MG TOTAL) BY MOUTH DAILY. 30 capsule 6   traMADol  (ULTRAM ) 50 MG tablet Take 50 mg by mouth every 6 (six) hours as needed (pain.).     valsartan (DIOVAN) 80 MG tablet Take 80 mg by mouth at bedtime.     VITAMIN D, CHOLECALCIFEROL , PO Take 1 capsule by mouth daily.     [DISCONTINUED] HYDROcodone -acetaminophen  (NORCO/VICODIN) 5-325 MG tablet Take 2 tablets by mouth every 4 (four) hours as needed for severe pain (pain score 7-10). 30 tablet 0   [DISCONTINUED] tiZANidine  (ZANAFLEX ) 2 MG tablet Take 2 mg by mouth every 6 (six) hours as needed for muscle spasms.     betamethasone dipropionate 0.05 % cream Apply 1 Application topically 2 (two) times daily as needed (skin irritation.).     calcium -vitamin D (OSCAL WITH D) 500-5 MG-MCG tablet Take 1 tablet by mouth.     loperamide  (IMODIUM  A-D) 2 MG tablet Take 2 mg by mouth 4 (four) times daily as needed for diarrhea or loose stools.     thiamine  (VITAMIN B-1) 100 MG tablet Take 1 tablet (100 mg total) by mouth daily.        Home: Home Living Family/patient expects to be discharged to:: Private residence Living Arrangements: Non-relatives/Friends Available Help at Discharge: Home health, Available 24 hours/day, Personal care attendant Type  of Home: House Home Access: Stairs to enter [employer] of Steps: 2 Entrance Stairs-Rails: Left, Right, Can reach both Home Layout: Able to live on main level with bedroom/bathroom, Two level Alternate Level Stairs-Number of Steps: n/a Bathroom Shower/Tub: Health visitor: Standard Bathroom Accessibility: Yes Home Equipment: Information systems manager, Agricultural consultant (2 wheels), [employer] - single point Additional Comments: 1 fall in feb  Lives With: Other (Comment) (home health nurse)   Functional History: Prior Function Prior Level of Function : Independent/Modified Independent, History of Falls (last six months) Mobility Comments: Has been needing a little help and has been using RW more than cane after recent back surgery. Prior to back surgery, pt was mod I using cane mostly but has RW available when needed. Pt reports ~x6 falls since January ADLs Comments: RN manages meds and does driving and also does cooking and cleaning; pt dresses and bathes himself  Functional Status:  Mobility: Bed Mobility Overal bed mobility: Needs Assistance Bed Mobility: Rolling, Sidelying to Sit Rolling: Supervision Sidelying to sit: Contact guard assist Sit to sidelying: Contact guard assist General bed mobility comments: cues for log roll technique, CGA for safety Transfers Overall transfer level: Needs assistance Equipment used: Rolling walker (2 wheels) Transfers: Sit to/from Stand Sit to Stand: Min assist, Contact guard assist General transfer comment: cues for hand placement with fair carryover. min A to steady on rise and gain standing balance on initial stand from EOB, CGA from recliner x2 Ambulation/Gait Ambulation/Gait assistance: Min assist, +2 safety/equipment (chair follow) Gait Distance (Feet): 55 Feet Assistive device: Rolling walker (2 wheels) Gait Pattern/deviations: Step-through pattern, Decreased step length - right, Decreased step length - left, Decreased stride length,  Knee flexed in stance - right, Knee flexed in stance - left, Trunk flexed General Gait Details: Pt takes slow, small, unsteady steps. Pt RLE crossing midline in swing and R knee flexing with valgus in stance. Repeated multi-modal cues provided to extend his knees during stance phase and for wider BOS, but poor carryover noted. MinA provided continuously  for balance and safety with close chair follow. Gait velocity: reduced Gait velocity interpretation: <1.31 ft/sec, indicative of household ambulator    ADL: ADL Overall ADL's : Needs assistance/impaired Eating/Feeding: Independent, Sitting Grooming: Set up, Sitting Upper Body Bathing: Set up, Sitting Lower Body Bathing: Moderate assistance, Sit to/from stand Upper Body Dressing : Set up, Sitting Lower Body Dressing: Moderate assistance, Sit to/from stand Toilet Transfer: Minimal assistance Toileting- Clothing Manipulation and Hygiene: Minimal assistance, Sitting/lateral lean Functional mobility during ADLs: Minimal assistance, Rolling walker (2 wheels) General ADL Comments: limited by BLE weakness, dizziness and poor activity tolerance  Cognition: Cognition Orientation Level: Oriented X4 Cognition Arousal: Alert Behavior During Therapy: WFL for tasks assessed/performed, Impulsive   Blood pressure 127/62, pulse 83, temperature 98.3 F (36.8 C), temperature source Oral, resp. rate 18, height 5' 8 (1.727 m), weight 88.5 kg, SpO2 97%. Physical Exam Vitals and nursing note reviewed.  Constitutional:      General: He is not in acute distress.    Appearance: Normal appearance. He is not ill-appearing.  HENT:     Head: Normocephalic.     Nose: Nose normal.     Mouth/Throat:     Mouth: Mucous membranes are moist.  Eyes:     Extraocular Movements: Extraocular movements intact.     Pupils: Pupils are equal, round, and reactive to light.  Cardiovascular:     Rate and Rhythm: Normal rate and regular rhythm.     Heart sounds: No murmur  heard.    No gallop.  Pulmonary:     Effort: Pulmonary effort is normal. No respiratory distress.     Breath sounds: No wheezing.  Abdominal:     General: There is no distension.     Palpations: Abdomen is soft.     Tenderness: There is no abdominal tenderness.  Musculoskeletal:        General: Tenderness (LB and proximal LE's as well as medial arch with palpation) present.     Cervical back: Normal range of motion.  Skin:    General: Skin is warm.     Comments: Back incision noted to be  indurated with honeycomb dressing in place, mild serous drainage.   Neurological:     Mental Status: He is alert.     Deep Tendon Reflexes: Reflexes abnormal.     Comments: Alert and oriented x 3. Normal insight and awareness. Intact Memory. Normal language and speech. Cranial nerve exam unremarkable. MMT: BUE 4+/5 prox to distal. RLE 3/5 HF, KE and 4/5 ADF/PF. LLE 4-/5 HF, KE and 4+ ADF/PF. Pt with mild proprioceptive deficits in LE's. DTR's trace. No abnl resting tone.    Psychiatric:        Mood and Affect: Mood normal.        Behavior: Behavior normal.     No results found for this or any previous visit (from the past 48 hours). No results found.    Blood pressure 127/62, pulse 83, temperature 98.3 F (36.8 C), temperature source Oral, resp. rate 18, height 5' 8 (1.727 m), weight 88.5 kg, SpO2 97%.  Medical Problem List and Plan: 1. 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   -patient may shower if wound is covered  -ELOS/Goals: 11-13 days, mod I to supervision goals 2.  Antithrombotics: -DVT/anticoagulation:  Mechanical: Sequential compression devices, below knee Bilateral lower extremities  -antiplatelet therapy: N/A 3. Pain Management: Tylenol  prn mild pain or oxycodone  prn  severe pain 4. Mood/Behavior/Sleep: LCSW to follow for evaluation and support.    -antipsychotic agents: N/A 5. Neuropsych/cognition: This patient is capable of making decisions on his own behalf. 6. Skin/Wound Care: Monitor incision for healing.  -dressing changes as needed. -Routine pressure relief measures.  7. Fluids/Electrolytes/Nutrition: Monitor I/O. Check CMET in am.  8.  HTN: Monitor BP TID--on Amlodipine  and Avapro .  9.  BPH; Continue Avodart  and Flomax  (changed to nights) 10. CKD III: Baseline SCr 1.38? on 08/07-->recheck labs in am 11.  Anemia: Hgb 11.5 on 08/07--> recheck cbc in am.  9.  H/o Gout: On colchicine .  10. Hypothyroid: On synthroid  for supplement.  11. Anxiety: continue Klonopin  at bedtime prn.  12. Hx bladder cancer: Treated with TURBT/infusion by Dr. Devere  -see #9  -monitor voiding patterns.  -I/O caths prn 13. Neurogenic bowel/constipation  -no bm since 8/7  -sorbitol  30cc tonight. If no bm, more aggressive approach tomorrow    Miguel GORMAN Schmitz, PA-C 12/05/2023

## 2023-12-05 NOTE — Progress Notes (Signed)
 Physical Therapy Treatment Patient Details Name: Miguel Williams MRN: [id number] DOB: May 01, 1945 Today's Date: 12/05/2023   History of Present Illness Pt is a 78 yo man who presented 11/30/23 with low back pain and weakness along with several falls since his elective PLIF L4-S1 with decompressive laminectomies on 11/15/23. Workup has revealed large what looks like epidural hematoma and wound hematoma. S/p reexploration of lumbar wound for evacuation of hematoma 8/8. PMH of anxiety, cancer, CKD III, HLD, HTN, hypothyroidism    PT Comments  Pt resting in bed on arrival, pleasant and agreeable to session with steady progress towards acute goals. Pt continues to be limited in safe mobility by LE weakness, R>L, decreased activity tolerance and impaired balance/postural reactions. Pt progressing gait tolerance with x2 bouts with RW for support, min A to maintain balance as pt with continued R knee flexion in stance and +2 chair follow for safety as pt quick to fatigue needing seated rest x1. Continued education on importance of frequent mobilization with pt verbalizing understanding. Pt continues to require cues to recall and adhere to all precautions throughout session and mobility. Pt continues to benefit from skilled PT services to progress toward functional mobility goals.      If plan is discharge home, recommend the following: A little help with bathing/dressing/bathroom;Assistance with cooking/housework;Assist for transportation;Help with stairs or ramp for entrance;A little help with walking and/or transfers;Direct supervision/assist for medications management;Direct supervision/assist for financial management;Supervision due to cognitive status   Can travel by private vehicle        Equipment Recommendations  BSC/3in1;Wheelchair (measurements PT);Wheelchair cushion (measurements PT)    Recommendations for Other Services       Precautions / Restrictions Precautions Precautions:  Back;Fall Precaution Booklet Issued: Yes (comment) Recall of Precautions/Restrictions: Impaired Precaution/Restrictions Comments: only recalls 2/3 back precautions, needs cues for compliance; multiple recent falls Required Braces or Orthoses: Spinal Brace Spinal Brace: Lumbar corset;Applied in sitting position Restrictions Weight Bearing Restrictions Per Provider Order: No     Mobility  Bed Mobility Overal bed mobility: Needs Assistance Bed Mobility: Rolling, Sidelying to Sit Rolling: Supervision Sidelying to sit: Contact guard assist       General bed mobility comments: cues for log roll technique, CGA for safety    Transfers Overall transfer level: Needs assistance Equipment used: Rolling walker (2 wheels) Transfers: Sit to/from Stand Sit to Stand: Min assist, Contact guard assist           General transfer comment: cues for hand placement with fair carryover. min A to steady on rise and gain standing balance on initial stand from EOB, CGA from recliner x2    Ambulation/Gait Ambulation/Gait assistance: Min assist, +2 safety/equipment (chair follow) Gait Distance (Feet): 45 Feet (+ 55') Assistive device: Rolling walker (2 wheels) Gait Pattern/deviations: Step-through pattern, Decreased step length - right, Decreased step length - left, Decreased stride length, Knee flexed in stance - right, Knee flexed in stance - left, Trunk flexed Gait velocity: variable     General Gait Details: Pt takes slow, small, unsteady steps. Pt RLE crossing midline in swing phase and R knee flexing with valgus in stance. Repeated multi-modal cues provided to extend his knees during stance phase and for wider BOS, but poor carryover noted. MinA provided continuously for balance and safety with close chair follow as pt fatigues quickly needing x1 seated rest break   Stairs             Wheelchair Mobility     Tilt Bed  Modified Rankin (Stroke Patients Only)       Balance  Overall balance assessment: Needs assistance, History of Falls Sitting-balance support: No upper extremity supported, Feet supported Sitting balance-Leahy Scale: Fair     Standing balance support: Bilateral upper extremity supported, During functional activity, Reliant on assistive device for balance Standing balance-Leahy Scale: Poor Standing balance comment: reliant on RW and minA during dynamic tasks, able to maintain standing at sink with single UE support to brush teeth                            Communication Communication Communication: No apparent difficulties Factors Affecting Communication: Hearing impaired  Cognition Arousal: Alert Behavior During Therapy: WFL for tasks assessed/performed, Impulsive                             Following commands: Impaired Following commands impaired: Follows one step commands with increased time, Follows multi-step commands with increased time, Follows multi-step commands inconsistently    Cueing Cueing Techniques: Verbal cues, Tactile cues, Visual cues  Exercises      General Comments General comments (skin integrity, edema, etc.): VSS on RA, no c/o dizziness with mobility      Pertinent Vitals/Pain Pain Assessment Pain Assessment: Faces Faces Pain Scale: Hurts little more Pain Location: back, radiating through buttocks and R foot Pain Descriptors / Indicators: Discomfort, Grimacing, Guarding, Operative site guarding, Radiating Pain Intervention(s): Heat applied, Limited activity within patient's tolerance, Monitored during session, Premedicated before session, Repositioned (heat at foot and bottom)    Home Living                          Prior Function            PT Goals (current goals can now be found in the care plan section) Acute Rehab PT Goals PT Goal Formulation: With patient Time For Goal Achievement: 12/17/23 Progress towards PT goals: Progressing toward goals    Frequency     Min 3X/week      PT Plan      Co-evaluation              AM-PAC PT 6 Clicks Mobility   Outcome Measure  Help needed turning from your back to your side while in a flat bed without using bedrails?: A Little Help needed moving from lying on your back to sitting on the side of a flat bed without using bedrails?: A Little Help needed moving to and from a bed to a chair (including a wheelchair)?: A Little Help needed standing up from a chair using your arms (e.g., wheelchair or bedside chair)?: A Little Help needed to walk in hospital room?: Total Help needed climbing 3-5 steps with a railing? : A Lot 6 Click Score: 15    End of Session Equipment Utilized During Treatment: Gait belt;Back brace Activity Tolerance: Patient tolerated treatment well Patient left: in chair;with call bell/phone within reach;with chair alarm set Nurse Communication: Mobility status PT Visit Diagnosis: Unsteadiness on feet (R26.81);Pain;Other abnormalities of gait and mobility (R26.89);Muscle weakness (generalized) (M62.81);History of falling (Z91.81);Repeated falls (R29.6);Difficulty in walking, not elsewhere classified (R26.2)     Time: 8964-8945 PT Time Calculation (min) (ACUTE ONLY): 19 min  Charges:    $Gait Training: 8-22 mins PT General Charges $$ ACUTE PT VISIT: 1 Visit  Therisa SAUNDERS. PTA Acute Rehabilitation Services Office: [phone]   Therisa CHRISTELLA Boor 12/05/2023, 2:11 PM

## 2023-12-05 NOTE — Progress Notes (Signed)
   Inpatient Rehabilitation Admissions Coordinator   I met with patient at bedside and reviewed estimated cost of care pending insurance approval for CIR. I await [employer] medicare determination.  Heron Leavell, RN, MSN Rehab Admissions Coordinator [phone] 12/05/2023 11:17 AM

## 2023-12-05 NOTE — Progress Notes (Signed)
 Subjective: Patient reports doing well, ambulating better  Objective: Vital signs in last 24 hours: Temp:  [97.8 F (36.6 C)-98.3 F (36.8 C)] 98.3 F (36.8 C) (08/12 0756) Pulse Rate:  [78-99] 83 (08/12 0756) Resp:  [16-18] 18 (08/12 0756) BP: (98-148)/(58-70) 127/62 (08/12 0756) SpO2:  [95 %-97 %] 97 % (08/12 0756)  Intake/Output from previous day: 08/11 0701 - 08/12 0700 In: -  Out: 200 [Urine:200] Intake/Output this shift: Total I/O In: -  Out: 300 [Urine:300]  Neurologic: Grossly normal  Lab Results: Lab Results  Component Value Date   WBC 8.7 11/30/2023   HGB 11.5 (L) 11/30/2023   HCT 34.9 (L) 11/30/2023   MCV 95.4 11/30/2023   PLT 278 11/30/2023   Lab Results  Component Value Date   INR 1.1 08/11/2023   BMET Lab Results  Component Value Date   NA 135 11/30/2023   K 4.5 11/30/2023   CL 109 11/30/2023   CO2 20 (L) 11/30/2023   GLUCOSE 116 (H) 11/30/2023   BUN 22 11/30/2023   CREATININE 1.38 (H) 11/30/2023   CALCIUM  8.5 (L) 11/30/2023    Studies/Results: No results found.  Assessment/Plan: S/p lumbar wound washout for hematoma. Doing a lot better. Waiting for rehab placement. Ok to discharge when bed available   LOS: 4 days    Miguel Williams [facility] 12/05/2023, 8:47 AM

## 2023-12-06 DIAGNOSIS — M5416 Radiculopathy, lumbar region: Secondary | ICD-10-CM | POA: Diagnosis not present

## 2023-12-06 LAB — COMPREHENSIVE METABOLIC PANEL WITH GFR
ALT: 23 U/L (ref 0–44)
AST: 25 U/L (ref 15–41)
Albumin: 2.8 g/dL — ABNORMAL LOW (ref 3.5–5.0)
Alkaline Phosphatase: 110 U/L (ref 38–126)
Anion gap: 8 (ref 5–15)
BUN: 29 mg/dL — ABNORMAL HIGH (ref 8–23)
CO2: 23 mmol/L (ref 22–32)
Calcium: 9 mg/dL (ref 8.9–10.3)
Chloride: 109 mmol/L (ref 98–111)
Creatinine, Ser: 1.39 mg/dL — ABNORMAL HIGH (ref 0.61–1.24)
GFR, Estimated: 52 mL/min — ABNORMAL LOW (ref >60)
Glucose, Bld: 111 mg/dL — ABNORMAL HIGH (ref 70–99)
Potassium: 4.7 mmol/L (ref 3.5–5.1)
Sodium: 140 mmol/L (ref 135–145)
Total Bilirubin: 0.6 mg/dL (ref 0.0–1.2)
Total Protein: 5.2 g/dL — ABNORMAL LOW (ref 6.5–8.1)

## 2023-12-06 LAB — CBC WITH DIFFERENTIAL/PLATELET
Abs Immature Granulocytes: 0.06 K/uL (ref 0.00–0.07)
Basophils Absolute: 0.1 K/uL (ref 0.0–0.1)
Basophils Relative: 1 %
Eosinophils Absolute: 0.5 K/uL (ref 0.0–0.5)
Eosinophils Relative: 7 %
HCT: 32.2 % — ABNORMAL LOW (ref 39.0–52.0)
Hemoglobin: 10.5 g/dL — ABNORMAL LOW (ref 13.0–17.0)
Immature Granulocytes: 1 %
Lymphocytes Relative: 18 %
Lymphs Abs: 1.3 K/uL (ref 0.7–4.0)
MCH: 30.5 pg (ref 26.0–34.0)
MCHC: 32.6 g/dL (ref 30.0–36.0)
MCV: 93.6 fL (ref 80.0–100.0)
Monocytes Absolute: 0.8 K/uL (ref 0.1–1.0)
Monocytes Relative: 11 %
Neutro Abs: 4.6 K/uL (ref 1.7–7.7)
Neutrophils Relative %: 62 %
Platelets: 256 K/uL (ref 150–400)
RBC: 3.44 MIL/uL — ABNORMAL LOW (ref 4.22–5.81)
RDW: 13.5 % (ref 11.5–15.5)
WBC: 7.3 K/uL (ref 4.0–10.5)
nRBC: 0 % (ref 0.0–0.2)

## 2023-12-06 LAB — AEROBIC/ANAEROBIC CULTURE W GRAM STAIN (SURGICAL/DEEP WOUND)
Culture: NO GROWTH
Gram Stain: NONE SEEN

## 2023-12-06 MED ORDER — CETAPHIL MOISTURIZING EX LOTN
TOPICAL_LOTION | CUTANEOUS | Status: DC | PRN
  Filled 2023-12-06 (×3): qty 473

## 2023-12-06 NOTE — Progress Notes (Signed)
 Occupational Therapy Session Note  Patient Details  Name: Kebron Pulse MRN: [id number] Date of Birth: Jun 22, 1945  Today's Date: 12/06/2023 OT Individual Time: 1350-1430 OT Individual Time Calculation (min): 40 min    Short Term Goals: Week 1:  OT Short Term Goal 1 (Week 1): LTG=STG  Skilled Therapeutic Interventions/Progress Updates:  Pt greeted resting in bed for skilled OT session with focus on standing tolerance/balance and functional transfers/mobility.   Pain: Pt with un-rated back pain, OT offering intermediate rest breaks and positioning suggestions throughout session to address pain/fatigue and maximize participation/safety in session. LPN administers medication after session.   Functional Transfers: Sit<>stands and stand-pivots/ambulatory transfers with CGA + RW. Cues for safe hand placement. Mild impulsivity noted with transfers.   Self Care Tasks: Pt completes the following self care tasks with levels of assistance noted below, LB: Standing hike of LB garments with Min A + RW.   Therapeutic Activities: Pt instructed in standing balance/tolerance activity targeting unilateral support on RW with additional challenge of reaching outside BOS. Pt requires consistent cues for precaution adherence. Standing tolerance for ~ 2-3 mins before requiring seated rest-break.   Pt remained resting in bed with 4Ps assessed and immediate needs met. Pt continues to be appropriate for skilled OT intervention to promote further functional independence in ADLs/IADLs.   Therapy Documentation Precautions:  Precautions Precautions: Back, Fall Required Braces or Orthoses: Spinal Brace Spinal Brace: Lumbar corset, Applied in sitting position Restrictions Weight Bearing Restrictions Per Provider Order: No   Therapy/Group: Individual Therapy  Nereida Habermann, OTR/L, MSOT  12/06/2023, 2:05 PM

## 2023-12-06 NOTE — Plan of Care (Signed)
  Problem: RH Balance Goal: LTG Patient will maintain dynamic standing balance (PT) Description: LTG:  Patient will maintain dynamic standing balance with assistance during mobility activities (PT) Flowsheets (Taken 12/06/2023 1010) LTG: Pt will maintain dynamic standing balance during mobility activities with:: Supervision/Verbal cueing   Problem: Sit to Stand Goal: LTG:  Patient will perform sit to stand with assistance level (PT) Description: LTG:  Patient will perform sit to stand with assistance level (PT) Flowsheets (Taken 12/06/2023 1010) LTG: PT will perform sit to stand in preparation for functional mobility with assistance level: Supervision/Verbal cueing   Problem: RH Bed Mobility Goal: LTG Patient will perform bed mobility with assist (PT) Description: LTG: Patient will perform bed mobility with assistance, with/without cues (PT). Flowsheets (Taken 12/06/2023 1010) LTG: Pt will perform bed mobility with assistance level of: Supervision/Verbal cueing   Problem: RH Bed to Chair Transfers Goal: LTG Patient will perform bed/chair transfers w/assist (PT) Description: LTG: Patient will perform bed to chair transfers with assistance (PT). Flowsheets (Taken 12/06/2023 1010) LTG: Pt will perform Bed to Chair Transfers with assistance level: Supervision/Verbal cueing   Problem: RH Car Transfers Goal: LTG Patient will perform car transfers with assist (PT) Description: LTG: Patient will perform car transfers with assistance (PT). Flowsheets (Taken 12/06/2023 1010) LTG: Pt will perform car transfers with assist:: Contact Guard/Touching assist   Problem: RH Ambulation Goal: LTG Patient will ambulate in controlled environment (PT) Description: LTG: Patient will ambulate in a controlled environment, # of feet with assistance (PT). Flowsheets (Taken 12/06/2023 1010) LTG: Pt will ambulate in controlled environ  assist needed:: Supervision/Verbal cueing LTG: Ambulation distance in controlled  environment: 175ft Goal: LTG Patient will ambulate in home environment (PT) Description: LTG: Patient will ambulate in home environment, # of feet with assistance (PT). Flowsheets (Taken 12/06/2023 1010) LTG: Pt will ambulate in home environ  assist needed:: Supervision/Verbal cueing LTG: Ambulation distance in home environment: 18ft   Problem: RH Stairs Goal: LTG Patient will ambulate up and down stairs w/assist (PT) Description: LTG: Patient will ambulate up and down # of stairs with assistance (PT) Flowsheets (Taken 12/06/2023 1010) LTG: Pt will ambulate up/down stairs assist needed:: Contact Guard/Touching assist LTG: Pt will  ambulate up and down number of stairs: at least 2 with 2 hand rails

## 2023-12-06 NOTE — Progress Notes (Signed)
 PROGRESS NOTE   Subjective/Complaints:  No events overnight.  No acute complaints.  Feeling sore after therapies. Vitals stable     12/06/2023    4:58 AM 12/05/2023    8:59 PM 12/05/2023    6:40 PM  Vitals with BMI  Height   5' 8  Weight   194 lbs  BMI   29.51  Systolic 162 132   Diastolic 79 64   Pulse 89 100    Labs with mild BUN elevation; toehrwise stable P.o. intakes appropriate  Continent of bladder  LBM yesterday per patient, does not feel he is constipated.  ROS: Denies fevers, chills, N/V, abdominal pain, constipation, diarrhea, SOB, cough, chest pain, new weakness or paraesthesias.    Objective:   No results found. Recent Labs    12/06/23 0446  WBC 7.3  HGB 10.5*  HCT 32.2*  PLT 256   Recent Labs    12/06/23 0446  NA 140  K 4.7  CL 109  CO2 23  GLUCOSE 111*  BUN 29*  CREATININE 1.39*  CALCIUM  9.0    Intake/Output Summary (Last 24 hours) at 12/06/2023 0850 Last data filed at 12/06/2023 9277 Gross per 24 hour  Intake 120 ml  Output 800 ml  Net -680 ml        Physical Exam: Vital Signs Blood pressure (!) 162/79, pulse 89, temperature 98.4 F (36.9 C), temperature source Oral, resp. rate 18, height 5' 8 (1.727 m), weight 88 kg, SpO2 93%. Constitutional: No apparent distress. Appropriate appearance for age.  HENT: No JVD. Neck Supple. Trachea midline. Atraumatic, normocephalic. Eyes: PERRLA. EOMI. Visual fields grossly intact.  Cardiovascular: RRR, no murmurs/rub/gallops. No Edema. Peripheral pulses 2+  Respiratory: CTAB. No rales, rhonchi, or wheezing. On RA.  Abdomen: + bowel sounds, normoactive. No distention or tenderness.  Skin: C/D/I. No apparent lesions. MSK:      No apparent deformity.  Neuro: Alert and oriented x 3. Normal insight and awareness. Intact Memory. Normal language and speech. Cranial nerve exam unremarkable.   MMT:  BUE 4+/5 prox to distal.  RLE 3/5 HF, KE  and 4/5 ADF/PF.  LLE 4-/5 HF, KE and 4+ ADF/PF.   Pt with mild proprioceptive deficits in LE's.  DTR's hyperreflexic right lower extremity No abnl resting tone.    Bilateral upper and lower extremity fine, intention tremor   Assessment/Plan: 1. Functional deficits which require 3+ hours per day of interdisciplinary therapy in a comprehensive inpatient rehab setting. Physiatrist is providing close team supervision and 24 hour management of active medical problems listed below. Physiatrist and rehab team continue to assess barriers to discharge/monitor patient progress toward functional and medical goals  Care Tool:  Bathing              Bathing assist       Upper Body Dressing/Undressing Upper body dressing        Upper body assist      Lower Body Dressing/Undressing Lower body dressing            Lower body assist       Toileting Toileting    Toileting assist       Transfers Chair/bed transfer  Transfers assist           Locomotion Ambulation   Ambulation assist              Walk 10 feet activity   Assist           Walk 50 feet activity   Assist           Walk 150 feet activity   Assist           Walk 10 feet on uneven surface  activity   Assist           Wheelchair     Assist               Wheelchair 50 feet with 2 turns activity    Assist            Wheelchair 150 feet activity     Assist          Blood pressure (!) 162/79, pulse 89, temperature 98.4 F (36.9 C), temperature source Oral, resp. rate 18, height 5' 8 (1.727 m), weight 88 kg, SpO2 93%.  1. 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              -patient may shower if wound is covered             -ELOS/Goals: 11-13 days, mod I to supervision goals  - Stable to continue inpatient  rehab  2.  Antithrombotics: -DVT/anticoagulation:  Mechanical: Sequential compression devices, below knee Bilateral lower extremities             -antiplatelet therapy: N/A  3. Pain Management: Tylenol  prn mild pain or oxycodone  prn severe pain             -encourage pre-treatment with oxycodone  prior to therapies for better activity tolerance--tolerating today   - 8/13: Hx alcohol  abuse, severe neuropathic pain overlying radicular pain - will get B12, folate, B6 levels with next labs  4. Mood/Behavior/Sleep: LCSW to follow for evaluation and support.              -antipsychotic agents: N/A  5. Neuropsych/cognition/Hx Wernicke's encephalopathy: This patient is capable of making decisions on his own behalf.   - Reviewed notes from [facility] neurology 10-2023; does not meet criteria for idiopathic Parkinson's at this time, but is undergoing current evaluation.  Has history of Wernicke's encephalopathy and was continued to drink at time of last exam.  - On thiamine , B12 supplement.   6. Skin/Wound Care: Monitor incision for healing.  -dressing changes as needed. -Routine pressure relief measures.   7. Fluids/Electrolytes/Nutrition: Monitor I/O. Check CMET in am.    - 8-13: AKI as below, otherwise stable.  8.  HTN: Monitor BP TID--on Amlodipine  and Avapro .   - Blood pressure stable on current regimen    12/06/2023    7:40 PM 12/06/2023   12:43 PM 12/06/2023    4:58 AM  Vitals with BMI  Systolic 132 128 837  Diastolic 69 70 79  Pulse 88 92 89    9.  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 on 08/07-->recheck labs in am  - 8-13: Stable creatinine, increased BUN on a.m. labs; encourage p.o. fluids, recheck in 2 days  11.  Anemia: Hgb 11.5 on 08/07--> recheck cbc in am.  Of note, has needed multiple blood transfusions  s/p fall May of this year.  - 8-13: 10.5, within limits of variability.  Endorses no bleeding.  Trend,  get iron studies, vitamin C, folate, B12 with next labs.   9.  H/o Gout: On colchicine .  10. Hypothyroid: On synthroid  for supplement.  11. Anxiety/alcohol  abuse: continue Klonopin  at bedtime prn. - Provide education  12. Hx bladder cancer: Treated with TURBT/infusion by Dr. Devere             -see #9             -monitor voiding patterns.             -I/O caths prn  13. Neurogenic bowel/constipation             -no bm since 8/7             -sorbitol  60cc tonight. If no bm, more aggressive approach tomorrow   8-13: Large bowel movement this p.m.--schedule Senokot S1 tab twice daily  LOS: 1 days A FACE TO FACE EVALUATION WAS PERFORMED  Miguel Williams Likes 12/06/2023, 8:50 AM

## 2023-12-06 NOTE — Anesthesia Postprocedure Evaluation (Signed)
 Anesthesia Post Note  Patient: Miguel Williams  Procedure(s) Performed: LUMBAR WOUND DEBRIDEMENT     Patient location during evaluation: PACU Anesthesia Type: General Level of consciousness: awake and alert Pain management: pain level controlled Vital Signs Assessment: post-procedure vital signs reviewed and stable Respiratory status: spontaneous breathing, nonlabored ventilation and respiratory function stable Cardiovascular status: blood pressure returned to baseline and stable Postop Assessment: no apparent nausea or vomiting Anesthetic complications: no   No notable events documented.              Davine Sweney

## 2023-12-06 NOTE — Progress Notes (Signed)
 Pt given 60cc of sorbitol  and has had no BM.

## 2023-12-06 NOTE — Plan of Care (Signed)
  Problem: RH Balance Goal: LTG Patient will maintain dynamic standing with ADLs (OT) Description: LTG:  Patient will maintain dynamic standing balance with assist during activities of daily living (OT)  Flowsheets (Taken 12/06/2023 1424) LTG: Pt will maintain dynamic standing balance during ADLs with: Independent with assistive device   Problem: Sit to Stand Goal: LTG:  Patient will perform sit to stand in prep for activites of daily living with assistance level (OT) Description: LTG:  Patient will perform sit to stand in prep for activites of daily living with assistance level (OT) Flowsheets (Taken 12/06/2023 1424) LTG: PT will perform sit to stand in prep for activites of daily living with assistance level: Independent with assistive device   Problem: RH Bathing Goal: LTG Patient will bathe all body parts with assist levels (OT) Description: LTG: Patient will bathe all body parts with assist levels (OT) Flowsheets (Taken 12/06/2023 1424) LTG: Pt will perform bathing with assistance level/cueing: Independent with assistive device    Problem: RH Dressing Goal: LTG Patient will perform upper body dressing (OT) Description: LTG Patient will perform upper body dressing with assist, with/without cues (OT). Flowsheets (Taken 12/06/2023 1424) LTG: Pt will perform upper body dressing with assistance level of: Independent with assistive device Goal: LTG Patient will perform lower body dressing w/assist (OT) Description: LTG: Patient will perform lower body dressing with assist, with/without cues in positioning using equipment (OT) Flowsheets (Taken 12/06/2023 1424) LTG: Pt will perform lower body dressing with assistance level of: Independent with assistive device   Problem: RH Toileting Goal: LTG Patient will perform toileting task (3/3 steps) with assistance level (OT) Description: LTG: Patient will perform toileting task (3/3 steps) with assistance level (OT)  Flowsheets (Taken 12/06/2023  1424) LTG: Pt will perform toileting task (3/3 steps) with assistance level: Independent with assistive device   Problem: RH Simple Meal Prep Goal: LTG Patient will perform simple meal prep w/assist (OT) Description: LTG: Patient will perform simple meal prep with assistance, with/without cues (OT). Flowsheets (Taken 12/06/2023 1424) LTG: Pt will perform simple meal prep with assistance level of: Supervision/Verbal cueing   Problem: RH Toilet Transfers Goal: LTG Patient will perform toilet transfers w/assist (OT) Description: LTG: Patient will perform toilet transfers with assist, with/without cues using equipment (OT) Flowsheets (Taken 12/06/2023 1424) LTG: Pt will perform toilet transfers with assistance level of: Independent with assistive device   Problem: RH Tub/Shower Transfers Goal: LTG Patient will perform tub/shower transfers w/assist (OT) Description: LTG: Patient will perform tub/shower transfers with assist, with/without cues using equipment (OT) Flowsheets (Taken 12/06/2023 1424) LTG: Pt will perform tub/shower stall transfers with assistance level of: Independent with assistive device

## 2023-12-06 NOTE — Progress Notes (Signed)
 Inpatient Rehabilitation Admission Medication Review by a Pharmacist  A complete drug regimen review was completed for this patient to identify any potential clinically significant medication issues.  High Risk Drug Classes Is patient taking? Indication by Medication  Antipsychotic Yes, as an intravenous medication Compazine  - nausea  Anticoagulant No   Antibiotic No   Opioid Yes Oxycodone  - pain   Antiplatelet No   Hypoglycemics/insulin No   Vasoactive Medication Yes Irbesartan  - HTN Flomax  - BPH  Chemotherapy No   Other No Acetaminophen - pain  Atorvastatin , Zetia  - HLD Colchicine  - gout Clonazepam  - sleep/anxiety Cyclobenzaprine  - muscle spasms Cymbalta - depression Docusate, sorbitol  -bowel regimen, constipation Dutasteride - BPH Ferrous sulfate - iron supplement Levthyroxine -hypothyrodism Melatonin - sleep Protonix  - GERD Thiamine , Vitamin B12 - supplements     Type of Medication Issue Identified Description of Issue Recommendation(s)  Drug Interaction(s) (clinically significant)     Duplicate Therapy     Allergy     No Medication Administration End Date     Incorrect Dose     Additional Drug Therapy Needed     Significant med changes from prior encounter (inform family/care partners about these prior to discharge). PTA medications:  Valsartan - substituting Irbesartan  Prevacid = substituting Protonix  Tramadol  was discontinued on [facility] acute encounter/discharge. May switch back to Valsartan and prevacid on discharge from CIR.   Communicate to patient /family/ caregiver prior to discharge.   Other       Clinically significant medication issues were identified that warrant physician communication and completion of prescribed/recommended actions by midnight of the next day:  No  Name of provider notified for urgent issues identified:   Provider Method of Notification:     Pharmacist comments:   Time spent performing this drug regimen review (minutes):   25     Levorn Gaskins, RPh Clinical Pharmacist 12/06/2023 3:31 PM

## 2023-12-06 NOTE — Progress Notes (Signed)
 Inpatient Rehabilitation  Patient information reviewed and entered into eRehab system by [facility]. Karen Kays., CCC/SLP, PPS Coordinator.  Information including medical coding, functional ability and quality indicators will be reviewed and updated through discharge.

## 2023-12-06 NOTE — Evaluation (Signed)
 Occupational Therapy Assessment and Plan  Patient Details  Name: Miguel Williams MRN: [id number] Date of Birth: [birth year]  OT Diagnosis: acute pain and muscle weakness (generalized) Rehab Potential: Rehab Potential (ACUTE ONLY): Poor ELOS: ~ 7 days   Today's Date: 12/06/2023 OT Individual Time: 8954-8854 OT Individual Time Calculation (min): 60 min     Hospital Problem: Principal Problem:   Neurogenic claudication due to lumbar spinal stenosis   Past Medical History:  Past Medical History:  Diagnosis Date   Abdominal pain, lower    Agitation    Allergy, unspecified, initial encounter    Anxiety disorder    Arthritis    Asthma    As a child   Bipolar disorder (HCC)    Patient denies   Cancer (HCC)    Bladder   Cancer (HCC)    Skin cancer   Chronic kidney disease, stage III (moderate) (HCC)    Chronic kidney disease, stage III (moderate) (HCC)    COPD (chronic obstructive pulmonary disease) (HCC)    Pt denies (10/2023)   Coronary atherosclerosis of native coronary artery    Esophageal reflux    Fatigue    Fungal granuloma    Hip pain    History of 2019 novel coronavirus disease (COVID-19)    History of endoscopy 02/00/2011   History of kidney stones    Hypertension    Hypo-osmolality and hyponatremia    Hypokalemia    Hypothyroidism    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 now has 3oz of wine every other day (as of 10/2023)   Umbilical hernia    Unspecified fall, subsequent encounter     Vitamin D deficiency, unspecified    Wernicke's encephalopathy 06/23/2019   Past Surgical History:  Past Surgical History:  Procedure Laterality Date   CATARACT EXTRACTION W/ INTRAOCULAR LENS IMPLANT Bilateral    COLONOSCOPY  04/2023   COLONOSCOPY W/ ENDOSCOPIC US      CYSTOSCOPY WITH FULGERATION N/A 03/09/2023   Procedure: CYSTOSCOPY WITH FULGERATION WITH CLOT EVACUATION;  Surgeon: Devere Lonni Righter, MD;  Location: [facility];  Service: Urology;  Laterality: N/A;   CYSTOSCOPY WITH URETHRAL DILATATION N/A 03/08/2023   Procedure: CYSTOSCOPY;  Surgeon: Devere Lonni Righter, MD;  Location: [facility];  Service: Urology;  Laterality: N/A;  30 MINUTES   ESOPHAGOGASTRODUODENOSCOPY     LUMBAR WOUND DEBRIDEMENT N/A 12/01/2023   Procedure: LUMBAR WOUND DEBRIDEMENT;  Surgeon: [name], MD;  Location: [facility];  Service: Neurosurgery;  Laterality: N/A;   TONSILLECTOMY  04/25/1951    Assessment & Plan Clinical Impression: Patient is a 78 y.o. year old male whistory of  bladder CA, CKD III, idiopathic aseptic  necrosis bilateral femurs, anxiety d/o. 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 He was discharged to home  07/24 but readmitted on 11/30/23  with reports of ferquent fall with BLE weakness and  progressive back and leg pain for a few days. pain. MRI spine done revealing large 6.8 cm posterior paraspinal fluid collection extending into dorsal canal with anterior displacement of cauda equina nerve roots and severe L4/5 and moderate L5-S1 canal stenosis.  He underwent re-exploration of lumbar wound with evacuation of old blood with seroma and placement of JP drain on 08/08 by Dr. [name]. [name] removed today and IV antibiotics d/c   He continues to be limited by RLE>LLE weakness with decreased posture, right knee instability,   dizziness, unsteady gait with multimodal cues and poor carryover.  He requires min to +2 min assist with mobility and ,om assist with ADLs. He was independent prior to surgery and CIR recommended due to functional decline.  Patient transferred to CIR on 12/05/2023 .    Patient currently requires mod with basic self-care skills and min A with basic mobility with cues for maintaining back precautions secondary to muscle weakness, decreased cardiorespiratoy endurance, and decreased standing balance, decreased postural control, decreased balance strategies, and difficulty maintaining precautions.  Prior to hospitalization, patient could complete ADL with min.  Patient will benefit from skilled intervention to decrease level of assist with basic self-care skills and increase independence with basic self-care skills prior to discharge home with care partner.  Anticipate patient will require intermittent supervision and follow up home health.  OT - End of Session Activity Tolerance: Improving Endurance Deficit: Yes Endurance Deficit Description: seated rest break needed b/w functional mobility tasks OT Assessment Rehab Potential (ACUTE ONLY): Poor OT Barriers to Discharge: None OT Patient demonstrates impairments in the following area(s): Balance;Sensory;Edema;Endurance;Motor;Pain OT Basic ADL's Functional Problem(s): Bathing;Dressing;Toileting OT Advanced ADL's Functional Problem(s): None OT Transfers Functional Problem(s): Toilet OT Additional Impairment(s): None OT Plan OT Intensity: Minimum of 1-2 x/day, 45 to 90 minutes OT Frequency: 5 out of 7 days OT Duration/Estimated Length of Stay: ~ 7 days OT Treatment/Interventions: Balance/vestibular training;Disease mangement/prevention;Neuromuscular re-education;Self Care/advanced ADL retraining;DME/adaptive equipment instruction;Pain management;Skin care/wound managment;Community reintegration;Patient/family education;Discharge planning;Functional  mobility training;Psychosocial support;Therapeutic Activities;Splinting/orthotics;UE/LE Coordination activities;UE/LE Strength taining/ROM OT Self Feeding Anticipated Outcome(s): n/a OT Basic Self-Care Anticipated Outcome(s): mod I OT Toileting Anticipated Outcome(s): mod I OT Bathroom Transfers Anticipated Outcome(s): mod I OT Recommendation Recommendations for Other Services: None Patient destination: Home Follow Up Recommendations: None Equipment Recommended: To be determined   OT Evaluation Precautions/Restrictions  Precautions Precautions: Back;Fall Required Braces or Orthoses: Spinal Brace Spinal Brace: Lumbar corset;Applied in sitting position Restrictions Weight Bearing Restrictions Per Provider Order: No General Chart Reviewed: Yes Family/Caregiver Present: No    Pain Pain Assessment Pain Scale: 0-10 Pain Score: 0-No pain Pain Location: Back Pain Intervention(s): Medication (See eMAR) reports sciatica pain- provided education on stretches in supine in bed and rest when needed Home Living/Prior Functioning Home Living Available Help at Discharge: Home health, Available 24 hours/day, Personal care attendant (for more 2 more weeks with personal care attendant) Type of Home: House Home Access: Stairs to enter [employer] of Steps: 2 Entrance Stairs-Rails: Left, Right, Can reach both Home Layout: Able to live on main level with bedroom/bathroom, Two level Bathroom Shower/Tub: Health visitor: Standard Bathroom Accessibility: Yes Additional Comments: Has had personal care attendent since 07/2023, 24/7 care (lives on 2nd floor of his home). Name is [name]. [name] helps with driving, medication management, light housekeeping, laundry, dressing at times. Uses [employer] for meals,  Lives With: Alone Prior Function Level of Independence: Independent with gait, Independent with transfers, Needs assistance  with homemaking, Needs assistance with ADLs   Able to Take Stairs?: Yes Driving: No Vocation: Retired Administrator, sports Baseline Vision/History: 0 No visual deficits;4 Cataracts Ability to See in Adequate Light: 0 Adequate Patient Visual Report: No change from baseline Vision Assessment?: No apparent visual deficits Perception  Perception: Within Functional Limits Praxis Praxis: WFL Cognition Cognition Overall Cognitive Status: Within Functional Limits for tasks assessed Arousal/Alertness: Awake/alert Orientation Level: Person;Place;Situation Person: Oriented Place: Oriented Situation: Oriented Memory: Appears intact Attention: Sustained;Selective Sustained Attention: Appears intact Selective Attention: Appears intact Awareness: Appears intact Problem Solving: Appears intact Safety/Judgment: Appears intact Brief Interview for Mental Status (BIMS) Repetition of Three Words (First Attempt): 3 Temporal Orientation: Year: Correct Temporal Orientation: Month: Accurate within 5 days Temporal Orientation: Day: Incorrect Recall: Sock: No, could not recall Recall: Blue: Yes, no cue required Recall: Bed: Yes, no cue required BIMS Summary Score: 12 Sensation Sensation Light Touch: Appears Intact Hot/Cold: Appears Intact Proprioception: Appears Intact Stereognosis: Appears Intact Coordination Gross Motor Movements are Fluid and Coordinated: No Fine Motor Movements are Fluid and Coordinated: Yes Coordination and Movement Description: Generalized weakness and deconditioning Motor  Motor Motor: Other (comment) Motor - Skilled Clinical Observations: Generalized deconditioning  Trunk/Postural Assessment  Cervical Assessment Cervical Assessment: Within Functional Limits Thoracic Assessment Thoracic Assessment: Exceptions to [facility] Lumbar Assessment Lumbar Assessment: Exceptions to [facility] Postural Control Postural Control: Within Functional Limits  Balance Balance Balance Assessed: Yes Standardized Balance  Assessment Standardized Balance Assessment: Timed Up and Go Test Timed Up and Go Test TUG: Normal TUG Normal TUG (seconds): 25 (with RW) Static Sitting Balance Static Sitting - Balance Support: Feet supported;No upper extremity supported Static Sitting - Level of Assistance: 5: Stand by assistance Dynamic Sitting Balance Dynamic Sitting - Balance Support: Feet supported;No upper extremity supported Dynamic Sitting - Level of Assistance: 4: Min Oncologist Standing - Balance Support: Bilateral upper extremity supported Static Standing - Level of Assistance: Other (comment) (CGA) Dynamic Standing Balance Dynamic Standing - Balance Support: Bilateral upper extremity supported;During functional activity Dynamic Standing - Level of Assistance: 4: Min assist Extremity/Trunk Assessment RUE Assessment RUE Assessment: Within Functional Limits LUE Assessment LUE Assessment: Within Functional Limits  Care Tool Care Tool Self Care Eating   Eating Assist Level: Set up assist    Oral Care    Oral Care Assist Level: Set up assist    Bathing   Body parts bathed by patient: Right arm;Left arm;Chest;Abdomen;Front perineal area;Right upper leg;Left upper leg;Face Body parts bathed by helper: Right lower leg;Left lower leg;Buttocks   Assist Level: Minimal Assistance - Patient > 75%    Upper Body Dressing(including orthotics)   What is the patient wearing?: Pull over shirt;Orthosis   Assist Level: Moderate Assistance - Patient 50 - 74%    Lower Body Dressing (excluding footwear)   What is the patient wearing?: Underwear/pull up;Pants Assist for lower body dressing: Moderate Assistance - Patient 50 - 74%    Putting on/Taking off footwear   What is the patient wearing?: Socks;Shoes Assist for footwear: Maximal Assistance - Patient 25 - 49%       Care Tool Toileting Toileting activity   Assist for toileting: Minimal Assistance - Patient > 75%     Care Tool  Bed Mobility Roll left and right activity   Roll left and right assist level: Contact Guard/Touching assist    Sit to lying activity   Sit to lying assist level: Minimal Assistance - Patient > 75%    Lying to sitting on side  of bed activity   Lying to sitting on side of bed assist level: the ability to move from lying on the back to sitting on the side of the bed with no back support.: Minimal Assistance - Patient > 75%     Care Tool Transfers Sit to stand transfer   Sit to stand assist level: Minimal Assistance - Patient > 75%    Chair/bed transfer   Chair/bed transfer assist level: Minimal Assistance - Patient > 75%     Toilet transfer   Assist Level: Minimal Assistance - Patient > 75%     Care Tool Cognition  Expression of Ideas and Wants    Understanding Verbal and Non-Verbal Content Understanding Verbal and Non-Verbal Content: 4. Understands (complex and basic) - clear comprehension without cues or repetitions   Memory/Recall Ability     Refer to Care Plan for Long Term Goals  SHORT TERM GOAL WEEK 1 OT Short Term Goal 1 (Week 1): LTG=STG  Recommendations for other services: None    Skilled Therapeutic Intervention  1:1 Ot eval initiated with OT purpose, role and goals discussed. Pt education and practice with functional transitional movements with cues for back precautions and proper hand placement. Demonstrated and showed stretches for sciatica pain  in the bed. Pt came to EOB with max instructional cues. Max A to don back brace properly. Pt ambulated with RW to the bathroom and transitioned into hte shower stall. Pt sat on wide BSC for cut out to be able to cleanse periarea and buttocks while sitting down to maintain back precautions (only standing with brace donned). Pt's back was covered.   Pt sat in w/c for dressing. Pt able to thread pants and underwear with min A without use of reacher maintaining precautions. Pt used sock aide with instructions to don socks and  total A for donning shoes. Discussed d/c plans and goals of being mod I at home.    ADL ADL Eating: Independent Grooming: Setup Upper Body Bathing: Contact guard Where Assessed-Upper Body Bathing: Shower Lower Body Bathing: Moderate assistance Where Assessed-Lower Body Bathing: Shower Where Assessed-Upper Body Dressing: Edge of bed Lower Body Dressing: Minimal assistance Where Assessed-Lower Body Dressing: Wheelchair Toileting: Moderate assistance Where Assessed-Toileting: Teacher, adult education: Independent Mobility  Bed Mobility Bed Mobility: Rolling Right;Rolling Left;Sit to Supine;Supine to Sit Rolling Right: Contact Guard/Touching assist Rolling Left: Contact Guard/Touching assist Supine to Sit: Minimal Assistance - Patient > 75% Sit to Supine: Minimal Assistance - Patient > 75% Transfers Sit to Stand: Minimal Assistance - Patient > 75% Stand to Sit: Minimal Assistance - Patient > 75%   Discharge Criteria: Patient will be discharged from OT if patient refuses treatment 3 consecutive times without medical reason, if treatment goals not met, if there is a change in medical status, if patient makes no progress towards goals or if patient is discharged from hospital.  The above assessment, treatment plan, treatment alternatives and goals were discussed and mutually agreed upon: by patient  Claudene Delon Levy 12/06/2023, 11:55 AM

## 2023-12-06 NOTE — Evaluation (Signed)
 Physical Therapy Assessment and Plan  Patient Details  Name: Miguel Williams MRN: [id number] Date of Birth: 02-04-1946  PT Diagnosis: Abnormal posture, Abnormality of gait, Difficulty walking, Low back pain, and Muscle weakness Rehab Potential: Good ELOS: 7-9 days   Today's Date: 12/06/2023 PT Individual Time: 0900-1014 PT Individual Time Calculation (min): 74 min    Hospital Problem: Principal Problem:   Neurogenic claudication due to lumbar spinal stenosis   Past Medical History:  Past Medical History:  Diagnosis Date   Abdominal pain, lower    Agitation    Allergy, unspecified, initial encounter    Anxiety disorder    Arthritis    Asthma    As a child   Bipolar disorder (HCC)    Patient denies   Cancer (HCC)    Bladder   Cancer (HCC)    Skin cancer   Chronic kidney disease, stage III (moderate) (HCC)    Chronic kidney disease, stage III (moderate) (HCC)    COPD (chronic obstructive pulmonary disease) (HCC)    Pt denies (10/2023)   Coronary atherosclerosis of native coronary artery    Esophageal reflux    Fatigue    Fungal granuloma    Hip pain    History of 2019 novel coronavirus disease (COVID-19)    History of endoscopy 02/00/2011   History of kidney stones    Hypertension    Hypo-osmolality and hyponatremia    Hypokalemia    Hypothyroidism    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 now has 3oz of wine every other day (as of 10/2023)   Umbilical hernia    Unspecified fall, subsequent  encounter    Vitamin D deficiency, unspecified    Wernicke's encephalopathy 06/23/2019   Past Surgical History:  Past Surgical History:  Procedure Laterality Date   CATARACT EXTRACTION W/ INTRAOCULAR LENS IMPLANT Bilateral    COLONOSCOPY  04/2023   COLONOSCOPY W/ ENDOSCOPIC US      CYSTOSCOPY WITH FULGERATION N/A 03/09/2023   Procedure: CYSTOSCOPY WITH FULGERATION WITH CLOT EVACUATION;  Surgeon: Devere Lonni Righter, MD;  Location: [facility];  Service: Urology;  Laterality: N/A;   CYSTOSCOPY WITH URETHRAL DILATATION N/A 03/08/2023   Procedure: CYSTOSCOPY;  Surgeon: Devere Lonni Righter, MD;  Location: [facility];  Service: Urology;  Laterality: N/A;  30 MINUTES   ESOPHAGOGASTRODUODENOSCOPY     LUMBAR WOUND DEBRIDEMENT N/A 12/01/2023   Procedure: LUMBAR WOUND DEBRIDEMENT;  Surgeon: [name], MD;  Location[facility];  Service: Neurosurgery;  Laterality: N/A;   TONSILLECTOMY  04/25/1951    Assessment & Plan Clinical Impression: Patient is a  78 year old male with history of  bladder CA, CKD III, idiopathic aseptic  necrosis bilateral femurs, anxiety d/o. 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 He was discharged to home  07/24 but readmitted  on 11/30/23 with reports of ferquent fall with BLE weakness and  progressive back and leg pain for a few days. pain. MRI spine done revealing large 6.8 cm posterior paraspinal fluid collection extending into dorsal canal with anterior displacement of cauda equina nerve roots and severe L4/5 and moderate L5-S1 canal stenosis.  He underwent re-exploration of lumbar wound with evacuation of old blood with seroma and placement of JP drain on 08/08 by Dr. [name]. [name] removed today and IV antibiotics d/c   He continues to be limited by RLE>LLE weakness with decreased posture, right knee  instability,  dizziness, unsteady gait with multimodal cues and poor carryover.  He requires min to +2 min assist with mobility and ,om assist with ADLs. He was independent prior to surgery and CIR recommended due to functional decline.  Patient transferred to CIR on 12/05/2023 .   Patient currently requires min with mobility secondary to muscle weakness and muscle joint tightness, decreased cardiorespiratoy endurance, and decreased sitting balance, decreased standing balance, and decreased balance strategies.  Prior to hospitalization, patient was modified independent  with mobility and lived with Alone in a House home.  Home access is 2Stairs to enter.  Patient will benefit from skilled PT intervention to maximize safe functional mobility, minimize fall risk, and decrease caregiver burden for planned discharge home with intermittent assist.  Anticipate patient will benefit from follow up OP at discharge.  PT - End of Session Activity Tolerance: Tolerates < 10 min activity, no significant change in vital signs Endurance Deficit: Yes Endurance Deficit Description: seated rest break needed b/w functional mobility tasks PT Assessment Rehab Potential (ACUTE/IP ONLY): Good PT Barriers to Discharge: Home environment access/layout;Lack of/limited family support;Insurance for SNF coverage PT Patient demonstrates impairments in the following area(s): Balance;Endurance;Motor;Pain;Safety;Skin Integrity PT Transfers Functional Problem(s): Bed Mobility;Bed to Chair;Car PT Locomotion Functional Problem(s): Ambulation;Stairs PT Plan PT Intensity: Minimum of 1-2 x/day ,45 to 90 minutes PT Frequency: 5 out of 7 days PT Duration Estimated Length of Stay: 7-9 days PT Treatment/Interventions: Ambulation/gait training;Discharge planning;Psychosocial support;Functional mobility training;Therapeutic Activities;Wheelchair propulsion/positioning;Therapeutic Exercise;Skin care/wound management;Neuromuscular  re-education;Disease management/prevention;Balance/vestibular training;Cognitive remediation/compensation;DME/adaptive equipment instruction;Pain management;Splinting/orthotics;UE/LE Coordination activities;UE/LE Strength taining/ROM;Stair training;Patient/family education;Functional electrical stimulation;Community reintegration PT Transfers Anticipated Outcome(s): supervision PT Locomotion Anticipated Outcome(s): supervision PT Recommendation Recommendations for Other Services: Neuropsych consult Follow Up Recommendations: 24 hour supervision/assistance;Outpatient PT Patient destination: Home Equipment Recommended: To be determined   PT Evaluation Precautions/Restrictions Precautions Precautions: Back;Fall Required Braces or Orthoses: Spinal Brace Spinal Brace: Lumbar corset;Applied in sitting position Restrictions Weight Bearing Restrictions Per Provider Order: No Pain Interference Pain Interference Pain Effect on Sleep: 3. Frequently Pain Interference with Therapy Activities: 2. Occasionally Pain Interference with Day-to-Day Activities: 3. Frequently Home Living/Prior Functioning Home Living Available Help at Discharge: Home health;Available 24 hours/day;Personal care attendant Type of Home: House Home Access: Stairs to enter [employer] of Steps: 2 Entrance Stairs-Rails: Left;Right;Can reach both Home Layout: Able to live on main level with bedroom/bathroom;Two level Bathroom Shower/Tub: Health visitor: Standard Bathroom Accessibility: Yes Additional Comments: Has had personal care attendent since 07/2023, 24/7 care (lives on 2nd floor of his home). Name is [name]. [name] helps with driving, medication management, light housekeeping, laundry, dressing at times. Uses [employer] for meals,  Lives With: Alone Prior Function Level of Independence: Independent with gait;Independent with transfers;Needs assistance with homemaking;Needs assistance with ADLs   Able to Take Stairs?: Yes Driving: No Vocation: Retired Vision/Perception  Vision - History Ability to See in Adequate Light: 0 Adequate Perception Perception: Within Functional  Limits Praxis Praxis: WFL  Cognition Overall Cognitive Status: Within Functional Limits for tasks assessed Arousal/Alertness: Awake/alert Orientation Level: Oriented X4 Memory: Appears intact Awareness: Appears intact Problem Solving: Appears intact Safety/Judgment: Appears intact Sensation Sensation Light Touch: Appears Intact Hot/Cold: Appears Intact Proprioception: Appears Intact Stereognosis: Appears Intact Coordination Gross Motor Movements are Fluid and Coordinated: No Coordination and Movement Description: Generalized weakness and deconditioning Motor  Motor Motor: Other (comment) Motor - Skilled Clinical Observations: Generalized deconditioning   Trunk/Postural Assessment  Cervical Assessment Cervical Assessment: Within Functional Limits Thoracic Assessment Thoracic Assessment: Exceptions to [facility] (rounded shoulders) Lumbar Assessment Lumbar Assessment: Exceptions to [facility] (posterior tilt) Postural Control Postural Control: Within Functional Limits  Balance Balance Balance Assessed: Yes Standardized Balance Assessment Standardized Balance Assessment: Timed Up and Go Test Timed Up and Go Test TUG: Normal TUG Normal TUG (seconds): 25 (with RW) Static Sitting Balance Static Sitting - Balance Support: Feet supported;No upper extremity supported Static Sitting - Level of Assistance: 5: Stand by assistance Dynamic Sitting Balance Dynamic Sitting - Balance Support: Feet supported;No upper extremity supported Dynamic Sitting - Level of Assistance: 4: Min Oncologist Standing - Balance Support: Bilateral upper extremity supported Static Standing - Level of Assistance: Other (comment) (CGA) Dynamic Standing Balance Dynamic Standing - Balance Support: Bilateral  upper extremity supported;During functional activity Dynamic Standing - Level of Assistance: 4: Min assist Extremity Assessment      RLE Assessment RLE Assessment: Exceptions to [facility] General Strength Comments: Grossly 4/5 LLE Assessment LLE Assessment: Exceptions to [facility] General Strength Comments: Grossly 4+/5  Care Tool Care Tool Bed Mobility Roll left and right activity   Roll left and right assist level: Contact Guard/Touching assist    Sit to lying activity   Sit to lying assist level: Minimal Assistance - Patient > 75%    Lying to sitting on side of bed activity   Lying to sitting on side of bed assist level: the ability to move from lying on the back to sitting on the side of the bed with no back support.: Minimal Assistance - Patient > 75%     Care Tool Transfers Sit to stand transfer   Sit to stand assist level: Minimal Assistance - Patient > 75%    Chair/bed transfer   Chair/bed transfer assist level: Minimal Assistance - Patient > 75%    Car transfer   Car transfer assist level: Minimal Assistance - Patient > 75%      Care Tool Locomotion Ambulation   Assist level: Minimal Assistance - Patient > 75% Assistive device: Walker-rolling Max distance: 125'  Walk 10 feet activity   Assist level: Minimal Assistance - Patient > 75% Assistive device: Walker-rolling   Walk 50 feet with 2 turns activity   Assist level: Minimal Assistance - Patient > 75% Assistive device: Walker-rolling  Walk 150 feet activity Walk 150 feet activity did not occur: Safety/medical concerns (fatigue)      Walk 10 feet on uneven surfaces activity Walk 10 feet on uneven surfaces activity did not occur: Safety/medical concerns      Stairs   Assist level: Minimal Assistance - Patient > 75% Stairs assistive device: 2 hand rails Max number of stairs: 4  Walk up/down 1 step activity   Walk up/down 1 step (curb) assist level: Minimal Assistance - Patient > 75% Walk up/down 1 step or curb  assistive device: 2 hand rails  Walk up/down 4 steps activity   Walk up/down 4 steps assist level: Minimal Assistance - Patient >  75% Walk up/down 4 steps assistive device: 2 hand rails  Walk up/down 12 steps activity   Walk up/down 12 steps assist level: Minimal Assistance - Patient > 75% Walk up/down 12 steps assistive device: 2 hand rails  Pick up small objects from floor Pick up small object from the floor (from standing position) activity did not occur: Safety/medical concerns      Wheelchair Is the patient using a wheelchair?: Yes Type of Wheelchair: Manual   Wheelchair assist level: Supervision/Verbal cueing Max wheelchair distance: 150'  Wheel 50 feet with 2 turns activity   Assist Level: Supervision/Verbal cueing  Wheel 150 feet activity   Assist Level: Supervision/Verbal cueing    Refer to Care Plan for Long Term Goals  SHORT TERM GOAL WEEK 1    Recommendations for other services: Neuropsych  Skilled Therapeutic Intervention Mobility Bed Mobility Bed Mobility: Rolling Right;Rolling Left;Sit to Supine;Supine to Sit Rolling Right: Contact Guard/Touching assist Rolling Left: Contact Guard/Touching assist Supine to Sit: Minimal Assistance - Patient > 75% Sit to Supine: Minimal Assistance - Patient > 75% Transfers Transfers: Sit to Stand;Stand to Sit;Stand Pivot Transfers Sit to Stand: Minimal Assistance - Patient > 75% Stand to Sit: Minimal Assistance - Patient > 75% Stand Pivot Transfers: Minimal Assistance - Patient > 75% Stand Pivot Transfer Details: Verbal cues for technique;Verbal cues for precautions/safety;Verbal cues for safe use of DME/AE;Tactile cues for posture Transfer (Assistive device): Rolling walker Locomotion  Gait Ambulation: Yes Gait Assistance: Minimal Assistance - Patient > 75% Gait Distance (Feet): 125 Feet Assistive device: Rolling walker Gait Assistance Details: Verbal cues for sequencing;Verbal cues for precautions/safety;Verbal cues for  safe use of DME/AE;Verbal cues for gait pattern;Tactile cues for initiation;Tactile cues for posture Gait Gait: Yes Gait Pattern: Impaired Gait Pattern: Step-through pattern;Trunk flexed;Right flexed knee in stance;Left flexed knee in stance;Decreased stride length;Decreased step length - left;Decreased step length - right Stairs / Additional Locomotion Stairs: Yes Stairs Assistance: Minimal Assistance - Patient > 75% Stair Management Technique: Two rails;Alternating pattern;Forwards Number of Stairs: 4 Height of Stairs: 6 Wheelchair Mobility Wheelchair Mobility: Yes Wheelchair Assistance: Doctor, general practice: Both upper extremities Wheelchair Parts Management: Needs assistance Distance: 150  Treatment: Pt lying in bed to start - agreeable to PT evaluation. He reports lower back pain and some itchiness on his buttock - LPN made aware of his concerns. Pt pleasant and cooperative, A&Ox4 and participated well in therapy evaluation and treatment. Bed mobility completed at minA level for trunk support with cues for log rolling technique for back precautions. Lumbar corset donned at EOB. Sit<>stand to RW with minA with cues for hand placement and safety. Able to transfer to wheelchair with minA for controlled lowering. W/c propulsion 150' supervision using BUE to propel, min cues for equal push on both sides to maintain straight path. Gait training 2x125' with RW at CGA/minA with cues for upright posture, keeping body within walker frame, and equal stride length. Stair training initiated using 2 hand rails with 6 steps - able to navigate 2x4 steps (seated rest) with minA while pt performed via self selected reciprocal pattern. No knee buckling observed with either leg leading ascent vs descent. TUG completed with RW in 25s - scores > 13.5s indicate increased falls risk. Finished session with car transfer training at minA level using the RW - patient needing safety cues for  approach to get his bottom in 1st before legs. Returned to his room and pt reporting urgent need to void so taken to the bathroom and  he was continent of bladder while sitting - safety cues again needed to ensure fully over surface before sitting. Pt ended treatment sitting in wheelchair, seat belt alarm on, needs met.   Instructed pt in results of PT evaluation as detailed above, PT POC, rehab potential, rehab goals, and discharge recommendations. Additionally discussed CIR's policies regarding fall safety and use of chair alarm and/or quick release belt. Pt verbalized understanding and in agreement. Will update pt's family members as they become available.   Discharge Criteria: Patient will be discharged from PT if patient refuses treatment 3 consecutive times without medical reason, if treatment goals not met, if there is a change in medical status, if patient makes no progress towards goals or if patient is discharged from hospital.  The above assessment, treatment plan, treatment alternatives and goals were discussed and mutually agreed upon: by patient  Sherlean SHAUNNA Perks  PT, DPT, CSRS  12/06/2023, 10:14 AM

## 2023-12-07 ENCOUNTER — Inpatient Hospital Stay (HOSPITAL_COMMUNITY)

## 2023-12-07 DIAGNOSIS — R569 Unspecified convulsions: Secondary | ICD-10-CM

## 2023-12-07 DIAGNOSIS — M48062 Spinal stenosis, lumbar region with neurogenic claudication: Secondary | ICD-10-CM | POA: Diagnosis not present

## 2023-12-07 LAB — CBC
HCT: 31.1 % — ABNORMAL LOW (ref 39.0–52.0)
Hemoglobin: 10.5 g/dL — ABNORMAL LOW (ref 13.0–17.0)
MCH: 31.5 pg (ref 26.0–34.0)
MCHC: 33.8 g/dL (ref 30.0–36.0)
MCV: 93.4 fL (ref 80.0–100.0)
Platelets: 247 K/uL (ref 150–400)
RBC: 3.33 MIL/uL — ABNORMAL LOW (ref 4.22–5.81)
RDW: 13.4 % (ref 11.5–15.5)
WBC: 5.8 K/uL (ref 4.0–10.5)
nRBC: 0 % (ref 0.0–0.2)

## 2023-12-07 LAB — BASIC METABOLIC PANEL WITH GFR
Anion gap: 7 (ref 5–15)
BUN: 25 mg/dL — ABNORMAL HIGH (ref 8–23)
CO2: 21 mmol/L — ABNORMAL LOW (ref 22–32)
Calcium: 8.8 mg/dL — ABNORMAL LOW (ref 8.9–10.3)
Chloride: 108 mmol/L (ref 98–111)
Creatinine, Ser: 1.31 mg/dL — ABNORMAL HIGH (ref 0.61–1.24)
GFR, Estimated: 56 mL/min — ABNORMAL LOW (ref >60)
Glucose, Bld: 118 mg/dL — ABNORMAL HIGH (ref 70–99)
Potassium: 4.3 mmol/L (ref 3.5–5.1)
Sodium: 136 mmol/L (ref 135–145)

## 2023-12-07 LAB — AMMONIA: Ammonia: 27 umol/L (ref 9–35)

## 2023-12-07 LAB — IRON AND TIBC
Iron: 33 ug/dL — ABNORMAL LOW (ref 45–182)
Saturation Ratios: 15 % — ABNORMAL LOW (ref 17.9–39.5)
TIBC: 225 ug/dL — ABNORMAL LOW (ref 250–450)
UIBC: 192 ug/dL

## 2023-12-07 LAB — FOLATE: Folate: 12.5 ng/mL (ref >5.9)

## 2023-12-07 LAB — FERRITIN: Ferritin: 76 ng/mL (ref 24–336)

## 2023-12-07 LAB — VITAMIN B12: Vitamin B-12: 2480 pg/mL — ABNORMAL HIGH (ref 180–914)

## 2023-12-07 MED ORDER — SENNOSIDES-DOCUSATE SODIUM 8.6-50 MG PO TABS
1.0000 | ORAL_TABLET | Freq: Two times a day (BID) | ORAL | Status: DC
  Administered 2023-12-07 – 2023-12-09 (×5): 1 via ORAL
  Filled 2023-12-07 (×5): qty 1

## 2023-12-07 MED ORDER — TIZANIDINE HCL 4 MG PO TABS
2.0000 mg | ORAL_TABLET | Freq: Three times a day (TID) | ORAL | Status: DC | PRN
  Administered 2023-12-07 – 2023-12-16 (×15): 2 mg via ORAL
  Filled 2023-12-07 (×16): qty 1

## 2023-12-07 NOTE — Care Management (Signed)
 Inpatient Rehabilitation Center Individual Statement of Services  Patient Name:  Miguel Williams  Date:  12/07/2023  Welcome to the Inpatient Rehabilitation [facility].  Our goal is to provide you with an individualized program based on your diagnosis and situation, designed to meet your specific needs.  With this comprehensive rehabilitation program, you will be expected to participate in at least 3 hours of rehabilitation therapies Monday-Friday, with modified therapy programming on the weekends.  Your rehabilitation program will include the following services:  Physical Therapy (PT), Occupational Therapy (OT), 24 hour per day rehabilitation nursing, Therapeutic Recreaction (TR), Psychology, Neuropsychology, Care Coordinator, Rehabilitation Medicine, Nutrition Services, Pharmacy Services, and Other  Weekly team conferences will be held on Tuesday to discuss your progress.  Your Inpatient Rehabilitation Care Coordinator will talk with you frequently to get your input and to update you on team discussions.  Team conferences with you and your family in attendance may also be held.  Expected length of stay: 7-9 days    Overall anticipated outcome: Supervision  Depending on your progress and recovery, your program may change. Your Inpatient Rehabilitation Care Coordinator will coordinate services and will keep you informed of any changes. Your Inpatient Rehabilitation Care Coordinator's name and contact numbers are listed  below.  The following services may also be recommended but are not provided by the Inpatient Rehabilitation Center:  Driving Evaluations Home Health Rehabiltiation Services Outpatient Rehabilitation Services Vocational Rehabilitation   Arrangements will be made to provide these services after discharge if needed.  Arrangements include referral to agencies that provide these services.  Your insurance has been verified to be:  [facility] Medicare  Your primary doctor is:  Oneil Neth  Pertinent information will be shared with your doctor and your insurance company.  Inpatient Rehabilitation Care Coordinator:  Graeme Feliciana SILK [phone] or ([phone]  Information discussed with and copy given to patient by: Graeme DELENA Feliciana, 12/07/2023, 8:38 AM

## 2023-12-07 NOTE — Plan of Care (Signed)
  Problem: Consults Goal: RH SPINAL CORD INJURY PATIENT EDUCATION Description:  See Patient Education module for education specifics.  Outcome: Progressing   Problem: SCI BOWEL ELIMINATION Goal: RH STG MANAGE BOWEL WITH ASSISTANCE Description: STG Manage Bowel with mod I Assistance. Outcome: Progressing Goal: RH STG SCI MANAGE BOWEL PROGRAM W/ASSIST OR AS APPROPRIATE Description: STG SCI Manage bowel program w/mod I assist or as appropriate. Outcome: Progressing   Problem: SCI BLADDER ELIMINATION Goal: RH STG MANAGE BLADDER WITH ASSISTANCE Description: STG Manage Bladder With mod I Assistance Outcome: Progressing Goal: RH STG SCI MANAGE BLADDER PROGRAM W/ASSISTANCE Description: Manage w mod I assist Outcome: Progressing   Problem: RH SAFETY Goal: RH STG ADHERE TO SAFETY PRECAUTIONS W/ASSISTANCE/DEVICE Description: STG Adhere to Safety Precautions With cues Assistance/Device. Outcome: Progressing   Problem: RH SAFETY Goal: RH STG ADHERE TO SAFETY PRECAUTIONS W/ASSISTANCE/DEVICE Description: STG Adhere to Safety Precautions With cues Assistance/Device. Outcome: Progressing   Problem: RH PAIN MANAGEMENT Goal: RH STG PAIN MANAGED AT OR BELOW PT'S PAIN GOAL Description: Pain < 4 with prns Outcome: Progressing   Problem: RH KNOWLEDGE DEFICIT SCI Goal: RH STG INCREASE KNOWLEDGE OF SELF CARE AFTER SCI Description: Patient and caregiver will be able to manage care at discharge using educational resources independently Outcome: Progressing

## 2023-12-07 NOTE — Progress Notes (Signed)
 PROGRESS NOTE   Subjective/Complaints:  No events overnight.  Miguel Williams reports feeling tired today; cannot give specifics.  Says he has been sleeping well overnight. GF reporting increased irritability, wondering about medications.  BUN/Cr improves slightly today HgB stable; IDA with overlying CKD related anemia; B12 high, other vitamins WNL  ROS: Denies fevers, chills, N/V, abdominal pain, constipation, diarrhea, SOB, cough, chest pain, new weakness or paraesthesias.   + Fatigue  Objective:   No results found. Recent Labs    12/06/23 0446 12/07/23 0513  WBC 7.3 5.8  HGB 10.5* 10.5*  HCT 32.2* 31.1*  PLT 256 247   Recent Labs    12/06/23 0446 12/07/23 0513  NA 140 136  K 4.7 4.3  CL 109 108  CO2 23 21*  GLUCOSE 111* 118*  BUN 29* 25*  CREATININE 1.39* 1.31*  CALCIUM  9.0 8.8*    Intake/Output Summary (Last 24 hours) at 12/07/2023 0939 Last data filed at 12/07/2023 0716 Gross per 24 hour  Intake 592 ml  Output 225 ml  Net 367 ml        Physical Exam: Vital Signs Blood pressure (!) 121/57, pulse 89, temperature 98.1 F (36.7 C), temperature source Oral, resp. rate 20, height 5' 8 (1.727 m), weight 88 kg, SpO2 95%. Constitutional: No apparent distress.  Lying in bed. HENT: No JVD. Neck Supple. Trachea midline. Atraumatic, normocephalic. Eyes: PERRLA. EOMI. Visual fields grossly intact.  ++ Repeated rolling of eyes to the left with sustained attention on exam; resolves with changing focus of attention, Miguel Williams denies any vision deficits or awareness of this occurring  Cardiovascular: RRR, no murmurs/rub/gallops. No Edema. Peripheral pulses 2+  Respiratory: CTAB. No rales, rhonchi, or wheezing. On RA.  Abdomen: + bowel sounds, normoactive. No distention or tenderness.  Skin: C/D/I.  Surgical site with some surrounding erythema following pattern of taping/dressing. -Surgical site well-approximated with  Steri-Strips; no apparent drainage  MSK:      No apparent deformity.  Neuro: Alert and oriented x 3. Normal insight and awareness. Intact Memory. Normal language and speech. Cranial nerve exam unremarkable.   MMT: Unchanged from prior exams BUE 4+/5 prox to distal.  RLE 3/5 HF, KE and 4/5 ADF/PF.  LLE 4-/5 HF, KE and 4+ ADF/PF.   Pt with mild proprioceptive deficits in LE's; BLE  LE ataxia  DTR's hyperreflexic right lower extremity; 1+ LLE No abnl resting tone.    Bilateral upper and lower extremity fine, intention tremor + Clonus on ankle jerk R >> L  Assessment/Plan: 1. Functional deficits which require 3+ hours per day of interdisciplinary therapy in a comprehensive inpatient rehab setting. Physiatrist is providing close team supervision and 24 hour management of active medical problems listed below. Physiatrist and rehab team continue to assess barriers to discharge/monitor Miguel Williams progress toward functional and medical goals  Care Tool:  Bathing    Body parts bathed by Miguel Williams: Right arm, Left arm, Chest, Abdomen, Front perineal area, Right upper leg, Left upper leg, Face   Body parts bathed by helper: Right lower leg, Left lower leg, Buttocks     Bathing assist Assist Level: Minimal Assistance - Miguel Williams > 75%     Upper Body  Dressing/Undressing Upper body dressing   What is the Miguel Williams wearing?: Pull over shirt, Orthosis    Upper body assist Assist Level: Moderate Assistance - Miguel Williams 50 - 74%    Lower Body Dressing/Undressing Lower body dressing      What is the Miguel Williams wearing?: Underwear/pull up, Pants     Lower body assist Assist for lower body dressing: Moderate Assistance - Miguel Williams 50 - 74%     Toileting Toileting    Toileting assist Assist for toileting: Minimal Assistance - Miguel Williams > 75%     Transfers Chair/bed transfer  Transfers assist     Chair/bed transfer assist level: Minimal Assistance - Miguel Williams > 75%      Locomotion Ambulation   Ambulation assist      Assist level: Minimal Assistance - Miguel Williams > 75% Assistive device: Walker-rolling Max distance: 125'   Walk 10 feet activity   Assist     Assist level: Minimal Assistance - Miguel Williams > 75% Assistive device: Walker-rolling   Walk 50 feet activity   Assist    Assist level: Minimal Assistance - Miguel Williams > 75% Assistive device: Walker-rolling    Walk 150 feet activity   Assist Walk 150 feet activity did not occur: Safety/medical concerns (fatigue)         Walk 10 feet on uneven surface  activity   Assist Walk 10 feet on uneven surfaces activity did not occur: Safety/medical concerns         Wheelchair     Assist Is the Miguel Williams using a wheelchair?: Yes Type of Wheelchair: Manual    Wheelchair assist level: Supervision/Verbal cueing Max wheelchair distance: 150'    Wheelchair 50 feet with 2 turns activity    Assist        Assist Level: Supervision/Verbal cueing   Wheelchair 150 feet activity     Assist      Assist Level: Supervision/Verbal cueing   Blood pressure (!) 121/57, pulse 89, temperature 98.1 F (36.7 C), temperature source Oral, resp. rate 20, height 5' 8 (1.727 m), weight 88 kg, SpO2 95%.  1. 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              -Miguel Williams may shower if wound is covered             -ELOS/Goals: 11-13 days, mod I to supervision goals  - Stable to continue inpatient rehab   - 8-14: Developing hyperreflexia R lower extremity greater than left lower extremity; generalized fatigue; abnormal eye rolling movements on exam.  EEG today, strength and sensation in the lower extremities is consistent with prior exams so initial hyperreflexia was likely just postop.  Will monitor closely for neurologic changes.  2.   Antithrombotics: -DVT/anticoagulation:  Mechanical: Sequential compression devices, below knee Bilateral lower extremities             -antiplatelet therapy: N/A  3. Pain Management: Tylenol  prn mild pain or oxycodone  prn severe pain             -encourage pre-treatment with oxycodone  prior to therapies for better activity tolerance--tolerating today   - 8/13: Hx alcohol  abuse, severe neuropathic pain overlying radicular pain - will get B12, folate, B6 levels with next labs--B12 high, folate/B6 look okay  8-14: This like I do not switch her over to tizanidine  every 8 hours as needed for spasticity; she also had hallucinations with baclofen in the past  4. Mood/Behavior/Sleep:  LCSW to follow for evaluation and support.              -antipsychotic agents: N/A  5. Neuropsych/cognition/Hx Wernicke's encephalopathy: This Miguel Williams is capable of making decisions on his own behalf.   - Reviewed notes from [facility] neurology 10-2023; does not meet criteria for idiopathic Parkinson's at this time, but is undergoing current evaluation.  Has history of Wernicke's encephalopathy and was continued to drink at time of last exam.  - On thiamine , B12 supplement.  - 8/14: EEG for abnormal eye rolling movements on exam, fatigue; negative.  Talking with staff, was present on admission but is not as noticeable.  6. Skin/Wound Care: Monitor incision for healing.  -dressing changes as needed. -Routine pressure relief measures.  -8/14: Appears with adhesive allergy on back; dressings removed. surgical site appears stable.  7. Fluids/Electrolytes/Nutrition: Monitor I/O. Check CMET in am.    - 8-13: AKI as below, otherwise stable.  8.  HTN: Monitor BP TID--on Amlodipine  and Avapro .   - Blood pressure stable on current regimen    12/07/2023    5:14 AM 12/06/2023    7:40 PM 12/06/2023   12:43 PM  Vitals with BMI  Systolic 121 132 871  Diastolic 57 69 70  Pulse 89 88 92    9.  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 on 08/07-->recheck labs in am  - 8-13: Stable creatinine, increased BUN on a.m. labs; encourage p.o. fluids, recheck in 2 days  8/14: BUN/creatinine improving  11.  Anemia: Hgb 11.5 on 08/07--> recheck cbc in am.  Of note, has needed multiple blood transfusions s/p fall May of this year.  - 8-13: 10.5, within limits of variability.  Endorses no bleeding.  Trend, get iron studies, vitamin C, folate, B12 with next labs.   8/14: Fe low IDA despite supplement TID - in setting of CKD - may need venofer infusion if downtrends  9.  H/o Gout: On colchicine .  10. Hypothyroid: On synthroid  for supplement.  11. Anxiety/alcohol  abuse: continue Klonopin  at bedtime prn. - Provide education  12. Hx bladder cancer: Treated with TURBT/infusion by Dr. Devere             -see #9             -monitor voiding patterns.             -I/O caths prn  13. Neurogenic bowel/constipation             -no bm since 8/7             -sorbitol  60cc tonight. If no bm, more aggressive approach tomorrow   8-13: Large bowel movement this p.m.--schedule Senokot S1 tab twice daily  LOS: 2 days A FACE TO FACE EVALUATION WAS PERFORMED  Miguel Williams Likes 12/07/2023, 9:39 AM

## 2023-12-07 NOTE — Progress Notes (Signed)
 Inpatient Rehabilitation Care Coordinator Assessment and Plan Patient Details  Name: Miguel Williams MRN: [id number] Date of Birth: October 08, 1945  Today's Date: 12/07/2023  Hospital Problems: Principal Problem:   Lumbar polyradiculopathy Active Problems:   Neurogenic claudication due to lumbar spinal stenosis  Past Medical History:  Past Medical History:  Diagnosis Date   Abdominal pain, lower    Agitation    Allergy, unspecified, initial encounter    Anxiety disorder    Arthritis    Asthma    As a child   Bipolar disorder [facility])    Patient denies   Cancer [facility])    Bladder   Cancer (HCC)    Skin cancer   Chronic kidney disease, stage III (moderate) (HCC)    Chronic kidney disease, stage III (moderate) (HCC)    COPD (chronic obstructive pulmonary disease) (HCC)    Pt denies (10/2023)   Coronary atherosclerosis of native coronary artery    Esophageal reflux    Fatigue    Fungal granuloma    Hip pain    History of 2019 novel coronavirus disease (COVID-19)    History of endoscopy 02/00/2011   History of kidney stones    Hypertension    Hypo-osmolality and hyponatremia    Hypokalemia    Hypothyroidism    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 now has 3oz of wine every other day (as of 10/2023)   Umbilical hernia    Unspecified fall, subsequent encounter    Vitamin D deficiency, unspecified    Wernicke's encephalopathy 06/23/2019   Past Surgical History:  Past Surgical History:  Procedure Laterality  Date   CATARACT EXTRACTION W/ INTRAOCULAR LENS IMPLANT Bilateral    COLONOSCOPY  04/2023   COLONOSCOPY W/ ENDOSCOPIC US      CYSTOSCOPY WITH FULGERATION N/A 03/09/2023   Procedure: CYSTOSCOPY WITH FULGERATION WITH CLOT EVACUATION;  Surgeon: Devere Lonni Righter, MD;  Location: [facility];  Service: Urology;  Laterality: N/A;   CYSTOSCOPY WITH URETHRAL DILATATION N/A 03/08/2023   Procedure: CYSTOSCOPY;  Surgeon: Devere Lonni Righter, MD;  Location: [facility];  Service: Urology;  Laterality: N/A;  30 MINUTES   ESOPHAGOGASTRODUODENOSCOPY     LUMBAR WOUND DEBRIDEMENT N/A 12/01/2023   Procedure: LUMBAR WOUND DEBRIDEMENT;  Surgeon: [name], MD;  Location: [facility];  Service: Neurosurgery;  Laterality: N/A;   TONSILLECTOMY  04/25/1950   Social History:  reports that he has never smoked. He has never used smokeless tobacco. He reports current alcohol  use. He reports that he does not currently use drugs.  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 Designates his brother Mehki Klumpp to make medical decisions.    Abuse/Neglect Abuse/Neglect Assessment Can Be Completed: Yes Physical Abuse: Denies Verbal Abuse: Denies Sexual Abuse: Denies Exploitation of patient/patient's resources: Denies Self-Neglect: Denies  Patient response to: Social Isolation - How often do you feel lonely or isolated from those around you?: Never  Emotional Status Pt's affect, behavior and adjustment status: Pt in good spirits at time of visit Recent Psychosocial Issues: Denies Psychiatric History: Denies Substance Abuse History: Pt reports he quit etoh use in April 2025; denies rec drug use; denies tobacco product use.  Patient / Family Perceptions, Expectations & Goals Pt/Family understanding of illness & functional limitations: Pt has a general understanding of care needs Premorbid pt/family roles/activities: INdependent Anticipated changes in roles/activities/participation: Assistance with ADLs/IADLs Pt/family expectations/goals: Pt goal is to work on building up his strnegth since his legs are weak.  Community Resources [employer]: None Premorbid Home Care/DME Agencies: None Transportation available at discharge: TBD Is the patient able to respond to transportation needs?: Yes In the past 12 months, has lack of transportation kept you from medical appointments or from getting medications?: No In the past 12 months, has lack of transportation kept you from meetings, work, or from getting things needed for daily living?: No Resource referrals recommended: Neuropsychology  Discharge Planning Living Arrangements: Alone Support Systems: Friends/neighbors Type of Residence: Private residence Insurance Resources: Media planner (specify) ([employer] Medicare) Surveyor, quantity Resources: Tree surgeon, Other (Comment) (savings) Financial Screen Referred: No Living Expenses: Own Money Management: Patient Does the patient have any problems obtaining your medications?: No Home Management: Pt manages preparing  meals, and states he also orders take out; has a cleaning service that comes every other week. Patient/Family Preliminary Plans: TBD Care Coordinator Barriers to Discharge: Decreased caregiver support, Insurance for SNF coverage, Lack of/limited family support Care Coordinator Anticipated Follow Up Needs: HH/OP Expected length of stay: 7-9 days  Clinical Impression SW met with pt at bedside to introduce self, explain role, discuss discharge process, and inform on ELOS. He is not a Cytogeneticist. DME- cane, rollator, shower chair with back, and BSC. Aware his friend [name] will be here for fam edu on Tues 1pm-4pm.  Jenisa Monty A Verlee Pope 12/07/2023, 2:42 PM

## 2023-12-07 NOTE — Progress Notes (Signed)
 Occupational Therapy Session Note  Patient Details  Name: Miguel Williams MRN: [id number] Date of Birth: 05/21/1945  Today's Date: 12/07/2023 OT Individual Time: 8694-8654 OT Individual Time Calculation (min): 40 min    Short Term Goals: Week 1:  OT Short Term Goal 1 (Week 1): LTG=STG  Skilled Therapeutic Interventions/Progress Updates:  Pt greeted resting in bed for skilled OT session with focus on functional transfers/mobility, precaution awareness, and standing balance/tolerance.   Pain: Pt with un-rated pain in LLE, OT offering intermediate rest breaks and positioning suggestions throughout session to address pain/fatigue and maximize participation/safety in session.   Functional Transfers: Sit<>stands throughout session with CGA + RW, cuing for safe hand positioning. Ambulatory transfer from room>day room with CGA + RW, cuing provided for RW proximity and attention to BLE placement as patient demo's scissoring gait with fatigue.   Therapeutic Activities: Pt instructed in functional mobility activity targeting management of turns, environmental awareness, and safe RW manage as patient is tasked with weaving in/out of cones. Pt requires multimodal cuing to correct pushing walker away from himself during turns. Teach back utilized for carryover of purpose of activity and translation into mobility at home. Attempted static standing activity for BLE strengthening, activity downgraded due to complexity of steps and increased confusion. Pt then completes 3x5-10 holds of static stance (without reaching to retrieve items) with CGA + RW.   Pt remained sitting in WC, in care of NT/RN, 4Ps assessed and immediate needs met. Pt continues to be appropriate for skilled OT intervention to promote further functional independence in ADLs/IADLs.    Therapy Documentation Precautions:  Precautions Precautions: Back, Fall Required Braces or Orthoses: Spinal Brace Spinal Brace: Lumbar corset, Applied in  sitting position Restrictions Weight Bearing Restrictions Per Provider Order: No   Therapy/Group: Individual Therapy  Nereida Habermann, OTR/L, MSOT  12/07/2023, 5:22 AM

## 2023-12-07 NOTE — Progress Notes (Signed)
 STAT EEG complete. Results pending

## 2023-12-07 NOTE — Progress Notes (Signed)
 Met with patient to review current situation, team conference and plan of care. Patient seem distracted and have to redirect at times. Reviewed pain, bowel, bladder, medications. Continue to follow along to provide educational needs to facilitate preparation for discharge.

## 2023-12-07 NOTE — Plan of Care (Signed)
  Problem: RH Problem Solving Goal: LTG Patient will demonstrate problem solving for (SLP) Description: LTG:  Patient will demonstrate problem solving for basic/complex daily situations with cues  (SLP) Flowsheets (Taken 12/07/2023 1239) LTG: Patient will demonstrate problem solving for (SLP): (mildly complex problems) Other (comment) LTG Patient will demonstrate problem solving for: Supervision   Problem: RH Memory Goal: LTG Patient will use memory compensatory aids to (SLP) Description: LTG:  Patient will use memory compensatory aids to recall biographical/new, daily complex information with cues (SLP) Flowsheets (Taken 12/07/2023 1239) LTG: Patient will use memory compensatory aids to (SLP): Supervision   Problem: RH Awareness Goal: LTG: Patient will demonstrate awareness during functional activites type of (SLP) Description: LTG: Patient will demonstrate awareness during functional activites type of (SLP) Flowsheets (Taken 12/07/2023 1239) Patient will demonstrate during cognitive/linguistic activities awareness type of: Intellectual LTG: Patient will demonstrate awareness during cognitive/linguistic activities with assistance of (SLP): Supervision

## 2023-12-07 NOTE — Procedures (Signed)
 Patient Name: Derryck Shahan  MRN: [id number]  Epilepsy Attending: Arlin MALVA Krebs  Referring Physician/Provider: Emeline Joesph BROCKS, DO  Date: 12/07/2023 Duration: 24.50 mins  Patient history: 78yo M with fatigue and repeated eye rolling throughout exam - patient not aware of events. EEG to evaluate for seizure  Level of alertness: Awake  AEDs during EEG study: None  Technical aspects: This EEG study was done with scalp electrodes positioned according to the 10-20 International system of electrode placement. Electrical activity was reviewed with band pass filter of 1-70Hz , sensitivity of 7 uV/mm, display speed of 76mm/sec with a 60Hz  notched filter applied as appropriate. EEG data were recorded continuously and digitally stored.  Video monitoring was available and reviewed as appropriate.  Description: The posterior dominant rhythm consists of 8 Hz activity of moderate voltage (25-35 uV) seen predominantly in posterior head regions, symmetric and reactive to eye opening and eye closing. Photic driving ws not seen during photic stimulation. Hyperventilation and photic stimulation were not performed.     IMPRESSION: This study is within normal limits. No seizures or epileptiform discharges were seen throughout the recording.  A normal interictal EEG does not exclude the diagnosis of epilepsy.   Andranik Jeune O Shanvi Moyd

## 2023-12-07 NOTE — Evaluation (Addendum)
 Speech Language Pathology Assessment and Plan  Patient Details  Name: Miguel Williams MRN: [id number] Date of Birth: 10/05/1945  SLP Diagnosis: Cognitive Impairments  Rehab Potential: Fair ELOS: 7-10 days    Today's Date: 12/07/2023 SLP Individual Time: 0821-0913 SLP Individual Time Calculation (min): 52 min   Hospital Problem: Principal Problem:   Lumbar polyradiculopathy Active Problems:   Neurogenic claudication due to lumbar spinal stenosis  Past Medical History:  Past Medical History:  Diagnosis Date   Abdominal pain, lower    Agitation    Allergy, unspecified, initial encounter    Anxiety disorder    Arthritis    Asthma    As a child   Bipolar disorder (HCC)    Patient denies   Cancer (HCC)    Bladder   Cancer (HCC)    Skin cancer   Chronic kidney disease, stage III (moderate) (HCC)    Chronic kidney disease, stage III (moderate) (HCC)    COPD (chronic obstructive pulmonary disease) (HCC)    Pt denies (10/2023)   Coronary atherosclerosis of native coronary artery    Esophageal reflux    Fatigue    Fungal granuloma    Hip pain    History of 2019 novel coronavirus disease (COVID-19)    History of endoscopy 02/00/2011   History of kidney stones    Hypertension    Hypo-osmolality and hyponatremia    Hypokalemia    Hypothyroidism    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 now has 3oz of wine every other day (as of 10/2023)   Umbilical hernia    Unspecified fall, subsequent encounter     Vitamin D deficiency, unspecified    Wernicke's encephalopathy 06/23/2019   Past Surgical History:  Past Surgical History:  Procedure Laterality Date   CATARACT EXTRACTION W/ INTRAOCULAR LENS IMPLANT Bilateral    COLONOSCOPY  04/2023   COLONOSCOPY W/ ENDOSCOPIC US      CYSTOSCOPY WITH FULGERATION N/A 03/09/2023   Procedure: CYSTOSCOPY WITH FULGERATION WITH CLOT EVACUATION;  Surgeon: Devere Lonni Righter, MD;  Location: [facility];  Service: Urology;  Laterality: N/A;   CYSTOSCOPY WITH URETHRAL DILATATION N/A 03/08/2023   Procedure: CYSTOSCOPY;  Surgeon: Devere Lonni Righter, MD;  Location: [facility];  Service: Urology;  Laterality: N/A;  30 MINUTES   ESOPHAGOGASTRODUODENOSCOPY     LUMBAR WOUND DEBRIDEMENT N/A 12/01/2023   Procedure: LUMBAR WOUND DEBRIDEMENT;  Surgeon: [name], MD;  Location: [facility] OR;  Service: Neurosurgery;  Laterality: N/A;   TONSILLECTOMY  04/25/1951    Assessment / Plan / Recommendation Clinical Impression   Pt is a 78 year old male with medical hx significant for: posterior lumbar fusion L4-5 and L5-S1 (11/15/23). Pt presented to [facility] on 11/30/23 d/t low back pain and weakness. Pt reported bilateral leg pain ever since neurosurgical procedure ~2 weeks prior. Pt has had falls. Imaging showed large what looks like epidural hematoma and wound hematoma.  Pt underwent reexploration of lumbar wound for evacuation of hematoma by Dr. [name] on 12/01/23. Therapy evaluations completed and CIR recommended d/t pt's deficits in functional mobility.  Cognitive-Linguistic Evaluation: Patient presents with moderate deficits in the areas of immediate memory, delayed recall, and problem solving as demonstrated via performance on Repeatable Battery for the Assessment of Neuropsychological Status (RBANS). Patient scored WFL during attention and language subtests but scored within the 1st percentile for delayed recall when compared to the average performance of  age-related peers. Patient further exhibits impaired awareness of deficits, noting that though he has identified changes to memory over the past couple of years, he states he continues to have no difficulty with ADLs/iADL tasks. Per report from PT/OT team, patient exhibits significant difficulty recalling therapy tasks/recommendations as well as physical precautions. Patient exhibits limited functional carryover of recall of prior completed therapy sessions upon SLP prompting. At baseline, patient utilizes phone calendar and to-do list strategies to manage gradual changes to cognition as well as pill organizer to assist with medication safety/recall. Patient would likely benefit from formal neuropsychological evaluation during stay to assess for underlying causes of cognitive deficits and would benefit from ongoing SLP services to target aforementioned deficits. Patient was left in chair with call bell in reach and chair alarm set. SLP will continue to target goals per plan of care.     Skilled Therapeutic Interventions          SLP conducted skilled evaluation session to assess cognitive-linguistic function. Utilized RBANS and patient and/or family interview. Full results above.   SLP Assessment  Patient will need skilled Speech Lanaguage Pathology Services during CIR admission    Recommendations  Recommendations for Other Services: Neuropsych consult Patient destination: Home Follow up Recommendations: Outpatient SLP Equipment Recommended: None recommended by SLP    SLP Frequency 3 to 5 out of 7 days   SLP Duration  SLP Intensity  SLP Treatment/Interventions 7-10 days  Minumum of 1-2 x/day, 30 to 90 minutes  Cognitive remediation/compensation;Internal/external aids;Cueing hierarchy;Environmental controls;Therapeutic Activities;Functional tasks;Patient/family education    Pain Pain Assessment Pain Scale: 0-10 Pain Score: 0-No pain  Prior Functioning Cognitive/Linguistic Baseline:  Information not available Type of Home: House  Lives With: Friend(s);Alone (friend intermittently present to provide assistance) Available Help at Discharge: Home health;Available 24 hours/day;Personal care attendant Vocation: Retired  SLP Evaluation Cognition Overall Cognitive Status: Impaired/Different from baseline Arousal/Alertness: Awake/alert Orientation Level: Oriented X4 Year: 2025 Month: August Day of Week: Correct Attention: Sustained;Selective Sustained Attention: Appears intact Selective Attention: Appears intact Memory: Impaired Memory Impairment: Storage deficit;Retrieval deficit;Decreased recall of new information;Decreased short term memory Decreased Short Term Memory: Verbal basic;Functional basic Awareness: Impaired Awareness Impairment: Intellectual impairment;Emergent impairment Problem Solving: Impaired Problem Solving Impairment: Verbal complex;Functional complex Executive Function: Self Correcting;Self Monitoring Self Monitoring: Impaired Self Monitoring Impairment: Verbal complex;Functional complex Self Correcting: Impaired Self Correcting Impairment: Verbal complex;Functional complex Safety/Judgment: Impaired  Comprehension Auditory Comprehension Overall Auditory Comprehension: Appears within functional limits for tasks assessed Expression Expression Primary Mode of Expression: Verbal Verbal Expression Overall Verbal Expression: Appears within functional limits for tasks assessed Oral Motor Oral Motor/Sensory Function Overall Oral Motor/Sensory Function: Within functional limits Motor Speech Overall Motor Speech: Appears within functional limits for tasks assessed  Care Tool Care Tool Cognition Ability to hear (with hearing aid or hearing appliances if normally used Ability to hear (with hearing aid or hearing appliances if normally used): 0. Adequate - no difficulty in normal conservation, social interaction, listening to TV   Expression of  Ideas and  Wants Expression of Ideas and Wants: 4. Without difficulty (complex and basic) - expresses complex messages without difficulty and with speech that is clear and easy to understand   Understanding Verbal and Non-Verbal Content Understanding Verbal and Non-Verbal Content: 4. Understands (complex and basic) - clear comprehension without cues or repetitions  Memory/Recall Ability Memory/Recall Ability : Current season;Location of own room;Staff names and faces;That he or she is in a hospital/hospital unit   Short Term Goals: Week 1: SLP Short Term Goal 1 (Week 1): STGs=LTGs d/t ELOS  Refer to Care Plan for Long Term Goals  Recommendations for other services: Neuropsych  Discharge Criteria: Patient will be discharged from SLP if patient refuses treatment 3 consecutive times without medical reason, if treatment goals not met, if there is a change in medical status, if patient makes no progress towards goals or if patient is discharged from hospital.  The above assessment, treatment plan, treatment alternatives and goals were discussed and mutually agreed upon: by patient  Talyn Eddie, M.A., CCC-SLP  Elisha Cooksey A Brigitte Soderberg 12/07/2023, 12:32 PM

## 2023-12-07 NOTE — Progress Notes (Signed)
 Patient ID: Miguel Williams, male   DOB: 04-08-1946, 78 y.o.   MRN: [id number]  [phone]- SW spoke with pt friend [name] to introduce self, explain role, discuss discharge process and inform on ELOS. She reports he needs to be as independent as possible since she can only provide intermittent supervision since she rehabs cats, and has other church obligations. Fam edu on Tuesday 1pm-4pmSABRA Graeme Jude, MSW, LCSW Office: [phone] Cell: [phone] Fax: [phone]

## 2023-12-07 NOTE — Progress Notes (Signed)
 Physical Therapy Session Note  Patient Details  Name: Miguel Williams MRN: [id number] Date of Birth: [birth year]  Today's Date: 12/07/2023 PT Individual Time: 1010-1050 + 8547-8467 PT Individual Time Calculation (min): 40 min + 40 min   Short Term Goals: Week 1:  PT Short Term Goal 1 (Week 1): STG = LTG due to ELOS  Skilled Therapeutic Interventions/Progress Updates:    SESSION 1: Pt presents in room in bed, agreeable to PT. Pt reporting sciatic nerve pain while supine in bed. Session focused on therapeutic activities to promote bed mobility and transfer training to increase safety and awareness with transfers as well as gait training for upright tolerance and safety with RW management. Pt requesting to use restroom prior to session. Pt completes bed mobility impulsively maintaining back precautions however demonstrating modified log roll. Pt completes sit to stand impulsively and ambulates quickly to restroom, requires mod cues for sequencing and positioning in front of toilet prior to taking off pants. Pt stands from toilet impulsively without assist, provided with cues and education to complete transfers with assistance due to fall risk with pt verbalizing understanding. Pt completes hand hygiene with min cues for sequencing, demonstrating poor visual scanning to L. Pt returns to sitting in [facility] with mod cues for use of RW and positioning prior to sitting, demonstrating uncontrolled descent for sitting. Pt then transported to day room, completes gait training 80', 63', 95', and 20' with RW, CGA/min assist and mod cues for upright posture, forward gaze, proximity to RW with pt demonstrating minimal carryover between gait trials, demonstrating impulsive sitting with fatigue and therapist bringing WC in tow for task. Pt returned to room and completes stand step transfer back to bed, completes bed mobility impulsively maintaining back precautions but demonstrating modified log roll, demonstrating BLE muscle  spasm upon return to supine. Pt remains semi reclined with knees elevated to alleviate pain and improve positioning, all needs within reach, call light in place, and bed alarm activated at end of session.  SESSION 2: Pt presents in room handoff from NT with pt in bathroom. Pt does not report pain at start of session. Session focused on NMR for BLE coordination and muscle fiber recruitment and therapeutic activity for transfer training, sequencing functional tasks. Pt impulsively stands without assist from toilet, demonstrating no carryover from previous session education on safety precautions. Pt ambualtes with CGA/min assist to sink with min cues for sequencing task. Pt transported to day room via [facility], demonstrates some irritability with music playing with dance group but agreeable to continue with participation with therapy in day room. Pt completes stand step transfer with RW mod cues for positioning prior to uncontrolled sit on nustep, min assist. Pt participates with continuous training on nustep with BUE/BLE for BLE coordinaiton and muscle fiber recruitment needed for functional transfers and ambulation, L3 maintaining 45-60 SPM throughout task. Pt completes stand step transfer with RW improved positioning and control to sit to [facility] with min cues. Pt transported to main gym and compeltes NMR in //bars completing side stepping 3x6' with max cues for forward gaze and upright posture to facilitate improved glute activation with task. Pt returns to room and completes ambulatory transfer back to bed with min assist. Pt completes sit to supine with supervision, continues to demonstrate BLE muscle spasm upon return to semi reclined position. Pt remains semi reclined in bed  with all needs within reach, cal light in place and bed alarm activated at end of session.   Therapy Documentation Precautions:  Precautions  Precautions: Back, Fall Required Braces or Orthoses: Spinal Brace Spinal Brace: Lumbar corset,  Applied in sitting position Restrictions Weight Bearing Restrictions Per Provider Order: No   Therapy/Group: Individual Therapy  Reche Ohara PT, DPT 12/07/2023, 3:13 PM

## 2023-12-08 DIAGNOSIS — F418 Other specified anxiety disorders: Secondary | ICD-10-CM

## 2023-12-08 DIAGNOSIS — M48062 Spinal stenosis, lumbar region with neurogenic claudication: Secondary | ICD-10-CM | POA: Diagnosis not present

## 2023-12-08 NOTE — Consult Note (Signed)
 Neuropsychological Consultation Comprehensive Inpatient Rehab   Patient:   Miguel Williams   DOB:   Mar 27, 1946  MR Number:  [id number]  Location:  [facility] [address] [place] [address] Dept: [phone] Loc: [phone]           Date of Service:   12/08/2023  Start Time:   8 AM End Time:   9 AM  Provider/Observer:  Norleen Asa, Psy.D.       Clinical Neuropsychologist       Billing Code/Service: [id number]  PATIENT: Miguel Williams  REASON FOR REFERRAL: Referred for neuropsychological consultation regarding cognitive adjustment associated with a history of depression and ongoing cognitive changes.  HISTORY OF PRESENTING CONCERN: Admitted to the Comprehensive Inpatient Rehabilitation (CIR) unit due to functional decline following recent neurosurgical procedures. Underwent a lumbar interbody fusion at L4/5 and L5/S1 on 11/15/2023. Was discharged home on 11/16/2023. Was subsequently readmitted on 11/30/2023 with frequent falls, bilateral lower extremity weakness, and progressive back and leg pain.  An MRI of the spine revealed a 6.8 cm posterior paraspinal fluid collection causing anterior displacement of the cauda equina nerve roots, with severe L4/5 and moderate L5/S1 canal stenosis. A re-exploration of the lumbar wound with evacuation of a hematoma and placement of a JP drain was performed by Dr. [name] on 12/01/2023.  Current presentation is characterized by right greater than left lower extremity weakness, decreased posture, right knee instability, dizziness, and an unsteady gait requiring multimodal cues with poor carryover. A new spasm has developed since admission to rehab.  PAST MEDICAL HISTORY: - Wernicke's encephalopathy - Bladder cancer - Chronic kidney disease, stage III - Idiopathic aseptic necrosis of bilateral femurs - Anxiety disorder - Depression - Renal calculi - Lumbar stenosis with  neurogenic claudication  MEDICATIONS: - Klonopin  - Cymbalta   SESSION SUMMARY: The purpose of the neuropsychological consultation was explained. The session focused on providing psychoeducation to establish a conceptual framework for the current symptoms and recovery process. The patient was oriented and engaged, demonstrating good attention and comprehension throughout the discussion.  Review of Cognitive status was not consistent with Wernicke's encephalopthy.  While patient had some degree of attentional issues and some memory issues that were likely associated with recent complexity of medical status etc. the patient did demonstrate a degree of memory and learning for recent events that were inconsistent with a Warnicke's type condition.  While the patient may very well of been encephalopathic previously due to significant alcohol  abuse it does not appear to have progressed completely to a Warnicke's encephalopathy.  Extensive psychoeducation was provided regarding the relationship between physical and psychological functioning. The nature of anxiety was discussed as a normal and adaptive survival mechanism, differentiating it from a disorder. The role of anxiety in motivating the patient to seek medical attention for his postoperative symptoms was highlighted.  The medical classifications of disease, disorder, and syndrome were explained to provide context for his current neurological status. The patient's postoperative course was reviewed in detail, including the initial L4-S1 fusion, subsequent development of a hematoma causing spinal cord compression, and the decompression surgery to evacuate the blood collection. The mechanism of nerve injury due to both compression and rapid decompression was explained, with a focus on myelin damage as a likely contributor to his current symptoms. The development of spasms was framed as a potential sign of nerve recovery rather than a new pathology.  The  rationale for his admission to the CIR  unit was discussed, emphasizing that he is considered a good candidate for rehabilitation with expected potential for improvement. The importance of early and intensive rehabilitation in stimulating neurological recovery and maximizing functional outcomes was stressed. The recovery timeline was discussed, noting a 6-12 month window for nerve healing, with most recovery occurring in the first six months. The patient expressed understanding and appeared receptive to the information provided.  Medical History:   Past Medical History:  Diagnosis Date   Abdominal pain, lower    Agitation    Allergy, unspecified, initial encounter    Anxiety disorder    Arthritis    Asthma    As a child   Bipolar disorder [facility])    Patient denies   Cancer [facility])    Bladder   Cancer (HCC)    Skin cancer   Chronic kidney disease, stage III (moderate) (HCC)    Chronic kidney disease, stage III (moderate) (HCC)    COPD (chronic obstructive pulmonary disease) (HCC)    Pt denies (10/2023)   Coronary atherosclerosis of native coronary artery    Esophageal reflux    Fatigue    Fungal granuloma    Hip pain    History of 2019 novel coronavirus disease (COVID-19)    History of endoscopy 02/00/2011   History of kidney stones    Hypertension    Hypo-osmolality and hyponatremia    Hypokalemia    Hypothyroidism    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   Lumbar epidural mass (HCC) 12/01/2023   Spinal stenosis of lumbar region 11/15/2023   Family history of alcoholism 11/06/2023   Headache, occipital 11/06/2023   History of pancreatitis 11/06/2023   Hyperparathyroidism (HCC) 11/06/2023   Hypertension 11/06/2023   Joint swelling 11/06/2023   Joint stiffness 11/06/2023   Laryngopharyngeal reflux 11/06/2023   Lumbar polyradiculopathy 11/06/2023   Memory loss 11/06/2023   Anemia, blood loss 11/06/2023   Symptomatic anemia 08/10/2023   Anemia 08/09/2023   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 11/27/2022   Urinary tract infection without hematuria 11/09/2022   Bladder tumor 09/12/2022   Nausea and vomiting 09/12/2022   Kidney stones 12/11/2021   Scalp laceration 07/01/2021   Wernicke's encephalopathy 06/23/2019   Chronic kidney disease, stage III (moderate) (HCC)    Hyperkalemia 04/12/2019   Chronic kidney disease due to hypertension 04/12/2019   Stage 3a chronic kidney disease (HCC) 04/12/2019   Alcohol  use, 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) 04/12/2019  Pyelonephritis 04/05/2019   AKI (acute kidney injury) (HCC) 04/05/2019   Ureterolithiasis 04/05/2019   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   Presbycusis of both ears 05/27/2016   Hardening of the aorta (main artery of the heart) (HCC) 02/15/2016   Neck pain 01/15/2016   Tremor 12/18/2015   Hearing loss of left ear 06/04/2015   Other specified diseases of blood and blood-forming organs 07/28/2014   Long term current use of therapeutic drug 09/09/2013   Hyperlipidemia 04/03/2013   Hemorrhoids 02/18/2013    History of infectious disease 02/18/2013   Gout 02/18/2013   Pain, dental 01/01/2013   Health maintenance examination 03/30/2011   Abnormal prostate exam 03/08/2011   Abnormal prostate specific antigen 03/08/2011   GASTROESOPHAGEAL REFLUX DISEASE, CHRONIC 12/24/2008   ACUTE BRONCHITIS 12/16/2008   Asthma with exacerbation 12/16/2008   GANGLION CYST, TENDON SHEATH 06/27/2008   FOOT PAIN, LEFT 05/23/2008   ADVERSE DRUG REACTION 03/10/2008   GOUT 03/08/2007   BPH/LUTS W/O OBSTRUCTION 03/08/2007   ELEVATED BP W/O HYPERTENSION 03/08/2007   INSOMNIA, CHRONIC, MILD 10/02/2006   CERUMEN IMPACTION, BILATERAL 10/02/2006    ASSESSMENT & PLAN: The patient demonstrates an appropriate understanding of his complex medical situation and the rationale for his current rehabilitation.  The patient's current status was not consistent with significant/profound neurological damage seen with Warnicke's type pathology.  While cognition had some deficits noted with attention and memory these were not profound and the patient clearly demonstrated awareness and understanding of recent events although details may have been somewhat fragmented.  The session aimed to provide a cognitive framework to manage the uncertainty of recovery, reduce distress, and enhance engagement in therapies. Continued participation in the intensive rehabilitation program is indicated. Prognosis for improvement is considered favorable, though the extent of recovery remains uncertain. No acute psychiatric concerns were identified. Will continue to follow as needed while on the unit. A team conference is scheduled for the upcoming Tuesday to review progress and refine the treatment plan.          Electronically Signed   _______________________ Norleen Asa, Psy.D. Clinical Neuropsychologist

## 2023-12-08 NOTE — Progress Notes (Signed)
 Speech Language Pathology Daily Session Note  Patient Details  Name: [name] MRN: [id number] Date of Birth: [birth year]  Today's Date: 12/08/2023 SLP Individual Time: 1000-1043 SLP Individual Time Calculation (min): 43 min  Short Term Goals: Week 1: SLP Short Term Goal 1 (Week 1): STGs=LTGs d/t ELOS  Skilled Therapeutic Interventions: Skilled therapy session focused on cognitive goals. SLP facilitated completion of medication management task to encourage use of problem solving, memory and organizational skills. SLP  provided supervision-mod I A for patient to complete BID pillbox according to written and verbalized directions. Patient with x1 mistakes, though able to self correct with min cues. SLP educated patient on continuing to utilize pillbox upon d/c to aid in planning, organization, and memory. Patient verbalized understanding. SLP continued to challenge patient through identification of medication mistakes task. Patient required supervisionA to identify errors in BID pillbox and correct according to directions. Patient left in bed with alarm set and call bell in reach. Continue POC.   Pain Back pain - nursing aware   Therapy/Group: Individual Therapy  Jaryiah Mehlman M.A., CCC-SLP 12/08/2023, 7:40 AM

## 2023-12-08 NOTE — IPOC Note (Signed)
 Overall Plan of Care [facility]) Patient Details Name: Miguel Williams MRN: [id number] DOB: 1946-02-05  Admitting Diagnosis: Neurogenic claudication due to lumbar spinal stenosis  Hospital Problems: Principal Problem:   Neurogenic claudication due to lumbar spinal stenosis Active Problems:   Lumbar polyradiculopathy   Depression with anxiety     Functional Problem List: Nursing Bladder, Safety, Bowel, Skin Integrity, Endurance, Medication Management, Pain  PT Balance, Endurance, Motor, Pain, Safety, Skin Integrity  OT Balance, Sensory, Edema, Endurance, Motor, Pain  SLP Cognition, Safety  TR         Basic ADL's: OT Bathing, Dressing, Toileting     Advanced  ADL's: OT None     Transfers: PT Bed Mobility, Bed to Chair, Car  OT Toilet     Locomotion: PT Ambulation, Stairs     Additional Impairments: OT None  SLP Social Cognition   Problem Solving, Memory, Awareness  TR      Anticipated Outcomes Item Anticipated Outcome  Self Feeding n/a  Swallowing      Basic self-care  mod I  Toileting  mod I   Bathroom Transfers mod I  Bowel/Bladder  manage bowel w mod I and bladder w mod I assist  Transfers  supervision  Locomotion  supervision  Communication     Cognition  supervision  Pain  PAin < 4 with prns  Safety/Judgment  manage safety w cues   Therapy Plan: PT Intensity: Minimum of 1-2 x/day ,45 to 90 minutes PT Frequency: 5 out of 7 days PT Duration Estimated Length of Stay: 7-9 days OT Intensity: Minimum of 1-2 x/day, 45 to 90 minutes OT Frequency: 5 out of 7 days OT Duration/Estimated Length of Stay: ~ 7 days SLP Intensity: Minumum of 1-2 x/day, 30 to 90 minutes SLP Frequency: 3 to 5 out of 7 days SLP Duration/Estimated Length of Stay: 7-10 days   Team Interventions: Nursing Interventions Bladder Management, Bowel Management, Disease Management/Prevention, Pain Management, Discharge Planning, Patient/Family Education, Medication Management  PT  interventions Ambulation/gait training, Discharge planning, Psychosocial support, Functional mobility training, Therapeutic Activities, Wheelchair propulsion/positioning, Therapeutic Exercise, Skin care/wound management, Neuromuscular re-education, Disease management/prevention, Warden/ranger, Cognitive remediation/compensation, DME/adaptive equipment instruction, Pain management, Splinting/orthotics, UE/LE Coordination activities, UE/LE Strength taining/ROM, Stair training, Patient/family education, Functional electrical stimulation, Community reintegration  OT Interventions Warden/ranger, Disease mangement/prevention, Neuromuscular re-education, Self Care/advanced ADL retraining, DME/adaptive equipment instruction, Pain management, Skin care/wound managment, Community reintegration, Equities trader education, Discharge planning, Functional mobility training, Psychosocial support, Therapeutic Activities, Splinting/orthotics, UE/LE Coordination activities, UE/LE Strength taining/ROM  SLP Interventions Cognitive remediation/compensation, Internal/external aids, Cueing hierarchy, Environmental controls, Therapeutic Activities, Functional tasks, Patient/family education  TR Interventions    SW/CM Interventions Discharge Planning, Psychosocial Support, Patient/Family Education   Barriers to Discharge MD  Medical stability, Home enviroment access/loayout, Lack of/limited family support, Insurance for SNF coverage, Pending surgery, and Behavior  Nursing Decreased caregiver support, Home environment access/layout 2 level main B+B 2 ste left rail;Has been needing a little help and has been using RW more than cane after recent back surgery. Prior to back surgery, pt was mod I using cane mostly but has RW available when needed. Pt reports ~x6 falls since January  ADLs Comments: RN manages meds and does driving and also does cooking and cleaning; pt dresses and bathes himself  PT Home  environment access/layout, Lack of/limited family support, Insurance for SNF coverage    OT None    SLP      SW Decreased caregiver support, Insurance for SNF coverage, Lack of/limited family  support     Team Discharge Planning: Destination: PT-Home ,OT- Home , SLP-Home Projected Follow-up: PT-24 hour supervision/assistance, Outpatient PT, OT-  None, SLP-Outpatient SLP Projected Equipment Needs: PT-To be determined, OT- To be determined, SLP-None recommended by SLP Equipment Details: PT- , OT-  Patient/family involved in discharge planning: PT- Patient,  OT-Patient, SLP-Patient  MD ELOS: 11-13 days Medical Rehab Prognosis:  Good Assessment: The patient has been admitted for CIR therapies with the diagnosis of lumbar myelopathy. The team will be addressing functional mobility, strength, stamina, balance, safety, adaptive techniques and equipment, self-care, bowel and bladder mgt, patient and caregiver education, . Goals have been set at supervision PT/OT. Anticipated discharge destination is home.       See Team Conference Notes for weekly updates to the plan of care

## 2023-12-08 NOTE — Progress Notes (Signed)
 Occupational Therapy Session Note  Patient Details  Name: Miguel Williams MRN: [id number] Date of Birth: 15-Nov-1945  Today's Date: 12/08/2023 OT Individual Time: 1050-1200 OT Individual Time Calculation (min): 70 min    Short Term Goals: Week 1:  OT Short Term Goal 1 (Week 1): LTG=STG  Skilled Therapeutic Interventions/Progress Updates:  Pt greeted resting in bed for skilled OT session with focus on BADL retraining and functional transfers.   Pain: Pt with un-rated LLE pain, LPN administers medication during session. OT offering intermediate rest breaks and positioning suggestions throughout session to address pain/fatigue and maximize participation/safety in session.   Functional Transfers: Sit<>stands and ambulatory bathroom transfers with CGA, consistent/heavy multimodal cuing for precaution adherence and management of impulsivity with minimal carryover.   Self Care Tasks: Pt completes the following self care tasks with levels of assistance noted below, UB: OT waterproofs incision site. Bathing/dressing with setup A. Re-educated on surgical orders to don LSO prior to any mobility, regardless of distance.  LB: Introduced long-handled sponge for seated BLE/periarea cleaning. Educated on use of figure-4 technique for BLE drying and threading. Standing hike with CGA for standing balance.  Of note, pt requires Max verbal cuing for sequencing of bathing tasks, demo' increased restlessness.    Education: Re-educated on log-roll technique to manage pain with bed mobility as patient has a tendency to twist UB to reach across self towards the bed rail.   Pt remained resting in bed with 4Ps assessed and immediate needs met. Pt continues to be appropriate for skilled OT intervention to promote further functional independence in ADLs/IADLs.   Therapy Documentation Precautions:  Precautions Precautions: Back, Fall Required Braces or Orthoses: Spinal Brace Spinal Brace: Lumbar corset, Applied in  sitting position Restrictions Weight Bearing Restrictions Per Provider Order: No   Therapy/Group: Individual Therapy  Nereida Habermann, OTR/L, MSOT  12/08/2023, 5:27 AM

## 2023-12-08 NOTE — Progress Notes (Signed)
 Physical Therapy Session Note  Patient Details  Name: Miguel Williams MRN: [id number] Date of Birth: 1945-11-15  Today's Date: 12/08/2023 PT Individual Time: 1425-1537 PT Individual Time Calculation (min): 72 min   Short Term Goals: Week 1:  PT Short Term Goal 1 (Week 1): STG = LTG due to ELOS  Skilled Therapeutic Interventions/Progress Updates:    Pt presents in room supine in bed, agreeable to PT but reporting back spasms. PT notifies RN who provides pain medication at start of session. Session focused on gait training for upright tolerance and management of RW, therapeutic activities to promote bed mobility training, and NMR for dynamic standing balance and BLE muscle fiber recruitment. Pt completes mass practice log roll training completing x3 trials with mod/max verbal/tactile cues and min assist to promote improved body mechanics, able to completes supine to sit with improved technique however still continues to demonstrate difficulty with sit to supine and demonstrating BLE spasm with each one. Therapist dons LSO with max assist, pt completes sit to stand and stand pivot with CGA and RW. Pt transported to day room, completes gait training 2x175' with CGA/light min assist demonstrating improving upright tolerance, BLE knee flexion with fatigue, demonstrating R trendelenberg with R stance phase. Pt then completes interval training seated on kinetron 1 min work/76min rest on resistance 30 cm/sec for 10 min, completed as NMR for BLE muscle fiber recruitment. Pt then completes NMR seated EOM for BLE muscle fiber recruitment, dynamic standing balance, and BLE coordination including: - sit<>stands x10 yellow band around knees, no UE support, cues for controlled descent and anterior trunk lean to decrease posterior LOB - standing mini squat BUE support on RW x15 yellow band around knees, max cues for technique - terminal knee extension x10 BLE yellow band Pt returns to room and completes stand step  transfer with CGA back to bed, impulsively lies down demonstrating partial recall of log roll technique requires min assist for repositioning pt demonstrating spasm. Pt remains semi reclined in bed with all needs within reach, cal light in place and bed alarm activated at end of session.   Therapy Documentation Precautions:  Precautions Precautions: Back, Fall Required Braces or Orthoses: Spinal Brace Spinal Brace: Lumbar corset, Applied in sitting position Restrictions Weight Bearing Restrictions Per Provider Order: No    Therapy/Group: Individual Therapy  Reche Ohara PT, DPT 12/08/2023, 3:40 PM

## 2023-12-08 NOTE — Plan of Care (Signed)
  Problem: Consults Goal: RH SPINAL CORD INJURY PATIENT EDUCATION Description:  See Patient Education module for education specifics.  Outcome: Progressing   Problem: SCI BOWEL ELIMINATION Goal: RH STG MANAGE BOWEL WITH ASSISTANCE Description: STG Manage Bowel with mod I Assistance. Outcome: Progressing Goal: RH STG SCI MANAGE BOWEL PROGRAM W/ASSIST OR AS APPROPRIATE Description: STG SCI Manage bowel program w/mod I assist or as appropriate. Outcome: Progressing   Problem: SCI BLADDER ELIMINATION Goal: RH STG MANAGE BLADDER WITH ASSISTANCE Description: STG Manage Bladder With mod I Assistance Outcome: Progressing Goal: RH STG SCI MANAGE BLADDER PROGRAM W/ASSISTANCE Description: Manage w mod I assist Outcome: Progressing   Problem: RH SAFETY Goal: RH STG ADHERE TO SAFETY PRECAUTIONS W/ASSISTANCE/DEVICE Description: STG Adhere to Safety Precautions With cues Assistance/Device. Outcome: Progressing   Problem: RH SAFETY Goal: RH STG ADHERE TO SAFETY PRECAUTIONS W/ASSISTANCE/DEVICE Description: STG Adhere to Safety Precautions With cues Assistance/Device. Outcome: Progressing   Problem: RH PAIN MANAGEMENT Goal: RH STG PAIN MANAGED AT OR BELOW PT'S PAIN GOAL Description: Pain < 4 with prns Outcome: Progressing

## 2023-12-08 NOTE — Progress Notes (Signed)
 PROGRESS NOTE   Subjective/Complaints:  No events overnight.  Patient does complain of ongoing spasms in his lower extremities, although does feel like these are improving.  Also noting some improved sensation/tingling sensations in his legs.  Vitals are stable, tolerating therapies well today.  No further fatigue.  ROS: Denies fevers, chills, N/V, abdominal pain, constipation, diarrhea, SOB, cough, chest pain, new weakness or paraesthesias.     Objective:   EEG adult Result Date: 12/07/2023 Shelton Arlin KIDD, MD     12/07/2023  3:02 PM Patient Name: Miguel Williams MRN: [id number] Epilepsy Attending: Arlin KIDD Shelton Referring Physician/Provider: Emeline Miguel BROCKS, DO Date: 12/07/2023 Duration: 24.50 mins Patient history: 78yo M with fatigue and repeated eye rolling throughout exam - patient not aware of events. EEG to evaluate for seizure Level of alertness: Awake AEDs during EEG study: None Technical aspects: This EEG study was done with scalp electrodes positioned according to the 10-20 International system of electrode placement. Electrical activity was reviewed with band pass filter of 1-70Hz , sensitivity of 7 uV/mm, display speed of 72mm/sec with a 60Hz  notched filter applied as appropriate. EEG data were recorded continuously and digitally stored.  Video monitoring was available and reviewed as appropriate. Description: The posterior dominant rhythm consists of 8 Hz activity of moderate voltage (25-35 uV) seen predominantly in posterior head regions, symmetric and reactive to eye opening and eye closing. Photic driving ws not seen during photic stimulation. Hyperventilation and photic stimulation were not performed.   IMPRESSION: This study is within normal limits. No seizures or epileptiform discharges were seen throughout the recording. A normal interictal EEG does not exclude the diagnosis of epilepsy. Arlin KIDD Shelton   Recent Labs     12/06/23 0446 12/07/23 0513  WBC 7.3 5.8  HGB 10.5* 10.5*  HCT 32.2* 31.1*  PLT 256 247   Recent Labs    12/06/23 0446 12/07/23 0513  NA 140 136  K 4.7 4.3  CL 109 108  CO2 23 21*  GLUCOSE 111* 118*  BUN 29* 25*  CREATININE 1.39* 1.31*  CALCIUM  9.0 8.8*    Intake/Output Summary (Last 24 hours) at 12/08/2023 1558 Last data filed at 12/08/2023 0900 Gross per 24 hour  Intake 356 ml  Output 275 ml  Net 81 ml        Physical Exam: Vital Signs Blood pressure 114/70, pulse 94, temperature 97.7 F (36.5 C), temperature source Oral, resp. rate 18, height 5' 8 (1.727 m), weight 88 kg, SpO2 97%. Constitutional: No apparent distress.  Lying in bed. HENT: No JVD. Neck Supple. Trachea midline. Atraumatic, normocephalic. Eyes: PERRLA. EOMI. Visual fields grossly intact.  + rolling of right eye during fixation intermittently--less frequent today  Cardiovascular: RRR, no murmurs/rub/gallops. No Edema. Peripheral pulses 2+  Respiratory: CTAB. No rales, rhonchi, or wheezing. On RA.  Abdomen: + bowel sounds, normoactive. No distention or tenderness.  Skin: C/D/I.  Surgical site with some surrounding erythema following pattern of taping/dressing--remains apparent -Surgical site well-approximated with Steri-Strips; no apparent drainage.  Some fluctuance on the superior and most inferior portions of the incision, without drainage, warmth, or erythema.    MSK:      No apparent deformity.  Neuro: Alert and oriented x 3. Normal insight and awareness. Intact Memory. Normal language and speech. Cranial nerve exam unremarkable.   MMT:  BUE 4+/5 prox to distal.  RLE 4-/5 HF, KE and 4+/5 ADF/PF. --Significantly improved LLE 4+/5 HF, KE and 4+ ADF/PF. --Significantly improved  Pt with mild proprioceptive deficits in LE's--improved   BLE  LE ataxia   DTR's hyperreflexic right lower extremity, hyporeflexic LLE --> slightly hyperreflexic in both 8/15  No abnl resting tone.    Bilateral  upper and lower extremity fine, intention tremor + Clonus on ankle jerk R >L   Assessment/Plan: 1. Functional deficits which require 3+ hours per day of interdisciplinary therapy in a comprehensive inpatient rehab setting. Physiatrist is providing close team supervision and 24 hour management of active medical problems listed below. Physiatrist and rehab team continue to assess barriers to discharge/monitor patient progress toward functional and medical goals  Care Tool:  Bathing    Body parts bathed by patient: Right arm, Left arm, Chest, Abdomen, Front perineal area, Right upper leg, Left upper leg, Face   Body parts bathed by helper: Right lower leg, Left lower leg, Buttocks     Bathing assist Assist Level: Minimal Assistance - Patient > 75%     Upper Body Dressing/Undressing Upper body dressing   What is the patient wearing?: Pull over shirt, Orthosis    Upper body assist Assist Level: Moderate Assistance - Patient 50 - 74%    Lower Body Dressing/Undressing Lower body dressing      What is the patient wearing?: Underwear/pull up, Pants     Lower body assist Assist for lower body dressing: Moderate Assistance - Patient 50 - 74%     Toileting Toileting    Toileting assist Assist for toileting: Minimal Assistance - Patient > 75%     Transfers Chair/bed transfer  Transfers assist     Chair/bed transfer assist level: Minimal Assistance - Patient > 75%     Locomotion Ambulation   Ambulation assist      Assist level: Minimal Assistance - Patient > 75% Assistive device: Walker-rolling Max distance: 125'   Walk 10 feet activity   Assist     Assist level: Minimal Assistance - Patient > 75% Assistive device: Walker-rolling   Walk 50 feet activity   Assist    Assist level: Minimal Assistance - Patient > 75% Assistive device: Walker-rolling    Walk 150 feet activity   Assist Walk 150 feet activity did not occur: Safety/medical concerns  (fatigue)         Walk 10 feet on uneven surface  activity   Assist Walk 10 feet on uneven surfaces activity did not occur: Safety/medical concerns         Wheelchair     Assist Is the patient using a wheelchair?: Yes Type of Wheelchair: Manual    Wheelchair assist level: Supervision/Verbal cueing Max wheelchair distance: 150'    Wheelchair 50 feet with 2 turns activity    Assist        Assist Level: Supervision/Verbal cueing   Wheelchair 150 feet activity     Assist      Assist Level: Supervision/Verbal cueing   Blood pressure 114/70, pulse 94, temperature 97.7 F (36.5 C), temperature source Oral, resp. rate 18, height 5' 8 (1.727 m), weight 88 kg, SpO2 97%.  1. 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              -  patient may shower if wound is covered             -ELOS/Goals: 11-13 days, mod I to supervision goals  - Stable to continue inpatient rehab   - 8-14: Developing hyperreflexia R lower extremity greater than left lower extremity; generalized fatigue; abnormal eye rolling movements on exam.  EEG today, strength and sensation in the lower extremities is consistent with prior exams so initial hyperreflexia was likely just postop.  Will monitor closely for neurologic changes.   8-15: EEG negative, eye rolling less apparent today.  Is improving strength in bilateral lower extremities, going from post-op hyporeflexia and hyperreflexic bilaterally.  Therapy performance improving today as compared to yesterday. monitor closely for neurologic changes over the weekend--per therapies, he does a poor job of maintaining spinal precautions in bed.  2.  Antithrombotics: -DVT/anticoagulation:  Mechanical: Sequential compression devices, below knee Bilateral lower extremities             -antiplatelet therapy: N/A--colchicine  for  gout  3. Pain Management: Tylenol  prn mild pain or oxycodone  prn severe pain             -encourage pre-treatment with oxycodone  prior to therapies for better activity tolerance--tolerating today   - 8/13: Hx alcohol  abuse, severe neuropathic pain overlying radicular pain - will get B12, folate, B6 levels with next labs--B12 high, folate/B6 look okay   8-14: This like I do not switch her over to tizanidine  every 8 hours as needed for spasticity; she also had hallucinations with baclofen in the past--doing better with tizanidine   4. Mood/Behavior/Sleep: LCSW to follow for evaluation and support.              -antipsychotic agents: N/A  5. Neuropsych/cognition/Hx Wernicke's encephalopathy: This patient is capable of making decisions on his own behalf.   - Reviewed notes from [facility] neurology 10-2023; does not meet criteria for idiopathic Parkinson's at this time, but is undergoing current evaluation.  Has history of Wernicke's encephalopathy and was continued to drink at time of last exam.  - On thiamine , B12 supplement.  - 8/14: EEG for abnormal eye rolling movements on exam, fatigue; negative.  Talking with staff, was present on admission but is not as noticeable. 8-15: EEG negative.  Neuropsych noted some attention and memory deficits, but not profound I was clear demonstration of awareness of events.  6. Skin/Wound Care: Monitor incision for healing.  -dressing changes as needed. -Routine pressure relief measures.  -8/14: Appears with adhesive allergy on back; dressings removed. surgical site appears stable. - 8-15: Some fluctuance on the top and bottom of surgical site - nontender, no drainage, notably improving neurologic exam. Monitor for now. Labs in AM.   7. Fluids/Electrolytes/Nutrition: Monitor I/O. Check CMET in am.    - 8-13: AKI as below, otherwise stable.   - BMP in AM  8.  HTN: Monitor BP TID--on Amlodipine  and Avapro .   - Blood pressure stable on current regimen     12/08/2023    2:42 PM 12/08/2023    4:00 AM 12/07/2023    7:23 PM  Vitals with BMI  Systolic 114 134 871  Diastolic 70 63 64  Pulse 94 91 89    9.  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 on 08/07-->recheck labs in am  - 8-13: Stable creatinine, increased BUN on a.m. labs; encourage p.o. fluids, recheck in 2 days  8/14: BUN/creatinine improving  11.  Anemia: Hgb 11.5 on 08/07--> recheck cbc in am.  Of note, has needed multiple blood transfusions s/p fall May of this year.  - 8-13: 10.5, within limits of variability.  Endorses no bleeding.  Trend, get iron studies, vitamin C, folate, B12 with next labs.   8/14: Fe low IDA despite supplement TID - in setting of CKD - may need venofer infusion if downtrends   - 8/15: CBC in am  9.  H/o Gout: On colchicine .  10. Hypothyroid: On synthroid  for supplement.  11. Anxiety/alcohol  abuse: continue Klonopin  at bedtime prn. - Provide education  12. Hx bladder cancer: Treated with TURBT/infusion by Dr. Devere             -see #9             -monitor voiding patterns.             -I/O caths prn - voiding continent  13. Neurogenic bowel/constipation             -no bm since 8/7             -sorbitol  60cc tonight. If no bm, more aggressive approach tomorrow   8-13: Large bowel movement this p.m.--schedule Senokot S1 tab twice daily  LOS: 3 days A FACE TO FACE EVALUATION WAS PERFORMED  Miguel Williams Likes 12/08/2023, 3:58 PM

## 2023-12-09 ENCOUNTER — Encounter (HOSPITAL_COMMUNITY): Payer: Self-pay | Admitting: Physical Medicine and Rehabilitation

## 2023-12-09 DIAGNOSIS — M48062 Spinal stenosis, lumbar region with neurogenic claudication: Secondary | ICD-10-CM | POA: Diagnosis not present

## 2023-12-09 LAB — BASIC METABOLIC PANEL WITH GFR
Anion gap: 8 (ref 5–15)
BUN: 31 mg/dL — ABNORMAL HIGH (ref 8–23)
CO2: 22 mmol/L (ref 22–32)
Calcium: 8.7 mg/dL — ABNORMAL LOW (ref 8.9–10.3)
Chloride: 107 mmol/L (ref 98–111)
Creatinine, Ser: 1.36 mg/dL — ABNORMAL HIGH (ref 0.61–1.24)
GFR, Estimated: 53 mL/min — ABNORMAL LOW (ref >60)
Glucose, Bld: 120 mg/dL — ABNORMAL HIGH (ref 70–99)
Potassium: 4.3 mmol/L (ref 3.5–5.1)
Sodium: 137 mmol/L (ref 135–145)

## 2023-12-09 LAB — CBC WITH DIFFERENTIAL/PLATELET
Abs Immature Granulocytes: 0.05 K/uL (ref 0.00–0.07)
Basophils Absolute: 0.1 K/uL (ref 0.0–0.1)
Basophils Relative: 1 %
Eosinophils Absolute: 0.6 K/uL — ABNORMAL HIGH (ref 0.0–0.5)
Eosinophils Relative: 10 %
HCT: 29.9 % — ABNORMAL LOW (ref 39.0–52.0)
Hemoglobin: 10.2 g/dL — ABNORMAL LOW (ref 13.0–17.0)
Immature Granulocytes: 1 %
Lymphocytes Relative: 20 %
Lymphs Abs: 1.1 K/uL (ref 0.7–4.0)
MCH: 31.4 pg (ref 26.0–34.0)
MCHC: 34.1 g/dL (ref 30.0–36.0)
MCV: 92 fL (ref 80.0–100.0)
Monocytes Absolute: 0.6 K/uL (ref 0.1–1.0)
Monocytes Relative: 12 %
Neutro Abs: 3 K/uL (ref 1.7–7.7)
Neutrophils Relative %: 56 %
Platelets: 242 K/uL (ref 150–400)
RBC: 3.25 MIL/uL — ABNORMAL LOW (ref 4.22–5.81)
RDW: 13.2 % (ref 11.5–15.5)
WBC: 5.4 K/uL (ref 4.0–10.5)
nRBC: 0 % (ref 0.0–0.2)

## 2023-12-09 MED ORDER — SENNA 8.6 MG PO TABS
2.0000 | ORAL_TABLET | Freq: Every day | ORAL | Status: DC
  Administered 2023-12-09 – 2023-12-11 (×3): 17.2 mg via ORAL
  Filled 2023-12-09 (×3): qty 2

## 2023-12-09 MED ORDER — DOCUSATE SODIUM 100 MG PO CAPS
100.0000 mg | ORAL_CAPSULE | Freq: Three times a day (TID) | ORAL | Status: DC
  Administered 2023-12-09 – 2023-12-10 (×2): 100 mg via ORAL
  Filled 2023-12-09 (×2): qty 1

## 2023-12-09 NOTE — Progress Notes (Signed)
 PROGRESS NOTE   Subjective/Complaints: Feels so-so Has RLE spasms, medication helps but he does not like to take too much, interested in kpad  Vitals are stable, tolerating therapies well today.  No further fatigue.  ROS: Denies fevers, chills, N/V, abdominal pain, constipation, diarrhea, SOB, cough, chest pain, new weakness or paraesthesias.  +RLE spasms   Objective:   No results found.  Recent Labs    12/07/23 0513 12/09/23 0532  WBC 5.8 5.4  HGB 10.5* 10.2*  HCT 31.1* 29.9*  PLT 247 242   Recent Labs    12/07/23 0513 12/09/23 0532  NA 136 137  K 4.3 4.3  CL 108 107  CO2 21* 22  GLUCOSE 118* 120*  BUN 25* 31*  CREATININE 1.31* 1.36*  CALCIUM  8.8* 8.7*    Intake/Output Summary (Last 24 hours) at 12/09/2023 1750 Last data filed at 12/09/2023 1541 Gross per 24 hour  Intake 360 ml  Output 1150 ml  Net -790 ml        Physical Exam: Vital Signs Blood pressure 134/72, pulse 94, temperature 97.7 F (36.5 C), temperature source Oral, resp. rate 18, height 5' 8 (1.727 m), weight 88 kg, SpO2 100%. Constitutional: No apparent distress.  Lying in bed. In positive spirits HENT: No JVD. Neck Supple. Trachea midline. Atraumatic, normocephalic. Eyes: PERRLA. EOMI. Visual fields grossly intact.  + rolling of right eye during fixation intermittently--less frequent today  Cardiovascular: RRR, no murmurs/rub/gallops. No Edema. Peripheral pulses 2+  Respiratory: CTAB. No rales, rhonchi, or wheezing. On RA.  Abdomen: + bowel sounds, normoactive. No distention or tenderness.  Skin: C/D/I.  Surgical site with some surrounding erythema following pattern of taping/dressing--remains apparent -Surgical site well-approximated with Steri-Strips; no apparent drainage.  Some fluctuance on the superior and most inferior portions of the incision, without drainage, warmth, or erythema.    MSK:      No apparent  deformity.  PRIOR EXAMS: Neuro: Alert and oriented x 3. Normal insight and awareness. Intact Memory. Normal language and speech. Cranial nerve exam unremarkable.   MMT:  BUE 4+/5 prox to distal.  RLE 4-/5 HF, KE and 4+/5 ADF/PF. --Significantly improved LLE 4+/5 HF, KE and 4+ ADF/PF. --Significantly improved  Pt with mild proprioceptive deficits in LE's--improved   BLE  LE ataxia   DTR's hyperreflexic right lower extremity, hyporeflexic LLE --> slightly hyperreflexic in both 8/15  No abnl resting tone.    Bilateral upper and lower extremity fine, intention tremor + Clonus on ankle jerk R >L   Assessment/Plan: 1. Functional deficits which require 3+ hours per day of interdisciplinary therapy in a comprehensive inpatient rehab setting. Physiatrist is providing close team supervision and 24 hour management of active medical problems listed below. Physiatrist and rehab team continue to assess barriers to discharge/monitor patient progress toward functional and medical goals  Care Tool:  Bathing    Body parts bathed by patient: Right arm, Left arm, Chest, Abdomen, Front perineal area, Right upper leg, Left upper leg, Face   Body parts bathed by helper: Right lower leg, Left lower leg, Buttocks     Bathing assist Assist Level: Minimal Assistance - Patient > 75%     Upper  Body Dressing/Undressing Upper body dressing   What is the patient wearing?: Pull over shirt, Orthosis    Upper body assist Assist Level: Moderate Assistance - Patient 50 - 74%    Lower Body Dressing/Undressing Lower body dressing      What is the patient wearing?: Underwear/pull up, Pants     Lower body assist Assist for lower body dressing: Moderate Assistance - Patient 50 - 74%     Toileting Toileting    Toileting assist Assist for toileting: Minimal Assistance - Patient > 75%     Transfers Chair/bed transfer  Transfers assist     Chair/bed transfer assist level: Minimal Assistance -  Patient > 75%     Locomotion Ambulation   Ambulation assist      Assist level: Minimal Assistance - Patient > 75% Assistive device: Walker-rolling Max distance: 125'   Walk 10 feet activity   Assist     Assist level: Minimal Assistance - Patient > 75% Assistive device: Walker-rolling   Walk 50 feet activity   Assist    Assist level: Minimal Assistance - Patient > 75% Assistive device: Walker-rolling    Walk 150 feet activity   Assist Walk 150 feet activity did not occur: Safety/medical concerns (fatigue)         Walk 10 feet on uneven surface  activity   Assist Walk 10 feet on uneven surfaces activity did not occur: Safety/medical concerns         Wheelchair     Assist Is the patient using a wheelchair?: Yes Type of Wheelchair: Manual    Wheelchair assist level: Supervision/Verbal cueing Max wheelchair distance: 150'    Wheelchair 50 feet with 2 turns activity    Assist        Assist Level: Supervision/Verbal cueing   Wheelchair 150 feet activity     Assist      Assist Level: Supervision/Verbal cueing   Blood pressure 134/72, pulse 94, temperature 97.7 F (36.5 C), temperature source Oral, resp. rate 18, height 5' 8 (1.727 m), weight 88 kg, SpO2 100%.  1. Functional deficits secondary to lumbar myelopathy s/Williams decompression and fusion with subsequent hematoma collection which required re-exploration and wash out.              -patient may shower if wound is covered             -ELOS/Goals: 11-13 days, mod I to supervision goals  - Stable to continue inpatient rehab   - 8-14: Developing hyperreflexia R lower extremity greater than left lower extremity; generalized fatigue; abnormal eye rolling movements on exam.  EEG today, strength and sensation in the lower extremities is consistent with prior exams so initial hyperreflexia was likely just postop.  Will monitor closely for neurologic changes.   8-15: EEG negative, eye  rolling less apparent today.  Is improving strength in bilateral lower extremities, going from post-op hyporeflexia and hyperreflexic bilaterally.  Therapy performance improving today as compared to yesterday. monitor closely for neurologic changes over the weekend--per therapies, he does a poor job of maintaining spinal precautions in bed.  2.  Antithrombotics: -DVT/anticoagulation:  Mechanical: Sequential compression devices, below knee Bilateral lower extremities             -antiplatelet therapy: N/A--colchicine  for gout  3. Pain Management: Tylenol  prn mild pain or oxycodone  prn severe pain             -encourage pre-treatment with oxycodone  prior to therapies for better activity tolerance--tolerating today   -  8/13: Hx alcohol  abuse, severe neuropathic pain overlying radicular pain - will get B12, folate, B6 levels with next labs--B12 high, folate/B6 look okay   8-14: This like I do not switch her over to tizanidine  every 8 hours as needed for spasticity; she also had hallucinations with baclofen in the past--doing better with tizanidine   Kpad ordered  4. Mood/Behavior/Sleep: LCSW to follow for evaluation and support.              -antipsychotic agents: N/A  5. Neuropsych/cognition/Hx Wernicke's encephalopathy: This patient is capable of making decisions on his own behalf.   - Reviewed notes from [facility] neurology 10-2023; does not meet criteria for idiopathic Parkinson's at this time, but is undergoing current evaluation.  Has history of Wernicke's encephalopathy and was continued to drink at time of last exam.  - On thiamine , B12 supplement.  - 8/14: EEG for abnormal eye rolling movements on exam, fatigue; negative.  Talking with staff, was present on admission but is not as noticeable. 8-15: EEG negative.  Neuropsych noted some attention and memory deficits, but not profound I was clear demonstration of awareness of events.  6. Skin/Wound Care: Monitor incision for healing.  -dressing  changes as needed. -Routine pressure relief measures.  -8/14: Appears with adhesive allergy on back; dressings removed. surgical site appears stable. - 8-15: Some fluctuance on the top and bottom of surgical site - nontender, no drainage, notably improving neurologic exam. Monitor for now. Labs in AM.   7. Fluids/Electrolytes/Nutrition: Monitor I/O. Check CMET in am.    - 8-13: AKI as below, otherwise stable.   - BMP in AM  8.  HTN: Monitor BP TID--on Amlodipine  and Avapro .   - Blood pressure stable on current regimen    12/09/2023    1:50 PM 12/09/2023    4:11 AM 12/08/2023    7:17 PM  Vitals with BMI  Systolic 134 139 895  Diastolic 72 68 90  Pulse 94 84 90    9.  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 on 08/07-->recheck labs in am  - 8-13: Stable creatinine, increased BUN on a.m. labs; encourage Williams.o. fluids, recheck in 2 days  8/14: BUN/creatinine improving  11.  Anemia: Hgb 11.5 on 08/07--> recheck cbc in am.  Of note, has needed multiple blood transfusions s/Williams fall May of this year.  - 8-13: 10.5, within limits of variability.  Endorses no bleeding.  Trend, get iron studies, vitamin C, folate, B12 with next labs.   8/14: Fe low IDA despite supplement TID - in setting of CKD - may need venofer infusion if downtrends   - 8/15: CBC in am  9.  H/o Gout: On colchicine .   10. Hypothyroid: continue synthroid  for supplement.   11. Anxiety/alcohol  abuse: continue Klonopin  at bedtime prn. - Provide education  12. Hx bladder cancer: Treated with TURBT/infusion by Dr. Devere             -see #9             -monitor voiding patterns.             -I/O caths prn - voiding continent  13. Neurogenic bowel/constipation   8-13: Large bowel movement this Williams.m.--schedule Senokot S1 tab twice daily  Messaged nursing to confirm accuracy of date of last BM  LOS: 4 days A FACE TO FACE EVALUATION WAS PERFORMED  Miguel Williams  Ranada Vigorito 12/09/2023, 5:50 PM

## 2023-12-09 NOTE — Plan of Care (Signed)
  Problem: Consults Goal: RH SPINAL CORD INJURY PATIENT EDUCATION Description:  See Patient Education module for education specifics.  Outcome: Progressing   Problem: SCI BOWEL ELIMINATION Goal: RH STG MANAGE BOWEL WITH ASSISTANCE Description: STG Manage Bowel with mod I Assistance. Outcome: Progressing Goal: RH STG SCI MANAGE BOWEL PROGRAM W/ASSIST OR AS APPROPRIATE Description: STG SCI Manage bowel program w/mod I assist or as appropriate. Outcome: Progressing   Problem: SCI BLADDER ELIMINATION Goal: RH STG MANAGE BLADDER WITH ASSISTANCE Description: STG Manage Bladder With mod I Assistance Outcome: Progressing Goal: RH STG SCI MANAGE BLADDER PROGRAM W/ASSISTANCE Description: Manage w mod I assist Outcome: Progressing   Problem: RH SAFETY Goal: RH STG ADHERE TO SAFETY PRECAUTIONS W/ASSISTANCE/DEVICE Description: STG Adhere to Safety Precautions With cues Assistance/Device. Outcome: Progressing   Problem: RH PAIN MANAGEMENT Goal: RH STG PAIN MANAGED AT OR BELOW PT'S PAIN GOAL Description: Pain < 4 with prns Outcome: Progressing   Problem: RH KNOWLEDGE DEFICIT SCI Goal: RH STG INCREASE KNOWLEDGE OF SELF CARE AFTER SCI Description: Patient and caregiver will be able to manage care at discharge using educational resources independently Outcome: Progressing

## 2023-12-10 DIAGNOSIS — M48062 Spinal stenosis, lumbar region with neurogenic claudication: Secondary | ICD-10-CM | POA: Diagnosis not present

## 2023-12-10 LAB — BASIC METABOLIC PANEL WITH GFR
Anion gap: 8 (ref 5–15)
BUN: 24 mg/dL — ABNORMAL HIGH (ref 8–23)
CO2: 21 mmol/L — ABNORMAL LOW (ref 22–32)
Calcium: 8.7 mg/dL — ABNORMAL LOW (ref 8.9–10.3)
Chloride: 109 mmol/L (ref 98–111)
Creatinine, Ser: 1.32 mg/dL — ABNORMAL HIGH (ref 0.61–1.24)
GFR, Estimated: 55 mL/min — ABNORMAL LOW (ref >60)
Glucose, Bld: 121 mg/dL — ABNORMAL HIGH (ref 70–99)
Potassium: 4.2 mmol/L (ref 3.5–5.1)
Sodium: 138 mmol/L (ref 135–145)

## 2023-12-10 LAB — CBC WITH DIFFERENTIAL/PLATELET
Abs Immature Granulocytes: 0.05 K/uL (ref 0.00–0.07)
Basophils Absolute: 0.1 K/uL (ref 0.0–0.1)
Basophils Relative: 2 %
Eosinophils Absolute: 0.6 K/uL — ABNORMAL HIGH (ref 0.0–0.5)
Eosinophils Relative: 12 %
HCT: 31.7 % — ABNORMAL LOW (ref 39.0–52.0)
Hemoglobin: 10.6 g/dL — ABNORMAL LOW (ref 13.0–17.0)
Immature Granulocytes: 1 %
Lymphocytes Relative: 20 %
Lymphs Abs: 1 K/uL (ref 0.7–4.0)
MCH: 31.2 pg (ref 26.0–34.0)
MCHC: 33.4 g/dL (ref 30.0–36.0)
MCV: 93.2 fL (ref 80.0–100.0)
Monocytes Absolute: 0.5 K/uL (ref 0.1–1.0)
Monocytes Relative: 10 %
Neutro Abs: 2.7 K/uL (ref 1.7–7.7)
Neutrophils Relative %: 55 %
Platelets: 249 K/uL (ref 150–400)
RBC: 3.4 MIL/uL — ABNORMAL LOW (ref 4.22–5.81)
RDW: 13.3 % (ref 11.5–15.5)
WBC: 4.8 K/uL (ref 4.0–10.5)
nRBC: 0 % (ref 0.0–0.2)

## 2023-12-10 MED ORDER — DOCUSATE SODIUM 100 MG PO CAPS
100.0000 mg | ORAL_CAPSULE | Freq: Two times a day (BID) | ORAL | Status: DC
  Administered 2023-12-10 – 2023-12-15 (×10): 100 mg via ORAL
  Filled 2023-12-10 (×10): qty 1

## 2023-12-10 NOTE — Progress Notes (Signed)
 Physical Therapy Session Note  Patient Details  Name: Miguel Williams MRN: [id number] Date of Birth: 01-23-1946  Today's Date: 12/10/2023 PT Individual Time: 1445-1530 PT Individual Time Calculation (min): 45 min   Short Term Goals: Week 1:  PT Short Term Goal 1 (Week 1): STG = LTG due to ELOS  Skilled Therapeutic Interventions/Progress Updates:  Pt was seen bedside in the pm. Pt performed bed mobility with S and side rail with head of bed elevated. Pt performed multiple sit to stand and stand pivot transfers with rolling walker and S to contact guard. Pt ambulated 150 feet x 3 with rolling walker and contact guard with verbal cues. Utilized 1.5 lbs weights on B ankles to increase proprioceptive input. Utilized block practice for sit to stand transfers for LE strengthening and technique, 3 sets x 10 reps each. Pt left sitting up in bed with bed alarm and all needs within reach.   Therapy Documentation Precautions:  Precautions Precautions: Back, Fall Required Braces or Orthoses: Spinal Brace Spinal Brace: Lumbar corset, Applied in sitting position Restrictions Weight Bearing Restrictions Per Provider Order: No General:   Pain: Pt c/o 6/10 back pain.   Therapy/Group: Individual Therapy  Jaelene Garciagarcia G 12/10/2023, 3:48 PM

## 2023-12-10 NOTE — Progress Notes (Signed)
 Physical Therapy Session Note  Patient Details  Name: Alvon Nygaard MRN: [id number] Date of Birth: 1946-01-05  Today's Date: 12/10/2023 PT Individual Time: 0915-0955  PT Individual Time Calculation (min): 40 min  Short Term Goals: Week 1:  PT Short Term Goal 1 (Week 1): STG = LTG due to ELOS  Skilled Therapeutic Interventions/Progress Updates:  Chart reviewed and pt agreeable to therapy. Pt received semi-reclined in bed with 8/10 c/o pain in back 2/2 spasms. Session focused on bed mobility, functional transfers, and ambulation to promote safe home mobility and access. Pt initiated session with transfer to EOB and donning for UB and LB dressing and LSO all using S. Pt then completed SPT using CGA + RW. Pt then completed teeth brushing and grooming at sink using S + RW. Pt taken to therapy gym for time. Pt then completed blocked practice of amb ranging 150-244ft using CGA + RW. Pt noted to require increased VC for safe use of DME. Session education emphasized back precautions. At end of session, pt was left seated in recliner with alarm engaged, nurse call bell and all needs in reach.     Therapy Documentation Precautions:  Precautions Precautions: Back, Fall Required Braces or Orthoses: Spinal Brace Spinal Brace: Lumbar corset, Applied in sitting position Restrictions Weight Bearing Restrictions Per Provider Order: No General:      Therapy/Group: Individual Therapy   Warrick KANDICE Raspberry 12/10/2023, 11:54 AM

## 2023-12-10 NOTE — Progress Notes (Signed)
 Patient reports pain and spasms intermediately during weekend shifts. This RN has observed patient not following back precautions, especially while in bed. He does not like to sit in the wheelchair or recliner for short or long periods of time, will request to go back to bed after therapy sessions are completed. Patient has been educated on back precautions by nursing staff and therapy staff. Will continue to reinforce education.

## 2023-12-10 NOTE — Progress Notes (Signed)
 PROGRESS NOTE   Subjective/Complaints: No new complaints Having regular BM, does not feel he needs stool softners, will decrease colace to BID Discussed improved labs  Vitals are stable, tolerating therapies well today.  No further fatigue.  ROS: Denies fevers, chills, N/V, abdominal pain, constipation, diarrhea, SOB, cough, chest pain, new weakness or paraesthesias.  +RLE spasms, +hard of hearing   Objective:   No results found.  Recent Labs    12/09/23 0532 12/10/23 0602  WBC 5.4 4.8  HGB 10.2* 10.6*  HCT 29.9* 31.7*  PLT 242 249   Recent Labs    12/09/23 0532 12/10/23 0602  NA 137 138  K 4.3 4.2  CL 107 109  CO2 22 21*  GLUCOSE 120* 121*  BUN 31* 24*  CREATININE 1.36* 1.32*  CALCIUM  8.7* 8.7*    Intake/Output Summary (Last 24 hours) at 12/10/2023 1222 Last data filed at 12/10/2023 1004 Gross per 24 hour  Intake 600 ml  Output 1000 ml  Net -400 ml        Physical Exam: Vital Signs Blood pressure (!) 145/70, pulse 87, temperature 97.8 F (36.6 C), temperature source Oral, resp. rate 18, height 5' 8 (1.727 m), weight 88 kg, SpO2 98%. Constitutional: No apparent distress.  Lying in bed. In positive spirits HENT: No JVD. Neck Supple. Trachea midline. Atraumatic, normocephalic. Eyes: PERRLA. EOMI. Visual fields grossly intact.  + rolling of right eye during fixation intermittently--less frequent today  Cardiovascular: RRR, no murmurs/rub/gallops. No Edema. Peripheral pulses 2+  Respiratory: CTAB. No rales, rhonchi, or wheezing. On RA.  Abdomen: + bowel sounds, normoactive. No distention or tenderness.  Skin: C/D/I.  Surgical site with some surrounding erythema following pattern of taping/dressing--remains apparent -Surgical site well-approximated with Steri-Strips; no apparent drainage.  Some fluctuance on the superior and most inferior portions of the incision, without drainage, warmth, or  erythema.    MSK:      No apparent deformity.  PRIOR EXAMS: Neuro: Alert and oriented x 3. Normal insight and awareness. Intact Memory. Normal language and speech. Cranial nerve exam unremarkable.   MMT:  BUE 4+/5 prox to distal.  RLE 4-/5 HF, KE and 4+/5 ADF/PF. --Significantly improved LLE 4+/5 HF, KE and 4+ ADF/PF. --Significantly improved  Pt with mild proprioceptive deficits in LE's--improved   BLE  LE ataxia   DTR's hyperreflexic right lower extremity, hyporeflexic LLE --> slightly hyperreflexic in both, stable 8/17  No abnl resting tone.    Bilateral upper and lower extremity fine, intention tremor + Clonus on ankle jerk R >L   Assessment/Plan: 1. Functional deficits which require 3+ hours per day of interdisciplinary therapy in a comprehensive inpatient rehab setting. Physiatrist is providing close team supervision and 24 hour management of active medical problems listed below. Physiatrist and rehab team continue to assess barriers to discharge/monitor patient progress toward functional and medical goals  Care Tool:  Bathing    Body parts bathed by patient: Right arm, Left arm, Chest, Abdomen, Front perineal area, Right upper leg, Left upper leg, Face   Body parts bathed by helper: Right lower leg, Left lower leg, Buttocks     Bathing assist Assist Level: Minimal Assistance - Patient >  75%     Upper Body Dressing/Undressing Upper body dressing   What is the patient wearing?: Pull over shirt, Orthosis    Upper body assist Assist Level: Moderate Assistance - Patient 50 - 74%    Lower Body Dressing/Undressing Lower body dressing      What is the patient wearing?: Underwear/pull up, Pants     Lower body assist Assist for lower body dressing: Moderate Assistance - Patient 50 - 74%     Toileting Toileting    Toileting assist Assist for toileting: Minimal Assistance - Patient > 75%     Transfers Chair/bed transfer  Transfers assist     Chair/bed  transfer assist level: Minimal Assistance - Patient > 75%     Locomotion Ambulation   Ambulation assist      Assist level: Minimal Assistance - Patient > 75% Assistive device: Walker-rolling Max distance: 125'   Walk 10 feet activity   Assist     Assist level: Minimal Assistance - Patient > 75% Assistive device: Walker-rolling   Walk 50 feet activity   Assist    Assist level: Minimal Assistance - Patient > 75% Assistive device: Walker-rolling    Walk 150 feet activity   Assist Walk 150 feet activity did not occur: Safety/medical concerns (fatigue)         Walk 10 feet on uneven surface  activity   Assist Walk 10 feet on uneven surfaces activity did not occur: Safety/medical concerns         Wheelchair     Assist Is the patient using a wheelchair?: Yes Type of Wheelchair: Manual    Wheelchair assist level: Supervision/Verbal cueing Max wheelchair distance: 150'    Wheelchair 50 feet with 2 turns activity    Assist        Assist Level: Supervision/Verbal cueing   Wheelchair 150 feet activity     Assist      Assist Level: Supervision/Verbal cueing   Blood pressure (!) 145/70, pulse 87, temperature 97.8 F (36.6 C), temperature source Oral, resp. rate 18, height 5' 8 (1.727 m), weight 88 kg, SpO2 98%.  1. 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              -patient may shower if wound is covered             -ELOS/Goals: 11-13 days, mod I to supervision goals  - Stable to continue inpatient rehab   - 8-14: Developing hyperreflexia R lower extremity greater than left lower extremity; generalized fatigue; abnormal eye rolling movements on exam.  EEG today, strength and sensation in the lower extremities is consistent with prior exams so initial hyperreflexia was likely just postop.  Will monitor closely for  neurologic changes.   8-15: EEG negative, eye rolling less apparent today.  Is improving strength in bilateral lower extremities, going from post-op hyporeflexia and hyperreflexic bilaterally.  Therapy performance improving today as compared to yesterday. monitor closely for neurologic changes over the weekend--per therapies, he does a poor job of maintaining spinal precautions in bed.  2.  Antithrombotics: -DVT/anticoagulation:  Mechanical: Sequential compression devices, below knee Bilateral lower extremities             -antiplatelet therapy: N/A--colchicine  for gout  3. Pain Management: Tylenol  prn mild pain or oxycodone  prn severe pain             -encourage pre-treatment with oxycodone  prior to therapies  for better activity tolerance--tolerating today   - 8/13: Hx alcohol  abuse, severe neuropathic pain overlying radicular pain - will get B12, folate, B6 levels with next labs--B12 high, folate/B6 look okay   8-14: This like I do not switch her over to tizanidine  every 8 hours as needed for spasticity; she also had hallucinations with baclofen in the past--doing better with tizanidine   Kpad ordered  4. Mood/Behavior/Sleep: LCSW to follow for evaluation and support.              -antipsychotic agents: N/A  5. Neuropsych/cognition/Hx Wernicke's encephalopathy: This patient is capable of making decisions on his own behalf.   - Reviewed notes from [facility] neurology 10-2023; does not meet criteria for idiopathic Parkinson's at this time, but is undergoing current evaluation.  Has history of Wernicke's encephalopathy and was continued to drink at time of last exam.  - On thiamine , B12 supplement.  - 8/14: EEG for abnormal eye rolling movements on exam, fatigue; negative.  Talking with staff, was present on admission but is not as noticeable. 8-15: EEG negative.  Neuropsych noted some attention and memory deficits, but not profound I was clear demonstration of awareness of events.  6. Skin/Wound  Care: Monitor incision for healing.  -dressing changes as needed. -Routine pressure relief measures.  -8/14: Appears with adhesive allergy on back; dressings removed. surgical site appears stable. - 8-15: Some fluctuance on the top and bottom of surgical site - nontender, no drainage, notably improving neurologic exam. Monitor for now.   7. Fluids/Electrolytes/Nutrition: Monitor I/O. Check CMET in am.    8.  HTN: Monitor BP TID--on Amlodipine  and Avapro .   - Blood pressure stable on current regimen    12/10/2023    4:59 AM 12/09/2023    7:16 PM 12/09/2023    1:50 PM  Vitals with BMI  Systolic 145 140 865  Diastolic 70 76 72  Pulse 87 88 94    9.  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 on 08/07-->recheck labs in am  - 8-13: Stable creatinine, increased BUN on a.m. labs; encourage p.o. fluids, recheck in 2 days  Discussed that creatinine is improving  11.  Anemia: Hgb 11.5 on 08/07--> recheck cbc in am.  Of note, has needed multiple blood transfusions s/p fall May of this year.  - 8-13: 10.5, within limits of variability.  Endorses no bleeding.  Trend, get iron studies, vitamin C, folate, B12 with next labs.   8/14: Fe low IDA despite supplement TID - in setting of CKD - may need venofer infusion if downtrends  Discussed that Hgb is trending upward  9.  H/o Gout: continue colchicine .   10. Hypothyroid: continue synthroid  for supplement.   11. Anxiety/alcohol  abuse: continue Klonopin  at bedtime prn. - Provide education  12. Hx bladder cancer: Treated with TURBT/infusion by Dr. Devere             -see #9             -monitor voiding patterns.             -I/O caths prn - voiding continent  13. Neurogenic bowel/constipation   Senakot changed to 2 Senna HS  Colace decreased to BID  LOS: 5 days A FACE TO FACE EVALUATION WAS PERFORMED  Sven P Mecca Guitron 12/10/2023, 12:22 PM

## 2023-12-10 NOTE — Progress Notes (Signed)
 Speech Language Pathology Daily Session Note  Patient Details  Name: Miguel Williams MRN: [id number] Date of Birth: January 02, 1946  Today's Date: 12/10/2023 SLP Individual Time: 0830-0915 SLP Individual Time Calculation (min): 45 min  Short Term Goals: Week 1: SLP Short Term Goal 1 (Week 1): STGs=LTGs d/t ELOS  Skilled Therapeutic Interventions: Pt seen in room for tx. SKilled intervnetion focused on cognition. Pt able to state a recommendation given by OT for shower safety. He  required cues to increase reasoning for purpose and fall prevention.Min cues needed for insight into pupose of consistency with use of calendar post hospital stay. Pt will  have in home care to manage medications. He was unable to recall any medications reviewed in previous session and state dhe had no desire to review these medications today.Pt given list of cognitive apps and activities to use once home independently. He was shown reminders app to use to keep trrack of important upcoming events. Pt state dhe preferes his ipad for keeing tracking of dates instead of paper calendar. Recommed session to review setting appointments in calendar on ipad next session and going back to review calendar. Cont therapy per plan of care.   Pain Pain Assessment Pain Scale: Faces Pain Score: 3  Faces Pain Scale: No hurt Pain Type: Acute pain Pain Location: Leg Pain Orientation: Right Pain Descriptors / Indicators: Sore Pain Frequency: Constant Pain Onset: On-going Patients Stated Pain Goal: 0 Pain Intervention(s): Repositioned  Therapy/Group: Individual Therapy  Recardo LABOR Denicia Pagliarulo 12/10/2023, 9:14 AM

## 2023-12-10 NOTE — Progress Notes (Signed)
 Occupational Therapy Session Note  Patient Details  Name: Miguel Williams MRN: [id number] Date of Birth: 07/02/1945  Today's Date: 12/10/2023 OT Individual Time: 1300-1355 OT Individual Time Calculation (min): 55 min    Short Term Goals: Week 1:  OT Short Term Goal 1 (Week 1): LTG=STG  Skilled Therapeutic Interventions/Progress Updates:      Therapy Documentation Precautions:  Precautions Precautions: Back, Fall Required Braces or Orthoses: Spinal Brace Spinal Brace: Lumbar corset, Applied in sitting position Restrictions Weight Bearing Restrictions Per Provider Order: No General: Pt supine in bed upon OT arrival, agreeable to OT session.  Pain:  unrated pain reported in low back, activity, intermittent rest breaks, distractions provided for pain management, pt reports tolerable to proceed.   ADL: OT providing skilled intervention on ADL retraining in order to increase independence with tasks and increase activity tolerance. Pt completed the following tasks at the current level of assist: Bed mobility: SBA supine><EOB with  UB dressing: SBA to don LSO seated EOB, OT making some adjustments Transfers: CGA ambulating throughout session, pt with 2 noted instances of knee buckling on Lt side with Min A to correct when ambulating back to room  Balance: Pt completed a variety of dynamic standing activities in order to promote increased balance strategies with ADL participation. Pt completed all activities at standing level  with intermittent supported standing at RW. Pt completed exercises listed below: -sit to stands with increased glute activation with theraband wrapped on thighs -step over cones with RLE then LLB with resistance band, CGA-Min to complete  Pt supine in bed with bed alarm activated, 2 bed rails up, call light within reach and 4Ps assessed.   Therapy/Group: Individual Therapy  Camie Hoe, OTD, OTR/L 12/10/2023, 12:53 PM

## 2023-12-11 DIAGNOSIS — M48062 Spinal stenosis, lumbar region with neurogenic claudication: Secondary | ICD-10-CM | POA: Diagnosis not present

## 2023-12-11 LAB — VITAMIN B6: Vitamin B6: 5.4 ug/L (ref 3.4–65.2)

## 2023-12-11 LAB — CBC
HCT: 32.1 % — ABNORMAL LOW (ref 39.0–52.0)
Hemoglobin: 10.7 g/dL — ABNORMAL LOW (ref 13.0–17.0)
MCH: 30.9 pg (ref 26.0–34.0)
MCHC: 33.3 g/dL (ref 30.0–36.0)
MCV: 92.8 fL (ref 80.0–100.0)
Platelets: 277 K/uL (ref 150–400)
RBC: 3.46 MIL/uL — ABNORMAL LOW (ref 4.22–5.81)
RDW: 13.2 % (ref 11.5–15.5)
WBC: 5.1 K/uL (ref 4.0–10.5)
nRBC: 0 % (ref 0.0–0.2)

## 2023-12-11 LAB — BASIC METABOLIC PANEL WITH GFR
Anion gap: 8 (ref 5–15)
BUN: 22 mg/dL (ref 8–23)
CO2: 23 mmol/L (ref 22–32)
Calcium: 8.8 mg/dL — ABNORMAL LOW (ref 8.9–10.3)
Chloride: 109 mmol/L (ref 98–111)
Creatinine, Ser: 1.41 mg/dL — ABNORMAL HIGH (ref 0.61–1.24)
GFR, Estimated: 51 mL/min — ABNORMAL LOW (ref >60)
Glucose, Bld: 120 mg/dL — ABNORMAL HIGH (ref 70–99)
Potassium: 4.9 mmol/L (ref 3.5–5.1)
Sodium: 140 mmol/L (ref 135–145)

## 2023-12-11 NOTE — Plan of Care (Signed)
  Problem: RH Balance Goal: LTG Patient will maintain dynamic standing with ADLs (OT) Description: LTG:  Patient will maintain dynamic standing balance with assist during activities of daily living (OT)  Flowsheets (Taken 12/11/2023 0740) LTG: Pt will maintain dynamic standing balance during ADLs with: Supervision/Verbal cueing   Problem: Sit to Stand Goal: LTG:  Patient will perform sit to stand in prep for activites of daily living with assistance level (OT) Description: LTG:  Patient will perform sit to stand in prep for activites of daily living with assistance level (OT) Flowsheets (Taken 12/11/2023 0740) LTG: PT will perform sit to stand in prep for activites of daily living with assistance level: Supervision/Verbal cueing   Problem: RH Bathing Goal: LTG Patient will bathe all body parts with assist levels (OT) Description: LTG: Patient will bathe all body parts with assist levels (OT) Flowsheets (Taken 12/11/2023 0740) LTG: Pt will perform bathing with assistance level/cueing: Supervision/Verbal cueing   Problem: RH Dressing Goal: LTG Patient will perform upper body dressing (OT) Description: LTG Patient will perform upper body dressing with assist, with/without cues (OT). Flowsheets (Taken 12/11/2023 0740) LTG: Pt will perform upper body dressing with assistance level of: Supervision/Verbal cueing Goal: LTG Patient will perform lower body dressing w/assist (OT) Description: LTG: Patient will perform lower body dressing with assist, with/without cues in positioning using equipment (OT) Flowsheets (Taken 12/11/2023 0740) LTG: Pt will perform lower body dressing with assistance level of: Supervision/Verbal cueing   Problem: RH Toileting Goal: LTG Patient will perform toileting task (3/3 steps) with assistance level (OT) Description: LTG: Patient will perform toileting task (3/3 steps) with assistance level (OT)  Flowsheets (Taken 12/11/2023 0740) LTG: Pt will perform toileting task  (3/3 steps) with assistance level: Supervision/Verbal cueing   Problem: RH Simple Meal Prep Goal: LTG Patient will perform simple meal prep w/assist (OT) Description: LTG: Patient will perform simple meal prep with assistance, with/without cues (OT). Outcome: Not Applicable   Problem: RH Toilet Transfers Goal: LTG Patient will perform toilet transfers w/assist (OT) Description: LTG: Patient will perform toilet transfers with assist, with/without cues using equipment (OT) Flowsheets (Taken 12/11/2023 0740) LTG: Pt will perform toilet transfers with assistance level of: Supervision/Verbal cueing   Problem: RH Tub/Shower Transfers Goal: LTG Patient will perform tub/shower transfers w/assist (OT) Description: LTG: Patient will perform tub/shower transfers with assist, with/without cues using equipment (OT) Flowsheets (Taken 12/11/2023 0740) LTG: Pt will perform tub/shower stall transfers with assistance level of: Supervision/Verbal cueing

## 2023-12-11 NOTE — Progress Notes (Signed)
 PROGRESS NOTE   Subjective/Complaints: No events overnight.  Patient complaining of aching pain in his bilateral lower extremities, was able to get tizanidine  overnight but not this a.m.  Vitals stable     12/11/2023    4:13 AM 12/10/2023    7:18 PM 12/10/2023    3:01 PM  Vitals with BMI  Systolic 107 132 868  Diastolic 48 81 82  Pulse 71 87 94   P.o. intakes appropriate  Continent of bladder  Last bowel movement 8-16, medium  ROS: Denies fevers, chills, N/V, abdominal pain, constipation, diarrhea, SOB, cough, chest pain, new weakness or paraesthesias.   +RLE spasms -now on bilateral lower extremities +hard of hearing   Objective:   No results found.  Recent Labs    12/10/23 0602 12/11/23 0618  WBC 4.8 5.1  HGB 10.6* 10.7*  HCT 31.7* 32.1*  PLT 249 277   Recent Labs    12/10/23 0602 12/11/23 0618  NA 138 140  K 4.2 4.9  CL 109 109  CO2 21* 23  GLUCOSE 121* 120*  BUN 24* 22  CREATININE 1.32* 1.41*  CALCIUM  8.7* 8.8*    Intake/Output Summary (Last 24 hours) at 12/11/2023 0820 Last data filed at 12/11/2023 0412 Gross per 24 hour  Intake 1200 ml  Output 1100 ml  Net 100 ml        Physical Exam: Vital Signs Blood pressure (!) 107/48, pulse 71, temperature 98 F (36.7 C), resp. rate 18, height 5' 8 (1.727 m), weight 88 kg, SpO2 95%.  Constitutional: No apparent distress.  Sitting up in bedside chair. HENT: No JVD. Neck Supple. Trachea midline. Atraumatic, normocephalic. Eyes: PERRLA. EOMI. Visual fields grossly intact.  + rolling of right eye during fixation intermittently--ongoing  Cardiovascular: RRR, no murmurs/rub/gallops. No Edema. Peripheral pulses 2+  Respiratory: CTAB. No rales, rhonchi, or wheezing. On RA.  Abdomen: + bowel sounds, normoactive. No distention or tenderness.  Skin: C/D/I.   -Surgical site well-approximated with Steri-Strips; no apparent drainage.  Increasing fluctuance  on the superior and most inferior portions of the incision, without pain, drainage, warmth, or erythema.    MSK:      No apparent deformity.    Neuro: Alert and oriented x 3. Normal insight and awareness. Intact Memory--some moderate cognitive deficits.  Normal language and speech. Cranial nerve exam unremarkable.   MMT:  BUE 5/5 prox to distal.  RLE 4-/5 HF, KE and 5/5 ADF/PF.   LLE 4+/5 HF, KE and 5/5 ADF/PF.    Pt with mild proprioceptive deficits in LE's--improved   BLE  LE ataxia   DTR's hyperreflexic in both patella, Achilles. + Clonus on ankle jerk R >L --decreasing from last exam  No abnl resting tone.    Bilateral upper and lower extremity fine, intention tremor   Assessment/Plan: 1. Functional deficits which require 3+ hours per day of interdisciplinary therapy in a comprehensive inpatient rehab setting. Physiatrist is providing close team supervision and 24 hour management of active medical problems listed below. Physiatrist and rehab team continue to assess barriers to discharge/monitor patient progress toward functional and medical goals  Care Tool:  Bathing    Body parts bathed by patient:  Right arm, Left arm, Chest, Abdomen, Front perineal area, Right upper leg, Left upper leg, Face   Body parts bathed by helper: Right lower leg, Left lower leg, Buttocks     Bathing assist Assist Level: Minimal Assistance - Patient > 75%     Upper Body Dressing/Undressing Upper body dressing   What is the patient wearing?: Pull over shirt, Orthosis    Upper body assist Assist Level: Moderate Assistance - Patient 50 - 74%    Lower Body Dressing/Undressing Lower body dressing      What is the patient wearing?: Underwear/pull up, Pants     Lower body assist Assist for lower body dressing: Moderate Assistance - Patient 50 - 74%     Toileting Toileting    Toileting assist Assist for toileting: Minimal Assistance - Patient > 75%     Transfers Chair/bed  transfer  Transfers assist     Chair/bed transfer assist level: Contact Guard/Touching assist     Locomotion Ambulation   Ambulation assist      Assist level: Contact Guard/Touching assist Assistive device: Walker-rolling Max distance: 150   Walk 10 feet activity   Assist     Assist level: Contact Guard/Touching assist Assistive device: Walker-rolling   Walk 50 feet activity   Assist    Assist level: Contact Guard/Touching assist Assistive device: Walker-rolling    Walk 150 feet activity   Assist Walk 150 feet activity did not occur: Safety/medical concerns (fatigue)  Assist level: Contact Guard/Touching assist Assistive device: Walker-rolling    Walk 10 feet on uneven surface  activity   Assist Walk 10 feet on uneven surfaces activity did not occur: Safety/medical concerns         Wheelchair     Assist Is the patient using a wheelchair?: Yes Type of Wheelchair: Manual    Wheelchair assist level: Supervision/Verbal cueing Max wheelchair distance: 150'    Wheelchair 50 feet with 2 turns activity    Assist        Assist Level: Supervision/Verbal cueing   Wheelchair 150 feet activity     Assist      Assist Level: Supervision/Verbal cueing   Blood pressure (!) 107/48, pulse 71, temperature 98 F (36.7 C), resp. rate 18, height 5' 8 (1.727 m), weight 88 kg, SpO2 95%.  1. 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              -patient may shower if wound is covered             -ELOS/Goals: 11-13 days, mod I to supervision goals  - Stable to continue inpatient rehab   - Teams tomorrow   - 8-14: Developing hyperreflexia R lower extremity greater than left lower extremity; generalized fatigue; abnormal eye rolling movements on exam.  EEG today, strength and sensation in the lower extremities is consistent  with prior exams so initial hyperreflexia was likely just postop.  Will monitor closely for neurologic changes.   8-15: EEG negative, eye rolling less apparent today.  Is improving strength in bilateral lower extremities, going from post-op hyporeflexia and hyperreflexic bilaterally.  Therapy performance improving today as compared to yesterday. monitor closely for neurologic changes over the weekend--per therapies, he does a poor job of maintaining spinal precautions in bed.  2.  Antithrombotics: -DVT/anticoagulation:  Mechanical: Sequential compression devices, below knee Bilateral lower extremities             -  antiplatelet therapy: N/A--colchicine  for gout  3. Pain Management: Tylenol  prn mild pain or oxycodone  prn severe pain             -encourage pre-treatment with oxycodone  prior to therapies for better activity tolerance--tolerating today   - 8/13: Hx alcohol  abuse, severe neuropathic pain overlying radicular pain - will get B12, folate, B6 levels with next labs--B12 high, folate/B6 look okay--DC B12   8-14:  switch her over to tizanidine  every 8 hours as needed for spasticity; he also had hallucinations with baclofen in the past--doing better with tizanidine   Kpad ordered   - 8-18: Reduce oxycodone  to 5 mg 3 times daily as needed for severe pain, increase Tylenol  to 500 to 1000 mg 3 times daily as needed for mild to moderate pain  4. Mood/Behavior/Sleep: LCSW to follow for evaluation and support.              -antipsychotic agents: N/A  5. Neuropsych/cognition/Hx Wernicke's encephalopathy: This patient is capable of making decisions on his own behalf.   - Reviewed notes from [facility] neurology 10-2023; does not meet criteria for idiopathic Parkinson's at this time, but is undergoing current evaluation.  Has history of Wernicke's encephalopathy and was continued to drink at time of last exam.  - On thiamine , B12 supplement.  - 8/14: EEG for abnormal eye rolling movements on exam,  fatigue; negative.  Talking with staff, was present on admission but is not as noticeable. 8-15: EEG negative.  Neuropsych noted some attention and memory deficits, but not profound.    6. Skin/Wound Care: Monitor incision for healing.  -dressing changes as needed. -Routine pressure relief measures.  -8/14: Appears with adhesive allergy on back; dressings removed. surgical site appears stable. - 8-15: Some fluctuance on the top and bottom of surgical site - nontender, no drainage, notably improving neurologic exam. Monitor for now.  8-18: Significant increase in fluctuance from last week, informed neurosurgery PA due to concern for reaccumulating seroma; continuing to make therapy progress  7. Fluids/Electrolytes/Nutrition: Monitor I/O. Check CMET in am.    8.  HTN: Monitor BP TID--on Amlodipine  and Avapro .   - Blood pressure stable on current regimen    12/11/2023    4:13 AM 12/10/2023    7:18 PM 12/10/2023    3:01 PM  Vitals with BMI  Systolic 107 132 868  Diastolic 48 81 82  Pulse 71 87 94    9.  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 on 08/07-->recheck labs in am  - 8-13: Stable creatinine, increased BUN on a.m. labs; encourage p.o. fluids, recheck in 2 days  8-18: Creatinine 1.  4, BUN improved, monitor  11.  Anemia: Hgb 11.5 on 08/07--> recheck cbc in am.  Of note, has needed multiple blood transfusions s/p fall May of this year.  - 8-13: 10.5, within limits of variability.  Endorses no bleeding.  Trend, get iron studies, vitamin C, folate, B12 with next labs.   8/14: Fe low IDA despite supplement TID - in setting of CKD - may need venofer infusion if downtrends  8-18: Hemoglobin stable  9.  H/o Gout: continue colchicine .   10. Hypothyroid: continue synthroid  for supplement.   11. Anxiety/alcohol  abuse: continue Klonopin  at bedtime prn. - Provide education  12. Hx bladder cancer: Treated with  TURBT/infusion by Dr. Devere             -see #9             -  monitor voiding patterns.             -I/O caths prn - voiding continent  13. Neurogenic bowel/constipation   [place]ot changed to 2 Senna HS  Colace decreased to BID  Last bowel movement 8-16, medium  LOS: 6 days A FACE TO FACE EVALUATION WAS PERFORMED  Miguel Williams 12/11/2023, 8:20 AM

## 2023-12-11 NOTE — Progress Notes (Signed)
 Patient seen and examined. Wound looks good, little boggy and minimal fluid under incision, does not look concerning. The patient is much better than preop, ok to be discharged tomorrow from our perspective

## 2023-12-11 NOTE — Progress Notes (Signed)
 Occupational Therapy Session Note  Patient Details  Name: Miguel Williams MRN: [id number] Date of Birth: 10-Oct-1945  Today's Date: 12/11/2023 OT Individual Time: 1105-1200 OT Individual Time Calculation (min): 55 min   Today's Date: 12/11/2023 OT Individual Time: 1420-1445 OT Individual Time Calculation (min): 25 min   Short Term Goals: Week 1:  OT Short Term Goal 1 (Week 1): LTG=STG  Skilled Therapeutic Interventions/Progress Updates:   Session 1: Pt greeted resting in bed for skilled OT session with focus on BADL retraining and functional transfers/mobility.   Pain: Pt with un-rated surgical pain, OT offering intermediate rest breaks and positioning suggestions throughout session to address pain/fatigue and maximize participation/safety in session.   Functional Transfers: Re-educated on Log Roll technique for bed mobility, pt completes at supervision level. Sit<>stands with supervision, and ambulatory bathroom transfers at [facility] + RW due to increased impulsivity/decreased safety with RW. Mobility from room>nurses station, ~60 ft in similar fashion.   Self Care Tasks: Pt completes the following self care tasks with levels of assistance noted below, UB: Bathing/dressing with setup/supervision. Assistance provided for LSO management to avoid twisting.  LB: Reacher utilized for unthreading LB garments, standing lowering/hike with CGA-Min A for standing balance. Pt able to thread BLE into garments with use of figure-4 technique. Use of long-handled sponge for B distal LE bathing and pericare while seated on BSC.    Therapeutic Activities: Pt instructed in table-top activity with BUE supported on table-edge vs AD for standing balance challenge and BLE strengthening. Pt with increased fear of falling, limiting performance on tasks, average 2x1 min cycles of activity.   Pt remained resting in bed with 4Ps assessed and immediate needs met. Pt continues to be appropriate for skilled OT  intervention to promote further functional independence in ADLs/IADLs.    Session 2: Pt greeted resting in bed for skilled OT session with focus on functional transfers/mobility and BLE strengthening for carryover into ADL participation.   Pain: Pt with un-rated pain at surgical site, OT offering intermediate rest breaks and positioning suggestions throughout session to address pain/fatigue and maximize participation/safety in session.   Functional Transfers: Sit<>stands with supervision, ambulatory transfers with CGA + RW. Pt demo's BLE buckling (RLE>LLE) with fatigue, corrected with light Min A.  Therapeutic Exercise: Pt instructed in series of BLE strengthening exercises, details below: LAQ Leg extensions Hip adductions  Pt completes 2x10 reps of the above with multimodal cuing for correct form/posture.   Pt remained resting in bed with 4Ps assessed and immediate needs met. Pt continues to be appropriate for skilled OT intervention to promote further functional independence in ADLs/IADLs.    Therapy Documentation Precautions:  Precautions Precautions: Back, Fall Required Braces or Orthoses: Spinal Brace Spinal Brace: Lumbar corset, Applied in sitting position Restrictions Weight Bearing Restrictions Per Provider Order: No   Therapy/Group: Individual Therapy  Nereida Habermann, OTR/L, MSOT  12/11/2023, 5:30 AM

## 2023-12-11 NOTE — Progress Notes (Signed)
 Physical Therapy Session Note  Patient Details  Name: Miguel Williams MRN: [id number] Date of Birth: October 15, 1945  Today's Date: 12/11/2023 PT Individual Time: 0805-0900 PT Individual Time Calculation (min): 55 min   Short Term Goals: Week 1:  PT Short Term Goal 1 (Week 1): STG = LTG due to ELOS  Skilled Therapeutic Interventions/Progress Updates:    Pt presents in room seated in [facility] mobilizing in room, requesting urgently to use restroom. Therapist assures safety then assists to bathroom with RW CGA, cues for RW management with impulsivity ambulating into bathroom, requiring CGA for transfer to toilet. Pt continent of urine, charted. Pt ambulates to WC and returns to sitting. Session focused on therapeutic activities for transfer training, standing tolerance, activity tolerance with emphasis on decreasing pain with mobility for improved tolerance, as well as gait training with RW with cues for posture and safety with RW management. Pt transported to day room dependently. Pt ambulates with RW 175' with CGA, demonstrating improved upright posture initially however with bilateral knee flexion with fatigue, cues for upright posture and proximity to RW. Pt requesting to sit urgently while ambulating however no sitting surface, pt able to continue to ambulate safely to [facility] to sit with cues. Pt reporting soreness in anterior lower limbs, reporting muscle soreness with therapist educating on activity completing yesterday and recovery time. Pt ambulates to nustep and completes continuous activity on nustep BUE/BLE 8 minutes on L3 resistance, completed to promote improved activity tolerance as well as decrease muscle soreness. Pt then completes standing at standing table while playing connect 4 to improve standing tolerance with pt able to stand 6 trials with each stand lasting between 38 seconds and 56 seconds. Pt provided with cues to attempt to increase standing time. Pt returns to room and completes stand step  transfer without UE support with CGA. Pt completes bed mobility with mod cues for log roll with min assist, demonstrating BLE muscle spasm with return to supine. Pt remains semi reclined with all needs within reach, cal light in place and bed alarm activated at end of session.   Therapy Documentation Precautions:  Precautions Precautions: Back, Fall Required Braces or Orthoses: Spinal Brace Spinal Brace: Lumbar corset, Applied in sitting position Restrictions Weight Bearing Restrictions Per Provider Order: No    Therapy/Group: Individual Therapy  Reche Ohara PT, DPT 12/11/2023, 4:05 PM

## 2023-12-11 NOTE — Progress Notes (Signed)
 Speech Language Pathology Daily Session Note  Patient Details  Name: Miguel Williams MRN: [id number] Date of Birth: 1946/03/11  Today's Date: 12/11/2023 SLP Individual Time: 1001-1057 SLP Individual Time Calculation (min): 56 min  Short Term Goals: Week 1: SLP Short Term Goal 1 (Week 1): STGs=LTGs d/t ELOS  Skilled Therapeutic Interventions: Skilled therapy session focused on cognitive goals. SLP facilitated session by educating patient on WRAP (write, repeat, associate, picture) memory strategies. SLP then encouraged use of strategies through paragraph retention task. Patient answered questions re: paragraph after a 10 minute delay with 90% accuracy. At the end of the session, patient required 4/4 strategies independently. Patient left in bed with alarm set and call bell in reach. Continue POC.  Pain 4/10 pain in back   Therapy/Group: Individual Therapy  Lynetta Tomczak M.A., CCC-SLP 12/11/2023, 7:46 AM

## 2023-12-12 ENCOUNTER — Inpatient Hospital Stay (HOSPITAL_COMMUNITY)

## 2023-12-12 ENCOUNTER — Encounter (HOSPITAL_COMMUNITY): Payer: Self-pay | Admitting: Radiology

## 2023-12-12 DIAGNOSIS — M48062 Spinal stenosis, lumbar region with neurogenic claudication: Secondary | ICD-10-CM | POA: Diagnosis not present

## 2023-12-12 LAB — URINALYSIS, W/ REFLEX TO CULTURE (INFECTION SUSPECTED)
Bilirubin Urine: NEGATIVE
Glucose, UA: NEGATIVE mg/dL
Hgb urine dipstick: NEGATIVE
Ketones, ur: NEGATIVE mg/dL
Leukocytes,Ua: NEGATIVE
Nitrite: NEGATIVE
Protein, ur: NEGATIVE mg/dL
Specific Gravity, Urine: 1.012 (ref 1.005–1.030)
pH: 5 (ref 5.0–8.0)

## 2023-12-12 LAB — VITAMIN C: Vitamin C: 0.3 mg/dL — ABNORMAL LOW (ref 0.4–2.0)

## 2023-12-12 MED ORDER — ACETAMINOPHEN 500 MG PO TABS
1000.0000 mg | ORAL_TABLET | Freq: Three times a day (TID) | ORAL | Status: DC
  Administered 2023-12-12 – 2023-12-20 (×24): 1000 mg via ORAL
  Filled 2023-12-12 (×24): qty 2

## 2023-12-12 MED ORDER — OXYCODONE HCL 5 MG PO TABS
5.0000 mg | ORAL_TABLET | Freq: Three times a day (TID) | ORAL | Status: DC | PRN
  Administered 2023-12-12 – 2023-12-20 (×12): 5 mg via ORAL
  Filled 2023-12-12 (×13): qty 1

## 2023-12-12 MED ORDER — POLYETHYLENE GLYCOL 3350 17 G PO PACK
17.0000 g | PACK | Freq: Every day | ORAL | Status: DC | PRN
  Filled 2023-12-12 (×2): qty 1

## 2023-12-12 MED ORDER — ACETAMINOPHEN 500 MG PO TABS: 500.0000 mg | ORAL_TABLET | Freq: Three times a day (TID) | ORAL | Status: DC | PRN

## 2023-12-12 MED ORDER — DANTROLENE SODIUM 25 MG PO CAPS
25.0000 mg | ORAL_CAPSULE | Freq: Every day | ORAL | Status: DC
  Administered 2023-12-12 – 2023-12-16 (×5): 25 mg via ORAL
  Filled 2023-12-12 (×5): qty 1

## 2023-12-12 NOTE — Progress Notes (Signed)
 Occupational Therapy Weekly Progress Note  Patient Details  Name: Miguel Williams MRN: [id number] Date of Birth: 1945/08/21  Beginning of progress report period: December 06, 2023 End of progress report period: December 12, 2023  Today's Date: 12/12/2023 OT Individual Time: 8692-8651 OT Individual Time Calculation (min): 41 min    Patient continues to make progress towards OT LTGs. Pt currently requires setup/supervision for UB care, not including Min-Mod A for LSO management to prevent twisting. Pt requires consistent Mod-Max cuing for precaution adherence during self care, but implements AE such as long-handled sponge and reacher with Min-Mod cuing for LB care. Pt manages standing LB care with CGA-Min A for standing balance. Pt continues to demonstrate decreased safety awareness as noted above, alongside increased impulsivity. Caregiver education scheduled for this date with paid care provider.   Patient continues to demonstrate the following deficits: muscle weakness and muscle joint tightness, decreased cardiorespiratoy endurance, unbalanced muscle activation and decreased coordination, decreased awareness, decreased safety awareness, and decreased memory, and decreased standing balance and decreased balance strategies and therefore will continue to benefit from skilled OT intervention to enhance overall performance with BADL and Reduce care partner burden.  Patient not progressing toward long term goals.  See goal revision..  Plan of care revisions: 8/18 due to safety concerns.  OT Short Term Goals Week 1:  OT Short Term Goal 1 (Week 1): LTG=STG OT Short Term Goal 1 - Progress (Week 1): Progressing toward goal Week 2:  OT Short Term Goal 1 (Week 2): STGs=LTGs due to patient's estimated length of stay.  Skilled Therapeutic Interventions/Progress Updates:  Pt greeted resting in bed, paid caregiver [name] present for scheduled caregiver education.  Pain: Pt with no reports of pain, OT offering  intermediate rest breaks and positioning suggestions throughout session to address pain/fatigue and maximize participation/safety in session.   Education with focus on OT role, OT POC, and current patient functioning. Surgical precautions and LSO requirements reviewed, verbally instructed in donning process. Emphasis placed on use of AE (long-handled sponge & reacher) for BLE care, as well as, use of BSC for pericare access to avoid standing during showers. Pt performs simulated walk-in shower transfer with CGA, educated on potential placement of permanent grab bars. OT initiates discussion of need for extended caregiver assistance due to decreased implementation of surgical precautions into daily functioning, both in agreement. Recommended placement of landing spots throughout home environment due to intermediate presence of BLE buckling and impulsive need to sit down. MD rounding during session. All questions answered at end of session.    Pt remained sitting in WC, direct handoff to SLP, 4Ps assessed and immediate needs met. Pt continues to be appropriate for skilled OT intervention to promote further functional independence in ADLs/IADLs.   Therapy Documentation Precautions:  Precautions Precautions: Back, Fall Required Braces or Orthoses: Spinal Brace Spinal Brace: Lumbar corset, Applied in sitting position Restrictions Weight Bearing Restrictions Per Provider Order: No   Therapy/Group: Individual Therapy  Nereida Habermann, OTR/L, MSOT  12/12/2023, 5:22 AM

## 2023-12-12 NOTE — Patient Care Conference (Cosign Needed Addendum)
 Inpatient RehabilitationTeam Conference and Plan of Care Update Date: 12/12/2023   Time: 1038 am  Patient was admitted after the interdisciplinary team meeting took place on 12/05/2023; as a result, the first team conference occurred on day 8 of this patient's stay.     Patient Name: Miguel Williams      Medical Record Number: [id number]  Date of Birth: Sep 18, 1945 Sex: Male         Room/Bed: 4W01C/4W01C-01 Payor Info: Payor: [employer]vertising copy[employer] MEDICARE / Plan: [employer] MEDICARE / Product Type: *No Product type* /    Admit Date/Time:  12/05/2023  6:24 PM  Primary Diagnosis:  Neurogenic claudication due to lumbar spinal stenosis  Hospital Problems: Principal Problem:   Neurogenic claudication due to lumbar spinal stenosis Active Problems:   Lumbar polyradiculopathy   Depression with anxiety    Expected Discharge Date: Expected Discharge Date: 12/20/23  Team Members Present: Physician leading conference: Dr. Joesph Likes Social Worker Present: Graeme Jude, LCSW Nurse Present: Eulalio Falls, RN PT Present: Elam Ohara, PT OT Present: Nereida Habermann, OT SLP Present: Recardo Mole, SLP PPS Coordinator present : Eleanor Colon, SLP     Current Status/Progress Goal Weekly Team Focus  Bowel/Bladder   continent of bowel and bladder; last BM 12/09/22   remain continent of bowel and bladder   remain continent of bowel and bladder    Swallow/Nutrition/ Hydration               ADL's   Setup/supervision for UB care; CGA for standing LB ADLs. Demonstrates decreased carryover/implementation of surgical precautions. Decreased safety awareness (impulsive/restless). Barriers: Poor precautions adherence, decreased safety awareness, decreased standing balance, and decreased activity tolerance.   Downgraded from Mod I to supervision.   Functional transfers, standing balance/tolerance, general conditioning, continued precaution re-education.    Mobility   min assist bed mobility (poor  adherence to precautions), CGA transfers, CGA gait 175' with RW   supervision ambulatory  barriers: cog deficits (limited carryover), decreased insight into deficits; fatigue and spasms; focus on gait training, standing tolerance, NMR for dynamic standing balance    Communication                Safety/Cognition/ Behavioral Observations  supervision-minA   supervision   problem solving, memory, intellectual awareness    Pain   complained of pain x 1 this shift and medicated with prn oxycodone  per prn order   minimal to no pain   minimal to no pain    Skin   surgical site to remain free of infection   surgical site to remain free of infection  surgical site to remain free of infection and no further skin breakdown      Discharge Planning:  Plan is for pt to d/c to home with support from friend until about the first week of September and then will have intermittent support. Pt reports he has other friends that can assit with his care needs. SW will confirm there are no barriers to disharge,.    Team Discussion: Patient was admitted post decompression and fusion due to lumbar myelopathy.  Patient with mild high blood pressure/ bilateral lower extremities pain/neurogenic bowels: medication adjusted by MD. Patient's progress limited by generalized memory deficits, decreased carry over of surgical precautions, decrease safety awareness, impulsivity and decrease activity tolerance.  Patient on target to meet rehab goals: Currently patient requires setup assist with upper body care, CGA with lower body care and transfers. Patient was able to ambulate up to 175' CGA using a  rolling walker. Patient needs  supervision- min assistance with problem solving, memory and cognition. Overall goals at discharge are set for supervision assistance.   *See Care Plan and progress notes for long and short-term goals.   Revisions to Treatment Plan:  Neuro psych consult LSO brace  Teaching  Needs: Safety, medications, transfers, toileting, etc   Current Barriers to Discharge: Decreased caregiver support, Home enviroment access/layout, Neurogenic bowel and bladder, and Behavior  Possible Resolutions to Barriers: Family education Home health follow up  DME: Rolling Walker, BSC, W/C     Medical Summary Current Status: Medically complicated by lumbar myelopathy with postop course complicated by seroma status post washout, cognitive deficit, bilateral lower extremity spasms, neuropathic pain, CKD, and anemia  Barriers to Discharge: Behavior/Mood;Complicated Wound;Self-care education;Spasticity;Uncontrolled Pain;Medical stability   Possible Resolutions to [employer]: Continue monitoring wound site with neurosurgical assistance due to increasing fluctuance, monitor vitals and labs just to rule out infection, titrate pain antispasticity medications, monitor renal function   Continued Need for Acute Rehabilitation Level of Care: The patient requires daily medical management by a physician with specialized training in physical medicine and rehabilitation for the following reasons: Direction of a multidisciplinary physical rehabilitation program to maximize functional independence : Yes Medical management of patient stability for increased activity during participation in an intensive rehabilitation regime.: Yes Analysis of laboratory values and/or radiology reports with any subsequent need for medication adjustment and/or medical intervention. : Yes   I attest that I was present, lead the team conference, and concur with the assessment and plan of the team.   Tatia Petrucci Gayo 12/12/2023, 1038 am

## 2023-12-12 NOTE — Progress Notes (Signed)
 Patient ID: Miguel Williams, male   DOB: 05-28-1945, 78 y.o.   MRN: [id number]   SW met with pt and pt friend/aide Terry in room to provide updates from team conference, and d/c date 8/27. Preferred HHA- [facility]. Terry will confirm if she has a TTB. SW will order 3in1 BSC. SW will follow-up with updates.   Graeme Jude, MSW, LCSW Office: [phone] Cell: [phone] Fax: [phone]

## 2023-12-12 NOTE — Progress Notes (Signed)
 Speech Language Pathology Daily Session Note  Patient Details  Name: Miguel Williams MRN: [id number] Date of Birth: 1945/06/12  Today's Date: 12/12/2023 SLP Individual Time: 1348-1415 SLP Individual Time Calculation (min): 27 min  Short Term Goals: Week 1: SLP Short Term Goal 1 (Week 1): STGs=LTGs d/t ELOS  Skilled Therapeutic Interventions: SLP conducted skilled therapy session targeting cognitive goals. Patient's caregiver, [name], present for family education throughout session. SLP reviewed WRAP memory strategies which patient recalled with min assist. Patient then benefited from min assist to provide examples for each strategy and real life applications pertaining to home activities. SLP then reviewed patient's calendar application on iPad where patient benefited from supervision assist for accuracy and thoroughness. SLP and family also discussed medication organization strategies and strategies to insure consistency when taking medications daily. Patient was left in room with call bell in reach and alarm set. SLP will continue to target goals per plan of care.       Pain Pain Assessment Pain Scale: 0-10 Pain Score: 3  Pain Location: Back Pain Intervention(s): Medication (See eMAR)  Therapy/Group: Individual Therapy  Darrian Goodwill, M.A., CCC-SLP  [place] Dulay A Jasha Hodzic 12/12/2023, 3:07 PM

## 2023-12-12 NOTE — Progress Notes (Signed)
 PROGRESS NOTE   Subjective/Complaints: No events overnight.   Vital stable, some mild hypertension 130s to 140s.  Reporting 4-7 out of 10 pain in his bilateral lower extremities overnight.  Using as needed tizanidine  consistently.  Therapies report 3+ loose BM this AM; was after taking sorbitol .  Spoke with patient's home aide, Miguel Williams.  She endorses that he has significant cognitive changes since his hospitalization, including more frequent rolling of his eyes (was somewhat present before due to lens replacements) and generalized memory deficits.   ROS: Denies fevers, chills, N/V, abdominal pain, constipation, diarrhea, SOB, cough, chest pain, new weakness or paraesthesias.   + Bilateral lower extremity spasms    Objective:   No results found.  Recent Labs    12/10/23 0602 12/11/23 0618  WBC 4.8 5.1  HGB 10.6* 10.7*  HCT 31.7* 32.1*  PLT 249 277   Recent Labs    12/10/23 0602 12/11/23 0618  NA 138 140  K 4.2 4.9  CL 109 109  CO2 21* 23  GLUCOSE 121* 120*  BUN 24* 22  CREATININE 1.32* 1.41*  CALCIUM  8.7* 8.8*    Intake/Output Summary (Last 24 hours) at 12/12/2023 0935 Last data filed at 12/12/2023 0755 Gross per 24 hour  Intake --  Output 1250 ml  Net -1250 ml        Physical Exam: Vital Signs Blood pressure (!) 136/57, pulse 83, temperature 98.5 F (36.9 C), temperature source Oral, resp. rate 19, height 5' 8 (1.727 m), weight 88 kg, SpO2 92%.  Constitutional: No apparent distress.  Laying in bed. HENT: No JVD. Neck Supple. Trachea midline. Atraumatic, normocephalic. Eyes: PERRLA. EOMI. Visual fields grossly intact.  + rolling of right eye during fixation intermittently--ongoing  Cardiovascular: RRR, no murmurs/rub/gallops. No Edema. Peripheral pulses 2+  Respiratory: CTAB. No rales, rhonchi, or wheezing. On RA.  Abdomen: + bowel sounds, normoactive. No distention or tenderness.  Skin: C/D/I.    -Surgical site well-approximated with Steri-Strips; no apparent drainage.  Fluctuance stable from prior exams   MSK:      No apparent deformity.    Neuro: Alert and oriented x 3. Normal insight and awareness. Intact Memory--some moderate cognitive deficits.  Slight dysarthria.. Cranial nerve exam unremarkable.   MMT:  BUE 5/5 prox to distal.  RLE 4-/5 HF, KE and 5/5 ADF/PF.   LLE 4+/5 HF, KE and 5/5 ADF/PF.    Pt with mild proprioceptive deficits in LE's--improved   BLE  LE ataxia   DTR's hyperreflexic in both patella, Achilles.  + Clonus on ankle jerk R >L --> not present in left lower extremity today.  No abnl resting tone.    Bilateral upper and lower extremity fine, intention tremor--reducing   Assessment/Plan: 1. Functional deficits which require 3+ hours per day of interdisciplinary therapy in a comprehensive inpatient rehab setting. Physiatrist is providing close team supervision and 24 hour management of active medical problems listed below. Physiatrist and rehab team continue to assess barriers to discharge/monitor patient progress toward functional and medical goals  Care Tool:  Bathing    Body parts bathed by patient: Right arm, Left arm, Chest, Abdomen, Front perineal area, Right upper leg, Left upper leg,  Face   Body parts bathed by helper: Right lower leg, Left lower leg, Buttocks     Bathing assist Assist Level: Minimal Assistance - Patient > 75%     Upper Body Dressing/Undressing Upper body dressing   What is the patient wearing?: Pull over shirt, Orthosis    Upper body assist Assist Level: Moderate Assistance - Patient 50 - 74%    Lower Body Dressing/Undressing Lower body dressing      What is the patient wearing?: Underwear/pull up, Pants     Lower body assist Assist for lower body dressing: Moderate Assistance - Patient 50 - 74%     Toileting Toileting    Toileting assist Assist for toileting: Minimal Assistance - Patient > 75%      Transfers Chair/bed transfer  Transfers assist     Chair/bed transfer assist level: Contact Guard/Touching assist     Locomotion Ambulation   Ambulation assist      Assist level: Contact Guard/Touching assist Assistive device: Walker-rolling Max distance: 150   Walk 10 feet activity   Assist     Assist level: Contact Guard/Touching assist Assistive device: Walker-rolling   Walk 50 feet activity   Assist    Assist level: Contact Guard/Touching assist Assistive device: Walker-rolling    Walk 150 feet activity   Assist Walk 150 feet activity did not occur: Safety/medical concerns (fatigue)  Assist level: Contact Guard/Touching assist Assistive device: Walker-rolling    Walk 10 feet on uneven surface  activity   Assist Walk 10 feet on uneven surfaces activity did not occur: Safety/medical concerns         Wheelchair     Assist Is the patient using a wheelchair?: Yes Type of Wheelchair: Manual    Wheelchair assist level: Supervision/Verbal cueing Max wheelchair distance: 150'    Wheelchair 50 feet with 2 turns activity    Assist        Assist Level: Supervision/Verbal cueing   Wheelchair 150 feet activity     Assist      Assist Level: Supervision/Verbal cueing   Blood pressure (!) 136/57, pulse 83, temperature 98.5 F (36.9 C), temperature source Oral, resp. rate 19, height 5' 8 (1.727 m), weight 88 kg, SpO2 92%.  1. 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              -patient may shower if wound is covered             -ELOS/Goals: 11-13 days, mod I to supervision goals -- 8/27 DC  - Stable to continue inpatient rehab    - 8-14: Developing hyperreflexia R lower extremity greater than left lower extremity; generalized fatigue; abnormal eye rolling movements on exam.  EEG today, strength and sensation  in the lower extremities is consistent with prior exams so initial hyperreflexia was likely just postop.  Will monitor closely for neurologic changes.   8-15: EEG negative, eye rolling less apparent today.  Is improving strength in bilateral lower extremities, going from post-op hyporeflexia and hyperreflexic bilaterally.  Therapy performance improving today as compared to yesterday. monitor closely for neurologic changes over the weekend--per therapies, he does a poor job of maintaining spinal precautions in bed.   8/19: Caregiver support ?; uncertain of GF vs. Home nurse (2 weeks on discharge). Cog now improving to where he can repeat back precautions but too impulsive to follow them. CGA with gait 175 ft with RW; self-limiting.  SPV to Min A cog; difficulty transitioning from structured to functional tasks.   2.  Antithrombotics: -DVT/anticoagulation:  Mechanical: Sequential compression devices, below knee Bilateral lower extremities             -antiplatelet therapy: N/A--colchicine  for gout  3. Pain Management: Tylenol  prn mild pain or oxycodone  prn severe pain             -encourage pre-treatment with oxycodone  prior to therapies for better activity tolerance--tolerating today   - 8/13: Hx alcohol  abuse, severe neuropathic pain overlying radicular pain - will get B12, folate, B6 levels with next labs--B12 high, folate/B6 look okay--DC B12   8-14:  switch her over to tizanidine  every 8 hours as needed for spasticity; he also had hallucinations with baclofen in the past--doing better with tizanidine   Kpad ordered   - 8-18: Reduce oxycodone  to 5 mg 3 times daily as needed for severe pain, increase Tylenol  to 500 to 1000 mg 3 times daily as needed for mild to moderate pain    - 8/19: Schedule tylenol ; add dantrolene  25 mg daily for spasms, check LFTs in 1 week prior to increase.   - Reports by caregiver patient was taking hydrocodone  at home; will return to home regimen at discharge  4.  Mood/Behavior/Sleep: LCSW to follow for evaluation and support.              -antipsychotic agents: N/A  5. Neuropsych/cognition/Hx Wernicke's encephalopathy: This patient is capable of making decisions on his own behalf.   - Reviewed [facility] from [facility] neurology 10-2023; does not meet criteria for idiopathic Parkinson's at this time, but is undergoing current evaluation.  Has history of Wernicke's encephalopathy and was continued to drink at time of last exam.  - On thiamine , B12 supplement.  - 8/14: EEG for abnormal eye rolling movements on exam, fatigue; negative.  Talking with staff, was present on admission but is not as noticeable. 8-15: EEG negative.  Neuropsych noted some attention and memory deficits, but not profound.    - 8/19: Anticipate 100% supervision at home; discussed with the patient's caregiver, will get MRI given concerns for hitting his head during prior falls and possible overlying TBI--negative for acute changes, likely overlying concussion. Will f/u with Neuro as OP for cog deficits and eye movements  6. Skin/Wound Care: Monitor incision for healing.  -dressing changes as needed. -Routine pressure relief measures.  -8/14: Appears with adhesive allergy on back; dressings removed. surgical site appears stable. - 8-15: Some fluctuance on the top and bottom of surgical site - nontender, no drainage, notably improving neurologic exam. Monitor for now.  8-18: Significant increase in fluctuance from last week, informed neurosurgery PA due to concern for reaccumulating seroma; continuing to make therapy progress-- Appreciate Suzen Pean NP coming in, no concerning changes with wound  7. Fluids/Electrolytes/Nutrition: Monitor I/O. Check CMET in am.    8.  HTN: Monitor BP TID--on Amlodipine  and Avapro .   - Blood pressure stable on current regimen    12/12/2023    4:12 AM 12/11/2023    6:54 PM 12/11/2023    1:01 PM  Vitals with BMI  Systolic 136 147 893  Diastolic 57 71 64   Pulse 83 89 103    9.  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 on 08/07-->recheck labs in am  - 8-13: Stable creatinine, increased BUN on a.m. labs; encourage p.o. fluids, recheck in 2 days  8-18: Creatinine 1.41 , BUN improved, monitor  11.  Anemia: Hgb 11.5 on 08/07--> recheck cbc in am.  Of note, has needed multiple blood transfusions s/p fall May of this year.  - 8-13: 10.5, within limits of variability.  Endorses no bleeding.  Trend, get iron studies, vitamin C, folate, B12 with next labs.   8/14: Fe low IDA despite supplement TID - in setting of CKD - may need venofer infusion if downtrends  8-18: Hemoglobin stable  9.  H/o Gout: continue colchicine .   10. Hypothyroid: continue synthroid  for supplement.   11. Anxiety/alcohol  abuse: continue Klonopin  at bedtime prn. - Provide education - 8-18: Caregiver reports estimate drinking about a bottle of wine per night at home prior to admission.  12. Hx bladder cancer: Treated with TURBT/infusion by Dr. Devere             -see #9             -monitor voiding patterns.             -I/O caths prn - voiding continent  13. Neurogenic bowel/constipation   Senakot changed to 2 Senna HS  Colace decreased to BID  Last bowel movement 8-16, medium   - 8/19: Multiple BM this AM  - DC at bedtime sennakot, add PRN miralax  17 g daily  LOS: 7 days A FACE TO FACE EVALUATION WAS PERFORMED  Miguel Williams 12/12/2023, 9:35 AM

## 2023-12-12 NOTE — Progress Notes (Signed)
 Physical Therapy Session Note  Patient Details  Name: Miguel Williams MRN: [id number] Date of Birth: 12/02/1945  Today's Date: 12/12/2023 PT Individual Time: 0805-0910 + 1450-1531 PT Individual Time Calculation (min): 65 min + 41 min PT Missed Time: 10 Minutes Missed Time Reason: Other (Comment) (urgency of bowel)  Short Term Goals: Week 1:  PT Short Term Goal 1 (Week 1): STG = LTG due to ELOS   Skilled Therapeutic Interventions/Progress Updates:    SESSION 1: Pt presents in room in bed, agreeable to PT but reporting having had a lot of pain last night that's slowly improving since taking zanaflex  this AM. Session focused on therapeutic activities for bed mobility, transfer training, upright tolerance, and participation in self care tasks. Pt completes bed mobility via log roll with supervision for supine<>sit, no cues necessary! Pt completes transfers with CGA throughout session, continues to demonstrate impulsivity and decreased recognition of hand placement but improving carryover of positioning in front of WC prior to sitting. Pt transported to day room via WC, cues to maintain LAQ BLE during transport as warm up prior to gait. Pt then completes gait 2x175' with RW demonstrating improved proximity to RW and upright posture. Pt then requests to ambulate back to room to change shirt, ambulates ~150' x2 to/from room, takes seated rest break to change shirt, requires assist for managing LSO. Pt then requests to use restroom urgently, ambulates to restroom in day room with CGA and cues to decrease speed with urgency and position safely in front of toilet prior to sitting. Pt able to manage clothing and periarea hygiene with supervision and cues during session for toileting, does demonstrate attempting to ambulate prior to pulling up pants requiring cues for safety awareness. Pt returns to/from room twice for bowel urgency, upon third episode of bowel urgency pt then returns to bed, session terminated  due to limited participation with bowel urgency, missing 10 min of 75 min session. Pt remains semi reclined in bed with all needs within reach, call light in place and bed alarm activated at end of session.   SESSION 2: Pt presents in room in [facility] with care partner [name] present for family education. Pt denies pain at this time. Session focused on therapeutic activities with emphasis on education for safety at DC and DC planning with care partner educated on pt current level of function and assist needed. Pt completes transfers with CGA throughout session, cues for maintaining back precautions for turning to sit with RW. Pt ambulates 200' with RW CGA demonstrating improved upright posture minimal signs of fatiguing with ambulation. Pt completes up/down 4 steps with BHRs with CGA and cues for managing speed, completes reciprocal gait up stairs. Pt takes seated rest break, then completes up/down 5 curb with RW with mod cues for sequencing for increasing proximity to curb prior to placing RW on curb, completes x2 trials with CGA. Pt then transported to ortho gym, completes ambulatory transfer with RW with CGA with education on completing car transfer without breaking precautions. Pt returns to room and completes stand step transfer with RW with CGA to elevated bed to simulate home set up, provided with cues for scooting posteriorly on bed prior to completing log roll with pt able to complete with supervision. Pt remains semi reclined with all needs within reach, cal light in place and bed alarm activated at end of session.    Therapy Documentation Precautions:  Precautions Precautions: Back, Fall Required Braces or Orthoses: Spinal Brace Spinal Brace: Lumbar corset, Applied in  sitting position Restrictions Weight Bearing Restrictions Per Provider Order: No    Therapy/Group: Individual Therapy  Reche Ohara PT, DPT 12/12/2023, 9:17 AM

## 2023-12-13 ENCOUNTER — Other Ambulatory Visit

## 2023-12-13 DIAGNOSIS — M48062 Spinal stenosis, lumbar region with neurogenic claudication: Secondary | ICD-10-CM | POA: Diagnosis not present

## 2023-12-13 MED ORDER — PREGABALIN 50 MG PO CAPS: 50.0000 mg | ORAL_CAPSULE | Freq: Every day | ORAL | Status: DC

## 2023-12-13 MED ORDER — PREGABALIN 25 MG PO CAPS
25.0000 mg | ORAL_CAPSULE | Freq: Every day | ORAL | Status: DC
  Administered 2023-12-13 – 2023-12-14 (×2): 25 mg via ORAL
  Filled 2023-12-13 (×2): qty 1

## 2023-12-13 MED ORDER — PREGABALIN 50 MG PO CAPS
50.0000 mg | ORAL_CAPSULE | Freq: Every day | ORAL | Status: DC
  Administered 2023-12-13: 50 mg via ORAL
  Filled 2023-12-13: qty 1

## 2023-12-13 NOTE — Progress Notes (Signed)
 Physical Therapy Session Note  Patient Details  Name: Miguel Williams MRN: [id number] Date of Birth: Sep 05, 1945  Today's Date: 12/13/2023 PT Individual Time: 1605-1630 PT Individual Time Calculation (min): 25 min   Short Term Goals: Week 2:  PT Short Term Goal 1 (Week 2): STG = LTG due to ELOS  Skilled Therapeutic Interventions/Progress Updates:  Ambulation/gait training;Discharge planning;Psychosocial support;Functional mobility training;Therapeutic Activities;Wheelchair propulsion/positioning;Therapeutic Exercise;Skin care/wound management;Neuromuscular re-education;Disease management/prevention;Balance/vestibular training;Cognitive remediation/compensation;DME/adaptive equipment instruction;Pain management;Splinting/orthotics;UE/LE Coordination activities;UE/LE Strength taining/ROM;Stair training;Patient/family education;Functional electrical stimulation;Community reintegration   Pt received supine in bed and agrees to therapy. Reports pain in BLEs. Number not provided. PT provides rest breaks and mobility to manage pain. Pt performs supine to sit with logrolling and cues for positioning. Pt dons LSO with modA. Pt performs sit to stand with CGA and cues for initiation. Pt ambulates x150' to gym with CGA and cues to mintain upright gaze to improve posture and balance, and decreasing WB through RW for energy conservation and improved body mechanics. Pt takes seated rest break. Pt completes 6 minute walk test with RW and CGA, but requires seated break after 4:00, ambulating total of 525'. Following rest break, pt ambulates additional bouts of 175' and 150' with seated rest break and similar assistance and cues. Pt left supine with alarm intact and all needs within reach   Therapy Documentation Precautions:  Precautions Precautions: Back, Fall Required Braces or Orthoses: Spinal Brace Spinal Brace: Lumbar corset, Applied in sitting position Restrictions Weight Bearing Restrictions Per Provider  Order: No   Therapy/Group: Individual Therapy  Elsie JAYSON Dawn, PT, DPT 12/13/2023, 5:22 PM

## 2023-12-13 NOTE — Progress Notes (Signed)
 Occupational Therapy Session Note  Patient Details  Name: Miguel Williams MRN: [id number] Date of Birth: [birth year]  Today's Date: 12/13/2023 OT Individual Time: 1300-1415 OT Individual Time Calculation (min): 75 min    Short Term Goals: Week 2:  OT Short Term Goal 1 (Week 2): STGs=LTGs due to patient's estimated length of stay.  Skilled Therapeutic Interventions/Progress Updates:      Therapy Documentation Precautions:  Precautions Precautions: Back, Fall Required Braces or Orthoses: Spinal Brace Spinal Brace: Lumbar corset, Applied in sitting position Restrictions Weight Bearing Restrictions Per Provider Order: No General:  Pt supine in bed upon OT arrival, agreeable to OT session.  Pain: 7/10 pain reported in BLE, activity, intermittent rest breaks, distractions provided for pain management, pt reports tolerable to proceed.   ADL: OT providing skilled intervention on ADL retraining in order to increase independence with tasks and increase activity tolerance. Pt completed the following tasks at the current level of assist: Bed mobility: SBA using log roll technique  Grooming/oral hygiene: set up sitting in standard chair at sink Toilet transfer: CGA with RW ambulating  Toileting: SBA, void BM, able to complete 3/3 steps of toileting UB dressing: able to don LSO seated EOB before ambulating at beginning of session, set up for all UB tasks LB dressing: SBA with no AD necessary, able to complete figure 4 method Footwear: total A for socks, SBA for slip on shoes Shower transfer: CGA with RW  Bathing: SBA seated on BSC, able to wash posterior in sitting, use of long handle sponge   Exercises:Pt completed the following exercise circuit in order to improve functional activity, strength and endurance to prepare for ADLs such as bathing. Pt completed the following exercises in seated position with no noted LOB/SOB and 3x10 repetitions on each exercise: -leg curls with 3# ankle weight  and green theraband -seated marches with weight listed above   Pt supine in bed with bed alarm activated, 2 bed rails up, call light within reach and 4Ps assessed.   Therapy/Group: Individual Therapy  Camie Hoe, OTD, OTR/L 12/13/2023, 3:58 PM

## 2023-12-13 NOTE — Progress Notes (Signed)
 PROGRESS NOTE   Subjective/Complaints: No events overnight.  Blood pressure mildly elevated, systolic 130s to 849d.  Otherwise vitals are stable. Reported 10 out of 10 back pain overnight. MRI brain yesterday unremarkable.  ROS: Denies fevers, chills, N/V, abdominal pain, constipation, diarrhea, SOB, cough, chest pain, new weakness or paraesthesias.   + Bilateral lower extremity spasms    Objective:   MR BRAIN WO CONTRAST Result Date: 12/12/2023 EXAM: MRI BRAIN WITHOUT CONTRAST 12/12/2023 04:22:23 PM TECHNIQUE: Multiplanar multisequence MRI of the head/brain was performed without the administration of intravenous contrast. COMPARISON: MRI head 08/10/2023 CLINICAL HISTORY: Mental status change, unknown cause. FINDINGS: BRAIN AND VENTRICLES: T2/FLAIR hyperintensity in the periventricular and subcortical white matter compatible with mild for age chronic microvascular ischemic changes. Similar prominence of the ventricles likely related to parenchymal volume loss. No acute infarct. No intracranial hemorrhage. No mass. No midline shift. No hydrocephalus. The sella is unremarkable. Normal flow voids. ORBITS: Bilateral lens replacement. No acute abnormality. SINUSES AND MASTOIDS: Mild mucosal thickening in the right maxillary sinus. No acute abnormality. BONES AND SOFT TISSUES: Normal marrow signal. No acute soft tissue abnormality. IMPRESSION: 1. No acute intracranial abnormality. 2. Mild for age chronic microvascular ischemic changes. Electronically signed by: Donnice Mania MD 12/12/2023 05:23 PM EDT RP Workstation: [id number]    Recent Labs    12/11/23 0618  WBC 5.1  HGB 10.7*  HCT 32.1*  PLT 277   Recent Labs    12/11/23 0618  NA 140  K 4.9  CL 109  CO2 23  GLUCOSE 120*  BUN 22  CREATININE 1.41*  CALCIUM  8.8*    Intake/Output Summary (Last 24 hours) at 12/13/2023 0819 Last data filed at 12/13/2023 0813 Gross per 24 hour   Intake 0 ml  Output 475 ml  Net -475 ml        Physical Exam: Vital Signs Blood pressure (!) 134/55, pulse 85, temperature 98.4 F (36.9 C), temperature source Oral, resp. rate 16, height 5' 8 (1.727 m), weight 88 kg, SpO2 94%.  Constitutional: No apparent distress.  Laying in bed. HENT: No JVD. Neck Supple. Trachea midline. Atraumatic, normocephalic. Eyes: PERRLA. EOMI. Visual fields grossly intact.  + rolling of right eye during fixation intermittently--ongoing  Cardiovascular: RRR, no murmurs/rub/gallops. No Edema. Peripheral pulses 2+  Respiratory: CTAB. No rales, rhonchi, or wheezing. On RA.  Abdomen: + bowel sounds, normoactive. No distention or tenderness.  Skin: C/D/I.   -Surgical site well-approximated with Steri-Strips; no apparent drainage.  Fluctuance stable from prior exams   MSK:      No apparent deformity.    Neuro: Alert and oriented x 3. Normal insight and awareness. Intact Memory--some moderate cognitive deficits.  Slight dysarthria.. Cranial nerve exam unremarkable.   MMT:  BUE 5/5 prox to distal.  RLE 4-/5 HF, KE and 5/5 ADF/PF.   LLE 4+/5 HF, KE and 5/5 ADF/PF.    Pt with mild proprioceptive deficits in LE's--improved   BLE  LE ataxia   DTR's hyperreflexic in both patella, Achilles.  + Clonus on ankle jerk R >L --> not present in left lower extremity today.  No abnl resting tone.    Bilateral upper and lower extremity fine,  intention tremor--reducing   Assessment/Plan: 1. Functional deficits which require 3+ hours per day of interdisciplinary therapy in a comprehensive inpatient rehab setting. Physiatrist is providing close team supervision and 24 hour management of active medical problems listed below. Physiatrist and rehab team continue to assess barriers to discharge/monitor patient progress toward functional and medical goals  Care Tool:  Bathing    Body parts bathed by patient: Right arm, Left arm, Chest, Abdomen, Front perineal  area, Right upper leg, Left upper leg, Face   Body parts bathed by helper: Right lower leg, Left lower leg, Buttocks     Bathing assist Assist Level: Minimal Assistance - Patient > 75%     Upper Body Dressing/Undressing Upper body dressing   What is the patient wearing?: Pull over shirt, Orthosis    Upper body assist Assist Level: Moderate Assistance - Patient 50 - 74%    Lower Body Dressing/Undressing Lower body dressing      What is the patient wearing?: Underwear/pull up, Pants     Lower body assist Assist for lower body dressing: Moderate Assistance - Patient 50 - 74%     Toileting Toileting    Toileting assist Assist for toileting: Minimal Assistance - Patient > 75%     Transfers Chair/bed transfer  Transfers assist     Chair/bed transfer assist level: Contact Guard/Touching assist     Locomotion Ambulation   Ambulation assist      Assist level: Contact Guard/Touching assist Assistive device: Walker-rolling Max distance: 150   Walk 10 feet activity   Assist     Assist level: Contact Guard/Touching assist Assistive device: Walker-rolling   Walk 50 feet activity   Assist    Assist level: Contact Guard/Touching assist Assistive device: Walker-rolling    Walk 150 feet activity   Assist Walk 150 feet activity did not occur: Safety/medical concerns (fatigue)  Assist level: Contact Guard/Touching assist Assistive device: Walker-rolling    Walk 10 feet on uneven surface  activity   Assist Walk 10 feet on uneven surfaces activity did not occur: Safety/medical concerns         Wheelchair     Assist Is the patient using a wheelchair?: Yes Type of Wheelchair: Manual    Wheelchair assist level: Supervision/Verbal cueing Max wheelchair distance: 150'    Wheelchair 50 feet with 2 turns activity    Assist        Assist Level: Supervision/Verbal cueing   Wheelchair 150 feet activity     Assist      Assist  Level: Supervision/Verbal cueing   Blood pressure (!) 134/55, pulse 85, temperature 98.4 F (36.9 C), temperature source Oral, resp. rate 16, height 5' 8 (1.727 m), weight 88 kg, SpO2 94%.  1. 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              -patient may shower if wound is covered             -ELOS/Goals: 11-13 days, mod I to supervision goals -- 8/27 DC  - Stable to continue inpatient rehab    - 8-14: Developing hyperreflexia R lower extremity greater than left lower extremity; generalized fatigue; abnormal eye rolling movements on exam.  EEG today, strength and sensation in the lower extremities is consistent with prior exams so initial hyperreflexia was likely just postop.  Will monitor closely for neurologic changes.   8-15: EEG negative, eye rolling less apparent today.  Is improving strength in bilateral lower extremities, going from post-op hyporeflexia and hyperreflexic bilaterally.  Therapy performance improving today as compared to yesterday. monitor closely for neurologic changes over the weekend--per therapies, he does a poor job of maintaining spinal precautions in bed.   8/19: Caregiver support ?; uncertain of GF vs. Home nurse (2 weeks on discharge). Cog now improving to where he can repeat back precautions but too impulsive to follow them. CGA with gait 175 ft with RW; self-limiting. SPV to Min A cog; difficulty transitioning from structured to functional tasks.   2.  Antithrombotics: -DVT/anticoagulation:  Mechanical: Sequential compression devices, below knee Bilateral lower extremities             -antiplatelet therapy: N/A--colchicine  for gout  3. Pain Management: Tylenol  prn mild pain or oxycodone  prn severe pain             -encourage pre-treatment with oxycodone  prior to therapies for better activity tolerance--tolerating today   - 8/13: Hx alcohol   abuse, severe neuropathic pain overlying radicular pain - will get B12, folate, B6 levels with next labs--B12 high, folate/B6 look okay--DC B12   8-14:  switch her over to tizanidine  every 8 hours as needed for spasticity; he also had hallucinations with baclofen in the past--doing better with tizanidine   Kpad ordered   - 8-18: Reduce oxycodone  to 5 mg 3 times daily as needed for severe pain, increase Tylenol  to 500 to 1000 mg 3 times daily as needed for mild to moderate pain    - 8/19: Schedule tylenol ; add dantrolene  25 mg daily for spasms, check LFTs in 1 week prior to increase.   - Reports by caregiver patient was taking hydrocodone  at home; will return to home regimen at discharge  - 8-20: Add Lyrica  50 mg nightly for severe back pain, spasms of chronic  4. Mood/Behavior/Sleep: LCSW to follow for evaluation and support.              -antipsychotic agents: N/A  5. Neuropsych/cognition/Hx Wernicke's encephalopathy: This patient is capable of making decisions on his own behalf.   - Reviewed notes from [facility] neurology 10-2023; does not meet criteria for idiopathic Parkinson's at this time, but is undergoing current evaluation.  Has history of Wernicke's encephalopathy and was continued to drink at time of last exam.  - On thiamine , B12 supplement.  - 8/14: EEG for abnormal eye rolling movements on exam, fatigue; negative.  Talking with staff, was present on admission but is not as noticeable. 8-15: EEG negative.  Neuropsych noted some attention and memory deficits, but not profound.    - 8/19: Anticipate 100% supervision at home; discussed with the patient's caregiver, will get MRI given concerns for hitting his head during prior falls and possible overlying TBI--negative for acute changes, likely overlying concussion. Will f/u with Neuro as OP for cog deficits and eye movements  6. Skin/Wound Care: Monitor incision for healing.  -dressing changes as needed. -Routine pressure relief  measures.  -8/14: Appears with adhesive allergy on back; dressings removed. surgical site appears stable. - 8-15: Some fluctuance on the top and bottom of surgical site - nontender, no drainage, notably improving neurologic exam. Monitor for now.  8-18: Significant increase in fluctuance from last week, informed neurosurgery PA due to concern for reaccumulating seroma; continuing to make therapy progress-- Appreciate Suzen Pean NP coming in, no concerning changes with wound  7. Fluids/Electrolytes/Nutrition: Monitor I/O. Check CMET in am.    8.  HTN: Monitor BP TID--on Amlodipine   and Avapro .   - Blood pressure stable on current regimen    12/13/2023    3:53 AM 12/12/2023    7:51 PM 12/12/2023    3:40 PM  Vitals with BMI  Systolic 134 150 856  Diastolic 55 74 79  Pulse 85 85 84    9.  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 on 08/07-->recheck labs in am  - 8-13: Stable creatinine, increased BUN on a.m. labs; encourage p.o. fluids, recheck in 2 days  8-18: Creatinine 1.41 , BUN improved, monitor  11.  Anemia: Hgb 11.5 on 08/07--> recheck cbc in am.  Of note, has needed multiple blood transfusions s/p fall May of this year.  - 8-13: 10.5, within limits of variability.  Endorses no bleeding.  Trend, get iron studies, vitamin C, folate, B12 with next labs.   8/14: Fe low IDA despite supplement TID - in setting of CKD - may need venofer infusion if downtrends  8-18: Hemoglobin stable  9.  H/o Gout: continue colchicine .   10. Hypothyroid: continue synthroid  for supplement.   11. Anxiety/alcohol  abuse: continue Klonopin  at bedtime prn. - Provide education - 8-18: Caregiver reports estimate drinking about a bottle of wine per night at home prior to admission.  12. Hx bladder cancer: Treated with TURBT/infusion by Dr. Devere             -see #9             -monitor voiding patterns.             -I/O caths prn -  voiding continent  13. Neurogenic bowel/constipation   Senakot changed to 2 Senna HS  Colace decreased to BID  Last bowel movement 8-16, medium   - 8/19: Multiple BM this AM  - DC at bedtime sennakot, add PRN miralax  17 g daily  LOS: 8 days A FACE TO FACE EVALUATION WAS PERFORMED  Miguel Williams Likes 12/13/2023, 8:19 AM

## 2023-12-13 NOTE — Progress Notes (Signed)
 Speech Language Pathology Daily Session Note  Patient Details  Name: Morey Andonian MRN: [id number] Date of Birth: 19-Dec-1945  Today's Date: 12/13/2023 SLP Individual Time: 0817-0902 SLP Individual Time Calculation (min): 45 min  Short Term Goals: Week 1: SLP Short Term Goal 1 (Week 1): STGs=LTGs d/t ELOS  Skilled Therapeutic Interventions: SLP conducted skilled therapy session targeting cognitive goals. Patient is exhibiting improvements in cognitive function compared to prior sessions, requiring only supervision to modI to recall WRAP memory strategies during functional tasks and integrate their use during sessions. Using repetition strategy, patient required min assist to recall list of 5 words immediately and benefited from min assist for immediate recall of pictured information (80% acc. Independently). After formal use of repetition with SLP, recalled 100% of information after 3 minute distracted delay. Patient then completed visually based basic to mildly complex problem solving task with modI. During mildly complex deductive reasoning task, patient required supervision to min assist. Patient was left in room with call bell in reach and alarm set. SLP will continue to target goals per plan of care.        Pain Pain Assessment Pain Scale: 0-10 Pain Score: 6  Pain Location: Leg Pain Orientation: Right;Left  Therapy/Group: Individual Therapy  Anneka Studer, M.A., CCC-SLP  Susane Bey A Doy Taaffe 12/13/2023, 11:27 AM

## 2023-12-13 NOTE — Progress Notes (Signed)
 Physical Therapy Weekly Progress Note  Patient Details  Name: Miguel Williams MRN: [id number] Date of Birth: [birth year]  Beginning of progress report period: December 06, 2023 End of progress report period: December 13, 2023  Today's Date: 12/13/2023 PT Individual Time: 1107-1202 PT Individual Time Calculation (min): 55 min   Short term goals not set due to expected length of stay however pt continues to progress towards long term goals. Pt has been currently limited by cognitive deficits limiting carryover of education on back precautions and desired techniques as well as impulsivity, and pt limited by back pain/spasms. Pt currently requires close supervision for bed mobility with cues for log roll to maintain back precautions, transfers with CGA with cues for safety to decrease impulsive sitting as well as maintain back precautions, and ambulates 200' with CGA with RW and cues for positioning prior to sitting. Pt completes up/down 4 steps with BHRs CGA. Pt demonstrates significantly limited standing tolerance, unable to maintain standing >60 seconds if not ambulating. Family education has been completed with pt personal care nurse [name] who will provide 24/7 supervision at DC.   Patient continues to demonstrate the following deficits muscle weakness, decreased cardiorespiratoy endurance, impaired timing and sequencing, unbalanced muscle activation, decreased coordination, and decreased motor planning, decreased attention, decreased safety awareness, and decreased memory, and decreased standing balance, decreased postural control, and decreased balance strategies and therefore will continue to benefit from skilled PT intervention to increase functional independence with mobility.  Patient progressing toward long term goals..  Continue plan of care.  PT Short Term Goals Week 1:  PT Short Term Goal 1 (Week 1): STG = LTG due to ELOS  Skilled Therapeutic Interventions/Progress Updates:  Pt presents in  room in bed demonstrating breaking back precautions by twisting in supine while reaching for bedside table. Pt provided with education on back precautions and calling for assistance when looking for items with pt verbalizing understanding but continues to demonstrate impulsivity. Session focused on therapeutic activities for reinforcing back precautions with all mobility throughout session as well as gait training with various assistive devices. Pt completes bed mobility via log roll with cues with supervision and cues for scooting to EOB. Pt provided with pants, provided with min assist for threading pants as pt demonstrating impulsive trunk flexion to lean forward for threading pants. Therapist dons shoes and socks with total assist for time management. Pt completes sit to stand with RW with cues to decrease impulsivity, CGA. Pt completes gait to day room ~150' with CGA, cues for upright posture however improved BLE terminal knee extension, flexion noted minimally with fatigue. Pt completes standing marches 4x4 reps with pt demonstrating significant R trendelenberg with R single limb stance with LOB, requiring min assist for postural stability and max verbal/tactile cues for R glute activation with R stance. Pt completes gait with quad cane x25' with min assist, pt requesting impulsive sitting with fear of falling however requires cues for upright posturing and for safety turning to ambulate back to mat with quad cane, pt demonstrating poor sequencing initially with pt demonstrating R trendelenberg with R stance phase. Pt provided with verbal/visual cues for sequencing steps, completes 3x20' gait trials with CGA/min assist pt demonstrating picking up quad cane for turns but improves with cues and repetition. Pt requesting extended seated rest break with back support, therapist provides WC for pt to sit in. Pt then completes NMR 2x25 reps standing stepping on kinetron at 30 cm/sec resistance with CGA. Pt returns to  room and completes  stand step transfer no device CGA, completes bed mobility with supervision and verbal cues. Pt remains semi reclined in bed with all needs within reach, cal light in place and bed alarm activated at end of session.     Ambulation/gait training;Discharge planning;Psychosocial support;Functional mobility training;Therapeutic Activities;Wheelchair propulsion/positioning;Therapeutic Exercise;Skin care/wound management;Neuromuscular re-education;Disease management/prevention;Balance/vestibular training;Cognitive remediation/compensation;DME/adaptive equipment instruction;Pain management;Splinting/orthotics;UE/LE Coordination activities;UE/LE Strength taining/ROM;Stair training;Patient/family education;Functional electrical stimulation;Community reintegration   Therapy Documentation Precautions:  Precautions Precautions: Back, Fall Required Braces or Orthoses: Spinal Brace Spinal Brace: Lumbar corset, Applied in sitting position Restrictions Weight Bearing Restrictions Per Provider Order: No   Therapy/Group: Individual Therapy  Reche Ohara PT, DPT 12/13/2023, 3:55 PM

## 2023-12-14 DIAGNOSIS — M48062 Spinal stenosis, lumbar region with neurogenic claudication: Secondary | ICD-10-CM | POA: Diagnosis not present

## 2023-12-14 LAB — CBC
HCT: 33.9 % — ABNORMAL LOW (ref 39.0–52.0)
Hemoglobin: 11.2 g/dL — ABNORMAL LOW (ref 13.0–17.0)
MCH: 30.9 pg (ref 26.0–34.0)
MCHC: 33 g/dL (ref 30.0–36.0)
MCV: 93.6 fL (ref 80.0–100.0)
Platelets: 280 K/uL (ref 150–400)
RBC: 3.62 MIL/uL — ABNORMAL LOW (ref 4.22–5.81)
RDW: 13.3 % (ref 11.5–15.5)
WBC: 5.9 K/uL (ref 4.0–10.5)
nRBC: 0 % (ref 0.0–0.2)

## 2023-12-14 LAB — BASIC METABOLIC PANEL WITH GFR
Anion gap: 13 (ref 5–15)
BUN: 25 mg/dL — ABNORMAL HIGH (ref 8–23)
CO2: 21 mmol/L — ABNORMAL LOW (ref 22–32)
Calcium: 8.7 mg/dL — ABNORMAL LOW (ref 8.9–10.3)
Chloride: 106 mmol/L (ref 98–111)
Creatinine, Ser: 1.47 mg/dL — ABNORMAL HIGH (ref 0.61–1.24)
GFR, Estimated: 49 mL/min — ABNORMAL LOW (ref >60)
Glucose, Bld: 110 mg/dL — ABNORMAL HIGH (ref 70–99)
Potassium: 4.2 mmol/L (ref 3.5–5.1)
Sodium: 140 mmol/L (ref 135–145)

## 2023-12-14 MED ORDER — PREGABALIN 50 MG PO CAPS
50.0000 mg | ORAL_CAPSULE | Freq: Every evening | ORAL | Status: DC | PRN
  Administered 2023-12-14 – 2023-12-16 (×3): 50 mg via ORAL
  Filled 2023-12-14 (×3): qty 1

## 2023-12-14 MED ORDER — PREGABALIN 50 MG PO CAPS
50.0000 mg | ORAL_CAPSULE | Freq: Three times a day (TID) | ORAL | Status: DC
  Administered 2023-12-14 – 2023-12-15 (×3): 50 mg via ORAL
  Filled 2023-12-14 (×3): qty 1

## 2023-12-14 NOTE — Progress Notes (Signed)
 PROGRESS NOTE   Subjective/Complaints: No events overnight.   Labs with elevated BUN/Cr 25/1.47 today. Other labs stable.  Vitals stable  7-9/10 leg pain throughout yesterday; states burning in his bilateral feet is getting severe and was difficult to endure overnight.  States he is getting sensitized to his current doses of oxycodone  and tizanidine .  ROS: Denies fevers, chills, N/V, abdominal pain, constipation, diarrhea, SOB, cough, chest pain, new weakness or paraesthesias.   + Bilateral lower extremity spasms + Bilateral lower extremity burning pain   Objective:   MR BRAIN WO CONTRAST Result Date: 12/12/2023 EXAM: MRI BRAIN WITHOUT CONTRAST 12/12/2023 04:22:23 PM TECHNIQUE: Multiplanar multisequence MRI of the head/brain was performed without the administration of intravenous contrast. COMPARISON: MRI head 08/10/2023 CLINICAL HISTORY: Mental status change, unknown cause. FINDINGS: BRAIN AND VENTRICLES: T2/FLAIR hyperintensity in the periventricular and subcortical white matter compatible with mild for age chronic microvascular ischemic changes. Similar prominence of the ventricles likely related to parenchymal volume loss. No acute infarct. No intracranial hemorrhage. No mass. No midline shift. No hydrocephalus. The sella is unremarkable. Normal flow voids. ORBITS: Bilateral lens replacement. No acute abnormality. SINUSES AND MASTOIDS: Mild mucosal thickening in the right maxillary sinus. No acute abnormality. BONES AND SOFT TISSUES: Normal marrow signal. No acute soft tissue abnormality. IMPRESSION: 1. No acute intracranial abnormality. 2. Mild for age chronic microvascular ischemic changes. Electronically signed by: Donnice Mania MD 12/12/2023 05:23 PM EDT RP Workstation: [id number]    Recent Labs    12/14/23 0522  WBC 5.9  HGB 11.2*  HCT 33.9*  PLT 280   Recent Labs    12/14/23 0522  NA 140  K 4.2  CL 106  CO2 21*   GLUCOSE 110*  BUN 25*  CREATININE 1.47*  CALCIUM  8.7*    Intake/Output Summary (Last 24 hours) at 12/14/2023 0940 Last data filed at 12/14/2023 0803 Gross per 24 hour  Intake 600 ml  Output 1350 ml  Net -750 ml        Physical Exam: Vital Signs Blood pressure (!) 132/59, pulse 77, temperature 97.8 F (36.6 C), resp. rate 18, height 5' 8 (1.727 m), weight 88 kg, SpO2 95%.  Constitutional: No apparent distress.  Sitting upright in bedside chair. HENT: No JVD. Neck Supple. Trachea midline. Atraumatic, normocephalic. Eyes: PERRLA. EOMI. Visual fields grossly intact.  + rolling of right eye during fixation intermittently--ongoing  Cardiovascular: RRR, no murmurs/rub/gallops. No Edema.  Respiratory: CTAB. No rales, rhonchi, or wheezing. On RA.  Abdomen: + bowel sounds, normoactive. No distention or tenderness.  Skin: C/D/I.   -Surgical site well-approximated with Steri-Strips; no apparent drainage. --More fluctuance at the top of the surgical site than the bottom, does appear stable.  MSK:      No apparent deformity.    Neuro: Alert and oriented x 3. Normal insight and awareness.  Intact Memory--some moderate cognitive deficits.    Cranial nerve exam unremarkable.   MMT:  BUE 5/5 prox to distal.  RLE 4+/5 HF, KE and 5/5 ADF/PF.   LLE 4+/5 HF, KE and 5/5 ADF/PF.    Pt with mild proprioceptive deficits in LE's--improved   BLE  LE ataxia --improving  DTR's hyperreflexic in  both patella, Achilles--reducing.  Bilateral upper extremity reflexes 2+.  Negative Hoffmann's. + Clonus on ankle jerk R; no longer present on the left No apparent tone   Assessment/Plan: 1. Functional deficits which require 3+ hours per day of interdisciplinary therapy in a comprehensive inpatient rehab setting. Physiatrist is providing close team supervision and 24 hour management of active medical problems listed below. Physiatrist and rehab team continue to assess barriers to discharge/monitor  patient progress toward functional and medical goals  Care Tool:  Bathing    Body parts bathed by patient: Right arm, Left arm, Chest, Abdomen, Front perineal area, Right upper leg, Left upper leg, Face   Body parts bathed by helper: Right lower leg, Left lower leg, Buttocks     Bathing assist Assist Level: Minimal Assistance - Patient > 75%     Upper Body Dressing/Undressing Upper body dressing   What is the patient wearing?: Pull over shirt, Orthosis    Upper body assist Assist Level: Moderate Assistance - Patient 50 - 74%    Lower Body Dressing/Undressing Lower body dressing      What is the patient wearing?: Underwear/pull up, Pants     Lower body assist Assist for lower body dressing: Moderate Assistance - Patient 50 - 74%     Toileting Toileting    Toileting assist Assist for toileting: Minimal Assistance - Patient > 75%     Transfers Chair/bed transfer  Transfers assist     Chair/bed transfer assist level: Contact Guard/Touching assist     Locomotion Ambulation   Ambulation assist      Assist level: Contact Guard/Touching assist Assistive device: Walker-rolling Max distance: 150   Walk 10 feet activity   Assist     Assist level: Contact Guard/Touching assist Assistive device: Walker-rolling   Walk 50 feet activity   Assist    Assist level: Contact Guard/Touching assist Assistive device: Walker-rolling    Walk 150 feet activity   Assist Walk 150 feet activity did not occur: Safety/medical concerns (fatigue)  Assist level: Contact Guard/Touching assist Assistive device: Walker-rolling    Walk 10 feet on uneven surface  activity   Assist Walk 10 feet on uneven surfaces activity did not occur: Safety/medical concerns         Wheelchair     Assist Is the patient using a wheelchair?: Yes Type of Wheelchair: Manual    Wheelchair assist level: Supervision/Verbal cueing Max wheelchair distance: 150'    Wheelchair  50 feet with 2 turns activity    Assist        Assist Level: Supervision/Verbal cueing   Wheelchair 150 feet activity     Assist      Assist Level: Supervision/Verbal cueing   Blood pressure (!) 132/59, pulse 77, temperature 97.8 F (36.6 C), resp. rate 18, height 5' 8 (1.727 m), weight 88 kg, SpO2 95%.  1. 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              -patient may shower if wound is covered             -ELOS/Goals: 11-13 days, mod I to supervision goals -- 8/27 DC  - Stable to continue inpatient rehab    - 8-14: Developing hyperreflexia R lower extremity greater than left lower extremity; generalized fatigue; abnormal eye rolling movements on exam.  EEG today, strength and sensation in the lower extremities is consistent with prior exams so initial  hyperreflexia was likely just postop.  Will monitor closely for neurologic changes.   8-15: EEG negative, eye rolling less apparent today.  Is improving strength in bilateral lower extremities, going from post-op hyporeflexia and hyperreflexic bilaterally.  Therapy performance improving today as compared to yesterday. monitor closely for neurologic changes over the weekend--per therapies, he does a poor job of maintaining spinal precautions in bed.   8/19: Caregiver support ?; uncertain of GF vs. Home nurse (2 weeks on discharge). Cog now improving to where he can repeat back precautions but too impulsive to follow them. CGA with gait 175 ft with RW; self-limiting. SPV to Min A cog; difficulty transitioning from structured to functional tasks.   2.  Antithrombotics: -DVT/anticoagulation:  Mechanical: Sequential compression devices, below knee Bilateral lower extremities             -antiplatelet therapy: N/A--colchicine  for gout  3. Pain Management: Tylenol  prn mild pain or oxycodone  prn severe pain              -encourage pre-treatment with oxycodone  prior to therapies for better activity tolerance--tolerating today   - 8/13: Hx alcohol  abuse, severe neuropathic pain overlying radicular pain - will get B12, folate, B6 levels with next labs--B12 high, folate/B6 look okay--DC B12   8-14:  switch her over to tizanidine  every 8 hours as needed for spasticity; he also had hallucinations with baclofen in the past--doing better with tizanidine   Kpad ordered   - 8-18: Reduce oxycodone  to 5 mg 3 times daily as needed for severe pain, increase Tylenol  to 500 to 1000 mg 3 times daily as needed for mild to moderate pain    - 8/19: Schedule tylenol ; add dantrolene  25 mg daily for spasms, check LFTs in 1 week prior to increase.   - Reports by caregiver patient was taking hydrocodone  at home; will return to home regimen at discharge--discussed this with patient 8-20  - 8-20: Add Lyrica  25 mg daily and 50 mg nightly for severe back pain, bilateral neuropathy  - 8/21: Increase lyirca to 50 mg TID; add additional 50 mg nightly for burning pain.  Would increase duloxetine  next.  4. Mood/Behavior/Sleep: LCSW to follow for evaluation and support.              -antipsychotic agents: N/A  5. Neuropsych/cognition/Hx Wernicke's encephalopathy: This patient is capable of making decisions on his own behalf.   - Reviewed notes from [facility] neurology 10-2023; does not meet criteria for idiopathic Parkinson's at this time, but is undergoing current evaluation.  Has history of Wernicke's encephalopathy and was continued to drink at time of last exam.  - On thiamine , B12 supplement.  - 8/14: EEG for abnormal eye rolling movements on exam, fatigue; negative.  Talking with staff, was present on admission but is not as noticeable. 8-15: EEG negative.  Neuropsych noted some attention and memory deficits, but not profound.    - 8/19: Anticipate 100% supervision at home; discussed with the patient's caregiver, will get MRI given  concerns for hitting his head during prior falls and possible overlying TBI--negative for acute changes, likely overlying concussion. Discussed results with patient and likely concussion 8-20 Will f/u with Neuro Dr. Evonnie as OP    6. Skin/Wound Care: Monitor incision for healing.  -dressing changes as needed. -Routine pressure relief measures.  -8/14: Appears with adhesive allergy on back; dressings removed. surgical site appears stable. - 8-15: Some fluctuance on the top and bottom of surgical site - nontender, no drainage, notably improving  neurologic exam. Monitor for now.  8-18: Significant increase in fluctuance from last week, informed neurosurgery PA due to concern for reaccumulating seroma; continuing to make therapy progress-- Appreciate Suzen Pean NP coming in, no concerning changes with wound  7. Fluids/Electrolytes/Nutrition: Monitor I/O. Check CMET in am.    8.  HTN: Monitor BP TID--on Amlodipine  and Avapro .   - Blood pressure stable on current regimen    12/14/2023    4:47 AM 12/13/2023    7:33 PM 12/13/2023    3:05 PM  Vitals with BMI  Systolic 132 132 890  Diastolic 59 73 69  Pulse 77 73 85    9.  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 on 08/07-->recheck labs in am  - 8-13: Stable creatinine, increased BUN on a.m. labs; encourage p.o. fluids, recheck in 2 days  8/18: Creatinine 1.41 , BUN improved, monitor  8/21: Cr 1.47, BUN up - PO fluids remain minimal - push today   11.  Anemia: Hgb 11.5 on 08/07--> recheck cbc in am.  Of note, has needed multiple blood transfusions s/p fall May of this year.  - 8-13: 10.5, within limits of variability.  Endorses no bleeding.  Trend, get iron studies, vitamin C, folate, B12 with next labs.   8/14: Fe low IDA despite supplement TID - in setting of CKD - may need venofer infusion if downtrends  8-18: Hemoglobin stable  9.  H/o Gout: continue colchicine .    10. Hypothyroid: continue synthroid  for supplement.   11. Anxiety/alcohol  abuse: continue Klonopin  at bedtime prn. - Provide education - 8-18: Caregiver reports estimate drinking about a bottle of wine per night at home prior to admission.  12. Hx bladder cancer: Treated with TURBT/infusion by Dr. Devere             -see #9             -monitor voiding patterns.             -I/O caths prn - voiding continent  13. Neurogenic bowel/constipation   Senakot changed to 2 Senna HS  Colace decreased to BID  - Last bowel movement 8-16, medium   - 8/19: Multiple BM this AM  - DC at bedtime sennakot, add PRN miralax  17 g daily  LOS: 9 days A FACE TO FACE EVALUATION WAS PERFORMED  Joesph JAYSON Likes 12/14/2023, 9:40 AM

## 2023-12-14 NOTE — Progress Notes (Signed)
 Patient ID: Miguel Williams, male   DOB: October 21, 1945, 78 y.o.   MRN: [id number]   [facility] declined referral.  [facility] reporting branch unable to accept this policy at this time.   SW sent referral to [facility] and waiting on follow-up. *referral accepted.  SW met with pt in room to inform on above.   Declined HHAs [facility]   Graeme Jude, MSW, LCSW Office: [phone] Cell: [phone] Fax: [phone]

## 2023-12-14 NOTE — Progress Notes (Signed)
 Speech Language Pathology Weekly Progress and Session Note  Patient Details  Name: Taariq Leitz MRN: [id number] Date of Birth: 1945/07/31  Beginning of progress report period: December 07, 2023 End of progress report period: December 14, 2023  Today's Date: 12/14/2023 SLP Individual Time: 0815-0917 SLP Individual Time Calculation (min): 62 min  Short Term Goals: Week 1: SLP Short Term Goal 1 (Week 1): STGs=LTGs d/t ELOS SLP Short Term Goal 1 - Progress (Week 1): Progressing toward goal  New Short Term Goals: Week 2: SLP Short Term Goal 1 (Week 2): STGs=LTGs d/t ELOS  Weekly Progress Updates: Patient has made excellent progress towards therapy goals. Patient's estimated length of stay originally did not allow for setting of short term goals, however expected LOS was extended, thus patient will continue to target long term goals set at admission. Patient currently benefits from supervision to solve mildly complex problems and supervision to min assist to utilize WRAP memory strategies to recall mildly to moderately complex information. Patient's carryover between sessions is improving, and patient is beginning to incorporate reaching precautions independently during sessions. Patient and family education ongoing. Patient will continue to benefit from skilled therapy services during remainder of CIR stay.     Intensity: Minumum of 1-2 x/day, 30 to 90 minutes Frequency: 3 to 5 out of 7 days Duration/Length of Stay: 8/27 Treatment/Interventions: Cognitive remediation/compensation;Internal/external aids;Cueing hierarchy;Environmental controls;Therapeutic Activities;Functional tasks;Patient/family education  Daily Session  Skilled Therapeutic Interventions: SLP conducted skilled therapy session targeting cognitive goals. Patient recalled details from previous session and WRAP memory strategies with modI upon SLP arrival. SLP challenged working memory via task required immediate recall and execution  of mildly complex to complex 2-3 step directions. As complexity increased, patient required min assist for utilization of WRAP memory strategies to recall detailed information. SLP then challenged visuospatial organization and pattern matching with patient benefiting from supervision for mildly complex and min to mod assist for most complex aspects of task. Patient's cognitive status is exhibiting continued improvements from session to session as patient approaches discharge date. Patient was left in room with call bell in reach and alarm set. SLP will continue to target goals per plan of care.        Pain Pain Assessment Pain Scale: 0-10 Pain Score: 7  Pain Location: Leg Pain Intervention(s): Medication (See eMAR)  Therapy/Group: Individual Therapy  Suni Jarnagin, M.A., CCC-SLP  Leonetta Mcgivern A Anani Gu 12/14/2023, 10:20 AM

## 2023-12-14 NOTE — Progress Notes (Signed)
 Occupational Therapy Session Note  Patient Details  Name: Miguel Williams MRN: [id number] Date of Birth: [birth year]  Today's Date: 12/14/2023 OT Individual Time: 1430-1458 OT Individual Time Calculation (min): 28 min    Short Term Goals: Week 2:  OT Short Term Goal 1 (Week 2): STGs=LTGs due to patient's estimated length of stay.  Skilled Therapeutic Interventions/Progress Updates:    Patient received supine in bed.  Reviewed back precautions, and patient needing cueing for what letters BLT - stand for.  Patient states b-bending, then there's twist - but I don't know what letter that is.  Reviewed Bend, Lift, Twist restrictions.  Worked on supine to sit and donning back brace once seated.  Patient needs mod cueing to orient brace top/bottom, inside/ outside, but able to don following precautions once cued.   Patient with rotational eye roll, ? Habitual, while talking.  Not noted during transitional movements - consistent rightward bilateral eye torsion.   Patient left in bed at end of session with bed alarm engaged and call bell/ personal items in reach.    Therapy Documentation Precautions:  Precautions Precautions: Back, Fall Required Braces or Orthoses: Spinal Brace Spinal Brace: Lumbar corset, Applied in sitting position Restrictions Weight Bearing Restrictions Per Provider Order: No   Pain: Reports neuropathy much better on new medication      Therapy/Group: Individual Therapy  Nadine Ryle M 12/14/2023, 3:00 PM

## 2023-12-14 NOTE — Progress Notes (Signed)
 Physical Therapy Session Note  Patient Details  Name: Miguel Williams MRN: [id number] Date of Birth: 1945/10/01  Today's Date: 12/14/2023 PT Individual Time: 1106-1201 + 8695-8654 PT Individual Time Calculation (min): 55 min + 41 min  Short Term Goals: Week 2:  PT Short Term Goal 1 (Week 2): STG = LTG due to ELOS  Skilled Therapeutic Interventions/Progress Updates:    SESSION 1: Pt presents in room in bed, agreeable to PT but reporting increased neuropathy pain, back pain, and spasms at start of session. Session focused on therapeutic activities to promote improved tolerance to upright as well as NMR for dynamic standing balance and postural stability with decreasing UE support. Pt completes bed mobility with superviison, therapist dons LSO total assist for time management and to ensure appropriate fit. Pt completes transfers without device with CGA/light min assist cues for turning completely prior to sitting. Pt transported to day room and set up in standing frame to assist with standing tolerance and decrease impulsive sitting, pt stands for 12:34 minutes with cues for upright posture, completes pipe tree activity as dual task cognitive challenge as well as decrease perseveration with sitting, pt requires mod assist for sequencing completion of pipe tree activity including organization of items as well as sequencing initially, pt able to complete task however does demonstrate some BUE resting tremors with task possible secondary to fatigue. Pt requests to sit at end of task. Pt then completes NMR for dynamic standing balance with decreased UE support ambulating with [facility] in LUE 4x36' with min cues for sequencing stepping. Pt returns to room and remains seated in [facility] with all needs within reach, cal light in place and chair alarm donned and activated at end of session, requires max cues and education on remaining in [facility] for an hour until next PT session to increase sitting tolerance to improve overall  upright tolerance.   SESSION 2: Pt presents in room in [facility], agreeable to PT. Pt reporting significant pain in back and BLEs, RN notified and provides medication during session. Session focused on NMR for BLE muscle fiber recruitment and dynamic standing balance as well as therapeutic activities for tolerance to upright. Pt provided with education throughout session on upright tolerance including sitting tolerance to promote increased OOB tolerance, pt requesting to return to supine during session however agreeable to remain upright with education throughout. Pt transported to day room, ambulates 180' with RW CGA with cues for upright posture. Pt then completes NMR for BLE muscle fiber recruitment, comes to sitting on kinetron and completes standing stair stepping 30 cm/sec resistance 3x30 reps, maintains upright for 30 seconds with each trial. Pt then completes standing therex with BUE support on RW for NMR to promote BLE muscle fiber recruitment and dynamic standing balance with therapist providing verbal/tactile cues for terminal knee extension exercises included: - mini squats x10 - heel raises x15 - marches x10 BLE Pt returns to room ambulating ~150' with RW CGA, returns to sitting on EOB and completes log roll with supervision. Pt remains semi reclined in bed with all needs within reach, cal light in place and bed alarm activated at end of session.    Therapy Documentation Precautions:  Precautions Precautions: Back, Fall Required Braces or Orthoses: Spinal Brace Spinal Brace: Lumbar corset, Applied in sitting position Restrictions Weight Bearing Restrictions Per Provider Order: No   Therapy/Group: Individual Therapy  Reche Ohara PT, DPT 12/14/2023, 12:35 PM

## 2023-12-15 DIAGNOSIS — M48062 Spinal stenosis, lumbar region with neurogenic claudication: Secondary | ICD-10-CM | POA: Diagnosis not present

## 2023-12-15 MED ORDER — DULOXETINE HCL 60 MG PO CPEP
60.0000 mg | ORAL_CAPSULE | Freq: Every morning | ORAL | Status: DC
Start: 2023-12-16 — End: 2023-12-19
  Administered 2023-12-16 – 2023-12-19 (×4): 60 mg via ORAL
  Filled 2023-12-15 (×4): qty 1

## 2023-12-15 MED ORDER — DOCUSATE SODIUM 100 MG PO CAPS
200.0000 mg | ORAL_CAPSULE | Freq: Two times a day (BID) | ORAL | Status: DC
  Administered 2023-12-15 – 2023-12-20 (×10): 200 mg via ORAL
  Filled 2023-12-15 (×10): qty 2

## 2023-12-15 MED ORDER — PREGABALIN 25 MG PO CAPS
25.0000 mg | ORAL_CAPSULE | Freq: Three times a day (TID) | ORAL | Status: DC
  Administered 2023-12-15 – 2023-12-17 (×6): 25 mg via ORAL
  Filled 2023-12-15 (×6): qty 1

## 2023-12-15 MED ORDER — DULOXETINE HCL 30 MG PO CPEP
30.0000 mg | ORAL_CAPSULE | Freq: Every day | ORAL | Status: DC
  Administered 2023-12-15 – 2023-12-18 (×4): 30 mg via ORAL
  Filled 2023-12-15 (×4): qty 1

## 2023-12-15 NOTE — Progress Notes (Signed)
 PROGRESS NOTE   Subjective/Complaints: No events overnight.     Used 2 of his 3 oxycodone  PRNs, along with Lyrica  as needed overnight. He states he had no pain all night and slept very well, but since waking up at 6 am has felt sluggish and tired. No fevers, chills, nausea, or other symptoms  Vitals are stable. Last bowel movement 8-19, large, multiple  ROS: Denies fevers, chills, N/V, abdominal pain, constipation, diarrhea, SOB, cough, chest pain, new weakness or paraesthesias.   + Bilateral lower extremity spasms -- essentially resolved + Bilateral lower extremity burning pain--improving + Lethargy - new  Objective:   No results found.   Recent Labs    12/14/23 0522  WBC 5.9  HGB 11.2*  HCT 33.9*  PLT 280   Recent Labs    12/14/23 0522  NA 140  K 4.2  CL 106  CO2 21*  GLUCOSE 110*  BUN 25*  CREATININE 1.47*  CALCIUM  8.7*    Intake/Output Summary (Last 24 hours) at 12/15/2023 1001 Last data filed at 12/15/2023 0754 Gross per 24 hour  Intake 720 ml  Output 825 ml  Net -105 ml        Physical Exam: Vital Signs Blood pressure 139/69, pulse 79, temperature 97.8 F (36.6 C), resp. rate 15, height 5' 8 (1.727 m), weight 88 kg, SpO2 100%.  Constitutional: No apparent distress.  Laying in bed. Awake but tired appearing.  HENT: No JVD. Neck Supple. Trachea midline. Atraumatic, normocephalic. Eyes: PERRLA. EOMI. Visual fields grossly intact.  + rolling of right eye during fixation intermittently--ongoing, less apparent today  Cardiovascular: RRR, no murmurs/rub/gallops. No Edema.  Respiratory: CTAB. No rales, rhonchi, or wheezing. On RA.  Abdomen: + bowel sounds, normoactive. No distention or tenderness.  Skin: C/D/I.   -Surgical site well-approximated with Steri-Strips;   --More fluctuance at the top of the surgical site than the bottom, improving, no erythema or drainage.   MSK:      No apparent  deformity.    Neuro: Alert and oriented x 3. Normal insight and awareness. Lethargic Intact Memory--some moderate cognitive deficits.    Cranial nerve exam unremarkable.   MMT:  BUE 5/5 prox to distal.  RLE 4+/5 HF, KE and 5/5 ADF/PF.   LLE 4+/5 HF, KE and 5/5 ADF/PF.    Pt with mild proprioceptive deficits in LE's--improved   BLE  LE ataxia --improving  DTR's hyperreflexic in both patella, Achilles--reducing.  Bilateral upper extremity reflexes 2+.  Negative Hoffmann's. + Clonus on ankle jerk R 1-2 beats; no longer present on the left--improving No apparent tone   Assessment/Plan: 1. Functional deficits which require 3+ hours per day of interdisciplinary therapy in a comprehensive inpatient rehab setting. Physiatrist is providing close team supervision and 24 hour management of active medical problems listed below. Physiatrist and rehab team continue to assess barriers to discharge/monitor patient progress toward functional and medical goals  Care Tool:  Bathing    Body parts bathed by patient: Right arm, Left arm, Chest, Abdomen, Front perineal area, Right upper leg, Left upper leg, Face   Body parts bathed by helper: Right lower leg, Left lower leg, Buttocks  Bathing assist Assist Level: Minimal Assistance - Patient > 75%     Upper Body Dressing/Undressing Upper body dressing   What is the patient wearing?: Pull over shirt, Orthosis    Upper body assist Assist Level: Moderate Assistance - Patient 50 - 74%    Lower Body Dressing/Undressing Lower body dressing      What is the patient wearing?: Underwear/pull up, Pants     Lower body assist Assist for lower body dressing: Moderate Assistance - Patient 50 - 74%     Toileting Toileting    Toileting assist Assist for toileting: Minimal Assistance - Patient > 75%     Transfers Chair/bed transfer  Transfers assist     Chair/bed transfer assist level: Contact Guard/Touching assist      Locomotion Ambulation   Ambulation assist      Assist level: Contact Guard/Touching assist Assistive device: Walker-rolling Max distance: 150   Walk 10 feet activity   Assist     Assist level: Contact Guard/Touching assist Assistive device: Walker-rolling   Walk 50 feet activity   Assist    Assist level: Contact Guard/Touching assist Assistive device: Walker-rolling    Walk 150 feet activity   Assist Walk 150 feet activity did not occur: Safety/medical concerns (fatigue)  Assist level: Contact Guard/Touching assist Assistive device: Walker-rolling    Walk 10 feet on uneven surface  activity   Assist Walk 10 feet on uneven surfaces activity did not occur: Safety/medical concerns         Wheelchair     Assist Is the patient using a wheelchair?: Yes Type of Wheelchair: Manual    Wheelchair assist level: Supervision/Verbal cueing Max wheelchair distance: 150'    Wheelchair 50 feet with 2 turns activity    Assist        Assist Level: Supervision/Verbal cueing   Wheelchair 150 feet activity     Assist      Assist Level: Supervision/Verbal cueing   Blood pressure 139/69, pulse 79, temperature 97.8 F (36.6 C), resp. rate 15, height 5' 8 (1.727 m), weight 88 kg, SpO2 100%.  1. 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              -patient may shower if wound is covered             -ELOS/Goals: 11-13 days, mod I to supervision goals -- 8/27 DC  - Stable to continue inpatient rehab    - 8-14: Developing hyperreflexia R lower extremity greater than left lower extremity; generalized fatigue; abnormal eye rolling movements on exam.  EEG today, strength and sensation in the lower extremities is consistent with prior exams so initial hyperreflexia was likely just postop.  Will monitor closely for neurologic  changes.   8-15: EEG negative, eye rolling less apparent today.  Is improving strength in bilateral lower extremities, going from post-op hyporeflexia and hyperreflexic bilaterally.  Therapy performance improving today as compared to yesterday. monitor closely for neurologic changes over the weekend--per therapies, he does a poor job of maintaining spinal precautions in bed.   8/19: Caregiver support ?; uncertain of GF vs. Home nurse (2 weeks on discharge). Cog now improving to where he can repeat back precautions but too impulsive to follow them. CGA with gait 175 ft with RW; self-limiting. SPV to Min A cog; difficulty transitioning from structured to functional tasks.   2.  Antithrombotics: -DVT/anticoagulation:  Mechanical: Sequential compression devices,  below knee Bilateral lower extremities             -antiplatelet therapy: N/A--colchicine  for gout  3. Pain Management: Tylenol  prn mild pain or oxycodone  prn severe pain             -encourage pre-treatment with oxycodone  prior to therapies for better activity tolerance--tolerating today   - 8/13: Hx alcohol  abuse, severe neuropathic pain overlying radicular pain - will get B12, folate, B6 levels with next labs--B12 high, folate/B6 look okay--DC B12   8-14:  switch her over to tizanidine  every 8 hours as needed for spasticity; he also had hallucinations with baclofen in the past--doing better with tizanidine   Kpad ordered   - 8-18: Reduce oxycodone  to 5 mg 3 times daily as needed for severe pain, increase Tylenol  to 500 to 1000 mg 3 times daily as needed for mild to moderate pain    - 8/19: Schedule tylenol ; add dantrolene  25 mg daily for spasms, check LFTs in 1 week prior to increase. -- CMP 8/23  - Reports by caregiver patient was taking hydrocodone  at home; will return to home regimen at discharge--discussed this with patient 8-20  - 8-20: Add Lyrica  25 mg daily and 50 mg nightly for severe back pain, bilateral neuropathy  - 8/21:  Increase lyirca to 50 mg TID; add additional 50 mg nightly for burning pain.  Would increase duloxetine  next.  8-22: Too sedated with Lyrica  50/50/100. Increase duloxetine  to 60 mg in the morning, 30 mg at night; reduce standing lyrica  to 25 mg TID and keep 50 mg at bedtime PRN. Labs in am.  4. Mood/Behavior/Sleep: LCSW to follow for evaluation and support.              -antipsychotic agents: N/A  5. Neuropsych/cognition/Hx Wernicke's encephalopathy: This patient is capable of making decisions on his own behalf.   - Reviewed notes from [facility] neurology 10-2023; does not meet criteria for idiopathic Parkinson's at this time, but is undergoing current evaluation.  Has history of Wernicke's encephalopathy and was continued to drink at time of last exam.  - On thiamine , B12 supplement.  - 8/14: EEG for abnormal eye rolling movements on exam, fatigue; negative.  Talking with staff, was present on admission but is not as noticeable. 8-15: EEG negative.  Neuropsych noted some attention and memory deficits, but not profound.    - 8/19: Anticipate 100% supervision at home; discussed with the patient's caregiver, will get MRI given concerns for hitting his head during prior falls and possible overlying TBI--negative for acute changes, likely overlying concussion. Discussed results with patient and likely concussion 8-20 Will f/u with Neuro Dr. Evonnie as OP    6. Skin/Wound Care: Monitor incision for healing.  -dressing changes as needed. -Routine pressure relief measures.  -8/14: Appears with adhesive allergy on back; dressings removed. surgical site appears stable. - 8-15: Some fluctuance on the top and bottom of surgical site - nontender, no drainage, notably improving neurologic exam. Monitor for now.  8-18: Significant increase in fluctuance from last week, informed neurosurgery PA due to concern for reaccumulating seroma; continuing to make therapy progress-- Appreciate Suzen Pean NP coming in, no  concerning changes with wound  7. Fluids/Electrolytes/Nutrition: Monitor I/O. Check CMET in am.    8.  HTN: Monitor BP TID--on Amlodipine  and Avapro .   - Blood pressure stable on current regimen    12/15/2023    4:23 AM 12/14/2023    7:34 PM 12/14/2023    1:57 PM  Vitals with BMI  Systolic 139 130 882  Diastolic 69 69 70  Pulse 79 85 88    9.  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 on 08/07-->recheck labs in am  - 8-13: Stable creatinine, increased BUN on a.m. labs; encourage p.o. fluids, recheck in 2 days  8/18: Creatinine 1.41 , BUN improved, monitor  8/21: Cr 1.47, BUN up - PO fluids remain minimal - push today   - BMP in AM   11.  Anemia: Hgb 11.5 on 08/07--> recheck cbc in am.  Of note, has needed multiple blood transfusions s/p fall May of this year.  - 8-13: 10.5, within limits of variability.  Endorses no bleeding.  Trend, get iron studies, vitamin C, folate, B12 with next labs.   8/14: Fe low IDA despite supplement TID - in setting of CKD - may need venofer infusion if downtrends  8-18: Hemoglobin stable  9.  H/o Gout: continue colchicine .   10. Hypothyroid: continue synthroid  for supplement.   11. Anxiety/alcohol  abuse: continue Klonopin  at bedtime prn. - Provide education - 8-18: Caregiver reports estimate drinking about a bottle of wine per night at home prior to admission.  12. Hx bladder cancer: Treated with TURBT/infusion by Dr. Devere             -see #9             -monitor voiding patterns.             -I/O caths prn - voiding continent  13. Neurogenic bowel/constipation   Senakot changed to 2 Senna HS  Colace decreased to BID  - Last bowel movement 8-16, medium   - 8/19: Multiple BM this AM  - DC at bedtime sennakot, add PRN miralax  17 g daily   - 8/22: Increase to colace 200 mg bid  LOS: 10 days A FACE TO FACE EVALUATION WAS PERFORMED  Miguel Williams 12/15/2023, 10:01 AM

## 2023-12-15 NOTE — Progress Notes (Signed)
 Occupational Therapy Session Note  Patient Details  Name: Miguel Williams MRN: [id number] Date of Birth: 1946-03-12  Today's Date: 12/15/2023 OT Individual Time: 0803-0829 OT Individual Time Calculation (min): 26 min  OT Individual Time: 8567-8473 OT Individual Time Calculation (min): 54 min   Short Term Goals: Week 2:  OT Short Term Goal 1 (Week 2): STGs=LTGs due to patient's estimated length of stay.  Skilled Therapeutic Interventions/Progress Updates:     AM Session: Pt received sitting up in bed presenting to be in good spirits receptive to skilled OT session reporting 0/10 pain- OT offering intermittent rest breaks, repositioning, and therapeutic support to optimize participation in therapy session. Pt able to recall BLT precautions at beginning of session, however demonstrates increased challenges adhering to precautions during session. Requesting to change clothes. Doff/donned OH shirt with SUP. MOD verbal cues required for orientation of brace when donning EOB. Pt attempting to reach towards ground to weave feet into pants requiring MAX verbal cues to utilize reacher to weave feet into pants. He attempted to utilize reacher backwards, using handle to pick up pants requiring MAX verbal cues to correct. When standing to bring up pants, pt with heavy posterior lean, unable to correct with heavy MIN A required for balance. Pt mildly frustrated that OT was holding onto his gait belt to assist with balance- education provided on purpose to unsure safety. Engaged Pt in completing grooming/hygiene task of oral care in standing position for increased balance challenge- heavy MIN A required for balance and verbal cues for foot positioning to increase BOS. Seated rest break provided following. Groomed hair in seated position for energy conservation. Pt was left resting in wc with call bell in reach, seatbelt alarm on, and all needs met.    PM Session: Pt received sitting EOB presenting to be in good  spirits receptive to skilled OT session reporting 0/10 pain- OT offering intermittent rest breaks, repositioning, and therapeutic support to optimize participation in therapy session. Pt requesting to take shower. Focused this session on ADL retraining with focus on safety awareness, maintaining precautions, and activity tolerance. Pt able to verbalize back precautions at beginning of session, however he requires MAX verbal cues to implement and follow back precautions during functional tasks. Pt doff/donned back brace x3 trials this session prior to walking to/from bathroom and following UB dressing- MAX verbal cues required for orientation and positioning. Ambulatory transfers completed using RW with MIN A required for balance and RW management- heavily reliance on B UE support on RW. Pt attempting to doff underwear in standing, although he was taught in previous session this AM to sit when unweaving his feet to increase safety demonstrating poor carryover. Majority of U/LB bathing completed seated on bari-BSC in walk-in shower for energy conservation and safety. Able to utilize long handled sponge to wash lower B LEs, back, and buttocks maintaining back precautions with MOD verbal cues. Following shower, Pt donned OH shirt with set-up A. MAX verbal cues required to recall technique for using reacher when weaving feet into pants/underwear and MIN A provided for standing balance when bringing pants to waist. Grooming/hygiene tasks completed in seated position to increase safety. Pt requesting to return to bed at end of session. He completed short distance functional mobility back to bed using RW with MAX verbal cues required for safety and RW positioning when navigating around obstacles in room. EOB >Supine SUP. Pt limited by poor memory, decreased problem solving skills, decreased safety awareness, and decreased insight into deficits this session. Pt  was left resting in bed with call bell in reach, bed alarm on,  and all needs met.    Therapy Documentation Precautions:  Precautions Precautions: Back, Fall Required Braces or Orthoses: Spinal Brace Spinal Brace: Lumbar corset, Applied in sitting position Restrictions Weight Bearing Restrictions Per Provider Order: No   Therapy/Group: Individual Therapy  Katheryn SHAUNNA Mines 12/15/2023, 7:53 AM

## 2023-12-15 NOTE — Progress Notes (Signed)
 Speech Language Pathology Daily Session Note  Patient Details  Name: Miguel Williams MRN: [id number] Date of Birth: 10-13-1945  Today's Date: 12/15/2023 SLP Individual Time: 0837-0920 SLP Individual Time Calculation (min): 43 min  Short Term Goals: Week 2: SLP Short Term Goal 1 (Week 2): STGs=LTGs d/t ELOS  Skilled Therapeutic Interventions: SLP conducted skilled therapy session targeting cognitive goals. SLP targeted working memory through letter sequencing task and verbal word list recall and manipulation task. Patient benefited from supervision during letter sequencing and min assist for recall during manipulation. Of note, patient endorses need to follow reaching precautions but was observed to reach x6 throughout session despite verbal cues. Patient quick with excuses re: why he continues to reach, primarily that he is left handed, thus uses his left hand dominantly. Reinforced importance of adhering to precautions. Patient was left in room with call bell in reach and alarm set. SLP will continue to target goals per plan of care.        Pain Pain Assessment Pain Scale: 0-10 Pain Score: 6  Pain Location: Leg Pain Intervention(s): Medication (See eMAR)  Therapy/Group: Individual Therapy  Miguel Williams, M.A., CCC-SLP  Miguel Williams A Miguel Williams 12/15/2023, 10:00 AM

## 2023-12-15 NOTE — Progress Notes (Signed)
 Physical Therapy Session Note  Patient Details  Name: Miguel Williams MRN: [id number] Date of Birth: 12-14-1945  Today's Date: 12/15/2023 PT Individual Time: 1005-1100 PT Individual Time Calculation (min): 55 min   Short Term Goals: Week 2:  PT Short Term Goal 1 (Week 2): STG = LTG due to ELOS  Skilled Therapeutic Interventions/Progress Updates:    Pt presents in room and agreeable to PT. Pt denies pain in back, reports some nerve pain in BLEs that is improved with heating pad on BLEs. Pt reporting increased fatigue. Session focused on therapeutic activities for education on surgery as well as therapeutic prognosis and tolerance to upright. and NMR for dynamic standing balance, single limb stability, and foot clearance. Pt educated extensively on initial surgery and follow up surgery as pt reporting that he does not remember why he had the second surgery. Pt inquiring about prognosis with therapist providing input on expected level of assistance at DC and likely need for assistance following DC from hospital due to unable to drive and ongoing cognitive deficits with pt verbalizing understanding. Pt completes bed mobility with supervision via log roll. Pt completes sit to stand with CGA to RW. Pt ambulates with RW to day room with CGA and cues for upright posture, completed for upright tolerance, with pt demonstrating increased fatigue and bilateral knee flexion in stance this session. Pt comes to sitting in WC in day room for rest break. Pt then completes NMR for dynamic standing balance, foot clearance, single limb stability, and upright tolerance with unilateral UE support including: - step tap 4 step with LUE support on WBQC 2x10 BLE - gait 2x40' with [facility] on L cues for sequencing stepping (min assist progress to heavy min with fatigue) - standing tolerance unilateral UE support on RW hooking horseshoes on mirror 1:20 min and Pt provided with cues for sitting between exercises to increase time  for sitting, decrease speed of descent and improve eccentric control with pt requiring max fade to min cues. Pt returns to room and completes stand step transfer with [facility] to bed. Pt completes log roll with supervision and pt remains semi reclined with all needs within reach, cal light in place and bed alarm activated at end of session.   Therapy Documentation Precautions:  Precautions Precautions: Back, Fall Required Braces or Orthoses: Spinal Brace Spinal Brace: Lumbar corset, Applied in sitting position Restrictions Weight Bearing Restrictions Per Provider Order: No   Therapy/Group: Individual Therapy  Reche Ohara PT, DPT 12/15/2023, 1:10 PM

## 2023-12-16 DIAGNOSIS — M48062 Spinal stenosis, lumbar region with neurogenic claudication: Secondary | ICD-10-CM | POA: Diagnosis not present

## 2023-12-16 LAB — COMPREHENSIVE METABOLIC PANEL WITH GFR
ALT: 17 U/L (ref 0–44)
AST: 21 U/L (ref 15–41)
Albumin: 3.7 g/dL (ref 3.5–5.0)
Alkaline Phosphatase: 118 U/L (ref 38–126)
Anion gap: 9 (ref 5–15)
BUN: 33 mg/dL — ABNORMAL HIGH (ref 8–23)
CO2: 21 mmol/L — ABNORMAL LOW (ref 22–32)
Calcium: 8.8 mg/dL — ABNORMAL LOW (ref 8.9–10.3)
Chloride: 107 mmol/L (ref 98–111)
Creatinine, Ser: 1.61 mg/dL — ABNORMAL HIGH (ref 0.61–1.24)
GFR, Estimated: 44 mL/min — ABNORMAL LOW (ref >60)
Glucose, Bld: 122 mg/dL — ABNORMAL HIGH (ref 70–99)
Potassium: 4.3 mmol/L (ref 3.5–5.1)
Sodium: 137 mmol/L (ref 135–145)
Total Bilirubin: 0.5 mg/dL (ref 0.0–1.2)
Total Protein: 5.8 g/dL — ABNORMAL LOW (ref 6.5–8.1)

## 2023-12-16 MED ORDER — SODIUM CHLORIDE 0.9 % IV BOLUS
1000.0000 mL | Freq: Once | INTRAVENOUS | Status: AC
  Administered 2023-12-16: 1000 mL via INTRAVENOUS

## 2023-12-16 MED ORDER — SORBITOL 70 % SOLN
30.0000 mL | Freq: Once | Status: AC
  Administered 2023-12-16: 30 mL via ORAL

## 2023-12-16 MED ORDER — SENNA 8.6 MG PO TABS
1.0000 | ORAL_TABLET | Freq: Every day | ORAL | Status: DC
  Administered 2023-12-16 – 2023-12-17 (×2): 8.6 mg via ORAL
  Filled 2023-12-16 (×2): qty 1

## 2023-12-16 MED ORDER — DANTROLENE SODIUM 25 MG PO CAPS
25.0000 mg | ORAL_CAPSULE | Freq: Three times a day (TID) | ORAL | Status: DC
  Administered 2023-12-16 – 2023-12-20 (×12): 25 mg via ORAL
  Filled 2023-12-16 (×15): qty 1

## 2023-12-16 NOTE — Progress Notes (Signed)
 Occupational Therapy Session Note  Patient Details  Name: Miguel Williams MRN: [id number] Date of Birth: 23-Jul-1945  Today's Date: 12/16/2023 OT Individual Time: 8994-8884 OT Individual Time Calculation (min): 70 min    Short Term Goals: Week 2:  OT Short Term Goal 1 (Week 2): STGs=LTGs due to patient's estimated length of stay.  Skilled Therapeutic Interventions/Progress Updates:  Skilled OT intervention completed with focus on ADL retraining, BP management and education. Pt received upright in bed, agreeable to session. No pain reported.  Pt declined bathing, but did request to change clothes/shave. Transitioned EOB with good adherence to back precautions. Doffed/donned shirt with set up A, LSO with supervision. CGA sit > stand and ambulatory transfer using RW > w/c with mod cues needed for RW proximity and to control descent however pt with poor eccentric control to w/c. Set up A for grooming and oral care at sink.   Pt able to recall back precautions, with good recall of not bending to manage socks and shoes. Able to achieve figure 4 position for doffing/donning of socks and tennis shoes. Suggested use of reacher to retrieve shoes from floor for back precautions.  Stood CGA using RW, then ambulated 10 ft with CGA however pt endorsed light headedness therefore returned sitting.  BP assessed as follows:  Vitals -BP 94/58 (sitting after ADLs); symptomatic  -Anticipate inaccurate reading of 161/120 due to pt gripping RUE on RW heavily due to poor standing tolerance  -BP 88/60 (sitting after standing for ~1 min); symptomatic -BP 71/55 (after sitting several mins) -BP 109/68 (after transfer > EOB)  Issued pt water  to encourage fluids, attempted BP in stance for orthostatics, however pt unable to maintain without excessive grip on RW due to weak legs. BP continued to drop therefore transported pt to room and ambulated with CGA > EOB. Improved with ambulation trial. Transitioned EOB > semi  upright with supervision. Pt remained semi upright in bed, with bed alarm on/activated, and with all needs in reach at end of session. Nurse made aware of pt status.   Therapy Documentation Precautions:  Precautions Precautions: Back, Fall Required Braces or Orthoses: Spinal Brace Spinal Brace: Lumbar corset, Applied in sitting position Restrictions Weight Bearing Restrictions Per Provider Order: No    Therapy/Group: Individual Therapy  Lorrayne FORBES Fritter, MS, OTR/L  12/16/2023, 1:04 PM

## 2023-12-16 NOTE — Progress Notes (Signed)
 PROGRESS NOTE   Subjective/Complaints: No events overnight.   Reporting ongoing 7 out of 10 back pain.  Hypotensive this a.m., 107/58, and with reduced therapy tolerance with gait 2 x 40 feet with min assist progressing to heavy assist for fatigue.  Per patient, limited mostly by lightheadedness, not weakness.  Did get as needed Zanaflex  at 8 AM today.  A.m. labs significant for AKI with creatinine 1.6, BUN 33.  LFTs remain normal.  No further lethargy with reduction of Lyrica , and he does feel like the peripheral burning is improved, although not totally absent.  ROS: Denies fevers, chills, N/V, abdominal pain, constipation, diarrhea, SOB, cough, chest pain, new weakness or paraesthesias.   + Bilateral lower extremity spasms --improving + Bilateral lower extremity burning pain--improving + Lethargy --improved + Orthostasis--new  Objective:   No results found.   Recent Labs    12/14/23 0522  WBC 5.9  HGB 11.2*  HCT 33.9*  PLT 280   Recent Labs    12/14/23 0522 12/16/23 1039  NA 140 137  K 4.2 4.3  CL 106 107  CO2 21* 21*  GLUCOSE 110* 122*  BUN 25* 33*  CREATININE 1.47* 1.61*  CALCIUM  8.7* 8.8*    Intake/Output Summary (Last 24 hours) at 12/16/2023 1235 Last data filed at 12/16/2023 0448 Gross per 24 hour  Intake 720 ml  Output 600 ml  Net 120 ml        Physical Exam: Vital Signs Blood pressure (!) 107/58, pulse 73, temperature 97.9 F (36.6 C), resp. rate 18, height 5' 8 (1.727 m), weight 88 kg, SpO2 93%.  Constitutional: No apparent distress.  Laying in bed. Awake  HENT: No JVD. Neck Supple. Trachea midline. Atraumatic, normocephalic. Eyes: PERRLA. EOMI. Visual fields grossly intact.  + rolling of right eye during fixation intermittently--ongoing, less apparent today  Cardiovascular: RRR, no murmurs/rub/gallops. No Edema.  Respiratory: CTAB. No rales, rhonchi, or wheezing. On RA.  Abdomen: +  bowel sounds, normoactive. No distention or tenderness.  Skin: C/D/I.   -Surgical site well-approximated -More fluctuance at the top of the surgical site than the bottom, some along R lateral margin as well,  no erythema or drainage.   MSK:      No apparent deformity.    Neuro: Alert and oriented x 3. Normal insight and awareness.  +  moderate cognitive deficits.  Moderate memory deficits  Cranial nerve exam unremarkable.   MMT:  BUE 5/5 prox to distal.  RLE 4+/5 HF, KE and 5/5 ADF/PF.   LLE 4+/5 HF, KE and 5/5 ADF/PF.   Sensation intact   BLE  LE ataxia --improving  DTR's hyperreflexic in both patella, Achilles  Bilateral upper extremity reflexes 2+.  Negative Hoffmann's. + Clonus on ankle jerk R 3-4 beats; no longer present on the left  No apparent tone   Assessment/Plan: 1. Functional deficits which require 3+ hours per day of interdisciplinary therapy in a comprehensive inpatient rehab setting. Physiatrist is providing close team supervision and 24 hour management of active medical problems listed below. Physiatrist and rehab team continue to assess barriers to discharge/monitor patient progress toward functional and medical goals  Care Tool:  Bathing  Body parts bathed by patient: Right arm, Left arm, Chest, Abdomen, Front perineal area, Right upper leg, Left upper leg, Face   Body parts bathed by helper: Right lower leg, Left lower leg, Buttocks     Bathing assist Assist Level: Minimal Assistance - Patient > 75%     Upper Body Dressing/Undressing Upper body dressing   What is the patient wearing?: Pull over shirt, Orthosis    Upper body assist Assist Level: Moderate Assistance - Patient 50 - 74%    Lower Body Dressing/Undressing Lower body dressing      What is the patient wearing?: Underwear/pull up, Pants     Lower body assist Assist for lower body dressing: Moderate Assistance - Patient 50 - 74%     Toileting Toileting    Toileting assist  Assist for toileting: Minimal Assistance - Patient > 75%     Transfers Chair/bed transfer  Transfers assist     Chair/bed transfer assist level: Contact Guard/Touching assist     Locomotion Ambulation   Ambulation assist      Assist level: Contact Guard/Touching assist Assistive device: Walker-rolling Max distance: 150   Walk 10 feet activity   Assist     Assist level: Contact Guard/Touching assist Assistive device: Walker-rolling   Walk 50 feet activity   Assist    Assist level: Contact Guard/Touching assist Assistive device: Walker-rolling    Walk 150 feet activity   Assist Walk 150 feet activity did not occur: Safety/medical concerns (fatigue)  Assist level: Contact Guard/Touching assist Assistive device: Walker-rolling    Walk 10 feet on uneven surface  activity   Assist Walk 10 feet on uneven surfaces activity did not occur: Safety/medical concerns         Wheelchair     Assist Is the patient using a wheelchair?: Yes Type of Wheelchair: Manual    Wheelchair assist level: Supervision/Verbal cueing Max wheelchair distance: 150'    Wheelchair 50 feet with 2 turns activity    Assist        Assist Level: Supervision/Verbal cueing   Wheelchair 150 feet activity     Assist      Assist Level: Supervision/Verbal cueing   Blood pressure (!) 107/58, pulse 73, temperature 97.9 F (36.6 C), resp. rate 18, height 5' 8 (1.727 m), weight 88 kg, SpO2 93%.  1. 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              -patient may shower if wound is covered             -ELOS/Goals: 11-13 days, mod I to supervision goals -- 8/27 DC  - Stable to continue inpatient rehab    - 8-14: Developing hyperreflexia R lower extremity greater than left lower extremity; generalized fatigue; abnormal eye rolling movements on  exam.  EEG today, strength and sensation in the lower extremities is consistent with prior exams so initial hyperreflexia was likely just postop.  Will monitor closely for neurologic changes.   8-15: EEG negative, eye rolling less apparent today.  Is improving strength in bilateral lower extremities, going from post-op hyporeflexia and hyperreflexic bilaterally.  Therapy performance improving today as compared to yesterday. monitor closely for neurologic changes over the weekend--per therapies, he does a poor job of maintaining spinal precautions in bed.   8/19: Caregiver support ?; uncertain of GF vs. Home nurse (2 weeks on discharge). Cog now improving to where he can  repeat back precautions but too impulsive to follow them. CGA with gait 175 ft with RW; self-limiting. SPV to Min A cog; difficulty transitioning from structured to functional tasks.   2.  Antithrombotics: -DVT/anticoagulation:  Mechanical: Sequential compression devices, below knee Bilateral lower extremities             -antiplatelet therapy: N/A--colchicine  for gout  3. Pain Management: Tylenol  prn mild pain or oxycodone  prn severe pain             -encourage pre-treatment with oxycodone  prior to therapies for better activity tolerance--tolerating today   - 8/13: Hx alcohol  abuse, severe neuropathic pain overlying radicular pain - will get B12, folate, B6 levels with next labs--B12 high, folate/B6 look okay--DC B12   8-14:  switch her over to tizanidine  every 8 hours as needed for spasticity; he also had hallucinations with baclofen in the past--doing better with tizanidine   Kpad ordered   - 8-18: Reduce oxycodone  to 5 mg 3 times daily as needed for severe pain, increase Tylenol  to 500 to 1000 mg 3 times daily as needed for mild to moderate pain    - 8/19: Schedule tylenol ; add dantrolene  25 mg daily for spasms, check LFTs in 1 week prior to increase. -- CMP 8/23  - Reports by caregiver patient was taking hydrocodone  at home;  will return to home regimen at discharge--discussed this with patient 8-20  - 8-20: Add Lyrica  25 mg daily and 50 mg nightly for severe back pain, bilateral neuropathy  - 8/21: Increase lyirca to 50 mg TID; add additional 50 mg nightly for burning pain.  Would increase duloxetine  next.  8-22: Too sedated with Lyrica  50/50/100. Increase duloxetine  to 60 mg in the morning, 30 mg at night; reduce standing lyrica  to 25 mg TID and keep 50 mg at bedtime PRN. Labs in am.  8/23: Tolerating current dose of Lyrica  better.  DC tizanidine , with normal LFTs will switch to dantrolene  25 mg 3 times daily today for spasms.  4. Mood/Behavior/Sleep: LCSW to follow for evaluation and support.              -antipsychotic agents: N/A  5. Neuropsych/cognition/Hx Wernicke's encephalopathy: This patient is capable of making decisions on his own behalf.   - Reviewed notes from [facility] neurology 10-2023; does not meet criteria for idiopathic Parkinson's at this time, but is undergoing current evaluation.  Has history of Wernicke's encephalopathy and was continued to drink at time of last exam.  - On thiamine , B12 supplement.  - 8/14: EEG for abnormal eye rolling movements on exam, fatigue; negative.  Talking with staff, was present on admission but is not as noticeable. 8-15: EEG negative.  Neuropsych noted some attention and memory deficits, but not profound.    - 8/19: Anticipate 100% supervision at home; discussed with the patient's caregiver, will get MRI given concerns for hitting his head during prior falls and possible overlying TBI--negative for acute changes, likely overlying concussion. Discussed results with patient and likely concussion 8-20 Will f/u with Neuro Dr. Evonnie as OP    6. Skin/Wound Care: Monitor incision for healing.  -dressing changes as needed. -Routine pressure relief measures.  -8/14: Appears with adhesive allergy on back; dressings removed. surgical site appears stable. - 8-15: Some fluctuance  on the top and bottom of surgical site - nontender, no drainage, notably improving neurologic exam. Monitor for now.  8-18: Significant increase in fluctuance from last week, informed neurosurgery PA due to concern for reaccumulating seroma; continuing to  make therapy progress-- Appreciate Suzen Pean NP coming in, no concerning changes with wound  7. Fluids/Electrolytes/Nutrition: Monitor I/O. Check CMET in am.    8.  HTN: Monitor BP TID--on Amlodipine  and Avapro .   - Blood pressure stable on current regimen    12/16/2023    4:49 AM 12/15/2023    8:56 PM 12/15/2023    4:23 AM  Vitals with BMI  Systolic 107 121 860  Diastolic 58 66 69  Pulse 73 79 79    9.  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 on 08/07-->recheck labs in am  - 8-13: Stable creatinine, increased BUN on a.m. labs; encourage p.o. fluids, recheck in 2 days  8/18: Creatinine 1.41 , BUN improved, monitor  8/21: Cr 1.47, BUN up - PO fluids remain minimal - push today  8-23: Creatinine 1.6, will give 1 L IV fluid bolus today.  Repeat in AM.  11.  Anemia: Hgb 11.5 on 08/07--> recheck cbc in am.  Of note, has needed multiple blood transfusions s/p fall May of this year.  - 8-13: 10.5, within limits of variability.  Endorses no bleeding.  Trend, get iron studies, vitamin C, folate, B12 with next labs.   8/14: Fe low IDA despite supplement TID - in setting of CKD - may need venofer infusion if downtrends  8-18: Hemoglobin stable  9.  H/o Gout: continue colchicine .   10. Hypothyroid: continue synthroid  for supplement.   11. Anxiety/alcohol  abuse: continue Klonopin  at bedtime prn. - Provide education - 8-18: Caregiver reports estimate drinking about a bottle of wine per night at home prior to admission.  12. Hx bladder cancer: Treated with TURBT/infusion by Dr. Devere             -see #9             -monitor voiding patterns.             -I/O caths  prn - voiding continent  13. Neurogenic bowel/constipation   Senakot changed to 2 Senna HS  Colace decreased to BID  - Last bowel movement 8-16, medium   - 8/19: Multiple BM this AM  - DC at bedtime sennakot, add PRN miralax  17 g daily   - 8/22: Increase to colace 200 mg bid  - 8/23: Add bowel program ordered but was not being done?  DC.  Resume Senokot nightly.  Give sorbitol  today.  14.  Orthostatic hypotension.  Likely due to dehydration plus tizanidine  as above.  - Give 1 L IV fluids today  - Repeat labs in a.m.  - DC tizanidine ; switch to dantrolene  for muscle spasms  LOS: 11 days A FACE TO FACE EVALUATION WAS PERFORMED  Miguel Williams 12/16/2023, 12:35 PM

## 2023-12-17 DIAGNOSIS — M48062 Spinal stenosis, lumbar region with neurogenic claudication: Secondary | ICD-10-CM | POA: Diagnosis not present

## 2023-12-17 LAB — BASIC METABOLIC PANEL WITH GFR
Anion gap: 6 (ref 5–15)
BUN: 31 mg/dL — ABNORMAL HIGH (ref 8–23)
CO2: 20 mmol/L — ABNORMAL LOW (ref 22–32)
Calcium: 8.6 mg/dL — ABNORMAL LOW (ref 8.9–10.3)
Chloride: 115 mmol/L — ABNORMAL HIGH (ref 98–111)
Creatinine, Ser: 1.29 mg/dL — ABNORMAL HIGH (ref 0.61–1.24)
GFR, Estimated: 57 mL/min — ABNORMAL LOW (ref >60)
Glucose, Bld: 114 mg/dL — ABNORMAL HIGH (ref 70–99)
Potassium: 5.1 mmol/L (ref 3.5–5.1)
Sodium: 141 mmol/L (ref 135–145)

## 2023-12-17 LAB — CBC WITH DIFFERENTIAL/PLATELET
Abs Immature Granulocytes: 0.06 K/uL (ref 0.00–0.07)
Basophils Absolute: 0.1 K/uL (ref 0.0–0.1)
Basophils Relative: 1 %
Eosinophils Absolute: 0.5 K/uL (ref 0.0–0.5)
Eosinophils Relative: 9 %
HCT: 34.7 % — ABNORMAL LOW (ref 39.0–52.0)
Hemoglobin: 11.3 g/dL — ABNORMAL LOW (ref 13.0–17.0)
Immature Granulocytes: 1 %
Lymphocytes Relative: 23 %
Lymphs Abs: 1.2 K/uL (ref 0.7–4.0)
MCH: 30.6 pg (ref 26.0–34.0)
MCHC: 32.6 g/dL (ref 30.0–36.0)
MCV: 94 fL (ref 80.0–100.0)
Monocytes Absolute: 0.6 K/uL (ref 0.1–1.0)
Monocytes Relative: 10 %
Neutro Abs: 2.9 K/uL (ref 1.7–7.7)
Neutrophils Relative %: 56 %
Platelets: 261 K/uL (ref 150–400)
RBC: 3.69 MIL/uL — ABNORMAL LOW (ref 4.22–5.81)
RDW: 13.4 % (ref 11.5–15.5)
WBC: 5.3 K/uL (ref 4.0–10.5)
nRBC: 0 % (ref 0.0–0.2)

## 2023-12-17 LAB — AMMONIA: Ammonia: 30 umol/L (ref 9–35)

## 2023-12-17 MED ORDER — SENNA 8.6 MG PO TABS
2.0000 | ORAL_TABLET | Freq: Every day | ORAL | Status: DC
  Administered 2023-12-18 – 2023-12-19 (×2): 17.2 mg via ORAL
  Filled 2023-12-17 (×2): qty 2

## 2023-12-17 MED ORDER — PREGABALIN 50 MG PO CAPS
50.0000 mg | ORAL_CAPSULE | Freq: Three times a day (TID) | ORAL | Status: DC
  Administered 2023-12-17 – 2023-12-18 (×4): 50 mg via ORAL
  Filled 2023-12-17 (×4): qty 1

## 2023-12-17 MED ORDER — PREGABALIN 25 MG PO CAPS
25.0000 mg | ORAL_CAPSULE | Freq: Every evening | ORAL | Status: DC | PRN
  Administered 2023-12-18 – 2023-12-19 (×2): 25 mg via ORAL
  Filled 2023-12-17 (×3): qty 1

## 2023-12-17 NOTE — Progress Notes (Signed)
 PROGRESS NOTE   Subjective/Complaints: No events overnight.  Blood pressure much improved after IV fluids yesterday. A.m. labs with resolved AKI. Had sorbitol  yesterday, without results, requested enema this a.m.  Positive for large bowel movement.  Patient complaining of worsening burning in his feet, now extending up to his mid shins.  Says he no longer feels drowsy on the Lyrica , and requested to be increased to his prior dose.  Patient expresses fears about being unable to walk, I do not want to be in a wheelchair for the rest of my life.  We discussed that he is ambulating with therapies well, but was limited yesterday because of blood pressure problems.  Day prior, was ambulating 180 feet with contact-guard assist with a walker.  ROS: Denies fevers, chills, N/V, abdominal pain, constipation, diarrhea, SOB, cough, chest pain, new weakness or paraesthesias.   + Bilateral lower extremity spasms --improving + Bilateral lower extremity burning pain--ongoing + Lethargy --improved + Orthostasis--improved  Objective:   No results found.   Recent Labs    12/17/23 0511  WBC 5.3  HGB 11.3*  HCT 34.7*  PLT 261   Recent Labs    12/16/23 1039 12/17/23 0511  NA 137 141  K 4.3 5.1  CL 107 115*  CO2 21* 20*  GLUCOSE 122* 114*  BUN 33* 31*  CREATININE 1.61* 1.29*  CALCIUM  8.8* 8.6*    Intake/Output Summary (Last 24 hours) at 12/17/2023 2139 Last data filed at 12/17/2023 1829 Gross per 24 hour  Intake 1114 ml  Output 2400 ml  Net -1286 ml        Physical Exam: Vital Signs Blood pressure 136/69, pulse 84, temperature 98.5 F (36.9 C), temperature source Oral, resp. rate 18, height 5' 8 (1.727 m), weight 88 kg, SpO2 96%.  Constitutional: No apparent distress.  Sitting upright in bed.  Awake  HENT: No JVD. Neck Supple. Trachea midline. Atraumatic, normocephalic. Eyes: PERRLA. EOMI. Visual fields grossly intact.   + rolling of right eye during fixation intermittently--significantly less today  Cardiovascular: RRR, no murmurs/rub/gallops. No Edema.  Respiratory: CTAB. No rales, rhonchi, or wheezing. On RA.  Abdomen: + bowel sounds, normoactive. No distention or tenderness.  Skin: C/D/I.   -Surgical site well-approximated -palpable fluctuance at the top of the surgical site and along R lateral margin,   no erythema or drainage--stable from prior exams.  MSK:      No apparent deformity.  Moving all 4 limbs full AROM    Neuro: Alert and oriented x 3. Normal insight and awareness.  +  moderate cognitive deficits.  Moderate memory deficits  Cranial nerve exam unremarkable.   MMT:  BUE 5/5 prox to distal.  RLE 4+/5 HF, KE and 5/5 ADF/PF.   LLE 4+/5 HF, KE and 5/5 ADF/PF.    Altered sensation to light touch in bilateral lower extremities extending from the feet to the mid shin.   BLE  LE ataxia -improving  DTR's hyperreflexic in both patella, Achilles  Bilateral upper extremity reflexes 2+.  Negative Hoffmann's. + Clonus on ankle jerk R 3-4 beats; 1-2 beats on the left  No apparent tone--consistent with prior exams   Assessment/Plan: 1. Functional deficits  which require 3+ hours per day of interdisciplinary therapy in a comprehensive inpatient rehab setting. Physiatrist is providing close team supervision and 24 hour management of active medical problems listed below. Physiatrist and rehab team continue to assess barriers to discharge/monitor patient progress toward functional and medical goals  Care Tool:  Bathing    Body parts bathed by patient: Right arm, Left arm, Chest, Abdomen, Front perineal area, Right upper leg, Left upper leg, Face   Body parts bathed by helper: Right lower leg, Left lower leg, Buttocks     Bathing assist Assist Level: Minimal Assistance - Patient > 75%     Upper Body Dressing/Undressing Upper body dressing   What is the patient wearing?: Pull over shirt,  Orthosis    Upper body assist Assist Level: Moderate Assistance - Patient 50 - 74%    Lower Body Dressing/Undressing Lower body dressing      What is the patient wearing?: Underwear/pull up, Pants     Lower body assist Assist for lower body dressing: Moderate Assistance - Patient 50 - 74%     Toileting Toileting    Toileting assist Assist for toileting: Minimal Assistance - Patient > 75%     Transfers Chair/bed transfer  Transfers assist     Chair/bed transfer assist level: Contact Guard/Touching assist     Locomotion Ambulation   Ambulation assist      Assist level: Contact Guard/Touching assist Assistive device: Walker-rolling Max distance: 150   Walk 10 feet activity   Assist     Assist level: Contact Guard/Touching assist Assistive device: Walker-rolling   Walk 50 feet activity   Assist    Assist level: Contact Guard/Touching assist Assistive device: Walker-rolling    Walk 150 feet activity   Assist Walk 150 feet activity did not occur: Safety/medical concerns (fatigue)  Assist level: Contact Guard/Touching assist Assistive device: Walker-rolling    Walk 10 feet on uneven surface  activity   Assist Walk 10 feet on uneven surfaces activity did not occur: Safety/medical concerns         Wheelchair     Assist Is the patient using a wheelchair?: Yes Type of Wheelchair: Manual    Wheelchair assist level: Supervision/Verbal cueing Max wheelchair distance: 150'    Wheelchair 50 feet with 2 turns activity    Assist        Assist Level: Supervision/Verbal cueing   Wheelchair 150 feet activity     Assist      Assist Level: Supervision/Verbal cueing   Blood pressure 136/69, pulse 84, temperature 98.5 F (36.9 C), temperature source Oral, resp. rate 18, height 5' 8 (1.727 m), weight 88 kg, SpO2 96%.  1. Functional deficits secondary to lumbar myelopathy s/p decompression and fusion with subsequent hematoma  collection which required re-exploration and wash out.              -patient may shower if wound is covered             -ELOS/Goals: 11-13 days, mod I to supervision goals -- 8/27 DC  - Stable to continue inpatient rehab    - 8-14: Developing hyperreflexia R lower extremity greater than left lower extremity; generalized fatigue; abnormal eye rolling movements on exam.  EEG today, strength and sensation in the lower extremities is consistent with prior exams so initial hyperreflexia was likely just postop.  Will monitor closely for neurologic changes.   8-15: EEG negative, eye rolling less apparent today.  Is improving strength in bilateral lower extremities, going  from post-op hyporeflexia and hyperreflexic bilaterally.  Therapy performance improving today as compared to yesterday. monitor closely for neurologic changes over the weekend--per therapies, he does a poor job of maintaining spinal precautions in bed.   8/19: Caregiver support ?; uncertain of GF vs. Home nurse (2 weeks on discharge). Cog now improving to where he can repeat back precautions but too impulsive to follow them. CGA with gait 175 ft with RW; self-limiting. SPV to Min A cog; difficulty transitioning from structured to functional tasks.   2.  Antithrombotics: -DVT/anticoagulation:  Mechanical: Sequential compression devices, below knee Bilateral lower extremities             -antiplatelet therapy: N/A--colchicine  for gout  3. Pain Management: Tylenol  prn mild pain or oxycodone  prn severe pain             -encourage pre-treatment with oxycodone  prior to therapies for better activity tolerance--tolerating today   - 8/13: Hx alcohol  abuse, severe neuropathic pain overlying radicular pain - will get B12, folate, B6 levels with next labs--B12 high, folate/B6 look okay--DC B12   8-14:  switch her over to tizanidine  every 8 hours as needed for spasticity; he also had hallucinations with baclofen in the past--doing better with  tizanidine   Kpad ordered   - 8-18: Reduce oxycodone  to 5 mg 3 times daily as needed for severe pain, increase Tylenol  to 500 to 1000 mg 3 times daily as needed for mild to moderate pain    - 8/19: Schedule tylenol ; add dantrolene  25 mg daily for spasms, check LFTs in 1 week prior to increase. -- CMP 8/23 with LFTs normal  - Reports by caregiver patient was taking hydrocodone  at home; will return to home regimen at discharge--discussed this with patient 8-20  - 8-20: Add Lyrica  25 mg daily and 50 mg nightly for severe back pain, bilateral neuropathy  - 8/21: Increase lyirca to 50 mg TID; add additional 50 mg nightly for burning pain.  Would increase duloxetine  next.  8-22: Too sedated with Lyrica  50/50/100. Increase duloxetine  to 60 mg in the morning, 30 mg at night; reduce standing lyrica  to 25 mg TID and keep 50 mg at bedtime PRN. Labs in am.  8/23: Tolerating current dose of Lyrica  better.  DC tizanidine , with normal LFTs will switch to dantrolene  25 mg 3 times daily today for spasms. 8-24: Per chart, increase Lyrica  to 50 mg 3 times daily.  Will reduce nighttime as needed to 25 mg to reduce likelihood of daytime sedation.  4. Mood/Behavior/Sleep: LCSW to follow for evaluation and support.              -antipsychotic agents: N/A  5. Neuropsych/cognition/Hx Wernicke's encephalopathy: This patient is capable of making decisions on his own behalf.   - Reviewed notes from [facility] neurology 10-2023; does not meet criteria for idiopathic Parkinson's at this time, but is undergoing current evaluation.  Has history of Wernicke's encephalopathy and was continued to drink at time of last exam.  - On thiamine , B12 supplement.  - 8/14: EEG for abnormal eye rolling movements on exam, fatigue; negative.  Talking with staff, was present on admission but is not as noticeable. 8-15: EEG negative.  Neuropsych noted some attention and memory deficits, but not profound.    - 8/19: Anticipate 100% supervision at  home; discussed with the patient's caregiver, will get MRI given concerns for hitting his head during prior falls and possible overlying TBI--negative for acute changes, likely overlying concussion. Discussed results with patient and  likely concussion 8-20 Will f/u with Neuro Dr. Evonnie as OP   -discussed eye rolling behavior with Dr. Michaela, likely secondary to [facility] and exacerbated by concussion.  No further workup warranted.  6. Skin/Wound Care: Monitor incision for healing.  -dressing changes as needed. -Routine pressure relief measures.  -8/14: Appears with adhesive allergy on back; dressings removed. surgical site appears stable. - 8-15: Some fluctuance on the top and bottom of surgical site - nontender, no drainage, notably improving neurologic exam. Monitor for now.  8-18: Significant increase in fluctuance from last week, informed neurosurgery PA due to concern for reaccumulating seroma; continuing to make therapy progress-- Appreciate Suzen Pean NP coming in, no concerning changes with wound -has had some very mild increase in fluctuance along the right lateral margin but overall remains unchanged  7. Fluids/Electrolytes/Nutrition: Monitor I/O. Check CMET in am.    - Ca Below  8.  HTN: Monitor BP TID--on Amlodipine  and Avapro .   - Blood pressure stable on current regimen    12/17/2023    8:08 PM 12/17/2023    4:48 AM 12/16/2023    8:51 PM  Vitals with BMI  Systolic 136 123 872  Diastolic 69 58 65  Pulse 84 69 79    9.  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 on 08/07-->recheck labs in am  - 8-13: Stable creatinine, increased BUN on a.m. labs; encourage p.o. fluids, recheck in 2 days  8/18: Creatinine 1.41 , BUN improved, monitor  8/21: Cr 1.47, BUN up - PO fluids remain minimal - push today  8-23: Creatinine 1.6, will give 1 L IV fluid bolus today.  Repeat in AM.  8-24: Creatinine back to  baseline.  Continue to encourage p.o. intakes.  11.  Anemia: Hgb 11.5 on 08/07--> recheck cbc in am.  Of note, has needed multiple blood transfusions s/p fall May of this year.  - 8-13: 10.5, within limits of variability.  Endorses no bleeding.  Trend, get iron studies, vitamin C, folate, B12 with next labs.   8/14: Fe low IDA despite supplement TID - in setting of CKD - may need venofer infusion if downtrends  8-18: Hemoglobin stable  9.  H/o Gout: continue colchicine .   10. Hypothyroid: continue synthroid  for supplement.   11. Anxiety/alcohol  abuse: continue Klonopin  at bedtime prn. - Provide education - 8-18: Caregiver reports estimate drinking about a bottle of wine per night at home prior to admission.  12. Hx bladder cancer: Treated with TURBT/infusion by Dr. Devere             -see #9             -monitor voiding patterns.             -I/O caths prn - voiding continent  13. Neurogenic bowel/constipation   Senakot changed to 2 Senna HS  Colace decreased to BID  - Last bowel movement 8-16, medium   - 8/19: Multiple BM this AM  - DC at bedtime sennakot, add PRN miralax  17 g daily   - 8/22: Increase to colace 200 mg bid  - 8/23: Add bowel program ordered but was not being done?  DC.  Resume Senokot nightly.  Give sorbitol  today.   8-24: Results with enema this a.m.  Increase nightly Senokot to 2 tabs.  Patient endorses no incontinent episodes, does not feel the urge to defecate but has sensation of defecation and passing gas  when it occurs.  Given no incontinence, will continue to hold off on bowel program.  14.  Orthostatic hypotension.  Likely due to dehydration plus tizanidine  as above.  - Give 1 L IV fluids today  - Repeat labs in a.m.  - DC tizanidine ; switch to dantrolene  for muscle spasms  8-24: Orthostats negative, blood pressure much improved.  LOS: 12 days A FACE TO FACE EVALUATION WAS PERFORMED  Miguel Williams Miguel Williams 12/17/2023, 9:39 PM

## 2023-12-17 NOTE — Progress Notes (Addendum)
+/-   sleep. PRN melatonin given at 2117. Prn lyrica  50mg 's given at 2251. Prn sorbitol  given on previous shift, no results so far. Requests an enema on day shift.  Back Incision OTA, small amount of swelling noted, no redness or drainage. SABRA Elbe, Jorrell Kuster A

## 2023-12-18 DIAGNOSIS — K5900 Constipation, unspecified: Secondary | ICD-10-CM

## 2023-12-18 DIAGNOSIS — N1832 Chronic kidney disease, stage 3b: Secondary | ICD-10-CM | POA: Diagnosis not present

## 2023-12-18 DIAGNOSIS — I10 Essential (primary) hypertension: Secondary | ICD-10-CM | POA: Diagnosis not present

## 2023-12-18 DIAGNOSIS — D649 Anemia, unspecified: Secondary | ICD-10-CM

## 2023-12-18 DIAGNOSIS — M48062 Spinal stenosis, lumbar region with neurogenic claudication: Secondary | ICD-10-CM | POA: Diagnosis not present

## 2023-12-18 LAB — CBC
HCT: 33.1 % — ABNORMAL LOW (ref 39.0–52.0)
Hemoglobin: 11.1 g/dL — ABNORMAL LOW (ref 13.0–17.0)
MCH: 31.4 pg (ref 26.0–34.0)
MCHC: 33.5 g/dL (ref 30.0–36.0)
MCV: 93.5 fL (ref 80.0–100.0)
Platelets: 261 K/uL (ref 150–400)
RBC: 3.54 MIL/uL — ABNORMAL LOW (ref 4.22–5.81)
RDW: 13.4 % (ref 11.5–15.5)
WBC: 5.2 K/uL (ref 4.0–10.5)
nRBC: 0 % (ref 0.0–0.2)

## 2023-12-18 LAB — BASIC METABOLIC PANEL WITH GFR
Anion gap: 7 (ref 5–15)
BUN: 27 mg/dL — ABNORMAL HIGH (ref 8–23)
CO2: 20 mmol/L — ABNORMAL LOW (ref 22–32)
Calcium: 8.7 mg/dL — ABNORMAL LOW (ref 8.9–10.3)
Chloride: 112 mmol/L — ABNORMAL HIGH (ref 98–111)
Creatinine, Ser: 1.37 mg/dL — ABNORMAL HIGH (ref 0.61–1.24)
GFR, Estimated: 53 mL/min — ABNORMAL LOW (ref >60)
Glucose, Bld: 114 mg/dL — ABNORMAL HIGH (ref 70–99)
Potassium: 4.1 mmol/L (ref 3.5–5.1)
Sodium: 139 mmol/L (ref 135–145)

## 2023-12-18 MED ORDER — PREGABALIN 25 MG PO CAPS
25.0000 mg | ORAL_CAPSULE | Freq: Three times a day (TID) | ORAL | Status: DC
  Administered 2023-12-18 – 2023-12-20 (×5): 25 mg via ORAL
  Filled 2023-12-18 (×4): qty 1

## 2023-12-18 NOTE — Progress Notes (Signed)
 Occupational Therapy Session Note  Patient Details  Name: Miguel Williams MRN: [id number] Date of Birth: Oct 09, 1945  Today's Date: 12/18/2023 OT Individual Time: 9065-8954 OT Individual Time Calculation (min): 71 min    Short Term Goals: Week 2:  OT Short Term Goal 1 (Week 2): STGs=LTGs due to patient's estimated length of stay.  Skilled Therapeutic Interventions/Progress Updates:    Pt received supine with 3/10 pain in his legs but no request for intervention, and agreeable to OT session. Discussed DME and d/c planning at length. Skilled monitoring of vitals throughout session to ensure hemodynamic and cardiorespiratory stability. BP supine 145/76 and then 122/79 once seated in the shower. He completed sit > stand from EOB with CGA. Trialed large base quad cane for first few steps but d/t B knee buckling switched to RW. He completed remainder of functional mobility to the bathroom with min A, OT guarding carefully d/t buckling but no LOB. He required min cueing to sequence doffing clothing with maintaining back precautions and bracing. He completed bathing seated on the [facility] at (S) level. He completed transfer back to the w/c, donning back brace first with min A, with min A. He donned new socks with cueing for attempting without sock aid/reacher and he was able to complete with extra time and figure 4 technique. He propelled the w/c to the therapy gym, 100 ft, with cueing for technique- completed to challenge BUE strengthening. Pt completed standing level reciprocal tapping activity with no UE support using a 5 in step 2x15 repetitions. Activity performed to challenge dynamic standing balance and functional activity tolerance to simulate household threshold management and reduce fall risk. Pt required min A overall with rest breaks. He returned to his room and to supine in bed. He was left supine with all needs met, bed alarm set.   Therapy Documentation Precautions:  Precautions Precautions: Back,  Fall Required Braces or Orthoses: Spinal Brace Spinal Brace: Lumbar corset, Applied in sitting position Restrictions Weight Bearing Restrictions Per Provider Order: No  Therapy/Group: Individual Therapy  [name] 12/18/2023, 9:39 AM

## 2023-12-18 NOTE — Progress Notes (Signed)
 Speech Language Pathology Daily Session Note  Patient Details  Name: Trevel Dillenbeck MRN: [id number] Date of Birth: 1945/10/01  Today's Date: 12/18/2023 SLP Individual Time: 0735-0828 SLP Individual Time Calculation (min): 53 min  Short Term Goals: Week 2: SLP Short Term Goal 1 (Week 2): STGs=LTGs d/t ELOS  Skilled Therapeutic Interventions: SLP conducted skilled therapy session targeting cognitive goals. Patient completed warm-up visual and thought organization task where patient was tasked with replicating various objects precisely and in detail. Patient completed task with modI. SLP then facilitated mildly complex thought organization task where he benefited from extra processing time at first but then only supervision assist for thoroughness and accurate completion. For 2 mistakes made, patient self-corrected independently. During scanning and working memory task, patient benefited from supervision assist for working memory. SLP then discussed discharge plans for patient, back precautions, and tasked patient to verbalize changes to daily routines and what these routines might look like when incorporating back precautions. Patient endorses difficulty incorporating back precautions into daily functional tasks, and he benefited from supervision assist to identify modifications to daily routines to assist with carryover upon discharge. Patient was left in room with call bell in reach and alarm set. SLP will continue to target goals per plan of care.      Pain Pain Assessment Pain Scale: 0-10 Pain Score: 8  Pain Location: Foot Pain Intervention(s): Medication (See eMAR)  Therapy/Group: Individual Therapy  Gerardo Territo, M.A., CCC-SLP  Zakkiyya Barno A Geovany Trudo 12/18/2023, 10:58 AM

## 2023-12-18 NOTE — Progress Notes (Signed)
 Physical Therapy Session Note  Patient Details  Name: Miguel Williams MRN: [id number] Date of Birth: 1945/05/17  Today's Date: 12/18/2023 PT Individual Time: 8651-8568 PT Individual Time Calculation (min): 43 min   Short Term Goals: Week 2:  PT Short Term Goal 1 (Week 2): STG = LTG due to ELOS  Skilled Therapeutic Interventions/Progress Updates:    Pt presents in room semi reclined in bed, pt agreeable to PT. Pt reporting pain in back. Session focused on therapeutic activities to facilitate recall of back precautions with mobility as well as bed mobility, transfer training, and tolerance to upright, and gait training with [facility]. Pt demonstrating worsened balance and increased impulsivity this session, medical team notified of difference in performance. Pt completes bed mobility with mod cues for sequencing as pt attempts to complete without log roll initially, supervision. Pt completes donning LSO with mod cues for sequencing. Pt completes sit to stand to RW, CGA/min assist with pt demonstrating posterior LOB. Pt then ambulates with RW to therapy gym ~150' with cues for upright posture, pt demonstrating impulsivity and trying to sit to [facility] prior to [facility] being positioned require mod forceful cues to remain in standing. Pt requires extended seated rest break, then ambulates with RW CGA 175' with pt demonstrating R hip drop in stance, consistent bilateral knee flexion throughout gait cycle. Pt then attempts to stand to [facility], requires min assist with pt requiring x3 attempts with posterior LOB to WC. Pt provided with mod cues to slow down speed of transfer and manage balance prior to attempting ambulation. Pt then ambulates with min assist with NBQC, increased lateral postural sway this session. Pt comes to sitting in [facility] for seated rest break. Pt then completes activity for standing tolerance and dynamic standing balance, hooking horseshoes from RW onto top of standing mirror with mod cues for completing activity  slowly and self correcting balance with pt demonstrating posterior LOB requiring min assist to correct, completes x3 trials as follows: 1 min, 1:48 min, and 1 min. Pt returns to sitting in WC. Pt returns to room and remains seated in [facility] with all needs within reach, cal light in place at end of session.   Therapy Documentation Precautions:  Precautions Precautions: Back, Fall Required Braces or Orthoses: Spinal Brace Spinal Brace: Lumbar corset, Applied in sitting position Restrictions Weight Bearing Restrictions Per Provider Order: No    Therapy/Group: Individual Therapy  Reche Ohara PT, DPT 12/18/2023, 3:25 PM

## 2023-12-18 NOTE — Progress Notes (Addendum)
 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  Tolerating Lyrica .   ROS: Denies fevers, chills, N/V, abdominal pain, constipation, diarrhea, SOB, cough, chest pain, new weakness or paraesthesias.   + Bilateral lower extremity spasms --improving + Bilateral lower extremity burning pain--improved + Lethargy --improved + Orthostasis--improved  Objective:   No results found.   Recent Labs    12/17/23 0511 12/18/23 0510  WBC 5.3 5.2  HGB 11.3* 11.1*  HCT 34.7* 33.1*  PLT 261 261   Recent Labs    12/17/23 0511 12/18/23 0510  NA 141 139  K 5.1 4.1  CL 115* 112*  CO2 20* 20*  GLUCOSE 114* 114*  BUN 31* 27*  CREATININE 1.29* 1.37*  CALCIUM  8.6* 8.7*    Intake/Output Summary (Last 24 hours) at 12/18/2023 1204 Last data filed at 12/18/2023 0700 Gross per 24 hour  Intake 676 ml  Output 900 ml  Net -224 ml        Physical Exam: Vital Signs Blood pressure 120/66, pulse 85, temperature 97.8 F (36.6 C), resp. rate 18, height 5' 8 (1.727 m), weight 88 kg, SpO2 92%.  Constitutional: No apparent distress.  Sitting upright in bed.  Working with therapy HENT: [place], AT Eyes: PERRLA. EOMI. Visual fields grossly intact.  + rolling of right eye during fixation intermittently--significantly less today  Cardiovascular: RRR, no murmurs/rub/gallops. No Edema.  Respiratory: CTAB. No rales, rhonchi, or wheezing. On RA.  Abdomen: + bowel sounds, normoactive. No distention or tenderness.  Skin: C/D/I.   -Surgical site well-approximated -palpable fluctuance at the top of the surgical site and along R lateral margin,   no erythema or drainage--stable from prior exams.  MSK:      No apparent deformity.  Moving all 4 limbs full AROM    Neuro: Alert and oriented x 3. Normal insight and awareness.  +  moderate cognitive deficits.  Moderate memory deficits  Cranial  nerve exam unremarkable.   MMT:  BUE 5/5 prox to distal.  RLE 4+/5 HF, KE and 5/5 ADF/PF.   LLE 4+/5 HF, KE and 5/5 ADF/PF.    Altered sensation to light touch in bilateral lower extremities extending from the feet to the mid shin.   BLE  LE ataxia -improving  DTR's hyperreflexic in both patella, Achilles  Bilateral upper extremity reflexes 2+.  Negative Hoffmann's. + Clonus on ankle jerk R 3-4 beats; 1-2 beats on the left  No apparent tone--consistent with prior exams  Prior neuro assessment is c/w today's exam 12/18/2023.    Assessment/Plan: 1. Functional deficits which require 3+ hours per day of interdisciplinary therapy in a comprehensive inpatient rehab setting. Physiatrist is providing close team supervision and 24 hour management of active medical problems listed below. Physiatrist and rehab team continue to assess barriers to discharge/monitor patient progress toward functional and medical goals  Care Tool:  Bathing    Body parts bathed by patient: Right arm, Left arm, Chest, Abdomen, Front perineal area, Right upper leg, Left upper leg, Face   Body parts bathed by helper: Right lower leg, Left lower leg, Buttocks     Bathing assist Assist Level:  Minimal Assistance - Patient > 75%     Upper Body Dressing/Undressing Upper body dressing   What is the patient wearing?: Pull over shirt, Orthosis    Upper body assist Assist Level: Moderate Assistance - Patient 50 - 74%    Lower Body Dressing/Undressing Lower body dressing      What is the patient wearing?: Underwear/pull up, Pants     Lower body assist Assist for lower body dressing: Moderate Assistance - Patient 50 - 74%     Toileting Toileting    Toileting assist Assist for toileting: Minimal Assistance - Patient > 75%     Transfers Chair/bed transfer  Transfers assist     Chair/bed transfer assist level: Contact Guard/Touching assist     Locomotion Ambulation   Ambulation assist       Assist level: Contact Guard/Touching assist Assistive device: Walker-rolling Max distance: 150   Walk 10 feet activity   Assist     Assist level: Contact Guard/Touching assist Assistive device: Walker-rolling   Walk 50 feet activity   Assist    Assist level: Contact Guard/Touching assist Assistive device: Walker-rolling    Walk 150 feet activity   Assist Walk 150 feet activity did not occur: Safety/medical concerns (fatigue)  Assist level: Contact Guard/Touching assist Assistive device: Walker-rolling    Walk 10 feet on uneven surface  activity   Assist Walk 10 feet on uneven surfaces activity did not occur: Safety/medical concerns         Wheelchair     Assist Is the patient using a wheelchair?: Yes Type of Wheelchair: Manual    Wheelchair assist level: Supervision/Verbal cueing Max wheelchair distance: 150'    Wheelchair 50 feet with 2 turns activity    Assist        Assist Level: Supervision/Verbal cueing   Wheelchair 150 feet activity     Assist      Assist Level: Supervision/Verbal cueing   Blood pressure 120/66, pulse 85, temperature 97.8 F (36.6 C), resp. rate 18, height 5' 8 (1.727 m), weight 88 kg, SpO2 92%.  1. 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              -patient may shower if wound is covered             -ELOS/Goals: 11-13 days, mod I to supervision goals -- 8/27 DC  - Stable to continue inpatient rehab  -Team conference tomorrow    - 8-14: Developing hyperreflexia R lower extremity greater than left lower extremity; generalized fatigue; abnormal eye rolling movements on exam.  EEG today, strength and sensation in the lower extremities is consistent with prior exams so initial hyperreflexia was likely just postop.  Will monitor closely for neurologic changes.   8-15: EEG negative, eye  rolling less apparent today.  Is improving strength in bilateral lower extremities, going from post-op hyporeflexia and hyperreflexic bilaterally.  Therapy performance improving today as compared to yesterday. monitor closely for neurologic changes over the weekend--per therapies, he does a poor job of maintaining spinal precautions in bed.   8/19: Caregiver support ?; uncertain of GF vs. Home nurse (2 weeks on discharge). Cog now improving to where he can repeat back precautions but too impulsive to follow them. CGA with gait 175 ft with RW; self-limiting. SPV to Min A cog; difficulty transitioning from structured to functional tasks.   2.  Antithrombotics: -DVT/anticoagulation:  Mechanical: Sequential compression devices,  below knee Bilateral lower extremities             -antiplatelet therapy: N/A--colchicine  for gout  3. Pain Management: Tylenol  prn mild pain or oxycodone  prn severe pain             -encourage pre-treatment with oxycodone  prior to therapies for better activity tolerance--tolerating today   - 8/13: Hx alcohol  abuse, severe neuropathic pain overlying radicular pain - will get B12, folate, B6 levels with next labs--B12 high, folate/B6 look okay--DC B12   8-14:  switch her over to tizanidine  every 8 hours as needed for spasticity; he also had hallucinations with baclofen in the past--doing better with tizanidine   Kpad ordered   - 8-18: Reduce oxycodone  to 5 mg 3 times daily as needed for severe pain, increase Tylenol  to 500 to 1000 mg 3 times daily as needed for mild to moderate pain    - 8/19: Schedule tylenol ; add dantrolene  25 mg daily for spasms, check LFTs in 1 week prior to increase. -- CMP 8/23 with LFTs normal  - Reports by caregiver patient was taking hydrocodone  at home; will return to home regimen at discharge--discussed this with patient 8-20  - 8-20: Add Lyrica  25 mg daily and 50 mg nightly for severe back pain, bilateral neuropathy  - 8/21: Increase lyirca to 50 mg  TID; add additional 50 mg nightly for burning pain.  Would increase duloxetine  next.  8-22: Too sedated with Lyrica  50/50/100. Increase duloxetine  to 60 mg in the morning, 30 mg at night; reduce standing lyrica  to 25 mg TID and keep 50 mg at bedtime PRN. Labs in am.  8/23: Tolerating current dose of Lyrica  better.  DC tizanidine , with normal LFTs will switch to dantrolene  25 mg 3 times daily today for spasms. 8-24: Per chart, increase Lyrica  to 50 mg 3 times daily.  Will reduce nighttime as needed to 25 mg to reduce likelihood of daytime sedation. -8/25 patient reports neuropathy pain is better controlled, continue current regimen Addendum will decrease lyrica  frequency due to report of worsening balance. Consider [facility] outpatient   4. Mood/Behavior/Sleep: LCSW to follow for evaluation and support.              -antipsychotic agents: N/A  5. Neuropsych/cognition/Hx Wernicke's encephalopathy: This patient is capable of making decisions on his own behalf.   - Reviewed notes from [facility] neurology 10-2023; does not meet criteria for idiopathic Parkinson's at this time, but is undergoing current evaluation.  Has history of Wernicke's encephalopathy and was continued to drink at time of last exam.  - On thiamine , B12 supplement.  - 8/14: EEG for abnormal eye rolling movements on exam, fatigue; negative.  Talking with staff, was present on admission but is not as noticeable. 8-15: EEG negative.  Neuropsych noted some attention and memory deficits, but not profound.    - 8/19: Anticipate 100% supervision at home; discussed with the patient's caregiver, will get MRI given concerns for hitting his head during prior falls and possible overlying TBI--negative for acute changes, likely overlying concussion. Discussed results with patient and likely concussion 8-20 Will f/u with Neuro Dr. Evonnie as OP   -discussed eye rolling behavior with Dr. Michaela, likely secondary to [facility] and exacerbated by  concussion.  No further workup warranted.  6. Skin/Wound Care: Monitor incision for healing.  -dressing changes as needed. -Routine pressure relief measures.  -8/14: Appears with adhesive allergy on back; dressings removed. surgical site appears stable. - 8-15: Some fluctuance on the top and  bottom of surgical site - nontender, no drainage, notably improving neurologic exam. Monitor for now.  8-18: Significant increase in fluctuance from last week, informed neurosurgery PA due to concern for reaccumulating seroma; continuing to make therapy progress-- Appreciate Suzen Pean NP coming in, no concerning changes with wound -has had some very mild increase in fluctuance along the right lateral margin but overall remains unchanged  7. Fluids/Electrolytes/Nutrition: Monitor I/O. Check CMET in am.    - Ca Below  8.  HTN: Monitor BP TID--on Amlodipine  and Avapro .   - 8/25 well-controlled continue current regimen    12/18/2023    4:51 AM 12/17/2023    8:08 PM 12/17/2023    4:48 AM  Vitals with BMI  Systolic 120 136 876  Diastolic 66 69 58  Pulse 85 84 69    9.  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 on 08/07-->recheck labs in am  - 8-13: Stable creatinine, increased BUN on a.m. labs; encourage p.o. fluids, recheck in 2 days  8/18: Creatinine 1.41 , BUN improved, monitor  8/21: Cr 1.47, BUN up - PO fluids remain minimal - push today  8-23: Creatinine 1.6, will give 1 L IV fluid bolus today.  Repeat in AM.  8-24: Creatinine back to baseline.  Continue to encourage p.o. intakes.  8/25 creatinine/BUN stable at 1.37/27.  Continue to encourage p.o. fluid intake  11.  Anemia: Hgb 11.5 on 08/07--> recheck cbc in am.  Of note, has needed multiple blood transfusions s/p fall May of this year.  - 8-13: 10.5, within limits of variability.  Endorses no bleeding.  Trend, get iron studies, vitamin C, folate, B12 with next labs.    8/14: Fe low IDA despite supplement TID - in setting of CKD - may need venofer infusion if downtrends  8-25 hemoglobin stable 11.1  9.  H/o Gout: continue colchicine .   10. Hypothyroid: continue synthroid  for supplement.   11. Anxiety/alcohol  abuse: continue Klonopin  at bedtime prn. - Provide education - 8-18: Caregiver reports estimate drinking about a bottle of wine per night at home prior to admission.  12. Hx bladder cancer: Treated with TURBT/infusion by Dr. Devere             -see #9             -monitor voiding patterns.             -I/O caths prn - voiding continent  13. Neurogenic bowel/constipation   Senakot changed to 2 Senna HS  Colace decreased to BID  - Last bowel movement 8-16, medium   - 8/19: Multiple BM this AM  - DC at bedtime sennakot, add PRN miralax  17 g daily   - 8/22: Increase to colace 200 mg bid  - 8/23: Add bowel program ordered but was not being done?  DC.  Resume Senokot nightly.  Give sorbitol  today.   8-24: Results with enema this a.m.  Increase nightly Senokot to 2 tabs.  Patient endorses no incontinent episodes, does not feel the urge to defecate but has sensation of defecation and passing gas when it occurs.  Given no incontinence, will continue to hold off on bowel program.  8/25 LBM yesterday continue to monitor  14.  Orthostatic hypotension.  Likely due to dehydration plus tizanidine  as above.  - Give 1 L IV fluids today  - Repeat labs in a.m.  - DC tizanidine ; switch to dantrolene  for muscle  spasms  8-24: Orthostats negative, blood pressure much improved.  LOS: 13 days A FACE TO FACE EVALUATION WAS PERFORMED  Murray Collier 12/18/2023, 12:04 PM

## 2023-12-18 NOTE — Progress Notes (Signed)
 Slept good. PRN melatonin given at 2120. Large BM after enema given on previous shift. Ortho vitals collected last night. Wore SCD's all night. Rondle Lohse A

## 2023-12-18 NOTE — Progress Notes (Addendum)
 Occupational Therapy Session Note  Patient Details  Name: Miguel Williams MRN: [id number] Date of Birth: 1945/09/22  Today's Date: 12/18/2023 OT Individual Time: 0259-0329 OT Individual Time Calculation (min): 30 min    Short Term Goals: Week 2:  OT Short Term Goal 1 (Week 2): STGs=LTGs due to patient's estimated length of stay.  Skilled Therapeutic Interventions/Progress Updates:  Skilled OT session completed to address balance when performing functional mobility. Pt received sitting in WC, agreeable to participate in therapy. Pt reports no pain.  Pt completed WC>EOM CGA with RW. Throughout session VC given for safety when sitting d/t impulsive sitting during tasks. Pt performed weight shifting activity 3x in standing with RW CGA placing one LE on a numbered dot at a time. Sitting intermittently throughout task for rest breaks. Pt displayed fair balance during activity noticing LOB when shifting weight on RLE. Provided VC to increase BOS when weight shifting to reduce risk for falls. Pt performed stepping over 1 inch step 2x with RW requiring CGA for RW placement. Graded task down to marching in place 3x15 d/t pt reporting LOB when stepping forward with RW.   Ambulated to room CGA with caregiver following behind in [facility]. Returned EOB requiring VC to follow back precations when returning to supine. Pt requested SCD placed on BLE at the end of session. Bed alarm on and all needs met with caregiver present.  Therapy Documentation Precautions:  Precautions Precautions: Back, Fall Required Braces or Orthoses: Spinal Brace Spinal Brace: Lumbar corset, Applied in sitting position Restrictions Weight Bearing Restrictions Per Provider Order: No    Therapy/Group: Individual Therapy  Franz Svec Woods-Chance, MS, OTR/L 12/18/2023, 4:32 PM

## 2023-12-19 ENCOUNTER — Other Ambulatory Visit (HOSPITAL_COMMUNITY): Payer: Self-pay

## 2023-12-19 DIAGNOSIS — M48062 Spinal stenosis, lumbar region with neurogenic claudication: Secondary | ICD-10-CM | POA: Diagnosis not present

## 2023-12-19 MED ORDER — DANTROLENE SODIUM 25 MG PO CAPS
25.0000 mg | ORAL_CAPSULE | Freq: Three times a day (TID) | ORAL | 0 refills | Status: DC
  Filled 2023-12-19: qty 90, 30d supply, fill #0

## 2023-12-19 MED ORDER — DULOXETINE HCL 60 MG PO CPEP
60.0000 mg | ORAL_CAPSULE | Freq: Two times a day (BID) | ORAL | 0 refills | Status: DC
  Filled 2023-12-19: qty 60, 30d supply, fill #0

## 2023-12-19 MED ORDER — DULOXETINE HCL 60 MG PO CPEP
60.0000 mg | ORAL_CAPSULE | Freq: Two times a day (BID) | ORAL | Status: DC
  Administered 2023-12-19 – 2023-12-20 (×2): 60 mg via ORAL
  Filled 2023-12-19 (×2): qty 1

## 2023-12-19 MED ORDER — MELATONIN 5 MG PO TABS
5.0000 mg | ORAL_TABLET | Freq: Every evening | ORAL | Status: AC | PRN
Start: 2023-12-19 — End: ?

## 2023-12-19 MED ORDER — SENNA 8.6 MG PO TABS
2.0000 | ORAL_TABLET | Freq: Every day | ORAL | 0 refills | Status: DC
  Filled 2023-12-19: qty 120, 60d supply, fill #0

## 2023-12-19 MED ORDER — POLYVINYL ALCOHOL 1.4 % OP SOLN: 1.0000 [drp] | Freq: Four times a day (QID) | OPHTHALMIC | Status: AC | PRN

## 2023-12-19 MED ORDER — SORBITOL 70 % SOLN
60.0000 mL | Freq: Every day | 0 refills | Status: DC | PRN
  Filled 2023-12-19: qty 473, 7d supply, fill #0

## 2023-12-19 MED ORDER — PREGABALIN 25 MG PO CAPS
25.0000 mg | ORAL_CAPSULE | Freq: Three times a day (TID) | ORAL | 0 refills | Status: DC
  Filled 2023-12-19: qty 90, 30d supply, fill #0

## 2023-12-19 MED ORDER — CETAPHIL MOISTURIZING EX LOTN: TOPICAL_LOTION | CUTANEOUS | Status: AC | PRN

## 2023-12-19 MED ORDER — DOCUSATE SODIUM 100 MG PO CAPS
200.0000 mg | ORAL_CAPSULE | Freq: Two times a day (BID) | ORAL | 0 refills | Status: DC
  Filled 2023-12-19: qty 60, 15d supply, fill #0

## 2023-12-19 MED ORDER — POLYETHYLENE GLYCOL 3350 17 G PO PACK
17.0000 g | PACK | Freq: Every day | ORAL | Status: DC
  Administered 2023-12-19 – 2023-12-20 (×2): 17 g via ORAL
  Filled 2023-12-19 (×2): qty 1

## 2023-12-19 MED ORDER — ACETAMINOPHEN 500 MG PO TABS
1000.0000 mg | ORAL_TABLET | Freq: Three times a day (TID) | ORAL | Status: AC
Start: 2023-12-19 — End: ?

## 2023-12-19 MED ORDER — POLYETHYLENE GLYCOL 3350 17 GM/SCOOP PO POWD
17.0000 g | Freq: Every day | ORAL | 0 refills | Status: DC
  Filled 2023-12-19: qty 238, 14d supply, fill #0

## 2023-12-19 NOTE — Plan of Care (Signed)
  Problem: RH Ambulation Goal: LTG Patient will ambulate in controlled environment (PT) Description: LTG: Patient will ambulate in a controlled environment, # of feet with assistance (PT). Outcome: Adequate for Discharge Goal: LTG Patient will ambulate in home environment (PT) Description: LTG: Patient will ambulate in home environment, # of feet with assistance (PT). Outcome: Adequate for Discharge   Problem: RH Balance Goal: LTG Patient will maintain dynamic standing balance (PT) Description: LTG:  Patient will maintain dynamic standing balance with assistance during mobility activities (PT) Outcome: Completed/Met   Problem: Sit to Stand Goal: LTG:  Patient will perform sit to stand with assistance level (PT) Description: LTG:  Patient will perform sit to stand with assistance level (PT) Outcome: Completed/Met   Problem: RH Bed Mobility Goal: LTG Patient will perform bed mobility with assist (PT) Description: LTG: Patient will perform bed mobility with assistance, with/without cues (PT). Outcome: Completed/Met   Problem: RH Bed to Chair Transfers Goal: LTG Patient will perform bed/chair transfers w/assist (PT) Description: LTG: Patient will perform bed to chair transfers with assistance (PT). Outcome: Completed/Met   Problem: RH Car Transfers Goal: LTG Patient will perform car transfers with assist (PT) Description: LTG: Patient will perform car transfers with assistance (PT). Outcome: Completed/Met   Problem: RH Stairs Goal: LTG Patient will ambulate up and down stairs w/assist (PT) Description: LTG: Patient will ambulate up and down # of stairs with assistance (PT) Outcome: Completed/Met

## 2023-12-19 NOTE — Plan of Care (Signed)
  Problem: RH Balance Goal: LTG Patient will maintain dynamic standing with ADLs (OT) Description: LTG:  Patient will maintain dynamic standing balance with assist during activities of daily living (OT)  Outcome: Not Met (add Reason) Flowsheets (Taken 12/19/2023 1519) LTG: Pt will maintain dynamic standing balance during ADLs with: (lack of progress) Contact Guard/Touching assist   Problem: Sit to Stand Goal: LTG:  Patient will perform sit to stand in prep for activites of daily living with assistance level (OT) Description: LTG:  Patient will perform sit to stand in prep for activites of daily living with assistance level (OT) Outcome: Completed/Met Flowsheets (Taken 12/11/2023 0740 by Milford Nereida HERO, OT) LTG: PT will perform sit to stand in prep for activites of daily living with assistance level: Supervision/Verbal cueing   Problem: RH Bathing Goal: LTG Patient will bathe all body parts with assist levels (OT) Description: LTG: Patient will bathe all body parts with assist levels (OT) Outcome: Completed/Met Flowsheets (Taken 12/11/2023 0740 by Milford Nereida HERO, OT) LTG: Pt will perform bathing with assistance level/cueing: Supervision/Verbal cueing   Problem: RH Dressing Goal: LTG Patient will perform upper body dressing (OT) Description: LTG Patient will perform upper body dressing with assist, with/without cues (OT). Outcome: Completed/Met Flowsheets (Taken 12/11/2023 0740 by Milford Nereida HERO, OT) LTG: Pt will perform upper body dressing with assistance level of: Supervision/Verbal cueing Goal: LTG Patient will perform lower body dressing w/assist (OT) Description: LTG: Patient will perform lower body dressing with assist, with/without cues in positioning using equipment (OT) Outcome: Completed/Met Flowsheets (Taken 12/11/2023 0740 by Milford Nereida HERO, OT) LTG: Pt will perform lower body dressing with assistance level of: Supervision/Verbal cueing   Problem:  RH Toileting Goal: LTG Patient will perform toileting task (3/3 steps) with assistance level (OT) Description: LTG: Patient will perform toileting task (3/3 steps) with assistance level (OT)  Outcome: Completed/Met Flowsheets (Taken 12/11/2023 0740 by Milford Nereida HERO, OT) LTG: Pt will perform toileting task (3/3 steps) with assistance level: Supervision/Verbal cueing   Problem: RH Toilet Transfers Goal: LTG Patient will perform toilet transfers w/assist (OT) Description: LTG: Patient will perform toilet transfers with assist, with/without cues using equipment (OT) Outcome: Completed/Met Flowsheets (Taken 12/11/2023 0740 by Milford Nereida HERO, OT) LTG: Pt will perform toilet transfers with assistance level of: Supervision/Verbal cueing   Problem: RH Tub/Shower Transfers Goal: LTG Patient will perform tub/shower transfers w/assist (OT) Description: LTG: Patient will perform tub/shower transfers with assist, with/without cues using equipment (OT) Outcome: Completed/Met Flowsheets (Taken 12/11/2023 0740 by Milford Nereida HERO, OT) LTG: Pt will perform tub/shower stall transfers with assistance level of: Supervision/Verbal cueing

## 2023-12-19 NOTE — Progress Notes (Signed)
 Speech Language Pathology Discharge Summary  Patient Details  Name: Miguel Williams MRN: [id number] Date of Birth: [birth year]  Date of Discharge from SLP service:December 19, 2023  Today's Date: 12/19/2023 SLP Individual Time: 8665-8586 SLP Individual Time Calculation (min): 39 min  Skilled Therapeutic Interventions:  SLP conducted skilled therapy session targeting cognitive goals. SLP challenged dual tasking through requesting patient complete mildly complex memory tasks while standing. Compared to when sitting, patient exhibited significantly increased deficits, suspect due to increased cognitive challenge of splitting focus between balancing and task at hand. Patient also appeared disgruntled when asked to stand, thus suspect patient was rushing through task in order to be able to sit. At beginning of session, patient sat quickly and impulsively but with supervision cues, patient incorporated more gentle and slow sitting motion for remainder of session. When standing, patient required min to mod assist for attention to detail and memory accuracy. Discussed with patient the importance of supervision during all movement tasks and discussed modifications to be made to cognitive tasks to accommodate for improved accuracy when sitting. Patient verbalized understanding. Patient was left in room with call bell in reach and alarm set. Patient is appropriate for discharge. See full summary below.  Patient has met 3 of 3 long term goals.  Patient to discharge at overall Supervision level.  Reasons goals not met: n/a   Clinical Impression/Discharge Summary:   Patient has made excellent progress throughout therapy stay and has met 3/3 speech therapy goals set for duration of admission. Patient requires supervision for all mildly complex cognitive tasks, especially when dual-tasking. Patient verbalizes understanding of increased cognitive deficits when standing/balancing. He also independently verbalizes importance  of adhering to back precautions. Patient would benefit from ongoing SLP services to target lingering deficits. Patient and family education complete. SLP will sign off.   Care Partner:  Caregiver Able to Provide Assistance: Yes  Type of Caregiver Assistance: Cognitive  Recommendation:  Outpatient SLP  Rationale for SLP Follow Up: Maximize cognitive function and independence   Equipment: n/a   Reasons for discharge: Discharged from hospital   Patient/Family Agrees with Progress Made and Goals Achieved: Yes   Tel Hevia, M.A., CCC-SLP  Idara Woodside A Finlee Concepcion 12/19/2023, 3:25 PM

## 2023-12-19 NOTE — Discharge Instructions (Addendum)
 Inpatient Rehab Discharge Instructions  Miguel Williams Discharge date and time:  12/20/23  Activities/Precautions/ Functional Status: Activity: no lifting, driving, or strenuous exercise till cleared by Dr. [name].  Diet: cardiac diet Wound Care: keep wound clean and dry   Functional status:  ___ No restrictions     ___ Walk up steps independently _X__ 24/7 supervision/assistance   ___ Walk up steps with assistance ___ Intermittent supervision/assistance  ___ Bathe/dress independently ___Walk with walker     ___ Bathe/dress with assistance ___ Walk Independently    ___ Shower independently _X__ Walk with assistance    ___ Shower with assistance _X__ No alcohol      ___ Return to work/school ________   Special Instructions:  COMMUNITY REFERRALS UPON DISCHARGE:    Home Health:   PT      OT      ST   SNA                      Agency:[facility]      Phone:[phone]  *Please expect follow-up within 2-3 business days for discharge to schedule your home visit. If you have not received follow-up, be sure to contact the site directly.*    Medical Equipment/Items Ordered:3in1 bdeside commode                                                 Agency/Supplier:[facility] [phone]    My questions have been answered and I understand these instructions. I will adhere to these goals and the provided educational materials after my discharge from the hospital.  Patient/Caregiver Signature _______________________________ Date __________  Clinician Signature _______________________________________ Date __________  Please bring this form and your medication list with you to all your follow-up doctor's appointments.

## 2023-12-19 NOTE — Progress Notes (Signed)
 Physical Therapy Session Note  Patient Details  Name: Miguel Williams MRN: [id number] Date of Birth: 1946/01/27  Today's Date: 12/19/2023 PT Individual Time: 8583-8554 PT Individual Time Calculation (min): 29 min   Short Term Goals: Week 2:  PT Short Term Goal 1 (Week 2): STG = LTG due to ELOS  Skilled Therapeutic Interventions/Progress Updates:     Pt received semi reclined in bed and agrees to therapy. No complaint of pain. Pt performs bed mobility with cues for positioning at EOB. Pt dons LSO with increased time required and setup assistance. Pt stands with cues for initiation then ambulates x150' to gym with RW and cues for posture and decreased WB through RW for energy conservation and improved body mechanics. PT provides pt with HEP for balance and lower extremity strengthening and pt completes each exercise with PT providing verbal and tactile cues for NM feedback and correct performance. Pt completes x5 reps sit to stand, x10 minisquats with BUE support, x20 alternating high knee marches, x10 heel raises, and 3x10 standing hamstring curls. Pt ambulates back to room with RW. Left supine with all needs within reach.   Therapy Documentation Precautions:  Precautions Precautions: Back, Fall Recall of Precautions/Restrictions: Impaired Precaution/Restrictions Comments: able to recall 3/3 back precautions Required Braces or Orthoses: Spinal Brace Spinal Brace: Lumbar corset, Applied in sitting position Restrictions Weight Bearing Restrictions Per Provider Order: No    Therapy/Group: Individual Therapy  Elsie JAYSON Dawn, PT, DPT 12/19/2023, 4:41 PM

## 2023-12-19 NOTE — Progress Notes (Signed)
 Patient ID: Miguel Williams, male   DOB: 1946/03/07, 78 y.o.   MRN: [id number]  SW ordered 3in1 BSC with [employer] via parachute.   SW met with pt in room to provide updates from team conference, and inform on above DME ordered. SW reviewed discharge. No questions/concerns reported.   1550- SW left message for [name] informing on above and encouraged follow-up if needed.   Graeme Jude, MSW, LCSW Office: [phone] Cell: [phone] Fax: [phone]

## 2023-12-19 NOTE — Progress Notes (Signed)
 Physical Therapy Discharge Summary  Patient Details  Name: Miguel Williams MRN: [id number] Date of Birth: 1945-10-22  Date of Discharge from PT service:December 19, 2023  Today's Date: 12/19/2023 PT Individual Upfz:[id number] PT Individual Time Calculation: 55 min   Patient has met 6 of 8 long term goals due to improved activity tolerance, improved balance, improved postural control, increased strength, and improved coordination.  Patient to discharge at an ambulatory level CGA.   Patient's care partner is independent to provide the necessary physical and cognitive assistance at discharge.  Reasons goals not met: Pt continues to require intermittent CGA  Recommendation:  Patient will benefit from ongoing skilled PT services in home health setting to continue to advance safe functional mobility, address ongoing impairments in dynamic standing balance, standing tolerance, gait mechanics, and minimize fall risk.  Equipment: No equipment provided  Reasons for discharge: treatment goals met and discharge from hospital  Patient/family agrees with progress made and goals achieved: Yes  PT Discharge Skilled Treatment Interventions: Pt presents in room in bed, agreeable to PT. Pt reporting BLE pain this morning however reports that SCDs have been helping. Session focused on therapeutic activity for DC planning as well as tolerance to upright as well as bed mobility and transfer training. Pt continues to demonstrate mod/max cues for following spinal precuations with all mobility. Pt completes bed mobility with supervision with mod cues for log roll. Pt completes transfers with RW with supervision with cues for anterior wt shift, requires mod/max cues for turning completely and using UE to reach back for chair prior to sit. Pt ambulates 270' and 300' with RW with supervision occasional CGA due to impulsivity. Pt completes car transfer with CGA and mod/max cues for sequencing. Pt ambulates up/down ramp  with CGA with cues for speed and safety. Pt completes picking up object off floor with CGA and reacher. Pt returns to room and returns to supine at end of session with supervision, remains semi reclined with all needs within reach, call light in place, bed alarm activated at end of session.  Precautions/Restrictions Precautions Precautions: Back;Fall Recall of Precautions/Restrictions: Intact Precaution/Restrictions Comments: able to recall 3/3 back precautions Required Braces or Orthoses: Spinal Brace Spinal Brace: Lumbar corset;Applied in sitting position Restrictions Weight Bearing Restrictions Per Provider Order: No Pain Interference Pain Interference Pain Effect on Sleep: 2. Occasionally Pain Interference with Therapy Activities: 3. Frequently Pain Interference with Day-to-Day Activities: 1. Rarely or not at all Vision/Perception  Vision - History Ability to See in Adequate Light: 0 Adequate Perception Perception: Within Functional Limits Praxis Praxis: WFL  Cognition Overall Cognitive Status: Within Functional Limits for tasks assessed Arousal/Alertness: Awake/alert Orientation Level: Oriented X4 Year: 2025 Month: August Day of Week: Correct Attention: Sustained;Selective Sustained Attention: Appears intact Selective Attention: Appears intact Memory: Appears intact Memory Impairment: Storage deficit;Retrieval deficit;Decreased recall of new information;Decreased short term memory Decreased Short Term Memory: Verbal basic;Functional basic Awareness: Appears intact Awareness Impairment: Intellectual impairment;Emergent impairment Problem Solving: Impaired Problem Solving Impairment: Functional complex Executive Function: Self Correcting;Self Monitoring Self Monitoring: Impaired Self Monitoring Impairment: Verbal complex;Functional complex Self Correcting: Impaired Self Correcting Impairment: Verbal complex;Functional complex Safety/Judgment: Impaired Comments:  requires occasional VC for back precautions Sensation Sensation Light Touch: Impaired by gross assessment Hot/Cold: Appears Intact Proprioception: Appears Intact Stereognosis: Appears Intact Coordination Gross Motor Movements are Fluid and Coordinated: No Fine Motor Movements are Fluid and Coordinated: No Coordination and Movement Description: 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  Motor Motor: Abnormal postural alignment and  control Motor - Skilled Clinical Observations: Generalized deconditioning although better from eval Motor - Discharge Observations: Generalized deconditioning although better from eval  Mobility Bed Mobility Bed Mobility: Rolling Right;Rolling Left;Sit to Supine;Supine to Sit Rolling Right: Supervision/verbal cueing Rolling Left: Supervision/Verbal cueing Supine to Sit: Supervision/Verbal cueing Sit to Supine: Supervision/Verbal cueing Transfers Transfers: Sit to Stand;Stand to Sit;Stand Pivot Transfers Sit to Stand: Supervision/Verbal cueing Stand to Sit: Supervision/Verbal cueing Stand Pivot Transfers: Supervision/Verbal cueing Stand Pivot Transfer Details: Verbal cues for technique;Verbal cues for precautions/safety;Verbal cues for safe use of DME/AE Transfer (Assistive device): Rolling walker Locomotion  Gait Ambulation: Yes Gait Assistance: Contact Guard/Touching assist Gait Distance (Feet): 300 Feet Assistive device: Rolling walker Gait Assistance Details: Verbal cues for sequencing;Verbal cues for precautions/safety;Verbal cues for safe use of DME/AE;Verbal cues for gait pattern;Tactile cues for initiation;Tactile cues for posture Gait Gait: Yes Gait Pattern: Impaired Gait Pattern: Step-through pattern;Trunk flexed;Right flexed knee in stance;Left flexed knee in stance;Decreased stride length;Decreased step length - left;Decreased step length - right Gait velocity: variable Stairs / Additional  Locomotion Stairs: Yes Stairs Assistance: Contact Guard/Touching assist Stair Management Technique: Two rails;Alternating pattern;Forwards Number of Stairs: 12 Height of Stairs: 6 Ramp: Contact Guard/touching assist Wheelchair Mobility Wheelchair Mobility: No  Trunk/Postural Assessment  Cervical Assessment Cervical Assessment: Within Functional Limits Thoracic Assessment Thoracic Assessment: Exceptions to [facility] (back precautions) Lumbar Assessment Lumbar Assessment: Exceptions to [facility] (back precautions) Postural Control Postural Control: Deficits on evaluation Righting Reactions: delayed and inadequate Protective Responses: decreased Postural Limitations: decreased  Balance Balance Balance Assessed: Yes Standardized Balance Assessment Standardized Balance Assessment: Timed Up and Go Test Timed Up and Go Test TUG: Normal TUG Normal TUG (seconds): 27.93 (with RW) Static Sitting Balance Static Sitting - Balance Support: Feet supported;No upper extremity supported Static Sitting - Level of Assistance: 5: Stand by assistance Dynamic Sitting Balance Dynamic Sitting - Balance Support: Feet supported;No upper extremity supported Dynamic Sitting - Level of Assistance: 5: Stand by assistance Static Standing Balance Static Standing - Balance Support: Bilateral upper extremity supported;During functional activity Static Standing - Level of Assistance: Other (comment) (CGA) Dynamic Standing Balance Dynamic Standing - Balance Support: Bilateral upper extremity supported;During functional activity Dynamic Standing - Level of Assistance: Other (comment) (CGA) Extremity Assessment  RUE Assessment RUE Assessment: Within Functional Limits LUE Assessment LUE Assessment: Within Functional Limits RLE Assessment RLE Assessment: Exceptions to [facility] General Strength Comments: Grossly 4/5, demonstrating poor eccentric control and limited muscular endurance LLE Assessment LLE Assessment: Exceptions  to [facility] General Strength Comments: Grossly 4+/5, demonstrating poor eccentric control and limited muscular endurance   Reche Ohara PT, DPT 12/19/2023, 3:59 PM

## 2023-12-19 NOTE — Plan of Care (Signed)
?  Problem: RH Problem Solving Goal: LTG Patient will demonstrate problem solving for (SLP) Description: LTG:  Patient will demonstrate problem solving for basic/complex daily situations with cues  (SLP) Outcome: Completed/Met   Problem: RH Memory Goal: LTG Patient will use memory compensatory aids to (SLP) Description: LTG:  Patient will use memory compensatory aids to recall biographical/new, daily complex information with cues (SLP) Outcome: Completed/Met   Problem: RH Awareness Goal: LTG: Patient will demonstrate awareness during functional activites type of (SLP) Description: LTG: Patient will demonstrate awareness during functional activites type of (SLP) Outcome: Completed/Met   

## 2023-12-19 NOTE — Progress Notes (Signed)
 Inpatient Rehabilitation Care Coordinator Discharge Note   Patient Details  Name: Miguel Williams MRN: [id number] Date of Birth: 05-17-1945   Discharge location: D/c to home  Length of Stay: 15 days  Discharge activity level: Supervision  Home/community participation: Limiteed  Patient response un:Yzjouy Literacy - How often do you need to have someone help you when you read instructions, pamphlets, or other written material from your doctor or pharmacy?: Rarely  Patient response un:Dnrpjo Isolation - How often do you feel lonely or isolated from those around you?: Never  Services provided included: MD, RD, PT, OT, SLP, RN, CM, TR, Pharmacy, Neuropsych, SW  Financial Services:  Field seismologist Utilized: Media planner [employer]  Choices offered to/list presented to: patient  Follow-up services arranged:  DME, Patient/Family request agency HH/DME, Home Health Home Health Agency: [facility]/OT/SLP/aide    DME : [facility] for 3in1 [facility] HH/DME Requested Agency: [facility]  Patient response to transportation need: Is the patient able to respond to transportation needs?: Yes In the past 12 months, has lack of transportation kept you from medical appointments or from getting medications?: No In the past 12 months, has lack of transportation kept you from meetings, work, or from getting things needed for daily living?: No   Patient/Family verbalized understanding of follow-up arrangements:  Yes  Individual responsible for coordination of the follow-up plan: contact pt  Confirmed correct DME delivered: Graeme DELENA Jude 12/19/2023    Comments (or additional information):fam edu completed  Summary of Stay    Date/Time Discharge Planning CSW  12/19/23 1055 Plan is for pt to d/c to home with support from friend until about the first week of September and then will have intermittent support. Pt reports he has other friends that can assit with his care needs. Fam  edu completed. [facility] for HHPT/OT/SLP/aide. SW will confirm there are no barriers to disharge,. AAC  12/11/23 1127 Plan is for pt to d/c to home with support from friend until about the first week of September and then will have intermittent support. Pt reports he has other friends that can assit with his care needs. SW will confirm there are no barriers to disharge,. AAC       Auria A Jude

## 2023-12-19 NOTE — Progress Notes (Signed)
 Occupational Therapy Discharge Summary  Patient Details  Name: Miguel Williams MRN: [id number] Date of Birth: Nov 14, 1945  Date of Discharge from OT service:December 19, 2023  Today's Date: 12/19/2023 OT Individual Time: 1035-1130 OT Individual Time Calculation (min): 55 min    Patient has met 7 of 8 long term goals due to improved activity tolerance, improved balance, postural control, ability to compensate for deficits, and improved awareness.  Patient to discharge at overall Supervision level.  Patient's care partner is independent to provide the necessary physical assistance at discharge.    Reasons goals not met: d/t decreased balance   Recommendation:  Patient will benefit from ongoing skilled OT services in home health setting to continue to advance functional skills in the area of BADL, iADL, and Reduce care partner burden.  Equipment: BSC  Reasons for discharge: treatment goals met and discharge from hospital  Patient/family agrees with progress made and goals achieved: Yes  OT Discharge Precautions/Restrictions  Precautions Precautions: Back;Fall Recall of Precautions/Restrictions: Intact Precaution/Restrictions Comments: able to recall 3/3 back precautions Required Braces or Orthoses: Spinal Brace Spinal Brace: Lumbar corset;Applied in sitting position Restrictions Weight Bearing Restrictions Per Provider Order: No General Chart Reviewed: Yes Family/Caregiver Present: No Pain Pain Assessment Pain Scale: 0-10 Pain Score: 6  Pain Type: Neuropathic pain Pain Location: Leg Pain Orientation: Right;Left Pain Descriptors / Indicators: Aching Pain Onset: On-going ADL ADL Equipment Provided: Reacher, Long-handled sponge Eating: Independent Grooming: Modified independent Where Assessed-Grooming: Sitting at sink Upper Body Bathing: Supervision/safety Where Assessed-Upper Body Bathing: Shower Lower Body Bathing: Supervision/safety Where Assessed-Lower Body Bathing:  Shower Upper Body Dressing: Supervision/safety Where Assessed-Upper Body Dressing: Edge of bed Lower Body Dressing: Supervision/safety Where Assessed-Lower Body Dressing: Sitting at sink, Standing at sink Toileting: Supervision/safety Where Assessed-Toileting: Teacher, adult education: Close supervision Toilet Transfer Method: Insurance claims handler: Close supervison Film/video editor: Close supervision Vision Baseline Vision/History: 0 No visual deficits;4 Cataracts Patient Visual Report: No change from baseline Vision Assessment?: No apparent visual deficits Perception  Perception: Within Functional Limits Praxis Praxis: WFL Cognition Cognition Overall Cognitive Status: Within Functional Limits for tasks assessed Arousal/Alertness: Awake/alert Orientation Level: Person;Place;Situation Person: Oriented Place: Oriented Situation: Oriented Memory: Appears intact Memory Impairment: Storage deficit;Retrieval deficit;Decreased recall of new information;Decreased short term memory Decreased Short Term Memory: Verbal basic;Functional basic Attention: Sustained;Selective Sustained Attention: Appears intact Selective Attention: Appears intact Awareness: Appears intact Awareness Impairment: Intellectual impairment;Emergent impairment Problem Solving: Impaired Problem Solving Impairment: Functional complex Executive Function: Self Correcting;Self Monitoring Self Monitoring: Impaired Self Monitoring Impairment: Verbal complex;Functional complex Self Correcting: Impaired Self Correcting Impairment: Verbal complex;Functional complex Safety/Judgment: Impaired Comments: requires occasional VC for back precautions Brief Interview for Mental Status (BIMS) Repetition of Three Words (First Attempt): 3 Temporal Orientation: Year: Correct Temporal Orientation: Month: Accurate within 5 days Temporal Orientation: Day: Correct Recall: Sock: Yes, no cue required Recall: Blue:  Yes, no cue required Recall: Bed: Yes, no cue required BIMS Summary Score: 15 Sensation Sensation Light Touch: Impaired by gross assessment Hot/Cold: Appears Intact Proprioception: Appears Intact Stereognosis: Appears Intact Coordination Gross Motor Movements are Fluid and Coordinated: No Fine Motor Movements are Fluid and Coordinated: No Coordination and Movement Description: 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  Motor Motor: Abnormal postural alignment and control Motor - Skilled Clinical Observations: Generalized deconditioning although better from eval Mobility  Bed Mobility Bed Mobility: Rolling Right;Rolling Left;Sit to Supine;Supine to Sit Rolling Right: Supervision/verbal cueing Rolling Left: Supervision/Verbal cueing Supine to Sit: Supervision/Verbal cueing Sit to Supine: Supervision/Verbal cueing Transfers  Sit to Stand: Supervision/Verbal cueing Stand to Sit: Supervision/Verbal cueing  Trunk/Postural Assessment  Cervical Assessment Cervical Assessment: Within Functional Limits Thoracic Assessment Thoracic Assessment: Exceptions to [facility] (back precautions) Lumbar Assessment Lumbar Assessment: Exceptions to [facility] (back precautions) Postural Control Postural Control: Deficits on evaluation Righting Reactions: delayed and inadequate Protective Responses: decreased Postural Limitations: decreased  Balance Balance Balance Assessed: Yes Static Sitting Balance Static Sitting - Balance Support: Feet supported;No upper extremity supported Static Sitting - Level of Assistance: 6: Modified independent (Device/Increase time) Dynamic Sitting Balance Dynamic Sitting - Balance Support: Feet supported;No upper extremity supported Dynamic Sitting - Level of Assistance: 5: Stand by assistance Static Standing Balance Static Standing - Balance Support: Bilateral upper extremity supported;During functional activity Static Standing - Level of  Assistance: 5: Stand by assistance Dynamic Standing Balance Dynamic Standing - Balance Support: Bilateral upper extremity supported;During functional activity Dynamic Standing - Level of Assistance: 4: Min assist (CGA) Extremity/Trunk Assessment RUE Assessment RUE Assessment: Within Functional Limits LUE Assessment LUE Assessment: Within Functional Limits  Tx session General: Pt supine in bed upon OT arrival, agreeable to OT session.  Pain: see above   ADL: OT providing skilled intervention with ADL retraining with levels of assist listed above. Pt able to recall 3/3 back precautions, although cueing required to follow. Pt able to do/doff brace when appropriate seated EOB before mobility. Education provided on increased safety with ADLs such as completing dressing while sitting to avoid LOB.    Pt supine in bed with bed alarm activated, 2 bed rails up, call light within reach and 4Ps assessed.  Camie Hoe, OTD, OTR/L 12/19/2023, 3:33 PM

## 2023-12-19 NOTE — Patient Care Conference (Signed)
 Inpatient RehabilitationTeam Conference and Plan of Care Update Date: 12/19/2023   Time: 1048 am    Patient Name: Miguel Williams      Medical Record Number: [id number]  Date of Birth: Sep 23, 1945 Sex: Male         Room/Bed: 4W01C/4W01C-01 Payor Info: Payor: [employer]vertising copywriter MEDICARE / Plan: [employer] MEDICARE / Product Type: *No Product type* /    Admit Date/Time:  12/05/2023  6:24 PM  Primary Diagnosis:  Neurogenic claudication due to lumbar spinal stenosis  Hospital Problems: Principal Problem:   Neurogenic claudication due to lumbar spinal stenosis Active Problems:   Lumbar polyradiculopathy   Depression with anxiety    Expected Discharge Date: Expected Discharge Date: 12/20/23  Team Members Present: Physician leading conference: Dr. Joesph Likes Social Worker Present: Graeme Jude, LCSW Nurse Present: Eulalio Falls, RN PT Present: Elam Ohara, PT OT Present: Nena Moats, OT SLP Present: Recardo Mole, SLP PPS Coordinator present : Eleanor Colon, SLP     Current Status/Progress Goal Weekly Team Focus  Bowel/Bladder   Continent of bowel and bladder; last BM 12/17/23   Pt will remain continent of bowel and bladder   Patient will remain continent of bowel and bladder    Swallow/Nutrition/ Hydration               ADL's   Poor precaution adherence and safety awareness, (S) bathing seated on the BSC, min A transfers with the RW d/t B knee buckling   Supervision   Transfers, ADLs, standing balance, d/c planning    Mobility   supervision bed mobility, CGA transfers, CGA gait 270' with RW   supervision ambulatory  barriers: cog deficits (limited carryover), decreased insight into deficits, impulsivity    Communication                Safety/Cognition/ Behavioral Observations     ***          Pain   Patient reports back/leg pain. Recieves oxycodone  PRN per order   Patient will have minimal to no pain; patient pain will improve   Patient will have minimal  to no pain    Skin   Patient surgical site CDI, will remain free from infection   Patients surgical site to remain free from infection  Patient surgical site to remain free from infection and no further skin breakdown      Discharge Planning:  Plan is for pt to d/c to home with support from friend until about the first week of September and then will have intermittent support. Pt reports he has other friends that can assit with his care needs. Fam edu completed. [facility] for HHPT/OT/SLP/aide. SW will confirm there are no barriers to disharge,.    Team Discussion: Patient was admitted post decompression and fusion due to lumbar myelopathy. Patient with pain/spasms: medications adjusted by MD. Patient limited by cognitive deficits, limited carryover , decreased insight to deficits , impulsivity, poor precaution adherence and safety awareness.   Patient on target to meet rehab goals: yes, patient needs supervision assistance with ADLs. Patient needs CGA with transfers and able to ambulate up to 270' with CGA using a RW. Overall goals at discharge are set for supervision assistance.    *See Care Plan and progress notes for long and short-term goals.   Revisions to Treatment Plan:  SCD's   Teaching Needs: Safety, medications, transfers, toileting , etc   Current Barriers to Discharge: Decreased caregiver support and Home enviroment access/layout  Possible Resolutions to Barriers: Family  education Home health follow up DME: R/W, BSC,W/C     Medical Summary Current Status: Medically complicated by lumbar myelopathy with postop course complicated by seroma status post washout, cognitive deficit, bilateral lower extremity neuropathic pain, prerenal AKI on CKD, and anemia, neurogenic bowel  Barriers to Discharge: Behavior/Mood;Complicated Wound;Self-care education;Spasticity;Uncontrolled Pain;Medical stability;Neurogenic Bowel & Bladder   Possible Resolutions to [employer]  Focus: Continue monitoring wound, titrate pain medications for neuropathic pain, encourage PO intakes and monitor renal function, cognitive encouragement, alcohol  cessation education, titrate bowel medicaitons   Continued Need for Acute Rehabilitation Level of Care: The patient requires daily medical management by a physician with specialized training in physical medicine and rehabilitation for the following reasons: Direction of a multidisciplinary physical rehabilitation program to maximize functional independence : Yes Medical management of patient stability for increased activity during participation in an intensive rehabilitation regime.: Yes Analysis of laboratory values and/or radiology reports with any subsequent need for medication adjustment and/or medical intervention. : Yes   I attest that I was present, lead the team conference, and concur with the assessment and plan of the team.   Miguel Williams 12/19/2023, 1048 am

## 2023-12-19 NOTE — Progress Notes (Signed)
 PROGRESS NOTE   Subjective/Complaints: No events overnight. Vitals stable .    12/19/2023    4:58 AM 12/18/2023    7:40 PM 12/18/2023    3:50 PM  Vitals with BMI  Systolic 114 142 873  Diastolic 63 82 72  Pulse 79 91 86    No results for input(s): GLUCAP in the last 72 hours.   P.o. intakes appropriate  Continent of bladder  Last BM 8/24, large - with enema.   Patient did very well with therapies this a.m., walked 200 to 300 feet, doing much better about discharge. Continuing to have aching pain in bilateral feet.   ROS: Denies fevers, chills, N/V, abdominal pain, constipation, diarrhea, SOB, cough, chest pain, new weakness or paraesthesias.   + Bilateral lower extremity spasms --essentially resolved + Bilateral lower extremity burning pain--improved, but ongoing + Lethargy --improved + Orthostasis--improved  Objective:   No results found.   Recent Labs    12/17/23 0511 12/18/23 0510  WBC 5.3 5.2  HGB 11.3* 11.1*  HCT 34.7* 33.1*  PLT 261 261   Recent Labs    12/17/23 0511 12/18/23 0510  NA 141 139  K 5.1 4.1  CL 115* 112*  CO2 20* 20*  GLUCOSE 114* 114*  BUN 31* 27*  CREATININE 1.29* 1.37*  CALCIUM  8.6* 8.7*    Intake/Output Summary (Last 24 hours) at 12/19/2023 0946 Last data filed at 12/19/2023 0724 Gross per 24 hour  Intake 536 ml  Output 1425 ml  Net -889 ml        Physical Exam: Vital Signs Blood pressure 114/63, pulse 79, temperature 97.8 F (36.6 C), temperature source Oral, resp. rate 16, height 5' 8 (1.727 m), weight 88 kg, SpO2 95%.  Constitutional: No apparent distress.  Sitting upright in bed. HENT: [place], AT Eyes: PERRLA. EOMI. Visual fields grossly intact.  + rolling of right eye during fixation intermittently-increased today.  Cardiovascular: RRR, no murmurs/rub/gallops. No Edema.  Respiratory: CTAB. No rales, rhonchi, or wheezing. On RA.  Abdomen: + bowel sounds,  normoactive. No distention or tenderness.  Skin: C/D/I.   -Surgical site well-approximated -palpable fluctuance at the top of the surgical site and along R lateral margin,   no erythema or drainage--reduced from prior exams  MSK:      No apparent deformity.  Moving all 4 limbs full AROM    Neuro: Alert and oriented x 3. Normal insight and awareness.  +  moderate cognitive deficits.  Moderate memory deficits  Cranial nerve exam unremarkable.   MMT:  BUE 5/5 prox to distal.  RLE 4+/5 HF, KE and 5/5 ADF/PF.   LLE 5-/5 HF, KE and 5/5 ADF/PF.    Altered sensation to light touch in bilateral lower extremities extending from the feet to the mid shin.   BLE  LE ataxia -improving  DTR's hyperreflexic L>R patella  Bilateral upper extremity reflexes 2+.  Negative Hoffmann's. + Clonus on ankle jerk  3-4 beats on the left ; none on the right No apparent tone    Assessment/Plan: 1. Functional deficits which require 3+ hours per day of interdisciplinary therapy in a comprehensive inpatient rehab setting. Physiatrist is providing close team supervision  and 24 hour management of active medical problems listed below. Physiatrist and rehab team continue to assess barriers to discharge/monitor patient progress toward functional and medical goals  Care Tool:  Bathing    Body parts bathed by patient: Right arm, Left arm, Chest, Abdomen, Front perineal area, Right upper leg, Left upper leg, Face   Body parts bathed by helper: Right lower leg, Left lower leg, Buttocks     Bathing assist Assist Level: Minimal Assistance - Patient > 75%     Upper Body Dressing/Undressing Upper body dressing   What is the patient wearing?: Pull over shirt, Orthosis    Upper body assist Assist Level: Moderate Assistance - Patient 50 - 74%    Lower Body Dressing/Undressing Lower body dressing      What is the patient wearing?: Underwear/pull up, Pants     Lower body assist Assist for lower body  dressing: Moderate Assistance - Patient 50 - 74%     Toileting Toileting    Toileting assist Assist for toileting: Minimal Assistance - Patient > 75%     Transfers Chair/bed transfer  Transfers assist     Chair/bed transfer assist level: Contact Guard/Touching assist     Locomotion Ambulation   Ambulation assist      Assist level: Contact Guard/Touching assist Assistive device: Walker-rolling Max distance: 150   Walk 10 feet activity   Assist     Assist level: Contact Guard/Touching assist Assistive device: Walker-rolling   Walk 50 feet activity   Assist    Assist level: Contact Guard/Touching assist Assistive device: Walker-rolling    Walk 150 feet activity   Assist Walk 150 feet activity did not occur: Safety/medical concerns (fatigue)  Assist level: Contact Guard/Touching assist Assistive device: Walker-rolling    Walk 10 feet on uneven surface  activity   Assist Walk 10 feet on uneven surfaces activity did not occur: Safety/medical concerns   Assist level: Contact Guard/Touching assist Assistive device: Walker-rolling   Wheelchair     Assist Is the patient using a wheelchair?: No (ambulates on unit, uses WC as transport) Type of Wheelchair: Manual    Wheelchair assist level: Supervision/Verbal cueing Max wheelchair distance: 150'    Wheelchair 50 feet with 2 turns activity    Assist        Assist Level: Supervision/Verbal cueing   Wheelchair 150 feet activity     Assist      Assist Level: Supervision/Verbal cueing   Blood pressure 114/63, pulse 79, temperature 97.8 F (36.6 C), temperature source Oral, resp. rate 16, height 5' 8 (1.727 m), weight 88 kg, SpO2 95%.  1. 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              -patient may shower if wound is covered             -ELOS/Goals: 11-13  days, mod I to supervision goals -- 8/27 DC  - Stable to continue inpatient rehab  -Team conference tomorrow    - 8-14: Developing hyperreflexia R lower extremity greater than left lower extremity; generalized fatigue; abnormal eye rolling movements on exam.  EEG today, strength and sensation in the lower extremities is consistent with prior exams so initial hyperreflexia was likely just postop.  Will monitor closely for neurologic changes.   8-15: EEG negative, eye rolling less apparent today.  Is improving strength in bilateral lower extremities, going from post-op hyporeflexia and hyperreflexic bilaterally.  Therapy performance improving today as compared to yesterday. monitor closely for neurologic changes over the weekend--per therapies, he does a poor job of maintaining spinal precautions in bed.   8/19: Caregiver support ?; uncertain of GF vs. Home nurse (2 weeks on discharge). Cog now improving to where he can repeat back precautions but too impulsive to follow them. CGA with gait 175 ft with RW; self-limiting. SPV to Min A cog; difficulty transitioning from structured to functional tasks.    8/25: Had a bad day with therapies yesterday, better today. Feel like he has plateaued. Spasms have been well controlled. Poor sdafety awareness and precaution adherence. Was knee buckling a lot yesterday - CGA to SPV today. Consistent cognitive issues. CGA mostly for transfers and gait - walked up to 300 ft today with RW. Team does not think he would improve with further IRF. Divided attention and awareness deficits - breaks down during tasks.   2.  Antithrombotics: -DVT/anticoagulation:  Mechanical: Sequential compression devices, below knee Bilateral lower extremities             -antiplatelet therapy: N/A--colchicine  for gout  3. Pain Management: Tylenol  prn mild pain or oxycodone  prn severe pain             -encourage pre-treatment with oxycodone  prior to therapies for better activity  tolerance--tolerating today   - 8/13: Hx alcohol  abuse, severe neuropathic pain overlying radicular pain - will get B12, folate, B6 levels with next labs--B12 high, folate/B6 look okay--DC B12   8-14:  switch her over to tizanidine  every 8 hours as needed for spasticity; he also had hallucinations with baclofen in the past--doing better with tizanidine   Kpad ordered   - 8-18: Reduce oxycodone  to 5 mg 3 times daily as needed for severe pain, increase Tylenol  to 500 to 1000 mg 3 times daily as needed for mild to moderate pain    - 8/19: Schedule tylenol ; add dantrolene  25 mg daily for spasms, check LFTs in 1 week prior to increase. -- CMP 8/23 with LFTs normal  - Reports by caregiver patient was taking hydrocodone  at home; will return to home regimen at discharge--discussed this with patient 8-20  - 8-20: Add Lyrica  25 mg daily and 50 mg nightly for severe back pain, bilateral neuropathy  - 8/21: Increase lyirca to 50 mg TID; add additional 50 mg nightly for burning pain.  Would increase duloxetine  next.  8-22: Too sedated with Lyrica  50/50/100. Increase duloxetine  to 60 mg in the morning, 30 mg at night; reduce standing lyrica  to 25 mg TID and keep 50 mg at bedtime PRN. Labs in am.  8/23: Tolerating current dose of Lyrica  better.  DC tizanidine , with normal LFTs will switch to dantrolene  25 mg 3 times daily today for spasms. 8-24: Per chart, increase Lyrica  to 50 mg 3 times daily.  Will reduce nighttime as needed to 25 mg to reduce likelihood of daytime sedation. -8/25 patient reports neuropathy pain is better controlled, continue current regimen Addendum will decrease lyrica  frequency due to report of worsening balance. Consider Qutenza outpatient   8-26: Patient doing much better today with reduce Lyrica , still having pain in his feet but discussed that this is going to be a balance of his cognition.  Increase Cymbalta  to 60 mg twice daily, told him this will take several weeks to appreciate  effects.  Hopeful to wean off Lyrica  as outpatient, trial of Qutenza.  4. Mood/Behavior/Sleep: LCSW to follow for evaluation and support.              -  antipsychotic agents: N/A  5. Neuropsych/cognition/Hx Wernicke's encephalopathy: This patient is capable of making decisions on his own behalf.   - Reviewed notes from [facility] neurology 10-2023; does not meet criteria for idiopathic Parkinson's at this time, but is undergoing current evaluation.  Has history of Wernicke's encephalopathy and was continued to drink at time of last exam.  - On thiamine , B12 supplement.  - 8/14: EEG for abnormal eye rolling movements on exam, fatigue; negative.  Talking with staff, was present on admission but is not as noticeable. 8-15: EEG negative.  Neuropsych noted some attention and memory deficits, but not profound.    - 8/19: Anticipate 100% supervision at home; discussed with the patient's caregiver, will get MRI given concerns for hitting his head during prior falls and possible overlying TBI--negative for acute changes, likely overlying concussion. Discussed results with patient and likely concussion 8-20 Will f/u with Neuro Dr. Evonnie as OP   -discussed eye rolling behavior with Dr. Michaela, likely secondary to [facility] and exacerbated by concussion.  No further workup warranted.  6. Skin/Wound Care: Monitor incision for healing.  -dressing changes as needed. -Routine pressure relief measures.  -8/14: Appears with adhesive allergy on back; dressings removed. surgical site appears stable. - 8-15: Some fluctuance on the top and bottom of surgical site - nontender, no drainage, notably improving neurologic exam. Monitor for now.  8-18: Significant increase in fluctuance from last week, informed neurosurgery PA due to concern for reaccumulating seroma; continuing to make therapy progress-- Appreciate Suzen Pean NP coming in, no concerning changes with wound -has had some very mild increase in fluctuance  along the right lateral margin but overall remains unchanged 8-26: Fluctuance reducing, wound site appears stable.  7. Fluids/Electrolytes/Nutrition: Monitor I/O. Check CMET in am.    - Ca Below  8.  HTN: Monitor BP TID--on Amlodipine  and Avapro .   - 8/25 well-controlled continue current regimen    12/19/2023    4:58 AM 12/18/2023    7:40 PM 12/18/2023    3:50 PM  Vitals with BMI  Systolic 114 142 873  Diastolic 63 82 72  Pulse 79 91 86    9.  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 on 08/07-->recheck labs in am  - 8-13: Stable creatinine, increased BUN on a.m. labs; encourage p.o. fluids, recheck in 2 days  8/18: Creatinine 1.41 , BUN improved, monitor  8/21: Cr 1.47, BUN up - PO fluids remain minimal - push today  8-23: Creatinine 1.6, will give 1 L IV fluid bolus today.  Repeat in AM.  8-24: Creatinine back to baseline.  Continue to encourage p.o. intakes.  8/25 creatinine/BUN stable at 1.37/27.  Continue to encourage p.o. fluid intake   - DC IV  11.  Anemia: Hgb 11.5 on 08/07--> recheck cbc in am.  Of note, has needed multiple blood transfusions s/p fall May of this year.  - 8-13: 10.5, within limits of variability.  Endorses no bleeding.  Trend, get iron studies, vitamin C, folate, B12 with next labs.   8/14: Fe low IDA despite supplement TID - in setting of CKD - may need venofer infusion if downtrends  8-25 hemoglobin stable 11.1  9.  H/o Gout: continue colchicine .   10. Hypothyroid: continue synthroid  for supplement.   11. Anxiety/alcohol  abuse: continue Klonopin  at bedtime prn. - Provide education - 8-18: Caregiver reports estimate drinking about a bottle of wine per night at home prior  to admission.  12. Hx bladder cancer: Treated with TURBT/infusion by Dr. Devere             -see #9             -monitor voiding patterns.             -I/O caths prn - voiding continent  13. Neurogenic  bowel/constipation   Senakot changed to 2 Senna HS  Colace decreased to BID  - Last bowel movement 8-16, medium   - 8/19: Multiple BM this AM  - DC at bedtime sennakot, add PRN miralax  17 g daily   - 8/22: Increase to colace 200 mg bid  - 8/23: Add bowel program ordered but was not being done?  DC.  Resume Senokot nightly.  Give sorbitol  today.   8-24: Results with enema this a.m.  Increase nightly Senokot to 2 tabs.  Patient endorses no incontinent episodes, does not feel the urge to defecate but has sensation of defecation and passing gas when it occurs.  Given no incontinence, will continue to hold off on bowel program.   8/26 - add miralax  daily - would send home with PRN sorbitol  to use if no bowel movements within 3 to 4 days.  Did talk about possibility of bowel program as outpatient and use of suppositories, however patient feels that he is doing well on his current regimen and would prefer to stay oral if possible.  14.  Orthostatic hypotension.  Likely due to dehydration plus tizanidine  as above.  - Give 1 L IV fluids today  - Repeat labs in a.m.  - DC tizanidine ; switch to dantrolene  for muscle spasms  8-24: Orthostats negative, blood pressure much improved.  LOS: 14 days A FACE TO FACE EVALUATION WAS PERFORMED  Miguel Williams Likes 12/19/2023, 9:46 AM

## 2023-12-20 ENCOUNTER — Other Ambulatory Visit (HOSPITAL_COMMUNITY): Payer: Self-pay

## 2023-12-20 DIAGNOSIS — G629 Polyneuropathy, unspecified: Secondary | ICD-10-CM

## 2023-12-20 DIAGNOSIS — M48062 Spinal stenosis, lumbar region with neurogenic claudication: Secondary | ICD-10-CM | POA: Diagnosis not present

## 2023-12-20 DIAGNOSIS — K5900 Constipation, unspecified: Secondary | ICD-10-CM | POA: Insufficient documentation

## 2023-12-20 DIAGNOSIS — M549 Dorsalgia, unspecified: Secondary | ICD-10-CM | POA: Insufficient documentation

## 2023-12-20 NOTE — Progress Notes (Signed)
 Inpatient Rehabilitation Discharge Medication Review by a Pharmacist  A complete drug regimen review was completed for this patient to identify any potential clinically significant medication issues.  High Risk Drug Classes Is patient taking? Indication by Medication  Antipsychotic No   Anticoagulant No   Antibiotic No   Opioid Yes Norco, tramadol  - pain  Loperamide - constipation   Antiplatelet No   Hypoglycemics/insulin No   Vasoactive Medication Yes Valsartan - HTN Flomax  - BPH  Chemotherapy No   Other Yes Dantrolene - muscle spasms  Docusate, senokot, sorbitol , PEG- constipation  Melatonin- sleep  Pregabalin - neuropathy  Duloxetine - mood  APAP- pain  Atorvastatin , Zetia  - HLD Dutasteride - BPH Ferrous sulfate , cyanocobalamin , thiamine , Vitamin D- supplement Colchicine  - gout Lansoprazole- reflux  Levthyroxine -hypothyrodism Clonazepam  - sleep/anxiety   Acetaminophen - pain    Cyclobenzaprine  - muscle spasms Cymbalta - depression Docusate, sorbitol  -bowel regimen, constipation    Melatonin - sleep Protonix  - GERD Thiamine , Vitamin B12 - supplements  Type of Medication Issue Identified Description of Issue Recommendation(s)  Drug Interaction(s) (clinically significant)     Duplicate Therapy     Allergy     No Medication Administration End Date     Incorrect Dose     Additional Drug Therapy Needed     Significant med changes from prior encounter (inform family/care partners about these prior to discharge). PTA medications:  Valsartan - substituting Irbesartan  Prevacid = substituting Protonix  Tramadol  was discontinued on [facility] acute encounter/discharge. May switch back to Valsartan and prevacid on discharge from [facility].   Communicate to patient /family/ caregiver prior to discharge.   Other       Clinically significant medication issues were identified that warrant physician communication and completion of prescribed/recommended actions by midnight of the next  day:  No  Name of provider notified for urgent issues identified:   Provider Method of Notification:     Pharmacist comments:   Time spent performing this drug regimen review (minutes):  25   Massie Fila, PharmD Clinical Pharmacist  12/20/2023 7:17 AM

## 2023-12-20 NOTE — Discharge Summary (Signed)
 Physician Discharge Summary  Patient ID: Miguel Williams MRN: [id number] DOB/AGE: 79/10/1945 77 y.o.  Admit date: 12/05/2023 Discharge date: 12/20/2023  Discharge Diagnoses:  Principal Problem:   Neurogenic claudication due to lumbar spinal stenosis Active Problems:   INSOMNIA, CHRONIC, MILD   GASTROESOPHAGEAL REFLUX DISEASE, CHRONIC   Renal failure (ARF), acute on chronic (HCC)   Hypertension   Lumbar polyradiculopathy   Stage 3a chronic kidney disease (HCC)   Anemia, blood loss   Depression with anxiety   Constipation   Acute on chronic back pain   Neuropathy   Discharged Condition: stable  Significant Diagnostic Studies: MR BRAIN WO CONTRAST Result Date: 12/12/2023 EXAM: MRI BRAIN WITHOUT CONTRAST 12/12/2023 04:22:23 PM TECHNIQUE: Multiplanar multisequence MRI of the head/brain was performed without the administration of intravenous contrast. COMPARISON: MRI head 08/10/2023 CLINICAL HISTORY: Mental status change, unknown cause. FINDINGS: BRAIN AND VENTRICLES: T2/FLAIR hyperintensity in the periventricular and subcortical white matter compatible with mild for age chronic microvascular ischemic changes. Similar prominence of the ventricles likely related to parenchymal volume loss. No acute infarct. No intracranial hemorrhage. No mass. No midline shift. No hydrocephalus. The sella is unremarkable. Normal flow voids. ORBITS: Bilateral lens replacement. No acute abnormality. SINUSES AND MASTOIDS: Mild mucosal thickening in the right maxillary sinus. No acute abnormality. BONES AND SOFT TISSUES: Normal marrow signal. No acute soft tissue abnormality. IMPRESSION: 1. No acute intracranial abnormality. 2. Mild for age chronic microvascular ischemic changes. Electronically signed by: Donnice Mania MD 12/12/2023 05:23 PM EDT RP Workstation: [id number]   EEG adult Result Date: 12/07/2023 Shelton Arlin KIDD, MD     12/07/2023  3:02 PM Patient Name: Miguel Williams MRN: [id number] Epilepsy Attending:  Arlin KIDD Shelton Referring Physician/Provider: Emeline Joesph BROCKS, DO Date: 12/07/2023 Duration: 24.50 mins Patient history: 78yo M with fatigue and repeated eye rolling throughout exam - patient not aware of events. EEG to evaluate for seizure Level of alertness: Awake AEDs during EEG study: None Technical aspects: This EEG study was done with scalp electrodes positioned according to the 10-20 International system of electrode placement. Electrical activity was reviewed with band pass filter of 1-70Hz , sensitivity of 7 uV/mm, display speed of 20mm/sec with a 60Hz  notched filter applied as appropriate. EEG data were recorded continuously and digitally stored.  Video monitoring was available and reviewed as appropriate. Description: The posterior dominant rhythm consists of 8 Hz activity of moderate voltage (25-35 uV) seen predominantly in posterior head regions, symmetric and reactive to eye opening and eye closing. Photic driving ws not seen during photic stimulation. Hyperventilation and photic stimulation were not performed.   IMPRESSION: This study is within normal limits. No seizures or epileptiform discharges were seen throughout the recording. A normal interictal EEG does not exclude the diagnosis of epilepsy. Arlin KIDD Shelton     Labs:  Basic Metabolic Panel: Recent Labs  Lab 12/14/23 0522 12/16/23 1039 12/17/23 0511 12/18/23 0510  NA 140 137 141 139  K 4.2 4.3 5.1 4.1  CL 106 107 115* 112*  CO2 21* 21* 20* 20*  GLUCOSE 110* 122* 114* 114*  BUN 25* 33* 31* 27*  CREATININE 1.47* 1.61* 1.29* 1.37*  CALCIUM  8.7* 8.8* 8.6* 8.7*    CBC: Recent Labs  Lab 12/14/23 0522 12/17/23 0511 12/18/23 0510  WBC 5.9 5.3 5.2  NEUTROABS  --  2.9  --   HGB 11.2* 11.3* 11.1*  HCT 33.9* 34.7* 33.1*  MCV 93.6 94.0 93.5  PLT 280 261 261    CBG: No results for input(s): GLUCAP  in the last 168 hours.  Brief HPI:   Miguel Williams is a 78 y.o. male with history of bladder cancer, CKD 3, idiopathic  aseptic necrosis of bilateral femurs, EtOH abuse, anxiety disorder, renal calculi, lumbar stenosis with severe bilateral hip and leg pain with neurogenic claudication who underwent decompression with interbody fusion L4/5 and L5/S1 on 723/25.  He was readmitted on 0 11/30/23 with reports of frequent falls with BLE weakness and progressive back and leg pain.  MRI spine done revealing large 6.8 cm posterior paraspinal fluid collection extending into dorsal canal with anterior displacement of cauda equina nerve roots and severe L4/5 and moderate L5/S1 canal stenosis.  He underwent exploration of lumbar wound with evacuation of old blood with seroma on 08/08 by Dr. [name].    Postop, continue to be limited by right greater than left lower extremity weakness with right knee instability, dizziness, unsteady gait as well as cognitive deficits with poor carryover requiring multimodal cues for safety.  Therapy was consulted and he was requiring min to min plus to assist with mobility and mod assist with ADLs.  He was independent prior to recent surgery.  CIR was recommended due to functional decline.   Hospital Course: Miguel Williams was admitted to rehab 12/05/2023 for inpatient therapies to consist of PT, ST and OT at least three hours five days a week. Past admission physiatrist, therapy team and rehab RN have worked together to provide customized collaborative inpatient rehab.  Blood pressures were monitored on TID basis and has been controlled without medications.  He has had issues with severe neuropathic pain and folic acid /B6 levels were noted to be WNL.  Tizanidine  was switched to dantrolene  with good results.  LFTs were repeated a week after starting this and was WNL. Lyrica  was added for neuropathic pain but he was not able to tolerated higher doses due to SE and this was adjusted to 25 mg TID which he has been tolerating without difficulty.  Cymbalta  has been titrated up to 60 mg BID by discharge without SE.     Back incision was noted to have some fluctuance at proximal and distal aspects but was non tender and has been C/D/I and healing without signs of infection. Follow up check of lytes showed acute on chronic renal failure which is improvement with encouragement to increase fluids. CBC showed ABLA to be slowly improving on iron supplement.    Hospital course significant for issues with cognition a well as reports of eye rolling as well as post op hyporeflexia and hyperreflexia bilaterally.  EEG done for work up and was negative. MRI brain was negative for acute abnormality. Symptoms have improved and felt to be due to concussion.  Mentation has improved and oxycodone  was weaned down to 5 mg prn with recommendations to resume hydrocodone  after discharge. Constipation has resolved with adjustment of bowel regimen. He has made gains during his stay and is at supervision level at discharge.  He will continue to receive follow HHPT, HHOT, HHST and aide by [facility] after discharge.   Rehab course: During patient's stay in rehab weekly team conferences were held to monitor patient's progress, set goals and discuss barriers to discharge. At admission, patient required min assist with mobility and mod assist with ADL tasks.  He exhibited moderate cognitive deficits with delayed recall and decreased awareness of deficits.  He  has had improvement in activity tolerance, balance, postural control as well as ability to compensate for deficits. He has had improvement  in functional use RLE and LLE as well as improvement in awareness. He is able to complete ADL tasks with supervision.  He requires supervision for transfers and CGA to ambulate 300' with use of RW. He is able to climb 12 stairs with CGA. He requires supervision with mildly complex tasks. Education has been completed with caregiver.     Discharge disposition: 06-Home-Health Care Svc  Diet: Regular.   Special Instructions: No driving or  strenuous activity. Back precautions. Recheck CBC in 1-2 weeks.   Allergies as of 12/20/2023       Reactions   Baclofen Other (See Comments)   Hallucinations- required admit to hospital for adverse drug rxn.   Bacitracin-polymyxin B Hives   Tegretol  [carbamazepine ] Hives   Allopurinol Other (See Comments)   Had a bad reaction   Neomycin Other (See Comments)   Allergic skin reaction   Sulfa Antibiotics Hives, Rash        Medication List     STOP taking these medications    tiZANidine  2 MG tablet Commonly known as: ZANAFLEX        TAKE these medications    acetaminophen  500 MG tablet Commonly known as: TYLENOL  Take 2 tablets (1,000 mg total) by mouth in the morning, at noon, and at bedtime. What changed:  how much to take when to take this reasons to take this   artificial tears ophthalmic solution Place 1 drop into both eyes 4 (four) times daily as needed for dry eyes.   atorvastatin  40 MG tablet Commonly known as: LIPITOR Take 40 mg by mouth in the morning.   betamethasone dipropionate 0.05 % cream Apply 1 Application topically 2 (two) times daily as needed (skin irritation.).   calcium -vitamin D 500-5 MG-MCG tablet Commonly known as: OSCAL WITH D Take 1 tablet by mouth.   cetaphil lotion Apply topically as needed for dry skin.   clonazePAM  0.5 MG tablet Commonly known as: KLONOPIN  Take 0.5 mg by mouth at bedtime as needed (sleep/anxiety).   colchicine  0.6 MG tablet Take 0.6 mg by mouth in the morning.   cyanocobalamin  500 MCG tablet Commonly known as: VITAMIN B12 Take 500 mcg by mouth daily.   dantrolene  25 MG capsule Commonly known as: DANTRIUM  Take 1 capsule (25 mg total) by mouth 3 (three) times daily.   docusate sodium  100 MG capsule Commonly known as: COLACE Take 2 capsules (200 mg total) by mouth 2 (two) times daily.   DULoxetine  60 MG capsule Commonly known as: CYMBALTA  Take 1 capsule (60 mg total) by mouth 2 (two) times  daily. What changed: when to take this   dutasteride  0.5 MG capsule Commonly known as: Avodart  Take 1 capsule (0.5 mg total) by mouth every other day. What changed: when to take this Notes to patient: Every other day in evenings with supper. Next dose will be on Thursday evening on 08/89   ezetimibe  10 MG tablet Commonly known as: ZETIA  Take 10 mg by mouth in the morning.   ferrous sulfate  325 (65 FE) MG EC tablet Take 325 mg by mouth 3 (three) times daily with meals.   FIBER CHOICE PO Take 2 capsules by mouth daily.   Geri-kot 8.6 MG tablet Generic drug: senna Take 2 tablets (17.2 mg total) by mouth at bedtime.   HYDROcodone -acetaminophen  5-325 MG tablet Commonly known as: NORCO/VICODIN Take 2 tablets by mouth every 4 (four) hours as needed for severe pain (pain score 7-10).   lansoprazole 30 MG capsule Commonly known as: PREVACID Take  30 mg by mouth in the morning and at bedtime.   levothyroxine  50 MCG tablet Commonly known as: SYNTHROID  Take 50-100 mcg by mouth See admin instructions. Take 2 tablets (100 mcg) by mouth in the morning on Sundays.  Take 1 tablet (50 mcg) by mouth in the morning on Mondays, Tuesdays, Wednesdays, Thursdays, Fridays & Saturdays.   loperamide  2 MG tablet Commonly known as: IMODIUM  A-D Take 2 mg by mouth 4 (four) times daily as needed for diarrhea or loose stools.   melatonin 5 MG Tabs Take 1 tablet (5 mg total) by mouth at bedtime as needed.   polyethylene glycol powder 17 GM/SCOOP powder Commonly known as: GLYCOLAX /MIRALAX  Mix 1 capful (17 g) with water  and drink by mouth daily.   pregabalin  25 MG capsule Commonly known as: LYRICA  Take 1 capsule (25 mg total) by mouth 3 (three) times daily.   sorbitol  70 % Soln Take 60 mLs by mouth daily as needed for moderate constipation. Notes to patient: Use if no BM in 2-3 days   Systane Complete PF 0.6 % Soln Generic drug: Propylene Glycol (PF) Place 1 drop into both eyes 4 (four) times  daily as needed (for dryness).   tamsulosin  0.4 MG Caps capsule Commonly known as: FLOMAX  TAKE 1 CAPSULE (0.4 MG TOTAL) BY MOUTH DAILY.   thiamine  100 MG tablet Commonly known as: Vitamin B-1 Take 1 tablet (100 mg total) by mouth daily.   traMADol  50 MG tablet Commonly known as: ULTRAM  Take 50 mg by mouth every 6 (six) hours as needed (pain). Notes to patient: Resume for moderate pain   valsartan 80 MG tablet Commonly known as: DIOVAN Take 80 mg by mouth at bedtime.   VITAMIN D (CHOLECALCIFEROL ) PO Take 1 capsule by mouth daily.        Follow-up Information     Shayne Anes, MD Follow up.   Specialty: Internal Medicine Why: Call in 1-2 days for post hospital follow up Contact information: [address] [place] [address] [phone]         Emeline Search C, DO Follow up.   Specialty: Physical Medicine and Rehabilitation Why: office will call you with follow up appointment Contact information: [address] [place] [address] [phone]         Tat, Asberry RAMAN, DO. Call.   Specialty: Neurology Why: for follow up Contact information: [address] [place]  [address] [place] [address] [phone]         [name], MD Follow up.   Specialty: Neurosurgery Why: Call in 1-2 days for post hospital follow up Contact information: [address] [place] [address] [phone]                 Signed: Sharlet RAMAN Schmitz 12/20/2023, 5:40 PM

## 2023-12-20 NOTE — Progress Notes (Signed)
 PROGRESS NOTE   Subjective/Complaints: No events overnight.   ROS: Denies fevers, chills, N/V, abdominal pain, constipation, diarrhea, SOB, cough, chest pain, new weakness or paraesthesias.   + Bilateral lower extremity spasms --essentially resolved + Bilateral lower extremity burning pain--improved, but ongoing + Lethargy --improved + Orthostasis--improved  Objective:   No results found.   Recent Labs    12/18/23 0510  WBC 5.2  HGB 11.1*  HCT 33.1*  PLT 261   Recent Labs    12/18/23 0510  NA 139  K 4.1  CL 112*  CO2 20*  GLUCOSE 114*  BUN 27*  CREATININE 1.37*  CALCIUM  8.7*    Intake/Output Summary (Last 24 hours) at 12/20/2023 0843 Last data filed at 12/20/2023 0745 Gross per 24 hour  Intake 952 ml  Output 825 ml  Net 127 ml        Physical Exam: Vital Signs Blood pressure (!) 141/83, pulse 80, temperature 97.7 F (36.5 C), temperature source Oral, resp. rate 18, height 5' 8 (1.727 m), weight 88 kg, SpO2 96%.  Constitutional: No apparent distress.  Sitting upright in bed. HENT: Bangs, AT Eyes: PERRLA. EOMI. Visual fields grossly intact.  + rolling of right eye during fixation intermittently-increased today.  Cardiovascular: RRR, no murmurs/rub/gallops. No Edema.  Respiratory: CTAB. No rales, rhonchi, or wheezing. On RA.  Abdomen: + bowel sounds, normoactive. No distention or tenderness.  Skin: C/D/I.   -Surgical site well-approximated -palpable fluctuance at the top of the surgical site and along R lateral margin,   no erythema or drainage--reduced from prior exams  MSK:      No apparent deformity.  Moving all 4 limbs full AROM    Neuro: Alert and oriented x 3. Normal insight and awareness.  +  moderate cognitive deficits.  Moderate memory deficits  Cranial nerve exam unremarkable.   MMT:  BUE 5/5 prox to distal.  RLE 4+/5 HF, KE and 5/5 ADF/PF.   LLE 5-/5 HF, KE and 5/5 ADF/PF.     Altered sensation to light touch in bilateral lower extremities extending from the feet to the mid shin.   BLE  LE ataxia -improving  DTR's hyperreflexic L>R patella  Bilateral upper extremity reflexes 2+.  Negative Hoffmann's. + Clonus on ankle jerk  3-4 beats on the left ; none on the right No apparent tone    Assessment/Plan: 1. Functional deficits which require 3+ hours per day of interdisciplinary therapy in a comprehensive inpatient rehab setting. Physiatrist is providing close team supervision and 24 hour management of active medical problems listed below. Physiatrist and rehab team continue to assess barriers to discharge/monitor patient progress toward functional and medical goals  Care Tool:  Bathing    Body parts bathed by patient: Right arm, Left arm, Chest, Abdomen, Front perineal area, Right upper leg, Left upper leg, Face   Body parts bathed by helper: Right lower leg, Left lower leg, Buttocks     Bathing assist Assist Level: Supervision/Verbal cueing     Upper Body Dressing/Undressing Upper body dressing   What is the patient wearing?: Pull over shirt, Orthosis    Upper body assist Assist Level: Supervision/Verbal cueing    Lower Body  Dressing/Undressing Lower body dressing      What is the patient wearing?: Underwear/pull up, Pants     Lower body assist Assist for lower body dressing: Supervision/Verbal cueing     Toileting Toileting    Toileting assist Assist for toileting: Supervision/Verbal cueing     Transfers Chair/bed transfer  Transfers assist     Chair/bed transfer assist level: Supervision/Verbal cueing     Locomotion Ambulation   Ambulation assist      Assist level: Contact Guard/Touching assist Assistive device: Walker-rolling Max distance: 150   Walk 10 feet activity   Assist     Assist level: Contact Guard/Touching assist Assistive device: Walker-rolling   Walk 50 feet activity   Assist    Assist  level: Contact Guard/Touching assist Assistive device: Walker-rolling    Walk 150 feet activity   Assist Walk 150 feet activity did not occur: Safety/medical concerns (fatigue)  Assist level: Contact Guard/Touching assist Assistive device: Walker-rolling    Walk 10 feet on uneven surface  activity   Assist Walk 10 feet on uneven surfaces activity did not occur: Safety/medical concerns   Assist level: Contact Guard/Touching assist Assistive device: Walker-rolling   Wheelchair     Assist Is the patient using a wheelchair?: No (ambulates on unit, uses WC as transport) Type of Wheelchair: Manual    Wheelchair assist level: Supervision/Verbal cueing Max wheelchair distance: 150'    Wheelchair 50 feet with 2 turns activity    Assist        Assist Level: Supervision/Verbal cueing   Wheelchair 150 feet activity     Assist      Assist Level: Supervision/Verbal cueing   Blood pressure (!) 141/83, pulse 80, temperature 97.7 F (36.5 C), temperature source Oral, resp. rate 18, height 5' 8 (1.727 m), weight 88 kg, SpO2 96%.  1. 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              -patient may shower if wound is covered             -ELOS/Goals: 11-13 days, mod I to supervision goals -- 8/27 DC  - Stable to continue inpatient rehab  -Team conference tomorrow    - 8-14: Developing hyperreflexia R lower extremity greater than left lower extremity; generalized fatigue; abnormal eye rolling movements on exam.  EEG today, strength and sensation in the lower extremities is consistent with prior exams so initial hyperreflexia was likely just postop.  Will monitor closely for neurologic changes.   8-15: EEG negative, eye rolling less apparent today.  Is improving strength in bilateral lower extremities, going from post-op hyporeflexia and hyperreflexic  bilaterally.  Therapy performance improving today as compared to yesterday. monitor closely for neurologic changes over the weekend--per therapies, he does a poor job of maintaining spinal precautions in bed.   8/19: Caregiver support ?; uncertain of GF vs. Home nurse (2 weeks on discharge). Cog now improving to where he can repeat back precautions but too impulsive to follow them. CGA with gait 175 ft with RW; self-limiting. SPV to Min A cog; difficulty transitioning from structured to functional tasks.    8/25: Had a bad day with therapies yesterday, better today. Feel like he has plateaued. Spasms have been well controlled. Poor sdafety awareness and precaution adherence. Was knee buckling a lot yesterday - CGA to SPV today. Consistent cognitive issues. CGA mostly for transfers and gait - walked up to  300 ft today with RW. Team does not think he would improve with further IRF. Divided attention and awareness deficits - breaks down during tasks.   2.  Antithrombotics: -DVT/anticoagulation:  Mechanical: Sequential compression devices, below knee Bilateral lower extremities             -antiplatelet therapy: N/A--colchicine  for gout  3. Pain Management: Tylenol  prn mild pain or oxycodone  prn severe pain             -encourage pre-treatment with oxycodone  prior to therapies for better activity tolerance--tolerating today   - 8/13: Hx alcohol  abuse, severe neuropathic pain overlying radicular pain - will get B12, folate, B6 levels with next labs--B12 high, folate/B6 look okay--DC B12   8-14:  switch her over to tizanidine  every 8 hours as needed for spasticity; he also had hallucinations with baclofen in the past--doing better with tizanidine   Kpad ordered   - 8-18: Reduce oxycodone  to 5 mg 3 times daily as needed for severe pain, increase Tylenol  to 500 to 1000 mg 3 times daily as needed for mild to moderate pain    - 8/19: Schedule tylenol ; add dantrolene  25 mg daily for spasms, check LFTs in 1  week prior to increase. -- CMP 8/23 with LFTs normal  - Reports by caregiver patient was taking hydrocodone  at home; will return to home regimen at discharge--discussed this with patient 8-20  - 8-20: Add Lyrica  25 mg daily and 50 mg nightly for severe back pain, bilateral neuropathy  - 8/21: Increase lyirca to 50 mg TID; add additional 50 mg nightly for burning pain.  Would increase duloxetine  next.  8-22: Too sedated with Lyrica  50/50/100. Increase duloxetine  to 60 mg in the morning, 30 mg at night; reduce standing lyrica  to 25 mg TID and keep 50 mg at bedtime PRN. Labs in am.  8/23: Tolerating current dose of Lyrica  better.  DC tizanidine , with normal LFTs will switch to dantrolene  25 mg 3 times daily today for spasms. 8-24: Per chart, increase Lyrica  to 50 mg 3 times daily.  Will reduce nighttime as needed to 25 mg to reduce likelihood of daytime sedation. -8/25 patient reports neuropathy pain is better controlled, continue current regimen Addendum will decrease lyrica  frequency due to report of worsening balance. Consider Qutenza outpatient   8-26: Patient doing much better today with reduce Lyrica , still having pain in his feet but discussed that this is going to be a balance of his cognition.  Increase Cymbalta  to 60 mg twice daily, told him this will take several weeks to appreciate effects.  Hopeful to wean off Lyrica  as outpatient, trial of Qutenza.  4. Mood/Behavior/Sleep: LCSW to follow for evaluation and support.              -antipsychotic agents: N/A  5. Neuropsych/cognition/Hx Wernicke's encephalopathy: This patient is capable of making decisions on his own behalf.   - Reviewed notes from [facility] neurology 10-2023; does not meet criteria for idiopathic Parkinson's at this time, but is undergoing current evaluation.  Has history of Wernicke's encephalopathy and was continued to drink at time of last exam.  - On thiamine , B12 supplement.  - 8/14: EEG for abnormal eye rolling movements  on exam, fatigue; negative.  Talking with staff, was present on admission but is not as noticeable. 8-15: EEG negative.  Neuropsych noted some attention and memory deficits, but not profound.    - 8/19: Anticipate 100% supervision at home; discussed with the patient's caregiver, will get MRI given concerns  for hitting his head during prior falls and possible overlying TBI--negative for acute changes, likely overlying concussion. Discussed results with patient and likely concussion 8-20 Will f/u with Neuro Dr. Evonnie as OP   -discussed eye rolling behavior with Dr. Michaela, likely secondary to [facility] and exacerbated by concussion.  No further workup warranted.  6. Skin/Wound Care: Monitor incision for healing.  -dressing changes as needed. -Routine pressure relief measures.  -8/14: Appears with adhesive allergy on back; dressings removed. surgical site appears stable. - 8-15: Some fluctuance on the top and bottom of surgical site - nontender, no drainage, notably improving neurologic exam. Monitor for now.  8-18: Significant increase in fluctuance from last week, informed neurosurgery PA due to concern for reaccumulating seroma; continuing to make therapy progress-- Appreciate Suzen Pean NP coming in, no concerning changes with wound -has had some very mild increase in fluctuance along the right lateral margin but overall remains unchanged 8-26: Fluctuance reducing, wound site appears stable.  7. Fluids/Electrolytes/Nutrition: Monitor I/O. Check CMET in am.    - Ca Below  8.  HTN: Monitor BP TID--on Amlodipine  and Avapro .   - 8/25 well-controlled continue current regimen    12/20/2023    4:46 AM 12/19/2023    7:39 PM 12/19/2023    1:11 PM  Vitals with BMI  Systolic 141 137 871  Diastolic 83 78 72  Pulse 80 85 80    9.  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 on 08/07-->recheck labs in am  - 8-13: Stable  creatinine, increased BUN on a.m. labs; encourage p.o. fluids, recheck in 2 days  8/18: Creatinine 1.41 , BUN improved, monitor  8/21: Cr 1.47, BUN up - PO fluids remain minimal - push today  8-23: Creatinine 1.6, will give 1 L IV fluid bolus today.  Repeat in AM.  8-24: Creatinine back to baseline.  Continue to encourage p.o. intakes.  8/25 creatinine/BUN stable at 1.37/27.  Continue to encourage p.o. fluid intake   - DC IV  11.  Anemia: Hgb 11.5 on 08/07--> recheck cbc in am.  Of note, has needed multiple blood transfusions s/p fall May of this year.  - 8-13: 10.5, within limits of variability.  Endorses no bleeding.  Trend, get iron studies, vitamin C, folate, B12 with next labs.   8/14: Fe low IDA despite supplement TID - in setting of CKD - may need venofer infusion if downtrends  8-25 hemoglobin stable 11.1  9.  H/o Gout: continue colchicine .   10. Hypothyroid: continue synthroid  for supplement.   11. Anxiety/alcohol  abuse: continue Klonopin  at bedtime prn. - Provide education - 8-18: Caregiver reports estimate drinking about a bottle of wine per night at home prior to admission.  12. Hx bladder cancer: Treated with TURBT/infusion by Dr. Devere             -see #9             -monitor voiding patterns.             -I/O caths prn - voiding continent  13. Neurogenic bowel/constipation   Senakot changed to 2 Senna HS  Colace decreased to BID  - Last bowel movement 8-16, medium   - 8/19: Multiple BM this AM  - DC at bedtime sennakot, add PRN miralax  17 g daily   - 8/22: Increase to colace 200 mg bid  - 8/23: Add bowel program ordered but was not being  done?  DC.  Resume Senokot nightly.  Give sorbitol  today.   8-24: Results with enema this a.m.  Increase nightly Senokot to 2 tabs.  Patient endorses no incontinent episodes, does not feel the urge to defecate but has sensation of defecation and passing gas when it occurs.  Given no incontinence, will continue to hold off on bowel  program.   8/26 - add miralax  daily - would send home with PRN sorbitol  to use if no bowel movements within 3 to 4 days.  Did talk about possibility of bowel program as outpatient and use of suppositories, however patient feels that he is doing well on his current regimen and would prefer to stay oral if possible.  14.  Orthostatic hypotension.  Likely due to dehydration plus tizanidine  as above.  - Give 1 L IV fluids today  - Repeat labs in a.m.  - DC tizanidine ; switch to dantrolene  for muscle spasms  8-24: Orthostats negative, blood pressure much improved.  LOS: 15 days A FACE TO FACE EVALUATION WAS PERFORMED  Miguel Williams Likes 12/20/2023, 8:43 AM

## 2023-12-21 ENCOUNTER — Other Ambulatory Visit: Payer: Self-pay

## 2023-12-21 ENCOUNTER — Telehealth: Payer: Self-pay

## 2023-12-21 ENCOUNTER — Emergency Department (HOSPITAL_COMMUNITY)
Admission: EM | Admit: 2023-12-21 | Discharge: 2023-12-21 | Disposition: A | Discharge status: Home / Self Care | Attending: Emergency Medicine | Admitting: Emergency Medicine

## 2023-12-21 DIAGNOSIS — N183 Chronic kidney disease, stage 3 unspecified: Secondary | ICD-10-CM | POA: Insufficient documentation

## 2023-12-21 DIAGNOSIS — Z794 Long term (current) use of insulin: Secondary | ICD-10-CM | POA: Insufficient documentation

## 2023-12-21 DIAGNOSIS — M79604 Pain in right leg: Secondary | ICD-10-CM | POA: Diagnosis present

## 2023-12-21 DIAGNOSIS — J449 Chronic obstructive pulmonary disease, unspecified: Secondary | ICD-10-CM | POA: Insufficient documentation

## 2023-12-21 DIAGNOSIS — Z79899 Other long term (current) drug therapy: Secondary | ICD-10-CM | POA: Diagnosis not present

## 2023-12-21 DIAGNOSIS — R000 Tachycardia, unspecified: Secondary | ICD-10-CM | POA: Diagnosis not present

## 2023-12-21 MED ORDER — SODIUM CHLORIDE 0.9 % IV BOLUS
1000.0000 mL | Freq: Once | INTRAVENOUS | Status: AC
  Administered 2023-12-21: 1000 mL via INTRAVENOUS

## 2023-12-21 MED ORDER — FENTANYL CITRATE PF 50 MCG/ML IJ SOSY: 50.0000 ug | PREFILLED_SYRINGE | Freq: Once | INTRAMUSCULAR | Status: DC

## 2023-12-21 MED ORDER — HYDROCODONE-ACETAMINOPHEN 5-325 MG PO TABS: 1.0000 | ORAL_TABLET | Freq: Four times a day (QID) | ORAL | 0 refills | Status: DC | PRN

## 2023-12-21 MED ORDER — FENTANYL CITRATE PF 50 MCG/ML IJ SOSY
50.0000 ug | PREFILLED_SYRINGE | Freq: Once | INTRAMUSCULAR | Status: AC
  Administered 2023-12-21: 50 ug via INTRAVENOUS
  Filled 2023-12-21: qty 1

## 2023-12-21 NOTE — Telephone Encounter (Signed)
 No TC Call was performed for this patient due to patient currently being assessed in the emergency department due to Right Leg Pain on 12/21/2023.

## 2023-12-21 NOTE — ED Provider Notes (Signed)
 [place] EMERGENCY DEPARTMENT AT [facility] Provider Note   CSN: [id number] Arrival date & time: 12/21/23  0125     Patient presents with: Leg Pain   Miguel Williams is a 78 y.o. male with extensive history to include CKD stage III, COPD, bipolar disorder, asthma, Warnicke's encephalopathy, chronic insomnia.  Patient recently discharge from the hospital today.  Patient was in the hospital for extended amount of time.  Patient initially underwent lumbar decompression with interbody fusion of L4/5 and L5-S1 on 11/15/2023.  Patient discharged on 7/24 but readmitted on 8/7 with reports of frequent falls and bilateral extremity weakness.  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  Patient underwent reexploration of lumbar wound with evacuation of old blood of the seroma and placement of JP drain with Dr. [name] on 8/8.  Drain was removed on 8/12.  Patient remained in the hospital until discharge date 8/27 for continued pain and physical medicine and rehabilitation.  Patient returns to ED tonight stating pain is not controlled.  Reports pain described as numbness and throbbing down right leg.  States that he recently started gabapentin .  Reports that he did not receive pain medication to go home with.  Denies any new symptoms.  Denies chest pain, shortness of breath, nausea, vomiting, abdominal pain.  Reports back pain is not his biggest concern at this time, right leg pain worse.   Leg Pain      Prior to Admission medications   Medication Sig Start Date End Date Taking? Authorizing Provider  HYDROcodone -acetaminophen  (NORCO/VICODIN) 5-325 MG tablet Take 1 tablet by mouth every 6 (six) hours as needed for severe pain (pain score 7-10). 12/21/23  Yes Ruthell Lonni FALCON, PA-C  acetaminophen  (TYLENOL ) 500 MG tablet Take 2 tablets (1,000 mg total) by mouth in  the morning, at noon, and at bedtime. 12/19/23   Maurice Sharlet RAMAN, PA-C  artificial tears ophthalmic solution Place 1 drop into both eyes 4 (four) times daily as needed for dry eyes. 12/19/23   Love, Sharlet RAMAN, PA-C  atorvastatin  (LIPITOR) 40 MG tablet Take 40 mg by mouth in the morning.    [provider]  betamethasone dipropionate 0.05 % cream Apply 1 Application topically 2 (two) times daily as needed (skin irritation.).    [provider]  calcium -vitamin D (OSCAL WITH D) 500-5 MG-MCG tablet Take 1 tablet by mouth.    [provider]  cetaphil (CETAPHIL) lotion Apply topically as needed for dry skin. 12/19/23   Love, Sharlet RAMAN, PA-C  clonazePAM  (KLONOPIN ) 0.5 MG tablet Take 0.5 mg by mouth at bedtime as needed (sleep/anxiety). 10/21/21   [provider]  colchicine  0.6 MG tablet Take 0.6 mg by mouth in the morning.    [provider]  cyanocobalamin  (VITAMIN B12) 500 MCG tablet Take 500 mcg by mouth daily.    [provider]  dantrolene  (DANTRIUM ) 25 MG capsule Take 1 capsule (25 mg total) by mouth 3 (three) times daily. 12/19/23   Love, Sharlet RAMAN, PA-C  docusate sodium  (COLACE) 100 MG capsule Take 2 capsules (200 mg total) by mouth 2 (two) times daily. 12/19/23   Love, Sharlet RAMAN, PA-C  DULoxetine  (CYMBALTA ) 60 MG capsule Take 1 capsule (60 mg total) by mouth 2 (two) times daily. 12/19/23   Love, Sharlet RAMAN, PA-C  dutasteride  (AVODART ) 0.5 MG capsule Take 1 capsule (0.5 mg total) by mouth  every other day. Patient taking differently: Take 0.5 mg by mouth every evening. 02/12/13   Harvey Seltzer, MD  ezetimibe  (ZETIA ) 10 MG tablet Take 10 mg by mouth in the morning. 11/01/21   [provider]  ferrous sulfate  325 (65 FE) MG EC tablet Take 325 mg by mouth 3 (three) times daily with meals.    [provider]  Inulin  (FIBER CHOICE PO) Take 2 capsules by mouth daily.    [provider]  lansoprazole (PREVACID) 30 MG capsule Take 30 mg by  mouth in the morning and at bedtime.    [provider]  levothyroxine  (SYNTHROID ) 50 MCG tablet Take 50-100 mcg by mouth See admin instructions. Take 2 tablets (100 mcg) by mouth in the morning on Sundays.  Take 1 tablet (50 mcg) by mouth in the morning on Mondays, Tuesdays, Wednesdays, Thursdays, Fridays & Saturdays. 12/21/21   [provider]  loperamide  (IMODIUM  A-D) 2 MG tablet Take 2 mg by mouth 4 (four) times daily as needed for diarrhea or loose stools.    [provider]  melatonin 5 MG TABS Take 1 tablet (5 mg total) by mouth at bedtime as needed. 12/19/23   Love, Sharlet RAMAN, PA-C  polyethylene glycol powder (GLYCOLAX /MIRALAX ) 17 GM/SCOOP powder Mix 1 capful (17 g) with water  and drink by mouth daily. 12/19/23   Love, Sharlet RAMAN, PA-C  pregabalin  (LYRICA ) 25 MG capsule Take 1 capsule (25 mg total) by mouth 3 (three) times daily. 12/19/23   Love, Sharlet RAMAN, PA-C  senna (SENOKOT) 8.6 MG TABS tablet Take 2 tablets (17.2 mg total) by mouth at bedtime. 12/19/23   Love, Sharlet RAMAN, PA-C  sorbitol  70 % SOLN Take 60 mLs by mouth daily as needed for moderate constipation. 12/19/23   Love, Sharlet RAMAN, PA-C  SYSTANE COMPLETE PF 0.6 % SOLN Place 1 drop into both eyes 4 (four) times daily as needed (for dryness).    [provider]  Tamsulosin  HCl (FLOMAX ) 0.4 MG CAPS TAKE 1 CAPSULE (0.4 MG TOTAL) BY MOUTH DAILY. 04/04/12   Harvey Seltzer, MD  thiamine  (VITAMIN B-1) 100 MG tablet Take 1 tablet (100 mg total) by mouth daily. 08/12/23   [name], [name], MD  traMADol  (ULTRAM ) 50 MG tablet Take 50 mg by mouth every 6 (six) hours as needed (pain).    [provider]  valsartan (DIOVAN) 80 MG tablet Take 80 mg by mouth at bedtime. 03/19/19   [provider]  VITAMIN D, CHOLECALCIFEROL , PO Take 1 capsule by mouth daily.    [provider]    Allergies: Baclofen, Bacitracin-polymyxin b, Tegretol  [carbamazepine ], Allopurinol, Neomycin, and Sulfa antibiotics     Review of Systems  All other systems reviewed and are negative.   Updated Vital Signs BP 118/63   Pulse 96   Temp 97.9 F (36.6 C) (Oral)   Resp 10   Ht 5' 8 (1.727 m)   Wt 88 kg   SpO2 93%   BMI 29.50 kg/m   Physical Exam Vitals and nursing note reviewed.  Constitutional:      General: He is not in acute distress.    Appearance: He is well-developed.  HENT:     Head: Normocephalic and atraumatic.  Eyes:     Conjunctiva/sclera: Conjunctivae normal.  Cardiovascular:     Rate and Rhythm: Regular rhythm. Tachycardia present.     Heart sounds: No murmur heard. Pulmonary:     Effort: Pulmonary effort is normal. No respiratory distress.  Breath sounds: Normal breath sounds.  Abdominal:     Palpations: Abdomen is soft.     Tenderness: There is no abdominal tenderness.  Musculoskeletal:        General: No swelling.     Cervical back: Neck supple.     Comments: Lumbar spinal wound appears clean dry and intact  Skin:    General: Skin is warm and dry.     Capillary Refill: Capillary refill takes less than 2 seconds.  Neurological:     Mental Status: He is alert.     Comments: 5 out of 5 strength to the lower extremities bilaterally  Psychiatric:        Mood and Affect: Mood normal.     (all labs ordered are listed, but only abnormal results are displayed) Labs Reviewed - No data to display  EKG: None  Radiology: No results found.  Procedures   Medications Ordered in the ED  sodium chloride  0.9 % bolus 1,000 mL (1,000 mLs Intravenous New Bag/Given 12/21/23 0621)  fentaNYL  (SUBLIMAZE ) injection 50 mcg (50 mcg Intravenous Given 12/21/23 0620)    Medical Decision Making Amount and/or Complexity of Data Reviewed ECG/medicine tests: ordered.  Risk Prescription drug management.   This is a 78 year old male presenting to the ED out of concern of pain control.  Report he was recently discharged in the hospital today.  Chart reviewed, patient was admitted  and ultimately discharged after being found to have seroma in lumbar spine after lumbar spinal fusion.  Patient apparently was not discharged with pain medication.  He states this main issue tonight is pain control.  On exam the patient is afebrile, tachycardic to 113.  His lung sounds are clear bilaterally, he has nonhypoxic.  Abdomen is soft and compressible.  Neuroexam at baseline.  5 out of 5 strength bilateral lower extremities.  Lumbar spinal wound appears clean dry and intact.  Had long discussion with patient.  Patient reports main issue tonight being pain control.  He was given 50 mcg of fentanyl  and reports that his pain has decreased at this time.  He did arrive slightly hypotensive and tachycardic however his blood pressure normalized as well as his tachycardia after receiving 500 mL of fluid.  He will be discharged home at this time and advised to follow-up with his PMR doctor.  Was given return precautions and he voiced understanding.  Will be sent home with 15 tablets of hydrocodone .  I did have a long discussion with the patient regarding risk and benefits of hydrocodone  pain medication.  Chart was reviewed and the patient has been noted to have an unsteady gait.  Advised the patient that any narcotic pain medication will worsen his gait and could result in injuries.  Patient acknowledged my concern and still requested pain medication.  He knowledges the risk benefits of this.  I advised the patient that he will need to start pain control with Tylenol  and ibuprofen and the proceed to hydrocodone  if pain not controlled.  I advised him to continue taking gabapentin  for pain which he has at home.  He voiced understanding of instructions.  He all of his questions answered to his satisfaction.  He stable to discharge home    Final diagnoses:  Right leg pain    ED Discharge Orders          Ordered    HYDROcodone -acetaminophen  (NORCO/VICODIN) 5-325 MG tablet  Every 6 hours PRN         12/21/23 9380  Ruthell Lonni FALCON, PA-C 12/21/23 9359    Lorette Mayo, MD 12/21/23 [phone]

## 2023-12-21 NOTE — ED Triage Notes (Signed)
 PT BIB family member due to being discharged today from the rehab floor, and his pain is not controlled. Pt having severe right leg pain. Pt states when he was discharged he was not given any pain medication because they said he should still have some from his previous admission. He states he only had 2 left and they havent helped his pain. Patients original surgery was in July.

## 2023-12-21 NOTE — Discharge Instructions (Signed)
 As discussed, please control pain with Tylenol /ibuprofen at home.  If pain not well-controlled, please proceed to hydrocodone  pain medication.  As we discussed, hydrocodone  will worsen your gait and cause you to potentially fall.  You have acknowledged these risks.  Please follow-up with your PMR doctor at earliest convenience.  Return to ED with new symptoms.

## 2024-01-02 ENCOUNTER — Encounter: Payer: Self-pay | Admitting: Registered Nurse

## 2024-01-02 ENCOUNTER — Encounter: Attending: Registered Nurse | Admitting: Registered Nurse

## 2024-01-02 VITALS — BP 121/79 | HR 104 | Ht 68.0 in | Wt 202.2 lb

## 2024-01-02 DIAGNOSIS — M48062 Spinal stenosis, lumbar region with neurogenic claudication: Secondary | ICD-10-CM | POA: Insufficient documentation

## 2024-01-02 MED ORDER — DANTROLENE SODIUM 25 MG PO CAPS: 25.0000 mg | ORAL_CAPSULE | Freq: Three times a day (TID) | ORAL | 1 refills | Status: DC

## 2024-01-02 MED ORDER — DULOXETINE HCL 60 MG PO CPEP: 60.0000 mg | ORAL_CAPSULE | Freq: Two times a day (BID) | ORAL | 1 refills | Status: DC

## 2024-01-02 NOTE — Progress Notes (Signed)
 Subjective:    Patient ID: Miguel Williams, male    DOB: [birth year], 78 y.o.   MRN: [id number]  HPI: Miguel Williams is a 78 y.o. male who is here for HFU appointment for F/U of his  Neurogenic Claudication due to Lumbar spinal stenosis . He came to [facility] ED on 11/30/2023 with complaints of lower back pain and weakness.   DR. Emil: H&P: 11/30/2023  78 yo M with a chief complaint of low back pain and weakness. The patient states that he has been having trouble with bilateral leg pain ever since he had a neurosurgical procedure about 2 weeks ago. He is unfortunately had a few falls. He has not yet seen his neurosurgeon in follow-up. Friends came to visit him and they were worried and brought him here for evaluation. He says that he has trouble with constipation alternating with diarrhea. He does not think that significantly changed. Denies loss of bowel or bladder. Does feel like both of his legs are weak but does not feel like they are numb.   MR: Lumbar: IMPRESSION: 1. Large 6.8 cm posterior paraspinal fluid collection, likely postoperative seroma given recent L4-S1 PLIF but sterility indeterminate by MRI. 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 2. At L5-S1, moderate bilateral foraminal stenosis.  Neurosurgery consulted.   [name] underwent: on 12/01/2023 LUMBAR WOUND DEBRIDEMENT   [name] was admitted to inpatient rehabilitation on 12/05/2023 and discharged home on 12/20/2023. He is receiving Home Health Therapy with [facility]. He states his pain is located in his bilateral feet with tingling. He rates his pain 2.   Also reports he has a good appetite.      Pain Inventory Average Pain 5 Pain Right Now 2 My pain is burning, dull, and aching  LOCATION OF PAIN  leg ankle toes  BOWEL Number of stools per week:  5  BLADDER Normal  Mobility use a walker ability to climb steps?  no do you drive?  no needs help with transfers  Function retired I need assistance with the following:  dressing, bathing, toileting, meal prep, household duties, and shopping  Neuro/Psych tingling trouble walking  Prior Studies Any changes since last visit?  no  Physicians involved in your care Any changes since last visit?  yes  has seen Dr Eliott NP, has appt with PCP on Friday 9/12, and Dr Tat needs to be scheduled   Family History  Problem Relation Age of Onset   Multiple myeloma Mother    Lung cancer Father    Social History   Socioeconomic History   Marital status: Single    Spouse name: Not on file   Number of children: 0   Years of education: college   Highest education level: Not on file  Occupational History    Employer: [employer]   Occupation: retired    Comment: Counsellor  Tobacco Use   Smoking status: Never   Smokeless tobacco: Never  Vaping Use   Vaping status: Never Used  Substance and Sexual Activity   Alcohol  use: Yes    Comment: 3oz. of wine every other day as of 10/2023   Drug use: Not Currently   Sexual activity: Not Currently    Partners: Female  Other Topics Concern   Not on file  Social History Narrative   Left handed    Social Drivers of Health  Financial Resource Strain: Not on file  Food Insecurity: No Food Insecurity (12/01/2023)   Hunger Vital Sign    Worried About Running Out of Food in the Last Year: Never true    Ran Out of Food in the Last Year: Never true  Transportation Needs: No Transportation Needs (12/01/2023)   PRAPARE - Administrator, Civil Service (Medical): No    Lack of Transportation (Non-Medical): No  Physical Activity: Not on file  Stress: Not on file  Social Connections: Unknown (12/01/2023)   Social Connection and Isolation Panel    Frequency of Communication with Friends and Family: Three times a week     Frequency of Social Gatherings with Friends and Family: Three times a week    Attends Religious Services: More than 4 times per year    Active Member of Clubs or Organizations: Yes    Attends Banker Meetings: More than 4 times per year    Marital Status: Patient declined   Past Surgical History:  Procedure Laterality Date   CATARACT EXTRACTION W/ INTRAOCULAR LENS IMPLANT Bilateral    COLONOSCOPY  04/2023   COLONOSCOPY W/ ENDOSCOPIC US      CYSTOSCOPY WITH FULGERATION N/A 03/09/2023   Procedure: CYSTOSCOPY WITH FULGERATION WITH CLOT EVACUATION;  Surgeon: Devere Lonni Righter, MD;  Location: [facility];  Service: Urology;  Laterality: N/A;   CYSTOSCOPY WITH URETHRAL DILATATION N/A 03/08/2023   Procedure: CYSTOSCOPY;  Surgeon: Devere Lonni Righter, MD;  Location: [facility];  Service: Urology;  Laterality: N/A;  30 MINUTES   ESOPHAGOGASTRODUODENOSCOPY     LUMBAR WOUND DEBRIDEMENT N/A 12/01/2023   Procedure: LUMBAR WOUND DEBRIDEMENT;  Surgeon: [name], MD;  Location: [facility] OR;  Service: Neurosurgery;  Laterality: N/A;   TONSILLECTOMY  04/25/1951   Past Medical History:  Diagnosis Date   Abdominal pain, lower    Agitation    Allergy, unspecified, initial encounter    Anxiety disorder    Arthritis    Asthma    As a child   Bipolar disorder [facility])    Patient denies   Cancer [facility])    Bladder   Cancer (HCC)    Skin cancer   Chronic kidney disease, stage III (moderate) (HCC)    Chronic kidney disease, stage III (moderate) (HCC)    COPD (chronic obstructive pulmonary disease) (HCC)    Pt denies (10/2023)   Coronary atherosclerosis of native coronary artery    Esophageal reflux    Fatigue    Fungal granuloma    Hip pain    History of 2019 novel coronavirus disease (COVID-19)    History of endoscopy 02/00/2011   History of kidney stones    Hypertension    Hypo-osmolality and hyponatremia    Hypokalemia    Hypothyroidism    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 now has 3oz of wine every other day (as of 10/2023)  Umbilical hernia    Unspecified fall, subsequent encounter    Vitamin D deficiency, unspecified    Wernicke's encephalopathy 06/23/2019   BP 121/79   Pulse (!) 111   Ht 5' 8 (1.727 m)   Wt 202 lb 3.2 oz (91.7 kg)   SpO2 91%   BMI 30.74 kg/m   Opioid Risk Score:   Fall Risk Score:  `1  Depression screen [facility] 2/9     01/02/2024    2:40 PM  Depression screen PHQ 2/9  Decreased Interest 0  Down, Depressed, Hopeless 0  PHQ - 2 Score 0  Altered sleeping 0  Tired, decreased energy 0  Change in appetite 0  Feeling bad or failure about yourself  0  Trouble concentrating 0  Moving slowly or fidgety/restless 0  Suicidal thoughts 0  PHQ-9 Score 0     Review of Systems  Musculoskeletal:  Positive for gait problem.  Neurological:        Tingling  All other systems reviewed and are negative.      Objective:   Physical Exam Vitals and nursing note reviewed.  Constitutional:      Appearance: Normal appearance.  Cardiovascular:     Rate and Rhythm: Normal rate and regular rhythm.     Pulses: Normal pulses.     Heart sounds: Normal heart sounds.  Pulmonary:     Effort: Pulmonary effort is normal.     Breath sounds: Normal breath sounds.  Musculoskeletal:     Comments: Normal Muscle Bulk and Muscle Testing Reveals:  Upper Extremities: Full ROM and Muscle Strength 5/5  Lower Extremities: Full ROM and Muscle Strength 5/5 Arrived  in wheelchair      Skin:    General: Skin is warm and dry.  Neurological:     Mental Status: He is alert and oriented to person, place, and time.  Psychiatric:        Mood and Affect: Mood normal.        Behavior: Behavior normal.          Assessment & Plan:  Neurogenic Claudication due to Lumbar spinal stenosis : Neurosurgery following. He underwent on 11/15/2023: Dr. [name].  POSTERIOR LUMBAR FUSION 2 LEVEL N/A General  PLIF - L4-L5 - L5-S1 - Posterior Lateral and Interbody fusion    S/P : on 12/01/2023 LUMBAR WOUND DEBRIDEMENT    He is receiving Home Health Therapy with [facility].   F./U with Dr Emeline in 4-6 weeks

## 2024-01-07 ENCOUNTER — Encounter: Payer: Self-pay | Admitting: Neurology

## 2024-01-12 ENCOUNTER — Ambulatory Visit (HOSPITAL_BASED_OUTPATIENT_CLINIC_OR_DEPARTMENT_OTHER)
Admission: RE | Admit: 2024-01-12 | Discharge: 2024-01-12 | Disposition: A | Discharge status: Home / Self Care | Source: Ambulatory Visit | Attending: Neurology | Admitting: Neurology

## 2024-01-12 DIAGNOSIS — M5412 Radiculopathy, cervical region: Secondary | ICD-10-CM | POA: Insufficient documentation

## 2024-01-12 DIAGNOSIS — R292 Abnormal reflex: Secondary | ICD-10-CM | POA: Diagnosis present

## 2024-01-16 ENCOUNTER — Ambulatory Visit: Payer: Self-pay | Admitting: Neurology

## 2024-01-17 ENCOUNTER — Encounter (HOSPITAL_COMMUNITY): Payer: Self-pay

## 2024-01-17 ENCOUNTER — Encounter (HOSPITAL_COMMUNITY)
Admission: RE | Admit: 2024-01-17 | Discharge: 2024-01-17 | Disposition: A | Discharge status: Home / Self Care | Source: Ambulatory Visit | Attending: Neurology | Admitting: Neurology

## 2024-01-17 DIAGNOSIS — R251 Tremor, unspecified: Secondary | ICD-10-CM | POA: Insufficient documentation

## 2024-01-17 MED ORDER — POTASSIUM IODIDE (ANTIDOTE) 130 MG PO TABS
ORAL_TABLET | ORAL | Status: AC
  Filled 2024-01-17: qty 1

## 2024-01-17 MED ORDER — IOFLUPANE I 123 185 MBQ/2.5ML IV SOLN
5.0000 | Freq: Once | INTRAVENOUS | Status: AC | PRN
Start: 2024-01-17 — End: 2024-01-17
  Administered 2024-01-17: 4.45 via INTRAVENOUS

## 2024-01-18 ENCOUNTER — Ambulatory Visit: Payer: Self-pay | Admitting: Neurology

## 2024-01-19 ENCOUNTER — Encounter (HOSPITAL_COMMUNITY)

## 2024-01-31 ENCOUNTER — Emergency Department (HOSPITAL_COMMUNITY)

## 2024-01-31 ENCOUNTER — Other Ambulatory Visit: Payer: Self-pay

## 2024-01-31 ENCOUNTER — Encounter (HOSPITAL_COMMUNITY): Payer: Self-pay

## 2024-01-31 ENCOUNTER — Emergency Department (HOSPITAL_COMMUNITY)
Admission: EM | Admit: 2024-01-31 | Discharge: 2024-01-31 | Disposition: A | Discharge status: Home / Self Care | Attending: Emergency Medicine | Admitting: Emergency Medicine

## 2024-01-31 DIAGNOSIS — S060X0A Concussion without loss of consciousness, initial encounter: Secondary | ICD-10-CM | POA: Diagnosis not present

## 2024-01-31 DIAGNOSIS — Y9301 Activity, walking, marching and hiking: Secondary | ICD-10-CM | POA: Diagnosis not present

## 2024-01-31 DIAGNOSIS — Y92009 Unspecified place in unspecified non-institutional (private) residence as the place of occurrence of the external cause: Secondary | ICD-10-CM | POA: Insufficient documentation

## 2024-01-31 DIAGNOSIS — J449 Chronic obstructive pulmonary disease, unspecified: Secondary | ICD-10-CM | POA: Insufficient documentation

## 2024-01-31 DIAGNOSIS — R519 Headache, unspecified: Secondary | ICD-10-CM | POA: Diagnosis present

## 2024-01-31 DIAGNOSIS — W19XXXA Unspecified fall, initial encounter: Secondary | ICD-10-CM

## 2024-01-31 DIAGNOSIS — E039 Hypothyroidism, unspecified: Secondary | ICD-10-CM | POA: Diagnosis not present

## 2024-01-31 DIAGNOSIS — S0990XA Unspecified injury of head, initial encounter: Secondary | ICD-10-CM

## 2024-01-31 DIAGNOSIS — Z79899 Other long term (current) drug therapy: Secondary | ICD-10-CM | POA: Insufficient documentation

## 2024-01-31 DIAGNOSIS — N189 Chronic kidney disease, unspecified: Secondary | ICD-10-CM | POA: Insufficient documentation

## 2024-01-31 DIAGNOSIS — Z8551 Personal history of malignant neoplasm of bladder: Secondary | ICD-10-CM | POA: Insufficient documentation

## 2024-01-31 DIAGNOSIS — W0110XA Fall on same level from slipping, tripping and stumbling with subsequent striking against unspecified object, initial encounter: Secondary | ICD-10-CM | POA: Insufficient documentation

## 2024-01-31 DIAGNOSIS — I129 Hypertensive chronic kidney disease with stage 1 through stage 4 chronic kidney disease, or unspecified chronic kidney disease: Secondary | ICD-10-CM | POA: Insufficient documentation

## 2024-01-31 NOTE — ED Provider Notes (Signed)
 [place] EMERGENCY DEPARTMENT AT [facility] Provider Note   CSN: [id number] Arrival date & time: 01/31/24  2104     Patient presents with: No chief complaint on file.   Miguel Williams is a 78 y.o. male.   The history is provided by the patient, a caregiver and medical records. No language interpreter was used.  Fall This is a new problem. The current episode started 1 to 2 hours ago. The problem has not changed since onset.Associated symptoms include headaches. Pertinent negatives include no chest pain, no abdominal pain and no shortness of breath. Nothing aggravates the symptoms. Nothing relieves the symptoms. He has tried nothing for the symptoms. The treatment provided no relief.       Prior to Admission medications   Medication Sig Start Date End Date Taking? Authorizing Provider  acetaminophen  (TYLENOL ) 500 MG tablet Take 2 tablets (1,000 mg total) by mouth in the morning, at noon, and at bedtime. 12/19/23   Maurice Sharlet RAMAN, PA-C  artificial tears ophthalmic solution Place 1 drop into both eyes 4 (four) times daily as needed for dry eyes. 12/19/23   Love, Sharlet RAMAN, PA-C  atorvastatin  (LIPITOR) 40 MG tablet Take 40 mg by mouth in the morning.    [provider]  betamethasone dipropionate 0.05 % cream Apply 1 Application topically 2 (two) times daily as needed (skin irritation.).    [provider]  calcium -vitamin D (OSCAL WITH D) 500-5 MG-MCG tablet Take 1 tablet by mouth.    [provider]  cetaphil (CETAPHIL) lotion Apply topically as needed for dry skin. 12/19/23   Love, Sharlet RAMAN, PA-C  clonazePAM  (KLONOPIN ) 0.5 MG tablet Take 0.5 mg by mouth at bedtime as needed (sleep/anxiety). 10/21/21   [provider]  colchicine  0.6 MG tablet Take 0.6 mg by mouth in the morning.    [provider]  cyanocobalamin  (VITAMIN B12) 500 MCG tablet Take 500 mcg by mouth daily.    [provider]  dantrolene  (DANTRIUM ) 25 MG capsule  Take 1 capsule (25 mg total) by mouth 3 (three) times daily. 01/02/24   Debby Fidela CROME, NP  docusate sodium  (COLACE) 100 MG capsule Take 2 capsules (200 mg total) by mouth 2 (two) times daily. 12/19/23   Love, Sharlet RAMAN, PA-C  DULoxetine  (CYMBALTA ) 60 MG capsule Take 1 capsule (60 mg total) by mouth 2 (two) times daily. 01/02/24   Debby Fidela CROME, NP  dutasteride  (AVODART ) 0.5 MG capsule Take 1 capsule (0.5 mg total) by mouth every other day. Patient taking differently: Take 0.5 mg by mouth every evening. 02/12/13   Harvey Seltzer, MD  ezetimibe  (ZETIA ) 10 MG tablet Take 10 mg by mouth in the morning. 11/01/21   [provider]  ferrous sulfate  325 (65 FE) MG EC tablet Take 325 mg by mouth 3 (three) times daily with meals.    [provider]  Inulin  (FIBER CHOICE PO) Take 2 capsules by mouth daily.    [provider]  lansoprazole (PREVACID) 30 MG capsule Take 30 mg by mouth in the morning and at bedtime.    [provider]  levothyroxine  (SYNTHROID ) 50 MCG tablet Take 50-100 mcg by mouth See admin instructions. Take 2 tablets (100 mcg) by mouth in the morning on Sundays.  Take 1 tablet (50 mcg) by mouth in the morning on Mondays, Tuesdays, Wednesdays, Thursdays, Fridays & Saturdays. 12/21/21   [provider]  loperamide  (IMODIUM  A-D) 2 MG tablet Take 2 mg by mouth 4 (  four) times daily as needed for diarrhea or loose stools.    [provider]  melatonin 5 MG TABS Take 1 tablet (5 mg total) by mouth at bedtime as needed. 12/19/23   Love, Sharlet RAMAN, PA-C  polyethylene glycol powder (GLYCOLAX /MIRALAX ) 17 GM/SCOOP powder Mix 1 capful (17 g) with water  and drink by mouth daily. 12/19/23   Love, Sharlet RAMAN, PA-C  senna (SENOKOT) 8.6 MG TABS tablet Take 2 tablets (17.2 mg total) by mouth at bedtime. 12/19/23   Love, Sharlet RAMAN, PA-C  sorbitol  70 % SOLN Take 60 mLs by mouth daily as needed for moderate constipation. 12/19/23   Love, Sharlet RAMAN, PA-C  SYSTANE COMPLETE PF  0.6 % SOLN Place 1 drop into both eyes 4 (four) times daily as needed (for dryness).    [provider]  Tamsulosin  HCl (FLOMAX ) 0.4 MG CAPS TAKE 1 CAPSULE (0.4 MG TOTAL) BY MOUTH DAILY. 04/04/12   Harvey Seltzer, MD  thiamine  (VITAMIN B-1) 100 MG tablet Take 1 tablet (100 mg total) by mouth daily. 08/12/23   [name], [name], MD  valsartan (DIOVAN) 80 MG tablet Take 80 mg by mouth at bedtime. 03/19/19   [provider]  VITAMIN D, CHOLECALCIFEROL , PO Take 1 capsule by mouth daily.    [provider]    Allergies: Baclofen, Bacitracin-polymyxin b, Tegretol  [carbamazepine ], Allopurinol, Neomycin, and Sulfa antibiotics    Review of Systems  Constitutional:  Negative for chills, fatigue and fever.  HENT:  Negative for congestion.   Eyes:  Negative for visual disturbance.  Respiratory:  Negative for cough, chest tightness and shortness of breath.   Cardiovascular:  Negative for chest pain.  Gastrointestinal:  Negative for abdominal pain, constipation, diarrhea, nausea and vomiting.  Genitourinary:  Negative for flank pain.  Musculoskeletal:  Positive for neck pain. Negative for back pain and neck stiffness.  Skin:  Negative for rash and wound.  Neurological:  Positive for headaches. Negative for dizziness, facial asymmetry, speech difficulty, weakness, light-headedness and numbness.  Psychiatric/Behavioral:  Negative for confusion (resolved now).   All other systems reviewed and are negative.   Updated Vital Signs BP (!) 145/87   Pulse 96   Temp 98.9 F (37.2 C) (Oral)   Resp 18   Ht 5' 8 (1.727 m)   Wt 88.5 kg   SpO2 100%   BMI 29.65 kg/m   Physical Exam Vitals and nursing note reviewed.  Constitutional:      General: He is not in acute distress.    Appearance: He is well-developed. He is not ill-appearing, toxic-appearing or diaphoretic.  HENT:     Head:      Comments: No crepitance    Nose: No congestion or rhinorrhea.     Mouth/Throat:      Pharynx: No oropharyngeal exudate or posterior oropharyngeal erythema.  Eyes:     Extraocular Movements: Extraocular movements intact.     Conjunctiva/sclera: Conjunctivae normal.     Pupils: Pupils are equal, round, and reactive to light.  Cardiovascular:     Rate and Rhythm: Normal rate and regular rhythm.     Pulses: Normal pulses.     Heart sounds: No murmur heard. Pulmonary:     Effort: Pulmonary effort is normal. No respiratory distress.     Breath sounds: Normal breath sounds. No wheezing, rhonchi or rales.  Chest:     Chest wall: No tenderness.  Abdominal:     General: Abdomen is flat.     Palpations: Abdomen is soft.  Tenderness: There is no abdominal tenderness. There is no guarding or rebound.  Musculoskeletal:        General: Tenderness present. No swelling.     Cervical back: Neck supple. Tenderness present.  Skin:    General: Skin is warm and dry.     Capillary Refill: Capillary refill takes less than 2 seconds.     Findings: No erythema or rash.  Neurological:     General: No focal deficit present.     Mental Status: He is alert.     Sensory: No sensory deficit.     Motor: No weakness.  Psychiatric:        Mood and Affect: Mood normal.     (all labs ordered are listed, but only abnormal results are displayed) Labs Reviewed - No data to display  EKG: None  Radiology: CT Head Wo Contrast Result Date: 01/31/2024 CLINICAL DATA:  Head trauma, minor (Age >= 65y); Neck trauma (Age >= 65y) EXAM: CT HEAD WITHOUT CONTRAST CT CERVICAL SPINE WITHOUT CONTRAST TECHNIQUE: Multidetector CT imaging of the head and cervical spine was performed following the standard protocol without intravenous contrast. Multiplanar CT image reconstructions of the cervical spine were also generated. RADIATION DOSE REDUCTION: This exam was performed according to the departmental dose-optimization program which includes automated exposure control, adjustment of the mA and/or kV according  to patient size and/or use of iterative reconstruction technique. COMPARISON:  CT head 06/21/2021 FINDINGS: CT HEAD FINDINGS Brain: Cerebral ventricle sizes are concordant with the degree of cerebral volume loss. Patchy and confluent areas of decreased attenuation are noted throughout the deep and periventricular white matter of the cerebral hemispheres bilaterally, compatible with chronic microvascular ischemic disease. No evidence of large-territorial acute infarction. No parenchymal hemorrhage. No mass lesion. No extra-axial collection. No mass effect or midline shift. No hydrocephalus. Basilar cisterns are patent. Vascular: No hyperdense vessel. Skull: No acute fracture or focal lesion. Sinuses/Orbits: Right maxillary sinus mucosal thickening. Otherwise paranasal sinuses and mastoid air cells are clear. The orbits are unremarkable. Other: None. CT CERVICAL SPINE FINDINGS Alignment: Grade 1 anterolisthesis of C4 on C5. Skull base and vertebrae: Multilevel severe degenerative changes spine. No severe osseous neural foraminal or central canal stenosis. No acute fracture. No aggressive appearing focal osseous lesion or focal pathologic process. Soft tissues and spinal canal: No prevertebral fluid or swelling. No visible canal hematoma. Upper chest: Unremarkable. Other: None. IMPRESSION: 1. No acute intracranial abnormality. 2. No acute displaced fracture or traumatic listhesis of the cervical spine. Electronically Signed   By: Morgane  Naveau M.D.   On: 01/31/2024 22:11   CT Cervical Spine Wo Contrast Result Date: 01/31/2024 CLINICAL DATA:  Head trauma, minor (Age >= 65y); Neck trauma (Age >= 65y) EXAM: CT HEAD WITHOUT CONTRAST CT CERVICAL SPINE WITHOUT CONTRAST TECHNIQUE: Multidetector CT imaging of the head and cervical spine was performed following the standard protocol without intravenous contrast. Multiplanar CT image reconstructions of the cervical spine were also generated. RADIATION DOSE REDUCTION: This  exam was performed according to the departmental dose-optimization program which includes automated exposure control, adjustment of the mA and/or kV according to patient size and/or use of iterative reconstruction technique. COMPARISON:  CT head 06/21/2021 FINDINGS: CT HEAD FINDINGS Brain: Cerebral ventricle sizes are concordant with the degree of cerebral volume loss. Patchy and confluent areas of decreased attenuation are noted throughout the deep and periventricular white matter of the cerebral hemispheres bilaterally, compatible with chronic microvascular ischemic disease. No evidence of large-territorial acute infarction. No parenchymal hemorrhage.  No mass lesion. No extra-axial collection. No mass effect or midline shift. No hydrocephalus. Basilar cisterns are patent. Vascular: No hyperdense vessel. Skull: No acute fracture or focal lesion. Sinuses/Orbits: Right maxillary sinus mucosal thickening. Otherwise paranasal sinuses and mastoid air cells are clear. The orbits are unremarkable. Other: None. CT CERVICAL SPINE FINDINGS Alignment: Grade 1 anterolisthesis of C4 on C5. Skull base and vertebrae: Multilevel severe degenerative changes spine. No severe osseous neural foraminal or central canal stenosis. No acute fracture. No aggressive appearing focal osseous lesion or focal pathologic process. Soft tissues and spinal canal: No prevertebral fluid or swelling. No visible canal hematoma. Upper chest: Unremarkable. Other: None. IMPRESSION: 1. No acute intracranial abnormality. 2. No acute displaced fracture or traumatic listhesis of the cervical spine. Electronically Signed   By: Morgane  Naveau M.D.   On: 01/31/2024 22:11     Procedures   Medications Ordered in the ED - No data to display                                  Medical Decision Making Amount and/or Complexity of Data Reviewed Radiology: ordered.    Miguel Williams is a 78 y.o. male with a past medical history significant for asthma,  hypertension, CKD, previous bladder cancer, COPD, bipolar disorder, hypothyroidism, insomnia, anxiety, and previous back injuries who presents with a fall.  According to patient, he was using his walker and slipped and fell and hit the back of his head.  He reportedly was dazed and confused and was disoriented briefly and his PCP told him to come in to get CT scans of his head and neck to make sure thing was okay.  He reports he had headache initially but it is now very mild as a 1 out of 10.  He denies any nausea, vomiting, vision changes or speech changes at this time.  Denies any pain in his neck down with no arm or leg pains.  No back pain chest pain or abdominal pain.  No reported neurologic symptoms such as numbness weakness or extremity changes.  On exam, lungs clear.  Chest nontender.  Abdomen tender.  Back nontender.  Upper neck slightly Tender and has a hematoma in the back of his head.  There is no laceration.  He reports that it seems like it is going down.  No reported vision changes.  Pupil symmetric and reactive with normal extraocular movements.  Intact sensation strength and pulses in extremities.  No evidence of facial trauma.  We had a shared decision-making conversation with both himself and his home nurse and we agreed to hold on extensive workup but we will instead focus on getting a CT of his head and neck.  If they are reassuring, anticipate discharge home.  Patient agrees with this plan.  I spectated mild concussion and a postconcussive syndrome lasted for several minutes with his confusion and disorientation and now that has improved, his imaging is reassuring, anticipate discharge.     10:21 PM CT imaging reassuring without evidence of fracture or dislocation or intracranial bleeding.  Suspect mild concussion given the brief mental status changes he had that have resolved.  Patient feels at his baseline now and we agree with plan for discharge home.  Patient will follow-up with  PCP.  They understood return precaution follow-up instructions and was discharged in good condition.      Final diagnoses:  Fall, initial encounter  Injury of  head, initial encounter  Concussion without loss of consciousness, initial encounter    ED Discharge Orders     None      Clinical Impression: 1. Fall, initial encounter   2. Injury of head, initial encounter   3. Concussion without loss of consciousness, initial encounter     Disposition: Discharge  Condition: Good  I have discussed the results, Dx and Tx plan with the pt(& family if present). He/she/they expressed understanding and agree(s) with the plan. Discharge instructions discussed at great length. Strict return precautions discussed and pt &/or family have verbalized understanding of the instructions. No further questions at time of discharge.    New Prescriptions   No medications on file    Follow Up: Shayne Anes, MD [address] [place] [address] [phone]     [facility] Emergency Department at [facility] [address] [place]  [address] [phone]         Ashira Kirsten, Lonni PARAS, MD 01/31/24 2222

## 2024-01-31 NOTE — ED Triage Notes (Signed)
 Patient BIB EMS for evaluation of fall. Patient was walking around home with walker, fell hit back of head. Home health aid reports patient had some confusion after the fall but has since recovered. Patient alert and oriented on arrival. Patient does have Hematoma on back of head. Denies blood thinners

## 2024-01-31 NOTE — Discharge Instructions (Addendum)
 Your history, exam, and evaluation today seem consistent with minor head injury after this fall with a mild concussion given the confusion and mental status changes you briefly had.  Your imaging was reassuring without evidence of acute fracture or dislocation or intracranial bleeding.  We feel you are safe for discharge home.  Please rest and stay hydrated and follow-up with your primary doctor.  If any symptoms change or worsen acutely, please return to the nearest emergency department.  It was a pleasure taking care of you this evening.

## 2024-02-05 ENCOUNTER — Encounter: Payer: Self-pay | Admitting: Physical Medicine and Rehabilitation

## 2024-02-05 ENCOUNTER — Encounter: Attending: Physical Medicine and Rehabilitation | Admitting: Physical Medicine and Rehabilitation

## 2024-02-05 VITALS — BP 117/74 | HR 91 | Ht 67.0 in | Wt 207.0 lb

## 2024-02-05 DIAGNOSIS — M5412 Radiculopathy, cervical region: Secondary | ICD-10-CM | POA: Diagnosis present

## 2024-02-05 DIAGNOSIS — M48062 Spinal stenosis, lumbar region with neurogenic claudication: Secondary | ICD-10-CM | POA: Diagnosis not present

## 2024-02-05 DIAGNOSIS — R413 Other amnesia: Secondary | ICD-10-CM | POA: Diagnosis present

## 2024-02-05 MED ORDER — GABAPENTIN 100 MG PO CAPS: 100.0000 mg | ORAL_CAPSULE | Freq: Every evening | ORAL | Status: AC | PRN

## 2024-02-05 NOTE — Progress Notes (Signed)
 Subjective:    Patient ID: Miguel Williams, male    DOB: 1945-08-28, 78 y.o.   MRN: [id number]  HPI   Miguel Williams is a 78 y.o. year old male  who  has a past medical history of Abdominal pain, lower, Agitation, Allergy, unspecified, initial encounter, Anxiety disorder, Arthritis, Asthma, Bipolar disorder (HCC), Cancer (HCC), Cancer (HCC), Chronic kidney disease, stage III (moderate) (HCC), Chronic kidney disease, stage III (moderate) (HCC), COPD (chronic obstructive pulmonary disease) (HCC), Coronary atherosclerosis of native coronary artery, Esophageal reflux, Fatigue, Fungal granuloma, Hip pain, History of 2019 novel coronavirus disease (COVID-19), History of endoscopy (02/00/2011), History of kidney stones, Hypertension, Hypo-osmolality and hyponatremia, Hypokalemia, Hypothyroidism, 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   They are presenting to PM&R clinic for follow up related to IPR stay following hospitalization for perispinal hematoma I&D following  PLIF L4/5 and L5/S1 on 723/25 with Dr [name] .  Plan from last visit: Discussed the use of AI scribe software for clinical note transcription with the patient, who gave verbal consent to proceed.  History of Present Illness   Miguel Williams is a 78 year old male who presents for follow-up after a fall and inpatient rehab stay.  He is following up after an inpatient rehab stay for neurogenic claudication following L4-S1 posterior  lumbar interbody fusion. Post-surgery, he developed a large posterior paraspinal fluid collection. An MRI showed moderate bilateral foraminal stenosis at L5-S1.  He experienced a fall on October 8th at home, resulting in a suspected mild concussion with a brief mental status change. A CT scan of the head and neck showed no fracture, dislocation, or internal bleeding. The fall occurred when he let go of his walker while trying to dispose of an apple core, hitting his head on the right side. Since then, he has had no headaches or dizziness. He had another fall in the bathroom without injury.  He is making progress in mobility, using a walker, and working with physical therapy on strength, balance, and movement. He has difficulty with safety strategies, often forgetting to use his walker properly. Home adaptations include changing bed sheets to cotton, using a porta-potty, and installing a shower seat.   There is improvement in burning pain in his feet, previously managed with gabapentin . Current medications include gabapentin  100 mg three times a day, Cymbalta  60 mg twice a day, and Klonopin  0.5 mg at bedtime as needed. He has discontinued Lyrica  and dantrolene . He also takes supplements for nerve recovery. Family members have noted some agitation and memory concerns. No alcohol  use since returning home.      Nurse reports worsening cognition and outpbursts of anger and frustration. Patient endorses prior neuropsych eval last year. Dr Tat with recent negative Parkinson's workup.       Pain Inventory Average Pain 3 Pain Right Now 2 My pain is intermittent and dull  In the last 24 hours, has pain interfered with the following? General activity 3 Relation with others 0 Enjoyment of life 2 What TIME of day is your pain at its worst? evening Sleep (in general) Good  Pain is worse with: standing Pain improves with:  rest, therapy/exercise, and medication Relief from Meds: 8  Family History   Problem Relation Age of Onset   Multiple myeloma Mother    Lung cancer Father    Social History   Socioeconomic History   Marital status: Single    Spouse name: Not on file   Number of children: 0   Years of education: college   Highest education level: Not on file  Occupational History    Employer: [employer]   Occupation: retired    Comment: Counsellor  Tobacco Use   Smoking status: Never   Smokeless tobacco: Never  Vaping Use   Vaping status: Never Used  Substance and Sexual Activity   Alcohol  use: Not Currently    Comment: 3oz. of wine every other day as of 10/2023   Drug use: Not Currently   Sexual activity: Not Currently    Partners: Female  Other Topics Concern   Not on file  Social History Narrative   Left handed    Social Drivers of Health   Financial Resource Strain: Not on file  Food Insecurity: No Food Insecurity (12/01/2023)   Hunger Vital Sign    Worried About Running Out of Food in the Last Year: Never true    Ran Out of Food in the Last Year: Never true  Transportation Needs: No Transportation Needs (12/01/2023)   PRAPARE - Administrator, Civil Service (Medical): No    Lack of Transportation (Non-Medical): No  Physical Activity: Not on file  Stress: Not on file  Social Connections: Unknown (12/01/2023)   Social Connection and Isolation Panel    Frequency of Communication with Friends and Family: Three times a week    Frequency of Social Gatherings with Friends and Family: Three times a week    Attends Religious Services: More than 4 times per year    Active Member of Clubs or Organizations: Yes    Attends Banker Meetings: More than 4 times per year    Marital Status: Patient declined   Past Surgical History:  Procedure Laterality Date   CATARACT EXTRACTION W/ INTRAOCULAR LENS IMPLANT Bilateral    COLONOSCOPY  04/2023   COLONOSCOPY W/ ENDOSCOPIC US      CYSTOSCOPY WITH FULGERATION N/A 03/09/2023    Procedure: CYSTOSCOPY WITH FULGERATION WITH CLOT EVACUATION;  Surgeon: Devere Lonni Righter, MD;  Location: [facility];  Service: Urology;  Laterality: N/A;   CYSTOSCOPY WITH URETHRAL DILATATION N/A 03/08/2023   Procedure: CYSTOSCOPY;  Surgeon: Devere Lonni Righter, MD;  Location: [facility];  Service: Urology;  Laterality: N/A;  30 MINUTES   ESOPHAGOGASTRODUODENOSCOPY     LUMBAR WOUND DEBRIDEMENT N/A 12/01/2023   Procedure: LUMBAR WOUND DEBRIDEMENT;  Surgeon: [name], MD;  Location: [facility] OR;  Service: Neurosurgery;  Laterality: N/A;   TONSILLECTOMY  04/25/1950   Past Surgical History:  Procedure Laterality Date   CATARACT EXTRACTION W/ INTRAOCULAR LENS IMPLANT Bilateral    COLONOSCOPY  04/2023   COLONOSCOPY W/ ENDOSCOPIC US      CYSTOSCOPY WITH FULGERATION N/A 03/09/2023   Procedure: CYSTOSCOPY WITH FULGERATION WITH CLOT EVACUATION;  Surgeon: Devere Lonni Righter, MD;  Location: [facility];  Service: Urology;  Laterality: N/A;   CYSTOSCOPY WITH URETHRAL DILATATION N/A 03/08/2023   Procedure: CYSTOSCOPY;  Surgeon: Devere Lonni Righter, MD;  Location: [facility];  Service: Urology;  Laterality: N/A;  30 MINUTES   ESOPHAGOGASTRODUODENOSCOPY     LUMBAR WOUND DEBRIDEMENT N/A 12/01/2023   Procedure: LUMBAR WOUND DEBRIDEMENT;  Surgeon: [name], MD;  Location: [facility] OR;  Service: Neurosurgery;  Laterality: N/A;   TONSILLECTOMY  04/25/1950   Past Medical History:  Diagnosis Date   Abdominal pain, lower    Agitation    Allergy, unspecified, initial encounter    Anxiety disorder    Arthritis    Asthma    As a child   Bipolar disorder [facility])    Patient denies   Cancer [facility])    Bladder   Cancer (HCC)    Skin cancer   Chronic kidney disease, stage III (moderate) (HCC)    Chronic kidney disease, stage III (moderate) (HCC)    COPD (chronic obstructive pulmonary disease) (HCC)    Pt denies (10/2023)   Coronary atherosclerosis of native coronary artery     Esophageal reflux    Fatigue    Fungal granuloma    Hip pain    History of 2019 novel coronavirus disease (COVID-19)    History of endoscopy 02/00/2011   History of kidney stones    Hypertension    Hypo-osmolality and hyponatremia    Hypokalemia    Hypothyroidism    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 now has 3oz of wine every other day (as of 10/2023)   Umbilical hernia    Unspecified fall, subsequent encounter    Vitamin D deficiency, unspecified    Wernicke's encephalopathy 06/23/2019   Ht 5' 7 (1.702 m)   Wt 207 lb (93.9 kg)   BMI 32.42 kg/m   Opioid Risk Score:   Fall Risk Score:  `1  Depression screen PHQ 2/9     02/05/2024    2:00 PM 01/02/2024    2:40 PM  Depression screen PHQ 2/9  Decreased Interest 0 0  Down, Depressed, Hopeless  0  PHQ - 2 Score 0 0  Altered sleeping  0  Tired, decreased energy  0  Change in appetite  0  Feeling bad or failure about yourself   0  Trouble concentrating  0  Moving slowly or fidgety/restless  0  Suicidal thoughts  0  PHQ-9 Score  0    Review of Systems  Musculoskeletal:        Pain in the lower legs down to both feet  All other systems reviewed and are negative.      Objective:   Physical Exam   Constitutional: No apparent distress. Appropriate appearance for age.  HENT: No JVD. Neck Supple. Trachea midline. Atraumatic, normocephalic. Eyes: PERRLA. EOMI. Visual fields grossly intact.  Cardiovascular: RRR, no murmurs/rub/gallops. No Edema.  Peripheral pulses 2+  Respiratory: CTAB. No rales, rhonchi, or wheezing. On RA.  Abdomen: + bowel sounds, normoactive. No distention or tenderness.  Skin: C/D/I. No apparent lesions. MSK:      No apparent deformity.       Neurologic exam:  Cognition: AAO to person, place, time and event.  Language: Fluent, No substitutions or neoglisms. No dysarthria.  Names 3/3 objects correctly.  Memory: Moderate deficits Insight: Fair insight into current condition.  Mood: Pleasant affect, appropriate mood.  Sensation: To light touch intact in BL UEs and LEs  Reflexes: Hyperreflexic BL LEs CN: 2-12 grossly intact.  Coordination: No apparent tremors. No ataxia on FTN, HTS bilaterally.  Spasticity: MAS 0 in all extremities.       Strength: RLE 4/5 throughout; LLE 5-/5 throughout Gait: RW with bent bilateral legs; R hip drop > L; slow advance and low clearance.         Assessment & Plan:   Desmen Schoffstall is a 78 y.o. year old male  who  has a past medical history of Abdominal pain, lower, Agitation, Allergy, unspecified, initial encounter, Anxiety disorder, Arthritis, Asthma, Bipolar disorder (HCC), Cancer (HCC), Cancer (HCC), Chronic kidney disease, stage III (moderate) (HCC), Chronic kidney disease, stage III (moderate) (HCC), COPD (chronic obstructive pulmonary disease) (HCC), Coronary atherosclerosis of native coronary artery, Esophageal reflux, Fatigue, Fungal granuloma, Hip pain, History of 2019 novel coronavirus disease (COVID-19), History of endoscopy (02/00/2011), History of kidney stones, Hypertension, Hypo-osmolality and hyponatremia, Hypokalemia, Hypothyroidism, 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   They are presenting to PM&R clinic for follow up related to IPR stay following hospitalization for perispinal hematoma I&D following  PLIF L4/5 and L5/S1 on 723/25 with Dr [name] .  Assessment and Plan    Neurogenic claudication status post L4-S1 PLIF with paraspinal fluid collection and lumbar foraminal stenosis Status post L4-S1 PLIF with large posterior paraspinal fluid collection and moderate bilateral foraminal stenosis at L5-S1. Mobility improved with walker. Informed of 2% complication risk, experienced post-surgical outcomes. - Continue follow-up with neurosurgery as needed. - Continue home health services. - Referral to outpatient neuro rehab for PT and OT.  Gait instability and recurrent falls with history of mild concussion Recent mild concussion resolved without sequelae. Gait instability noted, focus on safety and balance in PT. Home adaptations made for safety. - Continue physical therapy focusing on strength, balance, and safety. - Emphasize use of walker and safety strategies. - Referral to outpatient neuro rehab for PT and OT.  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 Diarrhea managed with Imodium . - Use Imodium  as needed for diarrhea.  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 Gabapentin  reduced, potential to discontinue. Cymbalta  continued. No need for interventional procedures post-surgery. - Reduce gabapentin  to 100 mg at night, then discontinue if no issues. - Continue Cymbalta  60 mg twice a day. - Monitor for any resurgence of neuropathic pain.  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 No current symptoms. - Consider referral to neurosurgery for epidural steroid injections if symptoms develop.  Cognitive impairment, unspecified Cognitive issues with agitation and memory concerns. Previous neuropsychological testing mentioned, results unclear. - Obtain records of previous neuropsychological testing. - Consider referral for updated neuropsychological evaluation if needed.

## 2024-02-05 NOTE — Patient Instructions (Addendum)
  VISIT SUMMARY: You had a follow-up visit after your recent fall and inpatient rehab stay. We discussed your progress with mobility, pain management, and safety strategies at home. We also reviewed your medications and addressed concerns about cognitive changes.  YOUR PLAN: NEUROGENIC CLAUDICATION STATUS POST L4-S1 PLIF WITH PARASPINAL FLUID COLLECTION AND LUMBAR FORAMINAL STENOSIS: You had surgery on your lower back and developed a fluid collection and narrowing of spaces in your spine. Your mobility has improved with the use of a walker. -Continue follow-up with neurosurgery as needed. -Continue home health services. -Referral to outpatient neuro rehab for physical and occupational therapy.  GAIT INSTABILITY AND RECURRENT FALLS WITH HISTORY OF MILD CONCUSSION: You have had some falls recently, including a mild concussion that has since resolved. Your balance and safety are being addressed in physical therapy. -Continue physical therapy focusing on strength, balance, and safety. -Emphasize the use of your walker and safety strategies. -Referral to outpatient neuro rehab for physical and occupational therapy.  NEUROGENIC BLADDER AND BOWEL DYSFUNCTION AFTER SPINAL SURGERY: You have some bladder and bowel control issues following your spinal surgery. -Use Imodium  as needed for diarrhea.  CHRONIC NEUROPATHIC PAIN, IMPROVING: You have chronic nerve pain in your legs that is getting better. Your medication has been adjusted. -Reduce gabapentin  to 100 mg at night as needed, then discontinue if no issues. -Continue Cymbalta  60 mg twice a day. -Monitor for any resurgence of neuropathic pain.  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 -Consider referral to neurosurgery for epidural steroid injections if symptoms develop.  COGNITIVE IMPAIRMENT, UNSPECIFIED:  You have some cognitive issues, including agitation and memory concerns. -Obtain records of previous neuropsychological testing. -Consider referral for updated neuropsychological evaluation if needed.   Contains text generated by [employer].                                 Contains text generated by [employer]bridge.

## 2024-02-12 ENCOUNTER — Ambulatory Visit: Admitting: Occupational Therapy

## 2024-02-13 ENCOUNTER — Other Ambulatory Visit: Payer: Self-pay

## 2024-02-13 ENCOUNTER — Ambulatory Visit: Attending: Physical Medicine and Rehabilitation | Admitting: Occupational Therapy

## 2024-02-13 DIAGNOSIS — R2689 Other abnormalities of gait and mobility: Secondary | ICD-10-CM | POA: Diagnosis present

## 2024-02-13 DIAGNOSIS — M6281 Muscle weakness (generalized): Secondary | ICD-10-CM | POA: Insufficient documentation

## 2024-02-13 DIAGNOSIS — R293 Abnormal posture: Secondary | ICD-10-CM | POA: Diagnosis present

## 2024-02-13 DIAGNOSIS — M48062 Spinal stenosis, lumbar region with neurogenic claudication: Secondary | ICD-10-CM | POA: Diagnosis not present

## 2024-02-13 DIAGNOSIS — R41840 Attention and concentration deficit: Secondary | ICD-10-CM | POA: Insufficient documentation

## 2024-02-13 DIAGNOSIS — R2681 Unsteadiness on feet: Secondary | ICD-10-CM | POA: Diagnosis present

## 2024-02-13 NOTE — Therapy (Signed)
 OUTPATIENT OCCUPATIONAL THERAPY NEURO EVALUATION  Patient Name: Miguel Williams MRN: [id number] DOB:16-Mar-1946, 78 y.o., male Today's Date: 02/13/2024  PCP: Shayne Anes, MD REFERRING PROVIDER: Emeline Joesph BROCKS, DO  END OF SESSION:  OT End of Session - 02/13/24 1625     Visit Number 1    Number of Visits 7    Date for Recertification  03/29/24    Authorization Type [employer] 2025    Authorization Time Period auth requested    OT Start Time 1452    OT Stop Time 1540    OT Time Calculation (min) 48 min          Past Medical History:  Diagnosis Date   Abdominal pain, lower    Agitation    Allergy, unspecified, initial encounter    Anxiety disorder    Arthritis    Asthma    As a child   Bipolar disorder (HCC)    Patient denies   Cancer (HCC)    Bladder   Cancer (HCC)    Skin cancer   Chronic kidney disease, stage III (moderate) (HCC)    Chronic kidney disease, stage III (moderate) (HCC)    COPD (chronic obstructive pulmonary disease) (HCC)    Pt denies (10/2023)   Coronary atherosclerosis of native coronary artery    Esophageal reflux    Fatigue    Fungal granuloma    Hip pain    History of 2019 novel coronavirus disease (COVID-19)    History of endoscopy 02/00/2011   History of kidney stones    Hypertension    Hypo-osmolality and hyponatremia    Hypokalemia    Hypothyroidism    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 now has 3oz of wine every other day (as of 10/2023)   Umbilical hernia     Unspecified fall, subsequent encounter    Vitamin D deficiency, unspecified    Wernicke's encephalopathy 06/23/2019   Past Surgical History:  Procedure Laterality Date   CATARACT EXTRACTION W/ INTRAOCULAR LENS IMPLANT Bilateral    COLONOSCOPY  04/2023   COLONOSCOPY W/ ENDOSCOPIC US      CYSTOSCOPY WITH FULGERATION N/A 03/09/2023   Procedure: CYSTOSCOPY WITH FULGERATION WITH CLOT EVACUATION;  Surgeon: Devere Lonni Righter, MD;  Location: [facility];  Service: Urology;  Laterality: N/A;   CYSTOSCOPY WITH URETHRAL DILATATION N/A 03/08/2023   Procedure: CYSTOSCOPY;  Surgeon: Devere Lonni Righter, MD;  Location: [facility];  Service: Urology;  Laterality: N/A;  30 MINUTES   ESOPHAGOGASTRODUODENOSCOPY     LUMBAR WOUND DEBRIDEMENT N/A 12/01/2023   Procedure: LUMBAR WOUND DEBRIDEMENT;  Surgeon: [name], MD;  Location: [facility] OR;  Service: Neurosurgery;  Laterality: N/A;   TONSILLECTOMY  04/25/1950   Patient Active Problem List   Diagnosis Date Noted   Right cervical radiculopathy 02/05/2024   Constipation 12/20/2023   Acute on chronic back pain 12/20/2023   Neuropathy 12/20/2023   Depression with anxiety 12/08/2023  Neurogenic claudication due to lumbar spinal stenosis 12/05/2023   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   Lumbar epidural mass (HCC) 12/01/2023   Spinal stenosis of lumbar region 11/15/2023   Family history of alcoholism 11/06/2023   Headache, occipital 11/06/2023   History of pancreatitis 11/06/2023   Hyperparathyroidism 11/06/2023   Hypertension 11/06/2023   Joint swelling 11/06/2023   Joint stiffness 11/06/2023   Laryngopharyngeal reflux 11/06/2023   Lumbar polyradiculopathy 11/06/2023   Memory loss 11/06/2023   Anemia, blood loss 11/06/2023   Symptomatic anemia 08/10/2023   Anemia 08/09/2023   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 11/27/2022   Urinary tract infection without hematuria 11/09/2022   Bladder tumor 09/12/2022   Nausea and  vomiting 09/12/2022   Kidney stones 12/11/2021   Scalp laceration 07/01/2021   Wernicke's encephalopathy 06/23/2019   Chronic kidney disease, stage III (moderate) (HCC)    Hyperkalemia 04/12/2019   Chronic kidney disease due to hypertension 04/12/2019   Stage 3a chronic kidney disease (HCC) 04/12/2019   Alcohol  use, unspecified with unspecified alcohol -induced disorder 04/12/2019   Pyelonephritis 04/05/2019   Renal failure (ARF), acute on chronic 04/05/2019   Ureterolithiasis 04/05/2019   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   Presbycusis of both ears 05/27/2016   Hardening of the aorta (main artery of the heart) 02/15/2016   Neck pain 01/15/2016   Tremor 12/18/2015   Hearing loss of left ear 06/04/2015   Other specified diseases of blood and blood-forming organs 07/28/2014   Long term current use of therapeutic drug 09/09/2013   Hyperlipidemia 04/03/2013   Hemorrhoids 02/18/2013   History of infectious disease 02/18/2013   Gout 02/18/2013   Pain, dental 01/01/2013   Health maintenance examination 03/30/2011   Abnormal prostate exam 03/08/2011   Abnormal prostate specific antigen 03/08/2011   GASTROESOPHAGEAL REFLUX DISEASE, CHRONIC 12/24/2008   ACUTE BRONCHITIS 12/16/2008   Asthma with exacerbation 12/16/2008   GANGLION CYST, TENDON SHEATH 06/27/2008   FOOT PAIN, LEFT 05/23/2008   ADVERSE DRUG REACTION 03/10/2008   GOUT 03/08/2007   BPH/LUTS W/O OBSTRUCTION 03/08/2007   ELEVATED BP W/O HYPERTENSION 03/08/2007   INSOMNIA, CHRONIC, MILD 10/02/2006   CERUMEN IMPACTION, BILATERAL 10/02/2006    ONSET DATE: 11/15/23  REFERRING DIAG: F51.937 (ICD-10-CM) - Neurogenic claudication due to lumbar spinal stenosis  THERAPY DIAG:  Muscle weakness (generalized)  Attention and concentration deficit  Rationale for Evaluation and Treatment: Rehabilitation  SUBJECTIVE:   SUBJECTIVE STATEMENT: Pt reports 2 years of back pain which was getting worse, tried PT and injections in lower  back with temporary improvements.  Pt underwent surgery 11/15/23 and fell a couple times with EMS called and was transferred back to the hospital.  Pt went back to the hospital 11/30/23 and was transferred to [facility] and received OT, PT, and ST until 12/20/23.  Pt reports his worst fall was 01/31/24 when he fell and hit his head on the desk and had a concussion.   Pt accompanied by: self and nurse, [name]  PERTINENT HISTORY: presented 11/30/23 with low back pain and weakness along with several falls since his elective PLIF L4-S1 with decompressive laminectomies on 11/15/23. Workup has revealed large what looks like epidural hematoma and wound hematoma. S/p reexploration of lumbar wound for evacuation of hematoma 8/8. PMH of anxiety, cancer, CKD III, HLD, HTN, hypothyroidism  PRECAUTIONS: Back  WEIGHT BEARING RESTRICTIONS: No  PAIN:  Are you having pain? No  FALLS: Has patient fallen in last 6 months? Yes. Number of falls 4-6  LIVING ENVIRONMENT:  Lives with: lives alone - has a Engineer, civil (consulting) who is staying there 24/7 Lives in: Other townhouse Stairs: 2 steps to enter with rails on B sides that he can reach, 2 story home but is able to remain on main floor with full bedroom/bathroom Has following equipment at home: Single point cane, Walker - 2 wheeled, shower chair, bed side commode, Grab bars, and hand held shower head, transport wheelchair  PLOF: Independent and Independent with basic ADLs  PATIENT GOALS: walking better, to move to [place]  to live near sisters  OBJECTIVE:  Note: Objective measures were completed at Evaluation unless otherwise noted.  HAND DOMINANCE: Left  ADLs: Transfers/ambulation related to ADLs: using RW with all mobility Eating: typically will order door dash for dinner Grooming: Mod I UB Dressing: Mod I LB Dressing: Mod I Toileting: Mod I Bathing: Mod I Tub Shower transfers: Mod I with grab bar Equipment: Grab bars, Walk in shower, bed side commode, Reacher, and Long  handled sponge  IADLs: Light housekeeping: nurse is completing all Meal Prep: orders out, nurse will cook, will fix his own coffee in [employer] mobility: no driving Medication management: nurse fills his pillboxes   MOBILITY STATUS: Hx of falls; min cues to remain within RW when ambulating  POSTURE COMMENTS:  rounded shoulders and forward head  ACTIVITY TOLERANCE: Activity tolerance: decreased endurance and balance  FUNCTIONAL OUTCOME MEASURES: PSFS: 2.3   UPPER EXTREMITY ROM:  WFL   UPPER EXTREMITY MMT:   5/5  COGNITION: Overall cognitive status: Impaired; pt and nurse/caregiver reporting decreased memory and sequencing                                                                                                                             TREATMENT DATE:  02/13/24 Educated on role and purpose of OT with focus on goal setting in regards to increasing safety and independence in the home and progressing away from caregiver/nurse.  Pt initially stating that all he needed to work on was his balance, but with further explanation and phrasing, pt and nurse/caregiver able to provide functional tasks that can be focused on during therapy sessions.      PATIENT EDUCATION: Education details: Educated on role and purpose of OT as well as potential interventions and goals for therapy based on initial evaluation findings. Person educated: Patient and Corporate treasurer Education method: Explanation Education comprehension: verbalized understanding and needs further education  HOME EXERCISE PROGRAM: TBD   GOALS: Goals reviewed with patient? Yes  SHORT TERM GOALS: Target date: 03/08/24  Pt will verbalize understanding of task modifications and/or potential A/E needs to increase ease, safety, and independence w/ ADLs and IADLs Baseline: nurse/caregiver completing all IADLs Goal status: INITIAL  2.  Pt will verbalize understanding of energy conservation techniques to  increase independence and safety with IADLs. Baseline:  Goal status: INITIAL  3.  Pt will perform dynamic standing task for simple IADLS w/o LOB using DME and/or countertop support  prn Baseline:  Goal status: INITIAL   LONG TERM GOALS: Target date: 03/29/24  Pt will complete pill box assessment with 1 or fewer errors for increased awareness with medication management. Baseline: nurse/caregiver completing managing meds Goal status: INITIAL  2.  Pt will demonstrate improved standing balance, tolerance, and awareness to complete laundry task at Mod I level. Baseline:  Goal status: INITIAL  3.  Pt will verbalize understanding of safe strategies and/or modifications to setup to increase safety with picking up items from ground. Baseline:  Goal status: INITIAL  4.  Patient will report at least two-point increase in average PSFS score or at least three-point increase in a single activity score indicating functionally significant improvement given minimum detectable change. Baseline: 2.3 Goal status: INITIAL   ASSESSMENT:  CLINICAL IMPRESSION: Patient is a 78 y.o. male who was seen today for occupational therapy evaluation for cognitive, endurance, and balance deficits s/p frequent falls with concussion and spinal stenosis.  Pt currently has a nurse residing with him around the clock who is providing supervision/setup for ADLs and completing IADLs for pt.  Pt demonstrating decreased memory, sequencing, and safety awareness impacting independence and safety in home alone. Pt will benefit from skilled occupational therapy services to address balance, cognition, safety awareness, and introduction of compensatory strategies/AE prn to improve participation and safety during ADLs and IADLs.    PERFORMANCE DEFICITS: in functional skills including IADLs, flexibility, mobility, balance, body mechanics, endurance, decreased knowledge of precautions, and decreased knowledge of use of DME, cognitive  skills including memory, safety awareness, and sequencing, and psychosocial skills including routines and behaviors.   IMPAIRMENTS: are limiting patient from ADLs and IADLs.   CO-MORBIDITIES: may have co-morbidities  that affects occupational performance. Patient will benefit from skilled OT to address above impairments and improve overall function.  MODIFICATION OR ASSISTANCE TO COMPLETE EVALUATION: No modification of tasks or assist necessary to complete an evaluation.  OT OCCUPATIONAL PROFILE AND HISTORY: Problem focused assessment: Including review of records relating to presenting problem.  CLINICAL DECISION MAKING: LOW - limited treatment options, no task modification necessary  REHAB POTENTIAL: Good  EVALUATION COMPLEXITY: Low    PLAN:  OT FREQUENCY: 1x/week  OT DURATION: 6 weeks  PLANNED INTERVENTIONS: 97168 OT Re-evaluation, 97535 self care/ADL training, 02889 therapeutic exercise, 97530 therapeutic activity, 97112 neuromuscular re-education, balance training, functional mobility training, psychosocial skills training, energy conservation, coping strategies training, patient/family education, and DME and/or AE instructions  RECOMMENDED OTHER SERVICES: PT  CONSULTED AND AGREED WITH PLAN OF CARE: Patient and nurse/caregiver  PLAN FOR NEXT SESSION: complete pill box assessment, further educate on adaptive strategies for IADLs and setup of meds   KAYLENE DOMINO, OTR/L 02/13/2024, 4:26 PM  [facility] at [facility] [address], [address] [place], [place] [address] Phone # [phone] Fax # [phone]

## 2024-02-16 ENCOUNTER — Ambulatory Visit: Admitting: Physical Therapy

## 2024-02-16 ENCOUNTER — Other Ambulatory Visit: Payer: Self-pay

## 2024-02-16 ENCOUNTER — Encounter: Payer: Self-pay | Admitting: Physical Therapy

## 2024-02-16 DIAGNOSIS — M6281 Muscle weakness (generalized): Secondary | ICD-10-CM

## 2024-02-16 DIAGNOSIS — R2689 Other abnormalities of gait and mobility: Secondary | ICD-10-CM

## 2024-02-16 DIAGNOSIS — R2681 Unsteadiness on feet: Secondary | ICD-10-CM

## 2024-02-16 DIAGNOSIS — R293 Abnormal posture: Secondary | ICD-10-CM

## 2024-02-16 NOTE — Therapy (Signed)
 OUTPATIENT PHYSICAL THERAPY NEURO EVALUATION   Patient Name: Miguel Williams MRN: [id number] DOB:Feb 20, 1946, 78 y.o., male Today's Date: 02/16/2024   PCP: Shayne Anes, MD REFERRING PROVIDER: Emeline Joesph BROCKS, DO   END OF SESSION:  PT End of Session - 02/16/24 1206     Visit Number 1    Number of Visits 17    Date for Recertification  04/12/24    Authorization Type [employer] Medicare    PT Start Time 1024    PT Stop Time 1102    PT Time Calculation (min) 38 min    Equipment Utilized During Treatment Gait belt    Activity Tolerance Patient tolerated treatment well    Behavior During Therapy WFL for tasks assessed/performed          Past Medical History:  Diagnosis Date   Abdominal pain, lower    Agitation    Allergy, unspecified, initial encounter    Anxiety disorder    Arthritis    Asthma    As a child   Bipolar disorder (HCC)    Patient denies   Cancer (HCC)    Bladder   Cancer (HCC)    Skin cancer   Chronic kidney disease, stage III (moderate) (HCC)    Chronic kidney disease, stage III (moderate) (HCC)    COPD (chronic obstructive pulmonary disease) (HCC)    Pt denies (10/2023)   Coronary atherosclerosis of native coronary artery    Esophageal reflux    Fatigue    Fungal granuloma    Hip pain    History of 2019 novel coronavirus disease (COVID-19)    History of endoscopy 02/00/2011   History of kidney stones    Hypertension    Hypo-osmolality and hyponatremia    Hypokalemia    Hypothyroidism    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 now has 3oz of wine every other day (as of 10/2023)   Umbilical hernia    Unspecified fall, subsequent encounter    Vitamin D deficiency, unspecified    Wernicke's encephalopathy 06/23/2019   Past Surgical History:  Procedure Laterality Date   CATARACT EXTRACTION W/ INTRAOCULAR LENS IMPLANT Bilateral    COLONOSCOPY  04/2023   COLONOSCOPY W/ ENDOSCOPIC US      CYSTOSCOPY WITH FULGERATION N/A 03/09/2023   Procedure: CYSTOSCOPY WITH FULGERATION WITH CLOT EVACUATION;  Surgeon: Devere Lonni Righter, MD;  Location: [facility];  Service: Urology;  Laterality: N/A;   CYSTOSCOPY WITH URETHRAL DILATATION N/A 03/08/2023   Procedure: CYSTOSCOPY;  Surgeon: Devere Lonni Righter, MD;  Location: [facility];  Service: Urology;  Laterality: N/A;  30 MINUTES   ESOPHAGOGASTRODUODENOSCOPY     LUMBAR WOUND DEBRIDEMENT N/A 12/01/2023   Procedure: LUMBAR WOUND DEBRIDEMENT;  Surgeon: [name], MD;  Location: [facility];  Service: Neurosurgery;  Laterality: N/A;   TONSILLECTOMY  04/25/1950   Patient Active Problem List   Diagnosis Date Noted   Right cervical radiculopathy 02/05/2024  Constipation 12/20/2023   Acute on chronic back pain 12/20/2023   Neuropathy 12/20/2023   Depression with anxiety 12/08/2023   Neurogenic claudication due to lumbar spinal stenosis 12/05/2023   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   Lumbar epidural mass (HCC) 12/01/2023   Spinal stenosis of lumbar region 11/15/2023   Family history of alcoholism 11/06/2023   Headache, occipital 11/06/2023   History of pancreatitis 11/06/2023   Hyperparathyroidism 11/06/2023   Hypertension 11/06/2023   Joint swelling 11/06/2023   Joint stiffness 11/06/2023   Laryngopharyngeal reflux 11/06/2023   Lumbar polyradiculopathy 11/06/2023   Memory loss 11/06/2023   Anemia, blood loss 11/06/2023   Symptomatic anemia 08/10/2023   Anemia 08/09/2023   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 11/27/2022   Urinary tract infection without hematuria 11/09/2022   Bladder tumor 09/12/2022   Nausea and vomiting 09/12/2022   Kidney stones 12/11/2021   Scalp laceration 07/01/2021   Wernicke's encephalopathy 06/23/2019   Chronic kidney disease, stage III (moderate) (HCC)    Hyperkalemia 04/12/2019   Chronic kidney disease due to hypertension 04/12/2019   Stage 3a chronic kidney disease (HCC) 04/12/2019   Alcohol  use, unspecified with unspecified alcohol -induced disorder 04/12/2019   Pyelonephritis 04/05/2019   Renal failure (ARF), acute on chronic 04/05/2019   Ureterolithiasis 04/05/2019   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   Presbycusis of both ears 05/27/2016   Hardening of the aorta (main artery of the heart) 02/15/2016   Neck pain 01/15/2016   Tremor 12/18/2015   Hearing loss of left ear 06/04/2015   Other specified diseases of blood and blood-forming organs 07/28/2014   Long term current use of therapeutic drug 09/09/2013   Hyperlipidemia 04/03/2013   Hemorrhoids 02/18/2013   History of infectious disease 02/18/2013   Gout 02/18/2013   Pain, dental 01/01/2013   Health maintenance examination 03/30/2011   Abnormal prostate exam 03/08/2011   Abnormal prostate specific antigen 03/08/2011   GASTROESOPHAGEAL REFLUX DISEASE, CHRONIC 12/24/2008   ACUTE BRONCHITIS 12/16/2008   Asthma with exacerbation 12/16/2008   GANGLION CYST, TENDON SHEATH 06/27/2008   FOOT PAIN, LEFT 05/23/2008   ADVERSE DRUG REACTION 03/10/2008   GOUT 03/08/2007   BPH/LUTS W/O OBSTRUCTION 03/08/2007   ELEVATED BP W/O HYPERTENSION 03/08/2007   INSOMNIA, CHRONIC, MILD 10/02/2006   CERUMEN IMPACTION, BILATERAL 10/02/2006    ONSET DATE: 11/15/2023  REFERRING DIAG: F51.937 (ICD-10-CM) - Neurogenic claudication due to lumbar spinal stenosis   THERAPY DIAG:  Muscle weakness (generalized)  Unsteadiness on feet  Other abnormalities of gait and mobility  Abnormal posture  Rationale  for Evaluation and Treatment: Rehabilitation  SUBJECTIVE:  SUBJECTIVE STATEMENT: Pt reports 2 years of back pain which was getting worse, tried PT and injections in lower back with temporary improvements. Pt underwent surgery 11/15/23 and fell a couple times with EMS called and was transferred back to the hospital. Pt went back to the hospital 11/30/23 and was transferred to [facility] and received OT, PT, and ST until 12/20/23. Pt reports his worst fall was 01/31/24 when he fell and hit his head on the desk and had a concussion. Balance and walking are still problematic and want to improve this. Pt accompanied by: Nurse  PERTINENT HISTORY: presented 11/30/23 with low back pain and weakness along with several falls since his elective PLIF L4-S1 with decompressive laminectomies on 11/15/23. Workup has revealed large what looks like epidural hematoma and wound hematoma. S/p reexploration of lumbar wound for evacuation of hematoma 8/8. PMH of anxiety, cancer, CKD III, HLD, HTN, hypothyroidism, neuropathy   PAIN:  Are you having pain? No  PRECAUTIONS: Back and Fall  RED FLAGS: None   WEIGHT BEARING RESTRICTIONS: No  FALLS: Has patient fallen in last 6 months? Yes. Number of falls 5 *Last two falls were without walker LIVING ENVIRONMENT: Lives with: lives alone and has a nurse who is staying in home 24/7 Lives in: Other townhouse Stairs: 2 steps to enter with rails on B sides that he can reach, 2 story home but is able to remain on main floor with full bedroom/bathroom  Has following equipment at home: Single point cane, Walker - 2 wheeled, shower chair, bed side commode, Grab bars, and hand held shower head, transport wheelchair   PLOF: Independent and Independent with basic ADLs  PATIENT GOALS: Balance and  walking  OBJECTIVE:  Note: Objective measures were completed at Evaluation unless otherwise noted.  DIAGNOSTIC FINDINGS: CT 01/31/24-no abnormality  COGNITION: Overall cognitive status: Within functional limits for tasks assessed   SENSATION: Light touch: Impaired  and diminished sensation RLE  POSTURE: rounded shoulders, forward head, and posterior pelvic tilt In standing, knees crouched shaky in unsupported standing  LOWER EXTREMITY ROM:   pt slumps into posterior pelvic tilt with ROM  Active  Right Eval Left Eval  Hip flexion    Hip extension    Hip abduction    Hip adduction    Hip internal rotation    Hip external rotation    Knee flexion    Knee extension -10 -20  Ankle dorsiflexion 10 15  Ankle plantarflexion    Ankle inversion    Ankle eversion     (Blank rows = not tested)  LOWER EXTREMITY MMT:    MMT Right Eval Left Eval  Hip flexion 4 5  Hip extension    Hip abduction 4 4  Hip adduction 5 5  Hip internal rotation    Hip external rotation    Knee flexion 5 5  Knee extension 4 5  Ankle dorsiflexion 3- 3+  Ankle plantarflexion    Ankle inversion    Ankle eversion    (Blank rows = not tested)  TRANSFERS: Sit to stand: SBA  Assistive device utilized: Environmental consultant - 2 wheeled     Stand to sit: SBA  Assistive device utilized: Environmental consultant - 2 wheeled      GAIT: Findings: Gait Characteristics: step through pattern, knee flexed in stance- Right, knee flexed in stance- Left, and wide BOS, Distance walked: 50 ft, Assistive device utilized:Walker - 2 wheeled, and Level of assistance: CGA  FUNCTIONAL TESTS:  5 times sit to stand: 17.28  Timed up and go (TUG): 24.97 sec 10 meter walk test: 18.28 sec (1.79 ft/sec) Berg Balance: 27/56    [facility] PT Assessment - 02/16/24 1048       Standardized Balance Assessment   Standardized Balance Assessment Berg Balance Test      Berg Balance Test   Sit to Stand Able to stand without using hands and stabilize independently     Standing Unsupported Able to stand 2 minutes with supervision    Sitting with Back Unsupported but Feet Supported on Floor or Stool Able to sit 2 minutes under supervision    Stand to Sit Controls descent by using hands    Transfers Able to transfer safely, definite need of hands    Standing Unsupported with Eyes Closed Able to stand 10 seconds with supervision    Standing Unsupported with Feet Together Able to place feet together independently and stand for 1 minute with supervision    From Standing, Reach Forward with Outstretched Arm Reaches forward but needs supervision    From Standing Position, Pick up Object from Floor Unable to pick up and needs supervision    From Standing Position, Turn to Look Behind Over each Shoulder Needs supervision when turning    Turn 360 Degrees Needs close supervision or verbal cueing   11.91 sec   Standing Unsupported, Alternately Place Feet on Step/Stool Needs assistance to keep from falling or unable to try    Standing Unsupported, One Foot in Front Needs help to step but can hold 15 seconds    Standing on One Leg Unable to try or needs assist to prevent fall    Total Score 27    Berg comment: Scores <45/56 indicate increased fall risk                                                                                                                                       TREATMENT DATE: 02/16/2024    PATIENT EDUCATION: Education details: Eval results, POC Person educated: Patient and Nurse, adult Education method: Explanation Education comprehension: verbalized understanding  HOME EXERCISE PROGRAM: At home, he is standing at counter:  marching, hip kicks side, knee bends, heel raises  GOALS: Goals reviewed with patient? Yes  SHORT TERM GOALS: Target date: 03/15/2024  Pt will be independent with HEP for improved strength, balance, gait. Baseline: Goal status: INITIAL  2.  Pt will improve 5x sit<>stand to less than or equal to 15 sec to  demonstrate improved functional strength and transfer efficiency. Baseline: 17.28 sec Goal status: INITIAL  3.  Pt will improve Berg score to at least 37/56 to decrease fall risk. Baseline: 27/56 Goal status: INITIAL   LONG TERM GOALS: Target date: 04/12/2024  Pt will be independent with HEP for improved strength, balance, gait. Baseline:  Goal status: INITIAL  2.  Pt will improve 5x sit<>stand to less than or equal to 12 sec to demonstrate  improved functional strength and transfer efficiency. Baseline: 17.28 sec Goal status: INITIAL  3.  Pt will improve Berg score to at least 48/56 to decrease fall risk. Baseline: 27/56 Goal status: INITIAL  4.  Pt will improve TUG score to less than or equal to 13 sec for decreased fall risk. Baseline: 24.97 sec Goal status: INITIAL  5.  Pt will improve gait velocity to at least 2.3 ft/sec for improved gait efficiency and safety. Baseline: 1.79 ft/sec Goal status: INITIAL  6.  Pt will verbalize understanding of fall prevention in home environment.  Baseline:  Goal status: INITIAL  ASSESSMENT:  CLINICAL IMPRESSION: Patient is a 78 y.o. male who was seen today for physical therapy evaluation and treatment for neurogenic claudication due to lumbar spinal stenosis, s/p surgery in July, and a recent fall. He presents to OPPT with decreased strength, decreased balance, decreased independence with transfers, gait, decreased safety awareness, abnormal posture. He is at increased fall risk per TUG, FTSTS, Berg, gait velocity scores; he has had a recent fall in early October.   He will benefit from skilled PT services to address the above stated deficits to improve functional mobility and independence and to decrease fall risk.    OBJECTIVE IMPAIRMENTS: Abnormal gait, decreased balance, decreased mobility, difficulty walking, decreased strength, impaired flexibility, and postural dysfunction.   ACTIVITY LIMITATIONS: standing, transfers, and  locomotion level  PARTICIPATION LIMITATIONS: meal prep, cleaning, laundry, shopping, community activity, and travel  PERSONAL FACTORS: 3+ comorbidities: see above are also affecting patient's functional outcome.   REHAB POTENTIAL: Good  CLINICAL DECISION MAKING: Evolving/moderate complexity  EVALUATION COMPLEXITY: Moderate  PLAN:  PT FREQUENCY: 2x/week  PT DURATION: 8 weeks plus eval  PLANNED INTERVENTIONS: 97750- Physical Performance Testing, 97110-Therapeutic exercises, 97530- Therapeutic activity, [id number]- Neuromuscular re-education, 97535- Self Care, [id number]- Manual therapy, [phone]- Gait training, Patient/Family education, and Balance training  PLAN FOR NEXT SESSION: Initiate HEP and progress strengthening, balance, gait.     STARLET GREIG ORN., PT 02/16/2024, 12:09 PM   Outpatient Rehab at [facility] [address] [place], [address] [place], [place] [address] Phone # [phone] Fax # [phone]

## 2024-02-20 ENCOUNTER — Ambulatory Visit: Admitting: Occupational Therapy

## 2024-02-20 DIAGNOSIS — R2689 Other abnormalities of gait and mobility: Secondary | ICD-10-CM

## 2024-02-20 DIAGNOSIS — M6281 Muscle weakness (generalized): Secondary | ICD-10-CM | POA: Diagnosis not present

## 2024-02-20 DIAGNOSIS — R41840 Attention and concentration deficit: Secondary | ICD-10-CM

## 2024-02-20 NOTE — Therapy (Signed)
 OUTPATIENT OCCUPATIONAL THERAPY NEURO  Treatment Note  Patient Name: Miguel Williams MRN: [id number] DOB:08/16/1945, 78 y.o., male Today's Date: 02/20/2024  PCP: Shayne Anes, MD REFERRING PROVIDER: Emeline Joesph BROCKS, DO  END OF SESSION:  OT End of Session - 02/20/24 1330     Visit Number 2    Number of Visits 7    Date for Recertification  03/29/24    Authorization Type [employer] 2025    Authorization Time Period Auth#: [id number] , approved 6 OT visits from 02/13/2024-04/09/2024    Authorization - Visit Number 1    Authorization - Number of Visits 6    OT Start Time 1320    OT Stop Time 1400    OT Time Calculation (min) 40 min           Past Medical History:  Diagnosis Date   Abdominal pain, lower    Agitation    Allergy, unspecified, initial encounter    Anxiety disorder    Arthritis    Asthma    As a child   Bipolar disorder (HCC)    Patient denies   Cancer (HCC)    Bladder   Cancer (HCC)    Skin cancer   Chronic kidney disease, stage III (moderate) (HCC)    Chronic kidney disease, stage III (moderate) (HCC)    COPD (chronic obstructive pulmonary disease) (HCC)    Pt denies (10/2023)   Coronary atherosclerosis of native coronary artery    Esophageal reflux    Fatigue    Fungal granuloma    Hip pain    History of 2019 novel coronavirus disease (COVID-19)    History of endoscopy 02/00/2011   History of kidney stones    Hypertension    Hypo-osmolality and hyponatremia    Hypokalemia    Hypothyroidism    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 now has 3oz of wine every other day (as of 10/2023)   Umbilical hernia    Unspecified fall, subsequent encounter    Vitamin D deficiency, unspecified    Wernicke's encephalopathy 06/23/2019   Past Surgical History:  Procedure Laterality Date   CATARACT EXTRACTION W/ INTRAOCULAR LENS IMPLANT Bilateral    COLONOSCOPY  04/2023   COLONOSCOPY W/ ENDOSCOPIC US      CYSTOSCOPY WITH FULGERATION N/A 03/09/2023   Procedure: CYSTOSCOPY WITH FULGERATION WITH CLOT EVACUATION;  Surgeon: Devere Lonni Righter, MD;  Location: [facility];  Service: Urology;  Laterality: N/A;   CYSTOSCOPY WITH URETHRAL DILATATION N/A 03/08/2023   Procedure: CYSTOSCOPY;  Surgeon: Devere Lonni Righter, MD;  Location: [facility];  Service: Urology;  Laterality: N/A;  30 MINUTES   ESOPHAGOGASTRODUODENOSCOPY     LUMBAR WOUND DEBRIDEMENT N/A 12/01/2023   Procedure: LUMBAR WOUND DEBRIDEMENT;  Surgeon: [name], MD;  Location: [facility] OR;  Service: Neurosurgery;  Laterality: N/A;   TONSILLECTOMY  04/25/1950   Patient Active Problem List   Diagnosis Date Noted  Right cervical radiculopathy 02/05/2024   Constipation 12/20/2023   Acute on chronic back pain 12/20/2023   Neuropathy 12/20/2023   Depression with anxiety 12/08/2023   Neurogenic claudication due to lumbar spinal stenosis 12/05/2023   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   Lumbar epidural mass (HCC) 12/01/2023   Spinal stenosis of lumbar region 11/15/2023   Family history of alcoholism 11/06/2023   Headache, occipital 11/06/2023   History of pancreatitis 11/06/2023   Hyperparathyroidism 11/06/2023   Hypertension 11/06/2023   Joint swelling 11/06/2023   Joint stiffness 11/06/2023   Laryngopharyngeal reflux 11/06/2023   Lumbar polyradiculopathy 11/06/2023   Memory loss 11/06/2023   Anemia, blood loss 11/06/2023   Symptomatic anemia 08/10/2023   Anemia 08/09/2023    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 11/27/2022   Urinary tract infection without hematuria 11/09/2022   Bladder tumor 09/12/2022   Nausea and vomiting 09/12/2022   Kidney stones 12/11/2021   Scalp laceration 07/01/2021   Wernicke's encephalopathy 06/23/2019   Chronic kidney disease, stage III (moderate) (HCC)    Hyperkalemia 04/12/2019   Chronic kidney disease due to hypertension 04/12/2019   Stage 3a chronic kidney disease (HCC) 04/12/2019   Alcohol  use, unspecified with unspecified alcohol -induced disorder 04/12/2019   Pyelonephritis 04/05/2019   Renal failure (ARF), acute on chronic 04/05/2019   Ureterolithiasis 04/05/2019   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   Presbycusis of both ears 05/27/2016   Hardening of the aorta (main artery of the heart) 02/15/2016   Neck pain 01/15/2016   Tremor 12/18/2015   Hearing loss of left ear 06/04/2015   Other specified diseases of blood and blood-forming organs 07/28/2014   Long term current use of therapeutic drug 09/09/2013   Hyperlipidemia 04/03/2013   Hemorrhoids 02/18/2013   History of infectious disease 02/18/2013   Gout 02/18/2013   Pain, dental 01/01/2013   Health maintenance examination 03/30/2011   Abnormal prostate exam 03/08/2011   Abnormal prostate specific antigen 03/08/2011   GASTROESOPHAGEAL REFLUX DISEASE, CHRONIC 12/24/2008   ACUTE BRONCHITIS 12/16/2008   Asthma with exacerbation 12/16/2008   GANGLION CYST, TENDON SHEATH 06/27/2008   FOOT PAIN, LEFT 05/23/2008   ADVERSE DRUG REACTION 03/10/2008   GOUT 03/08/2007   BPH/LUTS W/O OBSTRUCTION 03/08/2007   ELEVATED BP W/O HYPERTENSION 03/08/2007   INSOMNIA, CHRONIC, MILD 10/02/2006   CERUMEN IMPACTION, BILATERAL 10/02/2006    ONSET DATE: 11/15/23  REFERRING DIAG: F51.937 (ICD-10-CM) - Neurogenic claudication due to lumbar spinal stenosis  THERAPY DIAG:  Attention and concentration deficit  Other abnormalities of gait and mobility  Rationale for Evaluation and  Treatment: Rehabilitation  SUBJECTIVE:   SUBJECTIVE STATEMENT: Pt reports that he has graduated from the OT and PT that were coming in to the home.    Pt's nurse reports that he was able to go up the stairs with the home care therapist.  Pt accompanied by: self and nurse, [name]  PERTINENT HISTORY: presented 11/30/23 with low back pain and weakness along with several falls since his elective PLIF L4-S1 with decompressive laminectomies on 11/15/23. Workup has revealed large what looks like epidural hematoma and wound hematoma. S/p reexploration of lumbar wound for evacuation of hematoma 8/8. PMH of anxiety, cancer, CKD III, HLD, HTN, hypothyroidism  PRECAUTIONS: Back  WEIGHT BEARING RESTRICTIONS: No  PAIN:  Are you having pain? No  FALLS: Has patient fallen in last 6 months? Yes. Number of falls 4-6  LIVING ENVIRONMENT: Lives with: lives alone - has a engineer, civil (consulting) who is staying there 24/7 Lives in: Other townhouse  Stairs: 2 steps to enter with rails on B sides that he can reach, 2 story home but is able to remain on main floor with full bedroom/bathroom Has following equipment at home: Single point cane, Walker - 2 wheeled, shower chair, bed side commode, Grab bars, and hand held shower head, transport wheelchair  PLOF: Independent and Independent with basic ADLs  PATIENT GOALS: walking better, to move to [place]  to live near sisters  OBJECTIVE:  Note: Objective measures were completed at Evaluation unless otherwise noted.  HAND DOMINANCE: Left  ADLs: Transfers/ambulation related to ADLs: using RW with all mobility Eating: typically will order door dash for dinner Grooming: Mod I UB Dressing: Mod I LB Dressing: Mod I Toileting: Mod I Bathing: Mod I Tub Shower transfers: Mod I with grab bar Equipment: Grab bars, Walk in shower, bed side commode, Reacher, and Long handled sponge  IADLs: Light housekeeping: nurse is completing all Meal Prep: orders out, nurse will cook, will  fix his own coffee in [employer] mobility: no driving Medication management: nurse fills his pillboxes   MOBILITY STATUS: Hx of falls; min cues to remain within RW when ambulating  POSTURE COMMENTS:  rounded shoulders and forward head  ACTIVITY TOLERANCE: Activity tolerance: decreased endurance and balance  FUNCTIONAL OUTCOME MEASURES: PSFS: 2.3   UPPER EXTREMITY ROM:  WFL   UPPER EXTREMITY MMT:   5/5  COGNITION: Overall cognitive status: Impaired; pt and nurse/caregiver reporting decreased memory and sequencing                                                                                                                             TREATMENT DATE:  02/20/24 Pt completed pill box test with 100% accuracy, in 5:09 minutes.  Pt demonstrates good multi-tasking and probelm solving throughout task.  Pt demonstrating fair sequencing after dispensing meds, however educated on alterative technique to allow for increased recall, as pt going back to look at 2 pill bottles to ensure that he had already dispensed those.  OT educating on potential errors and impact of taking medications incorrectly. The Pillbox Test: A Measure of Executive Functioning and Estimate of Medication Management. A straight pass/fail designation is determined by 3 or more errors of omission or misplacement on the task and completion in under 5 mins. The pt completed the test with 0 errors. Medication management: OT educating pt and nurse/caregiver on variety of pill boxes and pill map to aid in sequencing and recall of medications. Pt to bring in medication list next visit to complete assessment with personal meds. Driving: pt asking questions about returning to driving.  OT deferring specific questions to MD, however reporting that OTs can work on various tasks required for driving.  OT educating nurse/caregiver and pt on local drivers rehab options.    02/13/24 Educated on role and purpose of OT with focus  on goal setting in regards to increasing safety and independence in the home and progressing away from  caregiver/nurse.  Pt initially stating that all he needed to work on was his balance, but with further explanation and phrasing, pt and nurse/caregiver able to provide functional tasks that can be focused on during therapy sessions.      PATIENT EDUCATION: Education details: medication management, driving Person educated: Patient and Nurse, Learning Disability method: Explanation Education comprehension: verbalized understanding and needs further education  HOME EXERCISE PROGRAM: TBD   GOALS: Goals reviewed with patient? Yes  SHORT TERM GOALS: Target date: 03/08/24  Pt will verbalize understanding of task modifications and/or potential A/E needs to increase ease, safety, and independence w/ ADLs and IADLs Baseline: nurse/caregiver completing all IADLs Goal status: in progress  2.  Pt will verbalize understanding of energy conservation techniques to increase independence and safety with IADLs. Baseline:  Goal status: in progress  3.  Pt will perform dynamic standing task for simple IADLS w/o LOB using DME and/or countertop support prn Baseline:  Goal status: in progress   LONG TERM GOALS: Target date: 03/29/24  Pt will complete pill box assessment with 1 or fewer errors for increased awareness with medication management. Baseline: nurse/caregiver completing managing meds 02/20/24: no errors, requiring 5:09 to complete Goal status: in progress  2.  Pt will demonstrate improved standing balance, tolerance, and awareness to complete laundry task at Mod I level. Baseline:  Goal status: in progress  3.  Pt will verbalize understanding of safe strategies and/or modifications to setup to increase safety with picking up items from ground. Baseline:  Goal status: v  4.  Patient will report at least two-point increase in average PSFS score or at least three-point increase in a  single activity score indicating functionally significant improvement given minimum detectable change. Baseline: 2.3 Goal status: in progress   ASSESSMENT:  CLINICAL IMPRESSION: Patient is a 78 y.o. male who was seen today for occupational therapy treatment for cognitive, endurance, and balance deficits s/p frequent falls with concussion and spinal stenosis.  Pt demonstrating good attention to medication management activity, however requiring greater time than allotted for pass designation.  Pt also with decreased sequencing and use of checks and balances that can negatively impact probability of errors.  Pt and nurse/caregiver reception to education on checks and balances and additional strategies/tools to aid in medication management. Pt will continue to benefit from skilled occupational therapy services to address balance, cognition, safety awareness, and introduction of compensatory strategies/AE prn to improve participation and safety during ADLs and IADLs.    PERFORMANCE DEFICITS: in functional skills including IADLs, flexibility, mobility, balance, body mechanics, endurance, decreased knowledge of precautions, and decreased knowledge of use of DME, cognitive skills including memory, safety awareness, and sequencing, and psychosocial skills including routines and behaviors.     PLAN:  OT FREQUENCY: 1x/week  OT DURATION: 6 weeks  PLANNED INTERVENTIONS: 97168 OT Re-evaluation, 97535 self care/ADL training, 02889 therapeutic exercise, 97530 therapeutic activity, 97112 neuromuscular re-education, balance training, functional mobility training, psychosocial skills training, energy conservation, coping strategies training, patient/family education, and DME and/or AE instructions  RECOMMENDED OTHER SERVICES: PT  CONSULTED AND AGREED WITH PLAN OF CARE: Patient and nurse/caregiver  PLAN FOR NEXT SESSION: complete med management with personal med list, further educate on adaptive strategies  for IADLs and setup of meds, dynamic standing activity (pipe tree puzzle)   Debhora Titus, OTR/L 02/20/2024, 1:31 PM  [facility] at [facility] [address] [place], [place] [address] Phone # [phone] Fax # [phone]

## 2024-02-20 NOTE — Patient Instructions (Signed)
Local Driver Evaluation Programs: ° °Comprehensive Evaluation: includes clinical and in vehicle behind the wheel testing by OCCUPATIONAL THERAPIST. Programs have varying levels of adaptive controls available for trial.  ° °[facility] °[address] °[place], [place]  [address] °[phone] or [phone] °http://www.driver-rehab.com °Evaluator:  Cyndee Crompton, OT/CDRS/CDI/SCDCM/Low Vision Certification ° °[facility] °[address] °[place], [place] [address] °[phone] °[email or web address]://www.[email or web address].[email or web address]/home/services/rehabilitation.aspx °Evaluators:  Shannon Sheek, OT and Jill Tucker, OT ° °[facility] [place] [facility] (ONLY SERVES VETERANS!!) °[facility] °[address] °[place], [place]  [address] °[phone] °http://www.[place].va.gov/services/Physical_Medicine_Rehabilitation_Services.asp °Evaluators:  Eric Andrews, KT; Heidi Harris, KT;  Gary Whitaker, KT (KT=kiniesotherapist) ° ° °Clinical evaluations only:  Includes clinical testing, refers to other programs or local certified driving instructor for behind the wheel testing. ° °[facility] (outpatient Rehab) °[facility] °[address] °[place], [place] [address] °[phone] for scheduling °[email or web address]-[email or web address]-Therapy.[email or web address] °Evaluators:  Kelly Lambeth, OT; Kate Phillips, OT ° °Other area clinical evaluators available upon request including [facility], [facility] and [facility]. ° ° °    Resource List °What is a Driver Evaluation: °Your Road Ahead - A Guide to Comprehensive Driving Evaluations °http://www.thehartford.com/resources/mature-market-excellence/publications-on-aging ° °Association for [employer] - Disability and Driving Fact Sheets °http://www.aded.net/?page=510 ° °Driving after a Brain  Injury: °[employer] °http://www.biausa.org/tbims-abstracts/if-there-is-an-effective-way-to-determine-if-someone-is-ready-to-drive-after-tbi?A=SearchResult&SearchID=9495675&ObjectID=2758842&ObjectType=35 ° °Driving with Adaptive Equipment: °[facility] Process °http://www.driver-rehab.com/adaptive-equipment ° °[employer] °http://www.nmeda.com/ ° ° ° ° ° ° °  °

## 2024-02-20 NOTE — Therapy (Signed)
 OUTPATIENT PHYSICAL THERAPY NEURO TREATMENT   Patient Name: Miguel Williams MRN: [id number] DOB:04-17-1946, 78 y.o., male Today's Date: 02/21/2024   PCP: Shayne Anes, MD REFERRING PROVIDER: Emeline Joesph BROCKS, DO   END OF SESSION:  PT End of Session - 02/21/24 1012     Visit Number 2    Number of Visits 17    Date for Recertification  04/12/24    Authorization Type [employer] Medicare    PT Start Time [phone]    PT Stop Time 1013    PT Time Calculation (min) 39 min    Equipment Utilized During Treatment --    Activity Tolerance Patient tolerated treatment well    Behavior During Therapy WFL for tasks assessed/performed           Past Medical History:  Diagnosis Date   Abdominal pain, lower    Agitation    Allergy, unspecified, initial encounter    Anxiety disorder    Arthritis    Asthma    As a child   Bipolar disorder (HCC)    Patient denies   Cancer (HCC)    Bladder   Cancer (HCC)    Skin cancer   Chronic kidney disease, stage III (moderate) (HCC)    Chronic kidney disease, stage III (moderate) (HCC)    COPD (chronic obstructive pulmonary disease) (HCC)    Pt denies (10/2023)   Coronary atherosclerosis of native coronary artery    Esophageal reflux    Fatigue    Fungal granuloma    Hip pain    History of 2019 novel coronavirus disease (COVID-19)    History of endoscopy 02/00/2011   History of kidney stones    Hypertension    Hypo-osmolality and hyponatremia    Hypokalemia    Hypothyroidism    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 now has 3oz of wine every other day (as of 10/2023)   Umbilical hernia    Unspecified fall, subsequent encounter    Vitamin D deficiency, unspecified    Wernicke's encephalopathy 06/23/2019   Past Surgical History:  Procedure Laterality Date   CATARACT EXTRACTION W/ INTRAOCULAR LENS IMPLANT Bilateral    COLONOSCOPY  04/2023   COLONOSCOPY W/ ENDOSCOPIC US      CYSTOSCOPY WITH FULGERATION N/A 03/09/2023   Procedure: CYSTOSCOPY WITH FULGERATION WITH CLOT EVACUATION;  Surgeon: Devere Lonni Righter, MD;  Location: [facility];  Service: Urology;  Laterality: N/A;   CYSTOSCOPY WITH URETHRAL DILATATION N/A 03/08/2023   Procedure: CYSTOSCOPY;  Surgeon: Devere Lonni Righter, MD;  Location: [facility];  Service: Urology;  Laterality: N/A;  30 MINUTES   ESOPHAGOGASTRODUODENOSCOPY     LUMBAR WOUND DEBRIDEMENT N/A 12/01/2023   Procedure: LUMBAR WOUND DEBRIDEMENT;  Surgeon: [name], MD;  Location: [facility];  Service: Neurosurgery;  Laterality: N/A;   TONSILLECTOMY  04/25/1950   Patient Active Problem List   Diagnosis Date Noted   Right cervical radiculopathy 02/05/2024  Constipation 12/20/2023   Acute on chronic back pain 12/20/2023   Neuropathy 12/20/2023   Depression with anxiety 12/08/2023   Neurogenic claudication due to lumbar spinal stenosis 12/05/2023   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   Lumbar epidural mass (HCC) 12/01/2023   Spinal stenosis of lumbar region 11/15/2023   Family history of alcoholism 11/06/2023   Headache, occipital 11/06/2023   History of pancreatitis 11/06/2023   Hyperparathyroidism 11/06/2023   Hypertension 11/06/2023   Joint swelling 11/06/2023   Joint stiffness 11/06/2023   Laryngopharyngeal reflux 11/06/2023   Lumbar polyradiculopathy 11/06/2023   Memory loss 11/06/2023   Anemia, blood loss 11/06/2023   Symptomatic anemia 08/10/2023   Anemia 08/09/2023   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 11/27/2022   Urinary tract infection without hematuria 11/09/2022   Bladder tumor 09/12/2022   Nausea and vomiting 09/12/2022   Kidney stones 12/11/2021   Scalp laceration 07/01/2021   Wernicke's encephalopathy 06/23/2019   Chronic kidney disease, stage III (moderate) (HCC)    Hyperkalemia 04/12/2019   Chronic kidney disease due to hypertension 04/12/2019   Stage 3a chronic kidney disease (HCC) 04/12/2019   Alcohol  use, unspecified with unspecified alcohol -induced disorder 04/12/2019   Pyelonephritis 04/05/2019   Renal failure (ARF), acute on chronic 04/05/2019   Ureterolithiasis 04/05/2019   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   Presbycusis of both ears 05/27/2016   Hardening of the aorta (main artery of the heart) 02/15/2016   Neck pain 01/15/2016   Tremor 12/18/2015   Hearing loss of left ear 06/04/2015   Other specified diseases of blood and blood-forming organs 07/28/2014   Long term current use of therapeutic drug 09/09/2013   Hyperlipidemia 04/03/2013   Hemorrhoids 02/18/2013   History of infectious disease 02/18/2013   Gout 02/18/2013   Pain, dental 01/01/2013   Health maintenance examination 03/30/2011   Abnormal prostate exam 03/08/2011   Abnormal prostate specific antigen 03/08/2011   GASTROESOPHAGEAL REFLUX DISEASE, CHRONIC 12/24/2008   ACUTE BRONCHITIS 12/16/2008   Asthma with exacerbation 12/16/2008   GANGLION CYST, TENDON SHEATH 06/27/2008   FOOT PAIN, LEFT 05/23/2008   ADVERSE DRUG REACTION 03/10/2008   GOUT 03/08/2007   BPH/LUTS W/O OBSTRUCTION 03/08/2007   ELEVATED BP W/O HYPERTENSION 03/08/2007   INSOMNIA, CHRONIC, MILD 10/02/2006   CERUMEN IMPACTION, BILATERAL 10/02/2006    ONSET DATE: 11/15/2023  REFERRING DIAG: F51.937 (ICD-10-CM) - Neurogenic claudication due to lumbar spinal stenosis   THERAPY DIAG:  Other abnormalities of gait and mobility  Muscle weakness (generalized)  Unsteadiness on feet  Abnormal posture  Rationale for  Evaluation and Treatment: Rehabilitation  SUBJECTIVE:  SUBJECTIVE STATEMENT: Nothing new.   Pt accompanied by: NurseGLENWOOD Ivanoff  PERTINENT HISTORY: presented 11/30/23 with low back pain and weakness along with several falls since his elective PLIF L4-S1 with decompressive laminectomies on 11/15/23. Workup has revealed large what looks like epidural hematoma and wound hematoma. S/p reexploration of lumbar wound for evacuation of hematoma 8/8. PMH of anxiety, cancer, CKD III, HLD, HTN, hypothyroidism, neuropathy   PAIN:  Are you having pain? Yes: NPRS scale: 2-3/10 Pain location: LB Pain description: sore Aggravating factors: not leaning on walker Relieving factors: sitting down  PRECAUTIONS: Back and Fall  RED FLAGS: None   WEIGHT BEARING RESTRICTIONS: No  FALLS: Has patient fallen in last 6 months? Yes. Number of falls 5 *Last two falls were without walker LIVING ENVIRONMENT: Lives with: lives alone and has a nurse who is staying in home 24/7 Lives in: Other townhouse Stairs: 2 steps to enter with rails on B sides that he can reach, 2 story home but is able to remain on main floor with full bedroom/bathroom  Has following equipment at home: Single point cane, Walker - 2 wheeled, shower chair, bed side commode, Grab bars, and hand held shower head, transport wheelchair   PLOF: Independent and Independent with basic ADLs  PATIENT GOALS: Balance and walking  OBJECTIVE:    At home, he is standing at counter:  marching, hip kicks side, knee bends, heel raises   TODAY'S TREATMENT: 02/21/24 Activity Comments  Nustep L5 x 6 min UEs/LEs Dynamic warm up  Initiate HEP: STS 5x, then 5x with green medball at chest with 3 sec lower  mini squat at RW 10x  Cueing for hip hinge with squats   Standing TB rows  at RW 10x each   PT guarding; cueing for UE alignment and posture, cues to extend knees   Paloff press green TB 10x each  Tendency for posterior LOB, requiring pt to sit down frequently      HOME EXERCISE PROGRAM Last updated: 02/21/24 Access Code: [email or web address] URL: [email or web address] Date: 02/21/2024 Prepared by: [facility] Outpatient  Rehab - [facility]  Program Notes perform standing activiites with supervision for safety   Exercises - Sit to Stand with Arms Crossed  - 1 x daily - 5 x weekly - 2 sets - 10 reps - Mini Squat with Counter Support  - 1 x daily - 5 x weekly - 2 sets - 10 reps - Standing Shoulder Row with Anchored Resistance  - 1 x daily - 5 x weekly - 2 sets - 10 reps - Seated Anti-Rotation Press With Anchored Resistance  - 1 x daily - 5 x weekly - 2 sets - 10 reps   PATIENT EDUCATION: Education details: HEP  Person educated: Patient and Nurse, Learning Disability method: Explanation, Demonstration, Tactile cues, Verbal cues, and Handouts Education comprehension: verbalized understanding and returned demonstration    Note: Objective measures were completed at Evaluation unless otherwise noted.  DIAGNOSTIC FINDINGS: CT 01/31/24-no abnormality  COGNITION: Overall cognitive status: Within functional limits for tasks assessed   SENSATION: Light touch: Impaired  and diminished sensation RLE  POSTURE: rounded shoulders, forward head, and posterior pelvic tilt In standing, knees crouched shaky in unsupported standing  LOWER EXTREMITY ROM:   pt slumps into posterior pelvic tilt with ROM  Active  Right Eval Left Eval  Hip flexion    Hip extension    Hip abduction    Hip adduction    Hip internal rotation    Hip external  rotation    Knee flexion    Knee extension -10 -20  Ankle dorsiflexion 10 15  Ankle plantarflexion    Ankle inversion    Ankle eversion     (Blank rows = not tested)  LOWER EXTREMITY MMT:    MMT Right Eval  Left Eval  Hip flexion 4 5  Hip extension    Hip abduction 4 4  Hip adduction 5 5  Hip internal rotation    Hip external rotation    Knee flexion 5 5  Knee extension 4 5  Ankle dorsiflexion 3- 3+  Ankle plantarflexion    Ankle inversion    Ankle eversion    (Blank rows = not tested)  TRANSFERS: Sit to stand: SBA  Assistive device utilized: Environmental Consultant - 2 wheeled     Stand to sit: SBA  Assistive device utilized: Environmental Consultant - 2 wheeled      GAIT: Findings: Gait Characteristics: step through pattern, knee flexed in stance- Right, knee flexed in stance- Left, and wide BOS, Distance walked: 50 ft, Assistive device utilized:Walker - 2 wheeled, and Level of assistance: CGA  FUNCTIONAL TESTS:  5 times sit to stand: 17.28 Timed up and go (TUG): 24.97 sec 10 meter walk test: 18.28 sec (1.79 ft/sec) Berg Balance: 27/56                                                                                                                                   TREATMENT DATE: 02/16/2024    PATIENT EDUCATION: Education details: Eval results, POC Person educated: Patient and Nurse, adult Education method: Explanation Education comprehension: verbalized understanding  HOME EXERCISE PROGRAM: At home, he is standing at counter:  marching, hip kicks side, knee bends, heel raises  GOALS: Goals reviewed with patient? Yes  SHORT TERM GOALS: Target date: 03/15/2024  Pt will be independent with HEP for improved strength, balance, gait. Baseline: Goal status: IN PROGRESS  2.  Pt will improve 5x sit<>stand to less than or equal to 15 sec to demonstrate improved functional strength and transfer efficiency. Baseline: 17.28 sec Goal status: IN PROGRESS  3.  Pt will improve Berg score to at least 37/56 to decrease fall risk. Baseline: 27/56 Goal status: IN PROGRESS   LONG TERM GOALS: Target date: 04/12/2024  Pt will be independent with HEP for improved strength, balance, gait. Baseline:  Goal  status: IN PROGRESS  2.  Pt will improve 5x sit<>stand to less than or equal to 12 sec to demonstrate improved functional strength and transfer efficiency. Baseline: 17.28 sec Goal status: IN PROGRESS  3.  Pt will improve Berg score to at least 48/56 to decrease fall risk. Baseline: 27/56 Goal status: IN PROGRESS  4.  Pt will improve TUG score to less than or equal to 13 sec for decreased fall risk. Baseline: 24.97 sec Goal status: IN PROGRESS  5.  Pt will improve gait velocity to at least 2.3 ft/sec for  improved gait efficiency and safety. Baseline: 1.79 ft/sec Goal status:IN PROGRESS  6.  Pt will verbalize understanding of fall prevention in home environment.  Baseline:  Goal status: IN PROGRESS  ASSESSMENT:  CLINICAL IMPRESSION: Patient arrived to session without new complaints. Session focused on initiation of HEP. Patient required corrective cueing for proper form with squats and demonstrated posterior LOB with paloff press. HEP was adminsitered based off of what patient can safely perform with caregiver's supervision. Patient tolerated session well and without complaints at end of appointment.     OBJECTIVE IMPAIRMENTS: Abnormal gait, decreased balance, decreased mobility, difficulty walking, decreased strength, impaired flexibility, and postural dysfunction.   ACTIVITY LIMITATIONS: standing, transfers, and locomotion level  PARTICIPATION LIMITATIONS: meal prep, cleaning, laundry, shopping, community activity, and travel  PERSONAL FACTORS: 3+ comorbidities: see above are also affecting patient's functional outcome.   REHAB POTENTIAL: Good  CLINICAL DECISION MAKING: Evolving/moderate complexity  EVALUATION COMPLEXITY: Moderate  PLAN:  PT FREQUENCY: 2x/week  PT DURATION: 8 weeks plus eval  PLANNED INTERVENTIONS: 97750- Physical Performance Testing, 97110-Therapeutic exercises, 97530- Therapeutic activity, [id number]- Neuromuscular re-education, 97535- Self Care, [id number]-  Manual therapy, [phone]- Gait training, Patient/Family education, and Balance training  PLAN FOR NEXT SESSION: review HEP and progress strengthening, balance, gait.     Louana Terrilyn Christians, PT, DPT 02/21/24 10:15 AM  [facility] at [facility] [address] [place], [address] [place], [place] [address] Phone # [phone] Fax # [phone]

## 2024-02-21 ENCOUNTER — Encounter: Payer: Self-pay | Admitting: Physical Therapy

## 2024-02-21 ENCOUNTER — Ambulatory Visit: Admitting: Physical Therapy

## 2024-02-21 DIAGNOSIS — R2681 Unsteadiness on feet: Secondary | ICD-10-CM

## 2024-02-21 DIAGNOSIS — M6281 Muscle weakness (generalized): Secondary | ICD-10-CM

## 2024-02-21 DIAGNOSIS — R2689 Other abnormalities of gait and mobility: Secondary | ICD-10-CM

## 2024-02-21 DIAGNOSIS — R293 Abnormal posture: Secondary | ICD-10-CM

## 2024-02-23 NOTE — Therapy (Signed)
 OUTPATIENT PHYSICAL THERAPY NEURO TREATMENT   Patient Name: Miguel Williams MRN: [id number] DOB:07-26-1945, 78 y.o., male Today's Date: 02/26/2024   PCP: Shayne Anes, MD REFERRING PROVIDER: Emeline Joesph BROCKS, DO   END OF SESSION:  PT End of Session - 02/26/24 1401     Visit Number 3    Number of Visits 17    Date for Recertification  04/12/24    Authorization Type [employer] Medicare    PT Start Time 1318    PT Stop Time 1401    PT Time Calculation (min) 43 min    Equipment Utilized During Treatment Gait belt    Activity Tolerance Patient tolerated treatment well    Behavior During Therapy WFL for tasks assessed/performed            Past Medical History:  Diagnosis Date   Abdominal pain, lower    Agitation    Allergy, unspecified, initial encounter    Anxiety disorder    Arthritis    Asthma    As a child   Bipolar disorder (HCC)    Patient denies   Cancer (HCC)    Bladder   Cancer (HCC)    Skin cancer   Chronic kidney disease, stage III (moderate) (HCC)    Chronic kidney disease, stage III (moderate) (HCC)    COPD (chronic obstructive pulmonary disease) (HCC)    Pt denies (10/2023)   Coronary atherosclerosis of native coronary artery    Esophageal reflux    Fatigue    Fungal granuloma    Hip pain    History of 2019 novel coronavirus disease (COVID-19)    History of endoscopy 02/00/2011   History of kidney stones    Hypertension    Hypo-osmolality and hyponatremia    Hypokalemia    Hypothyroidism    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 now has 3oz of wine every other day (as of 10/2023)   Umbilical hernia    Unspecified fall, subsequent encounter    Vitamin D deficiency, unspecified    Wernicke's encephalopathy 06/23/2019   Past Surgical History:  Procedure Laterality Date   CATARACT EXTRACTION W/ INTRAOCULAR LENS IMPLANT Bilateral    COLONOSCOPY  04/2023   COLONOSCOPY W/ ENDOSCOPIC US      CYSTOSCOPY WITH FULGERATION N/A 03/09/2023   Procedure: CYSTOSCOPY WITH FULGERATION WITH CLOT EVACUATION;  Surgeon: Devere Lonni Righter, MD;  Location: [facility];  Service: Urology;  Laterality: N/A;   CYSTOSCOPY WITH URETHRAL DILATATION N/A 03/08/2023   Procedure: CYSTOSCOPY;  Surgeon: Devere Lonni Righter, MD;  Location: [facility];  Service: Urology;  Laterality: N/A;  30 MINUTES   ESOPHAGOGASTRODUODENOSCOPY     LUMBAR WOUND DEBRIDEMENT N/A 12/01/2023   Procedure: LUMBAR WOUND DEBRIDEMENT;  Surgeon: [name], MD;  Location: [facility];  Service: Neurosurgery;  Laterality: N/A;   TONSILLECTOMY  04/25/1950   Patient Active Problem List   Diagnosis Date Noted   Right cervical  radiculopathy 02/05/2024   Constipation 12/20/2023   Acute on chronic back pain 12/20/2023   Neuropathy 12/20/2023   Depression with anxiety 12/08/2023   Neurogenic claudication due to lumbar spinal stenosis 12/05/2023   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   Lumbar epidural mass (HCC) 12/01/2023   Spinal stenosis of lumbar region 11/15/2023   Family history of alcoholism 11/06/2023   Headache, occipital 11/06/2023   History of pancreatitis 11/06/2023   Hyperparathyroidism 11/06/2023   Hypertension 11/06/2023   Joint swelling 11/06/2023   Joint stiffness 11/06/2023   Laryngopharyngeal reflux 11/06/2023   Lumbar polyradiculopathy 11/06/2023   Memory loss 11/06/2023   Anemia, blood loss 11/06/2023   Symptomatic anemia 08/10/2023   Anemia 08/09/2023   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 11/27/2022   Urinary tract infection without hematuria 11/09/2022   Bladder tumor 09/12/2022   Nausea and vomiting 09/12/2022   Kidney stones 12/11/2021   Scalp laceration 07/01/2021   Wernicke's encephalopathy 06/23/2019   Chronic kidney disease, stage III (moderate) (HCC)    Hyperkalemia 04/12/2019   Chronic kidney disease due to hypertension 04/12/2019   Stage 3a chronic kidney disease (HCC) 04/12/2019   Alcohol  use, unspecified with unspecified alcohol -induced disorder 04/12/2019   Pyelonephritis 04/05/2019   Renal failure (ARF), acute on chronic 04/05/2019   Ureterolithiasis 04/05/2019   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   Presbycusis of both ears 05/27/2016   Hardening of the aorta (main artery of the heart) 02/15/2016   Neck pain 01/15/2016   Tremor 12/18/2015   Hearing loss of left ear 06/04/2015   Other specified diseases of blood and blood-forming organs 07/28/2014   Long term current use of therapeutic drug 09/09/2013   Hyperlipidemia 04/03/2013   Hemorrhoids 02/18/2013   History of infectious disease 02/18/2013   Gout 02/18/2013   Pain, dental 01/01/2013   Health maintenance examination 03/30/2011   Abnormal prostate exam 03/08/2011   Abnormal prostate specific antigen 03/08/2011   GASTROESOPHAGEAL REFLUX DISEASE, CHRONIC 12/24/2008   ACUTE BRONCHITIS 12/16/2008   Asthma with exacerbation 12/16/2008   GANGLION CYST, TENDON SHEATH 06/27/2008   FOOT PAIN, LEFT 05/23/2008   ADVERSE DRUG REACTION 03/10/2008   GOUT 03/08/2007   BPH/LUTS W/O OBSTRUCTION 03/08/2007   ELEVATED BP W/O HYPERTENSION 03/08/2007   INSOMNIA, CHRONIC, MILD 10/02/2006   CERUMEN IMPACTION, BILATERAL 10/02/2006    ONSET DATE: 11/15/2023  REFERRING DIAG: F51.937 (ICD-10-CM) - Neurogenic claudication due to lumbar spinal stenosis   THERAPY DIAG:  Other abnormalities of gait and mobility  Muscle weakness (generalized)  Unsteadiness on feet  Abnormal posture  Rationale  for Evaluation and Treatment: Rehabilitation  SUBJECTIVE:  SUBJECTIVE STATEMENT: Reports no changes. Just saw the doctor. Has been working on [employer].   Pt accompanied by: NurseGLENWOOD Ivanoff  PERTINENT HISTORY: presented 11/30/23 with low back pain and weakness along with several falls since his elective PLIF L4-S1 with decompressive laminectomies on 11/15/23. Workup has revealed large what looks like epidural hematoma and wound hematoma. S/p reexploration of lumbar wound for evacuation of hematoma 8/8. PMH of anxiety, cancer, CKD III, HLD, HTN, hypothyroidism, neuropathy   PAIN:  Are you having pain? Yes: NPRS scale: 0/10 Pain location: LB Pain description: sore Aggravating factors: not leaning on walker Relieving factors: sitting down  PRECAUTIONS: Back and Fall  RED FLAGS: None   WEIGHT BEARING RESTRICTIONS: No  FALLS: Has patient fallen in last 6 months? Yes. Number of falls 5 *Last two falls were without walker LIVING ENVIRONMENT: Lives with: lives alone and has a nurse who is staying in home 24/7 Lives in: Other townhouse Stairs: 2 steps to enter with rails on B sides that he can reach, 2 story home but is able to remain on main floor with full bedroom/bathroom  Has following equipment at home: Single point cane, Walker - 2 wheeled, shower chair, bed side commode, Grab bars, and hand held shower head, transport wheelchair   PLOF: Independent and Independent with basic ADLs  PATIENT GOALS: Balance and walking  OBJECTIVE:     TODAY'S TREATMENT: 02/26/24 Activity Comments  Nustep L5 x 6 min UEs/LEs  Dynamic warm up; cueing to maintain ~60 SPM, then 70SPM  review of HEP:  STS with green medball 10x mini squat 10x  standing  green TB rows 10x sitting paloff press TB rows 10x  Cues for slow descent,  leading with hips rather than knees for squats, slight anterior pelvic tilt. PT provided CGA for standing rows   Sitting ant/pos pelvic tilts 10x  Demo and cueing required   standing ball toss with caregiver CGA; required sitting rest breaks after a few minutes of standing   standing passing red medball overhead, behind back  CGA; cueing to extend knees      HOME EXERCISE PROGRAM Access Code: [email or web address] URL: [email or web address] Date: 02/26/2024 Prepared by: [facility] - [facility]  Program Notes perform standing activiites with supervision for safety   Exercises - Sit to Stand with Arms Crossed  - 1 x daily - 5 x weekly - 2 sets - 10 reps - Mini Squat with Counter Support  - 1 x daily - 5 x weekly - 2 sets - 10 reps - Standing Shoulder Row with Anchored Resistance  - 1 x daily - 5 x weekly - 2 sets - 10 reps - Seated Anti-Rotation Press With Anchored Resistance  - 1 x daily - 5 x weekly - 2 sets - 10 reps - Seated Pelvic Tilts  - 1 x daily - 5 x weekly - 2 sets - 10 reps   PATIENT EDUCATION: Education details: HEP update  Person educated: Patient and Nurse, Learning Disability method: Explanation, Demonstration, Tactile cues, Verbal cues, and Handouts Education comprehension: verbalized understanding and returned demonstration    Note: Objective measures were completed at Evaluation unless otherwise noted.  DIAGNOSTIC FINDINGS: CT 01/31/24-no abnormality  COGNITION: Overall cognitive status: Within functional limits for tasks assessed   SENSATION: Light touch: Impaired  and diminished sensation RLE  POSTURE: rounded shoulders, forward head, and posterior pelvic tilt In standing, knees crouched shaky in unsupported standing  LOWER EXTREMITY ROM:   pt slumps into  posterior pelvic tilt with ROM  Active  Right Eval Left Eval  Hip flexion    Hip extension    Hip abduction    Hip adduction    Hip internal rotation    Hip external  rotation    Knee flexion    Knee extension -10 -20  Ankle dorsiflexion 10 15  Ankle plantarflexion    Ankle inversion    Ankle eversion     (Blank rows = not tested)  LOWER EXTREMITY MMT:    MMT Right Eval Left Eval  Hip flexion 4 5  Hip extension    Hip abduction 4 4  Hip adduction 5 5  Hip internal rotation    Hip external rotation    Knee flexion 5 5  Knee extension 4 5  Ankle dorsiflexion 3- 3+  Ankle plantarflexion    Ankle inversion    Ankle eversion    (Blank rows = not tested)  TRANSFERS: Sit to stand: SBA  Assistive device utilized: Environmental Consultant - 2 wheeled     Stand to sit: SBA  Assistive device utilized: Environmental Consultant - 2 wheeled      GAIT: Findings: Gait Characteristics: step through pattern, knee flexed in stance- Right, knee flexed in stance- Left, and wide BOS, Distance walked: 50 ft, Assistive device utilized:Walker - 2 wheeled, and Level of assistance: CGA  FUNCTIONAL TESTS:  5 times sit to stand: 17.28 Timed up and go (TUG): 24.97 sec 10 meter walk test: 18.28 sec (1.79 ft/sec) Berg Balance: 27/56                                                                                                                                   TREATMENT DATE: 02/16/2024    PATIENT EDUCATION: Education details: Eval results, POC Person educated: Patient and Nurse, adult Education method: Explanation Education comprehension: verbalized understanding  HOME EXERCISE PROGRAM: At home, he is standing at counter:  marching, hip kicks side, knee bends, heel raises  GOALS: Goals reviewed with patient? Yes  SHORT TERM GOALS: Target date: 03/15/2024  Pt will be independent with HEP for improved strength, balance, gait. Baseline: Goal status: IN PROGRESS  2.  Pt will improve 5x sit<>stand to less than or equal to 15 sec to demonstrate improved functional strength and transfer efficiency. Baseline: 17.28 sec Goal status: IN PROGRESS  3.  Pt will improve Berg score to at  least 37/56 to decrease fall risk. Baseline: 27/56 Goal status: IN PROGRESS   LONG TERM GOALS: Target date: 04/12/2024  Pt will be independent with HEP for improved strength, balance, gait. Baseline:  Goal status: IN PROGRESS  2.  Pt will improve 5x sit<>stand to less than or equal to 12 sec to demonstrate improved functional strength and transfer efficiency. Baseline: 17.28 sec Goal status: IN PROGRESS  3.  Pt will improve Berg score to at least 48/56 to decrease fall risk. Baseline: 27/56 Goal status: IN PROGRESS  4.  Pt will improve TUG score to less than or equal to 13 sec for decreased fall risk. Baseline: 24.97 sec Goal status: IN PROGRESS  5.  Pt will improve gait velocity to at least 2.3 ft/sec for improved gait efficiency and safety. Baseline: 1.79 ft/sec Goal status:IN PROGRESS  6.  Pt will verbalize understanding of fall prevention in home environment.  Baseline:  Goal status: IN PROGRESS  ASSESSMENT:  CLINICAL IMPRESSION: Patient arrived to session without complaints. Session focused on review of initial HEP for max carryover. Proceeded with additional standing balance activities with CGA. Patient did quite well maintaining dynamic standing balance with cueing to maintain more extended, upright position without physical assist required. Patient tolerated session well and without complaints at end of appointment.    OBJECTIVE IMPAIRMENTS: Abnormal gait, decreased balance, decreased mobility, difficulty walking, decreased strength, impaired flexibility, and postural dysfunction.   ACTIVITY LIMITATIONS: standing, transfers, and locomotion level  PARTICIPATION LIMITATIONS: meal prep, cleaning, laundry, shopping, community activity, and travel  PERSONAL FACTORS: 3+ comorbidities: see above are also affecting patient's functional outcome.   REHAB POTENTIAL: Good  CLINICAL DECISION MAKING: Evolving/moderate complexity  EVALUATION COMPLEXITY:  Moderate  PLAN:  PT FREQUENCY: 2x/week  PT DURATION: 8 weeks plus eval  PLANNED INTERVENTIONS: 97750- Physical Performance Testing, 97110-Therapeutic exercises, 97530- Therapeutic activity, [id number]- Neuromuscular re-education, 97535- Self Care, [id number]- Manual therapy, [phone]- Gait training, Patient/Family education, and Balance training  PLAN FOR NEXT SESSION: review HEP and progress strengthening, balance, gait.     Louana Terrilyn Christians, PT, DPT 02/26/24 2:02 PM  [facility] [address] [place], [address] [place], [place] [address] Phone # [phone] Fax # [phone]

## 2024-02-26 ENCOUNTER — Encounter: Payer: Self-pay | Admitting: Physical Therapy

## 2024-02-26 ENCOUNTER — Ambulatory Visit: Attending: Physical Medicine and Rehabilitation | Admitting: Occupational Therapy

## 2024-02-26 ENCOUNTER — Ambulatory Visit: Admitting: Physical Therapy

## 2024-02-26 DIAGNOSIS — R41840 Attention and concentration deficit: Secondary | ICD-10-CM | POA: Diagnosis present

## 2024-02-26 DIAGNOSIS — R2689 Other abnormalities of gait and mobility: Secondary | ICD-10-CM | POA: Diagnosis present

## 2024-02-26 DIAGNOSIS — R29898 Other symptoms and signs involving the musculoskeletal system: Secondary | ICD-10-CM | POA: Insufficient documentation

## 2024-02-26 DIAGNOSIS — M6281 Muscle weakness (generalized): Secondary | ICD-10-CM | POA: Insufficient documentation

## 2024-02-26 DIAGNOSIS — R2681 Unsteadiness on feet: Secondary | ICD-10-CM | POA: Insufficient documentation

## 2024-02-26 DIAGNOSIS — R293 Abnormal posture: Secondary | ICD-10-CM | POA: Insufficient documentation

## 2024-02-26 NOTE — Therapy (Signed)
 OUTPATIENT OCCUPATIONAL THERAPY NEURO  Treatment Note  Patient Name: Miguel Williams MRN: [id number] DOB:06/27/1945, 78 y.o., male Today's Date: 02/26/2024  PCP: Shayne Anes, MD REFERRING PROVIDER: Emeline Joesph BROCKS, DO  END OF SESSION:  OT End of Session - 02/26/24 1442     Visit Number 3    Number of Visits 7    Date for Recertification  03/29/24    Authorization Type [employer] 2025    Authorization Time Period Auth#: [id number] , approved 6 OT visits from 02/13/2024-04/09/2024    Authorization - Visit Number 2    Authorization - Number of Visits 6    OT Start Time 1402    OT Stop Time 1446    OT Time Calculation (min) 44 min            Past Medical History:  Diagnosis Date   Abdominal pain, lower    Agitation    Allergy, unspecified, initial encounter    Anxiety disorder    Arthritis    Asthma    As a child   Bipolar disorder (HCC)    Patient denies   Cancer (HCC)    Bladder   Cancer (HCC)    Skin cancer   Chronic kidney disease, stage III (moderate) (HCC)    Chronic kidney disease, stage III (moderate) (HCC)    COPD (chronic obstructive pulmonary disease) (HCC)    Pt denies (10/2023)   Coronary atherosclerosis of native coronary artery    Esophageal reflux    Fatigue    Fungal granuloma    Hip pain    History of 2019 novel coronavirus disease (COVID-19)    History of endoscopy 02/00/2011   History of kidney stones    Hypertension    Hypo-osmolality and hyponatremia    Hypokalemia    Hypothyroidism    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 now has 3oz of wine every other day (as of 10/2023)   Umbilical hernia    Unspecified fall, subsequent encounter    Vitamin D deficiency, unspecified    Wernicke's encephalopathy 06/23/2019   Past Surgical History:  Procedure Laterality Date   CATARACT EXTRACTION W/ INTRAOCULAR LENS IMPLANT Bilateral    COLONOSCOPY  04/2023   COLONOSCOPY W/ ENDOSCOPIC US      CYSTOSCOPY WITH FULGERATION N/A 03/09/2023   Procedure: CYSTOSCOPY WITH FULGERATION WITH CLOT EVACUATION;  Surgeon: Devere Lonni Righter, MD;  [facility]: [facility];  Service: Urology;  Laterality: N/A;   CYSTOSCOPY WITH URETHRAL DILATATION N/A 03/08/2023   Procedure: CYSTOSCOPY;  Surgeon: Devere Lonni Righter, MD;  Location: [facility];  Service: Urology;  Laterality: N/A;  30 MINUTES   ESOPHAGOGASTRODUODENOSCOPY     LUMBAR WOUND DEBRIDEMENT N/A 12/01/2023   Procedure: LUMBAR WOUND DEBRIDEMENT;  Surgeon: [name], MD;  Location: [facility];  Service: Neurosurgery;  Laterality: N/A;   TONSILLECTOMY  04/25/1950   Patient Active Problem List   Diagnosis Date Noted  Right cervical radiculopathy 02/05/2024   Constipation 12/20/2023   Acute on chronic back pain 12/20/2023   Neuropathy 12/20/2023   Depression with anxiety 12/08/2023   Neurogenic claudication due to lumbar spinal stenosis 12/05/2023   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   Lumbar epidural mass (HCC) 12/01/2023   Spinal stenosis of lumbar region 11/15/2023   Family history of alcoholism 11/06/2023   Headache, occipital 11/06/2023   History of pancreatitis 11/06/2023   Hyperparathyroidism 11/06/2023   Hypertension 11/06/2023   Joint swelling 11/06/2023   Joint stiffness 11/06/2023   Laryngopharyngeal reflux 11/06/2023   Lumbar polyradiculopathy 11/06/2023   Memory loss 11/06/2023   Anemia, blood loss 11/06/2023   Symptomatic anemia 08/10/2023   Anemia 08/09/2023    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 11/27/2022   Urinary tract infection without hematuria 11/09/2022   Bladder tumor 09/12/2022   Nausea and vomiting 09/12/2022   Kidney stones 12/11/2021   Scalp laceration 07/01/2021   Wernicke's encephalopathy 06/23/2019   Chronic kidney disease, stage III (moderate) (HCC)    Hyperkalemia 04/12/2019   Chronic kidney disease due to hypertension 04/12/2019   Stage 3a chronic kidney disease (HCC) 04/12/2019   Alcohol  use, unspecified with unspecified alcohol -induced disorder 04/12/2019   Pyelonephritis 04/05/2019   Renal failure (ARF), acute on chronic 04/05/2019   Ureterolithiasis 04/05/2019   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   Presbycusis of both ears 05/27/2016   Hardening of the aorta (main artery of the heart) 02/15/2016   Neck pain 01/15/2016   Tremor 12/18/2015   Hearing loss of left ear 06/04/2015   Other specified diseases of blood and blood-forming organs 07/28/2014   Long term current use of therapeutic drug 09/09/2013   Hyperlipidemia 04/03/2013   Hemorrhoids 02/18/2013   History of infectious disease 02/18/2013   Gout 02/18/2013   Pain, dental 01/01/2013   Health maintenance examination 03/30/2011   Abnormal prostate exam 03/08/2011   Abnormal prostate specific antigen 03/08/2011   GASTROESOPHAGEAL REFLUX DISEASE, CHRONIC 12/24/2008   ACUTE BRONCHITIS 12/16/2008   Asthma with exacerbation 12/16/2008   GANGLION CYST, TENDON SHEATH 06/27/2008   FOOT PAIN, LEFT 05/23/2008   ADVERSE DRUG REACTION 03/10/2008   GOUT 03/08/2007   BPH/LUTS W/O OBSTRUCTION 03/08/2007   ELEVATED BP W/O HYPERTENSION 03/08/2007   INSOMNIA, CHRONIC, MILD 10/02/2006   CERUMEN IMPACTION, BILATERAL 10/02/2006    ONSET DATE: 11/15/23  REFERRING DIAG: F51.937 (ICD-10-CM) - Neurogenic claudication due to lumbar spinal stenosis  THERAPY DIAG:  Muscle weakness (generalized)  Attention and concentration deficit  Rationale for Evaluation and Treatment:  Rehabilitation  SUBJECTIVE:   SUBJECTIVE STATEMENT: Pt reporting no changes.  Pt accompanied by: self and nurse, [name]  PERTINENT HISTORY: presented 11/30/23 with low back pain and weakness along with several falls since his elective PLIF L4-S1 with decompressive laminectomies on 11/15/23. Workup has revealed large what looks like epidural hematoma and wound hematoma. S/p reexploration of lumbar wound for evacuation of hematoma 8/8. PMH of anxiety, cancer, CKD III, HLD, HTN, hypothyroidism  PRECAUTIONS: Back  WEIGHT BEARING RESTRICTIONS: No  PAIN:  Are you having pain? No  FALLS: Has patient fallen in last 6 months? Yes. Number of falls 4-6  LIVING ENVIRONMENT: Lives with: lives alone - has a engineer, civil (consulting) who is staying there 24/7 Lives in: Other townhouse Stairs: 2 steps to enter with rails on B sides that he can reach, 2 story home but is able to remain on main floor with full bedroom/bathroom Has following equipment at home: Single point cane, Walker -  2 wheeled, shower chair, bed side commode, Grab bars, and hand held shower head, transport wheelchair  PLOF: Independent and Independent with basic ADLs  PATIENT GOALS: walking better, to move to [place]  to live near sisters  OBJECTIVE:  Note: Objective measures were completed at Evaluation unless otherwise noted.  HAND DOMINANCE: Left  ADLs: Transfers/ambulation related to ADLs: using RW with all mobility Eating: typically will order door dash for dinner Grooming: Mod I UB Dressing: Mod I LB Dressing: Mod I Toileting: Mod I Bathing: Mod I Tub Shower transfers: Mod I with grab bar Equipment: Grab bars, Walk in shower, bed side commode, Reacher, and Long handled sponge  IADLs: Light housekeeping: nurse is completing all Meal Prep: orders out, nurse will cook, will fix his own coffee in [employer] mobility: no driving Medication management: nurse fills his pillboxes   MOBILITY STATUS: Hx of falls; min cues to  remain within RW when ambulating  POSTURE COMMENTS:  rounded shoulders and forward head  ACTIVITY TOLERANCE: Activity tolerance: decreased endurance and balance  FUNCTIONAL OUTCOME MEASURES: PSFS: 2.3   UPPER EXTREMITY ROM:  WFL   UPPER EXTREMITY MMT:   5/5  COGNITION: Overall cognitive status: Impaired; pt and nurse/caregiver reporting decreased memory and sequencing                                                                                                                             TREATMENT DATE:  02/26/24 Medication: pt's nurse/caregiver reporting that she has already filled his medication for the month. Attention: Engaged in small peg board pattern replication with use of key to identify correlating colors, challenging alternating attention, sequencing, and awareness of errors. Pt scanning in linear fashion with good sequencing and attention to task.  OT educating on use of line follow with bookmark if needing increased focus. Educated on functional carryover of task implications with systematic process to minimize onset of errors with medication or other routine tasks. Pipe tree activity: pt sorting PVC pipe pieces based on pattern prior to putting together, based on instructions, pt then making one error requiring mod cues to recognize.  Pt also still reaching into container, despite removing needed pieces.  Therefore OT educating on modifying setup to allow for decreased distraction.  OT again educating on functional carryover of task.    02/20/24 Pt completed pill box test with 100% accuracy, in 5:09 minutes.  Pt demonstrates good multi-tasking and probelm solving throughout task.  Pt demonstrating fair sequencing after dispensing meds, however educated on alterative technique to allow for increased recall, as pt going back to look at 2 pill bottles to ensure that he had already dispensed those.  OT educating on potential errors and impact of taking medications  incorrectly. The Pillbox Test: A Measure of Executive Functioning and Estimate of Medication Management. A straight pass/fail designation is determined by 3 or more errors of omission or misplacement on the task and completion in under 5 mins. The pt  completed the test with 0 errors. Medication management: OT educating pt and nurse/caregiver on variety of pill boxes and pill map to aid in sequencing and recall of medications. Pt to bring in medication list next visit to complete assessment with personal meds. Driving: pt asking questions about returning to driving.  OT deferring specific questions to MD, however reporting that OTs can work on various tasks required for driving.  OT educating nurse/caregiver and pt on local drivers rehab options.    02/13/24 Educated on role and purpose of OT with focus on goal setting in regards to increasing safety and independence in the home and progressing away from caregiver/nurse.  Pt initially stating that all he needed to work on was his balance, but with further explanation and phrasing, pt and nurse/caregiver able to provide functional tasks that can be focused on during therapy sessions.      PATIENT EDUCATION: Education details: medication management, attention Person educated: Patient and Corporate Treasurer Education method: Explanation Education comprehension: verbalized understanding and needs further education  HOME EXERCISE PROGRAM: TBD   GOALS: Goals reviewed with patient? Yes  SHORT TERM GOALS: Target date: 03/08/24  Pt will verbalize understanding of task modifications and/or potential A/E needs to increase ease, safety, and independence w/ ADLs and IADLs Baseline: nurse/caregiver completing all IADLs Goal status: in progress  2.  Pt will verbalize understanding of energy conservation techniques to increase independence and safety with IADLs. Baseline:  Goal status: in progress  3.  Pt will perform dynamic standing task for simple  IADLS w/o LOB using DME and/or countertop support prn Baseline:  Goal status: in progress   LONG TERM GOALS: Target date: 03/29/24  Pt will complete pill box assessment with 1 or fewer errors for increased awareness with medication management. Baseline: nurse/caregiver completing managing meds 02/20/24: no errors, requiring 5:09 to complete Goal status: in progress  2.  Pt will demonstrate improved standing balance, tolerance, and awareness to complete laundry task at Mod I level. Baseline:  Goal status: in progress  3.  Pt will verbalize understanding of safe strategies and/or modifications to setup to increase safety with picking up items from ground. Baseline:  Goal status: in progress  4.  Patient will report at least two-point increase in average PSFS score or at least three-point increase in a single activity score indicating functionally significant improvement given minimum detectable change. Baseline: 2.3 Goal status: in progress   ASSESSMENT:  CLINICAL IMPRESSION: Patient is a 78 y.o. male who was seen today for occupational therapy treatment for cognitive, endurance, and balance deficits s/p frequent falls with concussion and spinal stenosis.  Pt demonstrating good attention to table top activities and voicing understanding of their carryover to medication management and other higher level IADLs.  Pt demonstrating distraction by clutter on table top, however able to attend to tasks despite being in moderately distracting environment.   OT reiterating use of checks and balances to increase independence with IADLs. Pt will continue to benefit from skilled occupational therapy services to address balance, cognition, safety awareness, and introduction of compensatory strategies/AE prn to improve participation and safety during ADLs and IADLs.    PERFORMANCE DEFICITS: in functional skills including IADLs, flexibility, mobility, balance, body mechanics, endurance, decreased  knowledge of precautions, and decreased knowledge of use of DME, cognitive skills including memory, safety awareness, and sequencing, and psychosocial skills including routines and behaviors.     PLAN:  OT FREQUENCY: 1x/week  OT DURATION: 6 weeks  PLANNED INTERVENTIONS: 02831 OT Re-evaluation,  02464 self care/ADL training, 02889 therapeutic exercise, 97530 therapeutic activity, 97112 neuromuscular re-education, balance training, functional mobility training, psychosocial skills training, energy conservation, coping strategies training, patient/family education, and DME and/or AE instructions  RECOMMENDED OTHER SERVICES: PT  CONSULTED AND AGREED WITH PLAN OF CARE: Patient and nurse/caregiver  PLAN FOR NEXT SESSION: complete med management with personal med list, further educate on adaptive strategies for IADLs and setup of meds, dynamic standing activity   Gretta Samons, OTR/L 02/26/2024, 3:44 PM  [facility] [address] [place], [place] [address] Phone # [phone] Fax # [phone]

## 2024-02-28 ENCOUNTER — Ambulatory Visit: Admitting: Physical Therapy

## 2024-03-06 ENCOUNTER — Ambulatory Visit

## 2024-03-06 ENCOUNTER — Ambulatory Visit: Admitting: Occupational Therapy

## 2024-03-06 DIAGNOSIS — R2689 Other abnormalities of gait and mobility: Secondary | ICD-10-CM

## 2024-03-06 DIAGNOSIS — R41840 Attention and concentration deficit: Secondary | ICD-10-CM

## 2024-03-06 DIAGNOSIS — M6281 Muscle weakness (generalized): Secondary | ICD-10-CM | POA: Diagnosis not present

## 2024-03-06 DIAGNOSIS — R2681 Unsteadiness on feet: Secondary | ICD-10-CM

## 2024-03-06 DIAGNOSIS — R293 Abnormal posture: Secondary | ICD-10-CM

## 2024-03-06 NOTE — Therapy (Signed)
 OUTPATIENT PHYSICAL THERAPY NEURO TREATMENT   Patient Name: Miguel Williams MRN: [id number] DOB:[birth year], 78 y.o., male Today's Date: 03/06/2024   PCP: Shayne Anes, MD REFERRING PROVIDER: Emeline Joesph BROCKS, DO   END OF SESSION:  PT End of Session - 03/06/24 1330     Visit Number 4    Number of Visits 17    Date for Recertification  04/12/24    Authorization Type [employer] Medicare    PT Start Time 1400    PT Stop Time 1445    PT Time Calculation (min) 45 min    Equipment Utilized During Treatment Gait belt    Activity Tolerance Patient tolerated treatment well    Behavior During Therapy WFL for tasks assessed/performed            Past Medical History:  Diagnosis Date   Abdominal pain, lower    Agitation    Allergy, unspecified, initial encounter    Anxiety disorder    Arthritis    Asthma    As a child   Bipolar disorder (HCC)    Patient denies   Cancer (HCC)    Bladder   Cancer (HCC)    Skin cancer   Chronic kidney disease, stage III (moderate) (HCC)    Chronic kidney disease, stage III (moderate) (HCC)    COPD (chronic obstructive pulmonary disease) (HCC)    Pt denies (10/2023)   Coronary atherosclerosis of native coronary artery    Esophageal reflux    Fatigue    Fungal granuloma    Hip pain    History of 2019 novel coronavirus disease (COVID-19)    History of endoscopy 02/00/2011   History of kidney stones    Hypertension    Hypo-osmolality and hyponatremia    Hypokalemia    Hypothyroidism    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 now has 3oz of wine every other day (as of 10/2023)   Umbilical hernia    Unspecified fall, subsequent encounter    Vitamin D deficiency, unspecified    Wernicke's encephalopathy 06/23/2019   Past Surgical History:  Procedure Laterality Date   CATARACT EXTRACTION W/ INTRAOCULAR LENS IMPLANT Bilateral    COLONOSCOPY  04/2023   COLONOSCOPY W/ ENDOSCOPIC US      CYSTOSCOPY WITH FULGERATION N/A 03/09/2023   Procedure: CYSTOSCOPY WITH FULGERATION WITH CLOT EVACUATION;  Surgeon: Devere Lonni Righter, MD;  Location: [facility];  Service: Urology;  Laterality: N/A;   CYSTOSCOPY WITH URETHRAL DILATATION N/A 03/08/2023   Procedure: CYSTOSCOPY;  Surgeon: Devere Lonni Righter, MD;  Location: [facility];  Service: Urology;  Laterality: N/A;  30 MINUTES   ESOPHAGOGASTRODUODENOSCOPY     LUMBAR WOUND DEBRIDEMENT N/A 12/01/2023   Procedure: LUMBAR WOUND DEBRIDEMENT;  Surgeon: [name], MD;  Location: [facility];  Service: Neurosurgery;  Laterality: N/A;   TONSILLECTOMY  04/25/1950   Patient Active Problem List   Diagnosis Date Noted   Right cervical  radiculopathy 02/05/2024   Constipation 12/20/2023   Acute on chronic back pain 12/20/2023   Neuropathy 12/20/2023   Depression with anxiety 12/08/2023   Neurogenic claudication due to lumbar spinal stenosis 12/05/2023   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   Lumbar epidural mass (HCC) 12/01/2023   Spinal stenosis of lumbar region 11/15/2023   Family history of alcoholism 11/06/2023   Headache, occipital 11/06/2023   History of pancreatitis 11/06/2023   Hyperparathyroidism 11/06/2023   Hypertension 11/06/2023   Joint swelling 11/06/2023   Joint stiffness 11/06/2023   Laryngopharyngeal reflux 11/06/2023   Lumbar polyradiculopathy 11/06/2023   Memory loss 11/06/2023   Anemia, blood loss 11/06/2023   Symptomatic anemia 08/10/2023   Anemia 08/09/2023   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 11/27/2022   Urinary tract infection without hematuria 11/09/2022   Bladder tumor 09/12/2022   Nausea and vomiting 09/12/2022   Kidney stones 12/11/2021   Scalp laceration 07/01/2021   Wernicke's encephalopathy 06/23/2019   Chronic kidney disease, stage III (moderate) (HCC)    Hyperkalemia 04/12/2019   Chronic kidney disease due to hypertension 04/12/2019   Stage 3a chronic kidney disease (HCC) 04/12/2019   Alcohol  use, unspecified with unspecified alcohol -induced disorder 04/12/2019   Pyelonephritis 04/05/2019   Renal failure (ARF), acute on chronic 04/05/2019   Ureterolithiasis 04/05/2019   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   Presbycusis of both ears 05/27/2016   Hardening of the aorta (main artery of the heart) 02/15/2016   Neck pain 01/15/2016   Tremor 12/18/2015   Hearing loss of left ear 06/04/2015   Other specified diseases of blood and blood-forming organs 07/28/2014   Long term current use of therapeutic drug 09/09/2013   Hyperlipidemia 04/03/2013   Hemorrhoids 02/18/2013   History of infectious disease 02/18/2013   Gout 02/18/2013   Pain, dental 01/01/2013   Health maintenance examination 03/30/2011   Abnormal prostate exam 03/08/2011   Abnormal prostate specific antigen 03/08/2011   GASTROESOPHAGEAL REFLUX DISEASE, CHRONIC 12/24/2008   ACUTE BRONCHITIS 12/16/2008   Asthma with exacerbation 12/16/2008   GANGLION CYST, TENDON SHEATH 06/27/2008   FOOT PAIN, LEFT 05/23/2008   ADVERSE DRUG REACTION 03/10/2008   GOUT 03/08/2007   BPH/LUTS W/O OBSTRUCTION 03/08/2007   ELEVATED BP W/O HYPERTENSION 03/08/2007   INSOMNIA, CHRONIC, MILD 10/02/2006   CERUMEN IMPACTION, BILATERAL 10/02/2006    ONSET DATE: 11/15/2023  REFERRING DIAG: F51.937 (ICD-10-CM) - Neurogenic claudication due to lumbar spinal stenosis   THERAPY DIAG:  Other abnormalities of gait and mobility  Muscle weakness (generalized)  Unsteadiness on feet  Abnormal posture  Rationale  for Evaluation and Treatment: Rehabilitation  SUBJECTIVE:  SUBJECTIVE STATEMENT: Doing ok, no new issues. Feeling stronger  Pt accompanied by: NurseGLENWOOD Ivanoff  PERTINENT HISTORY: presented 11/30/23 with low back pain and weakness along with several falls since his elective PLIF L4-S1 with decompressive laminectomies on 11/15/23. Workup has revealed large what looks like epidural hematoma and wound hematoma. S/p reexploration of lumbar wound for evacuation of hematoma 8/8. PMH of anxiety, cancer, CKD III, HLD, HTN, hypothyroidism, neuropathy   PAIN:  Are you having pain? Yes: NPRS scale: 0/10 Pain location: LB Pain description: sore Aggravating factors: not leaning on walker Relieving factors: sitting down  PRECAUTIONS: Back and Fall  RED FLAGS: None   WEIGHT BEARING RESTRICTIONS: No  FALLS: Has patient fallen in last 6 months? Yes. Number of falls 5 *Last two falls were without walker LIVING ENVIRONMENT: Lives with: lives alone and has a nurse who is staying in home 24/7 Lives in: Other townhouse Stairs: 2 steps to enter with rails on B sides that he can reach, 2 story home but is able to remain on main floor with full bedroom/bathroom  Has following equipment at home: Single point cane, Walker - 2 wheeled, shower chair, bed side commode, Grab bars, and hand held shower head, transport wheelchair   PLOF: Independent and Independent with basic ADLs  PATIENT GOALS: Balance and walking  OBJECTIVE:    TODAY'S TREATMENT: 03/06/24 Activity Comments  NU-step x 8 min resistance intervals 2 min warm-up 30 sec heavy; 60 sec light  Step-ups 2x10; forward/lateral HR, 8, cues in sequence LLE weakness evident  Seated hamstring curls 1x10 15#, 20#  Sit to stand negatives 5x5 10# goblet (no weight for sit to  stand, 10# for stand to sit)  Gait training Trials w/ straight cane and CGA-min A; Trendelenberg L > R        TODAY'S TREATMENT: 02/26/24 Activity Comments  Nustep L5 x 6 min UEs/LEs  Dynamic warm up; cueing to maintain ~60 SPM, then 70SPM  review of HEP:  STS with green medball 10x mini squat 10x  standing  green TB rows 10x sitting paloff press TB rows 10x  Cues for slow descent, leading with hips rather than knees for squats, slight anterior pelvic tilt. PT provided CGA for standing rows   Sitting ant/pos pelvic tilts 10x  Demo and cueing required   standing ball toss with caregiver CGA; required sitting rest breaks after a few minutes of standing   standing passing red medball overhead, behind back  CGA; cueing to extend knees      HOME EXERCISE PROGRAM Access Code: [email or web address] URL: [email or web address] Date: 02/26/2024 Prepared by: [facility] - [facility]  Program Notes perform standing activiites with supervision for safety   Exercises - Sit to Stand with Arms Crossed  - 1 x daily - 5 x weekly - 2 sets - 10 reps - Mini Squat with Counter Support  - 1 x daily - 5 x weekly - 2 sets - 10 reps - Standing Shoulder Row with Anchored Resistance  - 1 x daily - 5 x weekly - 2 sets - 10 reps - Seated Anti-Rotation Press With Anchored Resistance  - 1 x daily - 5 x weekly - 2 sets - 10 reps - Seated Pelvic Tilts  - 1 x daily - 5 x weekly - 2 sets - 10 reps   PATIENT EDUCATION: Education details: HEP update  Person educated: Patient and Corporate Treasurer Education method: Explanation, Demonstration, Tactile cues, Verbal cues, and Handouts Education comprehension:  verbalized understanding and returned demonstration    Note: Objective measures were completed at Evaluation unless otherwise noted.  DIAGNOSTIC FINDINGS: CT 01/31/24-no abnormality  COGNITION: Overall cognitive status: Within functional limits for tasks  assessed   SENSATION: Light touch: Impaired  and diminished sensation RLE  POSTURE: rounded shoulders, forward head, and posterior pelvic tilt In standing, knees crouched shaky in unsupported standing  LOWER EXTREMITY ROM:   pt slumps into posterior pelvic tilt with ROM  Active  Right Eval Left Eval  Hip flexion    Hip extension    Hip abduction    Hip adduction    Hip internal rotation    Hip external rotation    Knee flexion    Knee extension -10 -20  Ankle dorsiflexion 10 15  Ankle plantarflexion    Ankle inversion    Ankle eversion     (Blank rows = not tested)  LOWER EXTREMITY MMT:    MMT Right Eval Left Eval  Hip flexion 4 5  Hip extension    Hip abduction 4 4  Hip adduction 5 5  Hip internal rotation    Hip external rotation    Knee flexion 5 5  Knee extension 4 5  Ankle dorsiflexion 3- 3+  Ankle plantarflexion    Ankle inversion    Ankle eversion    (Blank rows = not tested)  TRANSFERS: Sit to stand: SBA  Assistive device utilized: Environmental Consultant - 2 wheeled     Stand to sit: SBA  Assistive device utilized: Environmental Consultant - 2 wheeled      GAIT: Findings: Gait Characteristics: step through pattern, knee flexed in stance- Right, knee flexed in stance- Left, and wide BOS, Distance walked: 50 ft, Assistive device utilized:Walker - 2 wheeled, and Level of assistance: CGA  FUNCTIONAL TESTS:  5 times sit to stand: 17.28 Timed up and go (TUG): 24.97 sec 10 meter walk test: 18.28 sec (1.79 ft/sec) Berg Balance: 27/56                                                                                                                                   TREATMENT DATE: 02/16/2024    PATIENT EDUCATION: Education details: Eval results, POC Person educated: Patient and Nurse, adult Education method: Explanation Education comprehension: verbalized understanding  HOME EXERCISE PROGRAM: At home, he is standing at counter:  marching, hip kicks side, knee bends, heel  raises  GOALS: Goals reviewed with patient? Yes  SHORT TERM GOALS: Target date: 03/15/2024  Pt will be independent with HEP for improved strength, balance, gait. Baseline: Goal status: IN PROGRESS  2.  Pt will improve 5x sit<>stand to less than or equal to 15 sec to demonstrate improved functional strength and transfer efficiency. Baseline: 17.28 sec Goal status: IN PROGRESS  3.  Pt will improve Berg score to at least 37/56 to decrease fall risk. Baseline: 27/56 Goal status: IN PROGRESS   LONG TERM GOALS: Target  date: 04/12/2024  Pt will be independent with HEP for improved strength, balance, gait. Baseline:  Goal status: IN PROGRESS  2.  Pt will improve 5x sit<>stand to less than or equal to 12 sec to demonstrate improved functional strength and transfer efficiency. Baseline: 17.28 sec Goal status: IN PROGRESS  3.  Pt will improve Berg score to at least 48/56 to decrease fall risk. Baseline: 27/56 Goal status: IN PROGRESS  4.  Pt will improve TUG score to less than or equal to 13 sec for decreased fall risk. Baseline: 24.97 sec Goal status: IN PROGRESS  5.  Pt will improve gait velocity to at least 2.3 ft/sec for improved gait efficiency and safety. Baseline: 1.79 ft/sec Goal status:IN PROGRESS  6.  Pt will verbalize understanding of fall prevention in home environment.  Baseline:  Goal status: IN PROGRESS  ASSESSMENT:  CLINICAL IMPRESSION: Instructed in strength training with mix of closed chain and open chain for improved strength/power for control in negotiating stairs and transfers. Gait training trials w/ cane and CGA-min A with cues in control/sequence but exhibits bilateral Trendelenberg left > Right.  Continued sessions to advance POC details to improve mobility, balance, activity tolerance    OBJECTIVE IMPAIRMENTS: Abnormal gait, decreased balance, decreased mobility, difficulty walking, decreased strength, impaired flexibility, and postural dysfunction.    ACTIVITY LIMITATIONS: standing, transfers, and locomotion level  PARTICIPATION LIMITATIONS: meal prep, cleaning, laundry, shopping, community activity, and travel  PERSONAL FACTORS: 3+ comorbidities: see above are also affecting patient's functional outcome.   REHAB POTENTIAL: Good  CLINICAL DECISION MAKING: Evolving/moderate complexity  EVALUATION COMPLEXITY: Moderate  PLAN:  PT FREQUENCY: 2x/week  PT DURATION: 8 weeks plus eval  PLANNED INTERVENTIONS: 97750- Physical Performance Testing, 97110-Therapeutic exercises, 97530- Therapeutic activity, [id number]- Neuromuscular re-education, 97535- Self Care, [id number]- Manual therapy, [id number]- Gait training, Patient/Family education, and Balance training  PLAN FOR NEXT SESSION: review HEP and progress strengthening, balance, gait.     2:59 PM, 03/06/24 M. Kelly Zykeriah Mathia, PT, DPT Physical Therapist- [facility] Office Number: [phone]

## 2024-03-06 NOTE — Therapy (Signed)
 OUTPATIENT OCCUPATIONAL THERAPY NEURO  Treatment Note  Patient Name: Miguel Williams MRN: [id number] DOB:[birth year], 78 y.o., male Today's Date: 03/06/2024  PCP: Shayne Anes, MD REFERRING PROVIDER: Emeline Joesph BROCKS, DO  END OF SESSION:  OT End of Session - 03/06/24 1401     Visit Number 4    Number of Visits 7    Date for Recertification  03/29/24    Authorization Type [employer] 2025    Authorization Time Period Auth#: [id number] , approved 6 OT visits from 02/13/2024-04/09/2024    Authorization - Visit Number 3    Authorization - Number of Visits 6    OT Start Time 1318    OT Stop Time 1400    OT Time Calculation (min) 42 min             Past Medical History:  Diagnosis Date   Abdominal pain, lower    Agitation    Allergy, unspecified, initial encounter    Anxiety disorder    Arthritis    Asthma    As a child   Bipolar disorder (HCC)    Patient denies   Cancer (HCC)    Bladder   Cancer (HCC)    Skin cancer   Chronic kidney disease, stage III (moderate) (HCC)    Chronic kidney disease, stage III (moderate) (HCC)    COPD (chronic obstructive pulmonary disease) (HCC)    Pt denies (10/2023)   Coronary atherosclerosis of native coronary artery    Esophageal reflux    Fatigue    Fungal granuloma    Hip pain    History of 2019 novel coronavirus disease (COVID-19)    History of endoscopy 02/00/2011   History of kidney stones    Hypertension    Hypo-osmolality and hyponatremia    Hypokalemia    Hypothyroidism    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 now has 3oz of wine every other day (as of 10/2023)   Umbilical hernia    Unspecified fall, subsequent encounter    Vitamin D deficiency, unspecified    Wernicke's encephalopathy 06/23/2019   Past Surgical History:  Procedure Laterality Date   CATARACT EXTRACTION W/ INTRAOCULAR LENS IMPLANT Bilateral    COLONOSCOPY  04/2023   COLONOSCOPY W/ ENDOSCOPIC US      CYSTOSCOPY WITH FULGERATION N/A 03/09/2023   Procedure: CYSTOSCOPY WITH FULGERATION WITH CLOT EVACUATION;  Surgeon: Devere Lonni Righter, MD;  [facility]: [facility];  Service: Urology;  Laterality: N/A;   CYSTOSCOPY WITH URETHRAL DILATATION N/A 03/08/2023   Procedure: CYSTOSCOPY;  Surgeon: Devere Lonni Righter, MD;  Location: [facility];  Service: Urology;  Laterality: N/A;  30 MINUTES   ESOPHAGOGASTRODUODENOSCOPY     LUMBAR WOUND DEBRIDEMENT N/A 12/01/2023   Procedure: LUMBAR WOUND DEBRIDEMENT;  Surgeon: [name], MD;  Location: [facility];  Service: Neurosurgery;  Laterality: N/A;   TONSILLECTOMY  04/25/1950   Patient Active Problem List   Diagnosis Date  Noted   Right cervical radiculopathy 02/05/2024   Constipation 12/20/2023   Acute on chronic back pain 12/20/2023   Neuropathy 12/20/2023   Depression with anxiety 12/08/2023   Neurogenic claudication due to lumbar spinal stenosis 12/05/2023   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   Lumbar epidural mass (HCC) 12/01/2023   Spinal stenosis of lumbar region 11/15/2023   Family history of alcoholism 11/06/2023   Headache, occipital 11/06/2023   History of pancreatitis 11/06/2023   Hyperparathyroidism 11/06/2023   Hypertension 11/06/2023   Joint swelling 11/06/2023   Joint stiffness 11/06/2023   Laryngopharyngeal reflux 11/06/2023   Lumbar polyradiculopathy 11/06/2023   Memory loss 11/06/2023   Anemia, blood loss 11/06/2023   Symptomatic anemia 08/10/2023   Anemia 08/09/2023    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 11/27/2022   Urinary tract infection without hematuria 11/09/2022   Bladder tumor 09/12/2022   Nausea and vomiting 09/12/2022   Kidney stones 12/11/2021   Scalp laceration 07/01/2021   Wernicke's encephalopathy 06/23/2019   Chronic kidney disease, stage III (moderate) (HCC)    Hyperkalemia 04/12/2019   Chronic kidney disease due to hypertension 04/12/2019   Stage 3a chronic kidney disease (HCC) 04/12/2019   Alcohol  use, unspecified with unspecified alcohol -induced disorder 04/12/2019   Pyelonephritis 04/05/2019   Renal failure (ARF), acute on chronic 04/05/2019   Ureterolithiasis 04/05/2019   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   Presbycusis of both ears 05/27/2016   Hardening of the aorta (main artery of the heart) 02/15/2016   Neck pain 01/15/2016   Tremor 12/18/2015   Hearing loss of left ear 06/04/2015   Other specified diseases of blood and blood-forming organs 07/28/2014   Long term current use of therapeutic drug 09/09/2013   Hyperlipidemia 04/03/2013   Hemorrhoids 02/18/2013   History of infectious disease 02/18/2013   Gout 02/18/2013   Pain, dental 01/01/2013   Health maintenance examination 03/30/2011   Abnormal prostate exam 03/08/2011   Abnormal prostate specific antigen 03/08/2011   GASTROESOPHAGEAL REFLUX DISEASE, CHRONIC 12/24/2008   ACUTE BRONCHITIS 12/16/2008   Asthma with exacerbation 12/16/2008   GANGLION CYST, TENDON SHEATH 06/27/2008   FOOT PAIN, LEFT 05/23/2008   ADVERSE DRUG REACTION 03/10/2008   GOUT 03/08/2007   BPH/LUTS W/O OBSTRUCTION 03/08/2007   ELEVATED BP W/O HYPERTENSION 03/08/2007   INSOMNIA, CHRONIC, MILD 10/02/2006   CERUMEN IMPACTION, BILATERAL 10/02/2006    ONSET DATE: 11/15/23  REFERRING DIAG: F51.937 (ICD-10-CM) - Neurogenic claudication due to lumbar spinal stenosis  THERAPY DIAG:  Other abnormalities of gait and mobility  Attention and concentration deficit  Rationale for Evaluation and  Treatment: Rehabilitation  SUBJECTIVE:   SUBJECTIVE STATEMENT: Pt reports that he has not had any falls and that he got a clean bill of health at his physical.    Pt accompanied by: self  PERTINENT HISTORY: presented 11/30/23 with low back pain and weakness along with several falls since his elective PLIF L4-S1 with decompressive laminectomies on 11/15/23. Workup has revealed large what looks like epidural hematoma and wound hematoma. S/p reexploration of lumbar wound for evacuation of hematoma 8/8. PMH of anxiety, cancer, CKD III, HLD, HTN, hypothyroidism  PRECAUTIONS: Back  WEIGHT BEARING RESTRICTIONS: No  PAIN:  Are you having pain? No  FALLS: Has patient fallen in last 6 months? Yes. Number of falls 4-6  LIVING ENVIRONMENT: Lives with: lives alone - has a engineer, civil (consulting) who is staying there 24/7 Lives in: Other townhouse Stairs: 2 steps to enter with rails on B sides that he can reach, 2  story home but is able to remain on main floor with full bedroom/bathroom Has following equipment at home: Single point cane, Walker - 2 wheeled, shower chair, bed side commode, Grab bars, and hand held shower head, transport wheelchair  PLOF: Independent and Independent with basic ADLs  PATIENT GOALS: walking better, to move to [place]  to live near sisters  OBJECTIVE:  Note: Objective measures were completed at Evaluation unless otherwise noted.  HAND DOMINANCE: Left  ADLs: Transfers/ambulation related to ADLs: using RW with all mobility Eating: typically will order door dash for dinner Grooming: Mod I UB Dressing: Mod I LB Dressing: Mod I Toileting: Mod I Bathing: Mod I Tub Shower transfers: Mod I with grab bar Equipment: Grab bars, Walk in shower, bed side commode, Reacher, and Long handled sponge  IADLs: Light housekeeping: nurse is completing all Meal Prep: orders out, nurse will cook, will fix his own coffee in [employer] mobility: no driving Medication management: nurse  fills his pillboxes   MOBILITY STATUS: Hx of falls; min cues to remain within RW when ambulating  POSTURE COMMENTS:  rounded shoulders and forward head  ACTIVITY TOLERANCE: Activity tolerance: decreased endurance and balance  FUNCTIONAL OUTCOME MEASURES: PSFS: 2.3   UPPER EXTREMITY ROM:  WFL   UPPER EXTREMITY MMT:   5/5  COGNITION: Overall cognitive status: Impaired; pt and nurse/caregiver reporting decreased memory and sequencing                                                                                                                             TREATMENT DATE:  03/06/24  Pt completed pill box test with 100% accuracy, in 4:53 minutes.  Pt demonstrates good multi-tasking and probelm solving throughout task.  Pt dropping 1 pills onto floor, discussed how he would deal with that if that were to happen at home without assist. Functional cognition further assessed with The Pillbox Test: A Measure of Executive Functioning and Estimate of Medication Management. A straight pass/fail designation is determined by 3 or more errors of omission or misplacement on the task. The pt completed the test with 0 errors. Dynamic standing: engaged in picking up clothespins on L lower surface with LUE and then RUE to incorporate increased trunk rotation, placing on vertical dowel at midline in standing.  Pt with one LOB when reaching across midline with RUE, requiring backwards step and min assist to correct.  Pt taking seated rest break after 5 mins of standing.  Pt then reaching with LUE across midline to clothespins on R and then placing on vertical dowel at midline.  Pt demonstrating improved trunk rotation to R and balance, however with 1 LOB but able to correct without assistance.  OT educating on functional carryover to laundry task and emptying dishwasher.     02/26/24 Medication: pt's nurse/caregiver reporting that she has already filled his medication for the month. Attention: Engaged in  small peg board pattern replication with use of key to identify correlating  colors, challenging alternating attention, sequencing, and awareness of errors. Pt scanning in linear fashion with good sequencing and attention to task.  OT educating on use of line follow with bookmark if needing increased focus. Educated on functional carryover of task implications with systematic process to minimize onset of errors with medication or other routine tasks. Pipe tree activity: pt sorting PVC pipe pieces based on pattern prior to putting together, based on instructions, pt then making one error requiring mod cues to recognize.  Pt also still reaching into container, despite removing needed pieces.  Therefore OT educating on modifying setup to allow for decreased distraction.  OT again educating on functional carryover of task.    02/20/24 Pt completed pill box test with 100% accuracy, in 5:09 minutes.  Pt demonstrates good multi-tasking and probelm solving throughout task.  Pt demonstrating fair sequencing after dispensing meds, however educated on alterative technique to allow for increased recall, as pt going back to look at 2 pill bottles to ensure that he had already dispensed those.  OT educating on potential errors and impact of taking medications incorrectly. The Pillbox Test: A Measure of Executive Functioning and Estimate of Medication Management. A straight pass/fail designation is determined by 3 or more errors of omission or misplacement on the task and completion in under 5 mins. The pt completed the test with 0 errors. Medication management: OT educating pt and nurse/caregiver on variety of pill boxes and pill map to aid in sequencing and recall of medications. Pt to bring in medication list next visit to complete assessment with personal meds. Driving: pt asking questions about returning to driving.  OT deferring specific questions to MD, however reporting that OTs can work on various tasks required  for driving.  OT educating nurse/caregiver and pt on local drivers rehab options.   PATIENT EDUCATION: Education details: medication management, attention Person educated: Patient and Corporate Treasurer Education method: Explanation Education comprehension: verbalized understanding and needs further education  HOME EXERCISE PROGRAM: TBD   GOALS: Goals reviewed with patient? Yes  SHORT TERM GOALS: Target date: 03/08/24  Pt will verbalize understanding of task modifications and/or potential A/E needs to increase ease, safety, and independence w/ ADLs and IADLs Baseline: nurse/caregiver completing all IADLs 03/06/24: pt reporting nurse/caregiver to transition to 7-8 hour work days and no weekends starting next week, educated on recommendation to start attempting tasks with supervision that he will need to complete when she is not around.  Goal status: in progress  2.  Pt will verbalize understanding of energy conservation techniques to increase independence and safety with IADLs. Baseline:  Goal status: in progress  3.  Pt will perform dynamic standing task for simple IADLS w/o LOB using DME and/or countertop support prn Baseline:  03/06/24: pt with 2 posterior LOB during standing task this session Goal status: in progress   LONG TERM GOALS: Target date: 03/29/24  Pt will complete pill box assessment with 1 or fewer errors for increased awareness with medication management. Baseline: nurse/caregiver completing managing meds 02/20/24: no errors, requiring 5:09 to complete Goal status: in progress  2.  Pt will demonstrate improved standing balance, tolerance, and awareness to complete laundry task at Mod I level. Baseline:  Goal status: in progress  3.  Pt will verbalize understanding of safe strategies and/or modifications to setup to increase safety with picking up items from ground. Baseline:  Goal status: in progress  4.  Patient will report at least two-point increase in  average PSFS score or at  least three-point increase in a single activity score indicating functionally significant improvement given minimum detectable change. Baseline: 2.3 Goal status: in progress   ASSESSMENT:  CLINICAL IMPRESSION: Patient is a 78 y.o. male who was seen today for occupational therapy treatment for cognitive, endurance, and balance deficits s/p frequent falls with concussion and spinal stenosis.  Pt demonstrating good attention during pill box assessment and demonstrating good sequencing.  Pt demonstrating LOB x2 during standing task, one time stating that he was distracted and the other time blaming it on increased weight shift.  OT providing pt with close supervision and cues for sequencing and weight shifting to carryover to functional IADLs.  OT educating on increasing participation in IADLs with supervision prior to progression away from full time supervision, as reporting that his nurse will be transitioning from full time care to 7-8 hrs/day and no weekends starting next week.  Pt will continue to benefit from skilled occupational therapy services to address balance, cognition, safety awareness, and introduction of compensatory strategies/AE prn to improve participation and safety during ADLs and IADLs.    PERFORMANCE DEFICITS: in functional skills including IADLs, flexibility, mobility, balance, body mechanics, endurance, decreased knowledge of precautions, and decreased knowledge of use of DME, cognitive skills including memory, safety awareness, and sequencing, and psychosocial skills including routines and behaviors.     PLAN:  OT FREQUENCY: 1x/week  OT DURATION: 6 weeks  PLANNED INTERVENTIONS: 97168 OT Re-evaluation, 97535 self care/ADL training, 02889 therapeutic exercise, 97530 therapeutic activity, 97112 neuromuscular re-education, balance training, functional mobility training, psychosocial skills training, energy conservation, coping strategies training,  patient/family education, and DME and/or AE instructions  RECOMMENDED OTHER SERVICES: PT  CONSULTED AND AGREED WITH PLAN OF CARE: Patient and nurse/caregiver  PLAN FOR NEXT SESSION: complete med management with personal med list, further educate on adaptive strategies for IADLs and setup of meds, dynamic standing activity   Okechukwu Regnier, OTR/L 03/06/2024, 2:54 PM  [facility] at [facility] [address], [address] [place], [place] [address] Phone # [phone] Fax # [phone]

## 2024-03-08 ENCOUNTER — Ambulatory Visit

## 2024-03-08 DIAGNOSIS — R2689 Other abnormalities of gait and mobility: Secondary | ICD-10-CM

## 2024-03-08 DIAGNOSIS — R2681 Unsteadiness on feet: Secondary | ICD-10-CM

## 2024-03-08 DIAGNOSIS — M6281 Muscle weakness (generalized): Secondary | ICD-10-CM

## 2024-03-08 DIAGNOSIS — R293 Abnormal posture: Secondary | ICD-10-CM

## 2024-03-08 NOTE — Therapy (Signed)
 OUTPATIENT PHYSICAL THERAPY NEURO TREATMENT   Patient Name: Miguel Williams MRN: [id number] DOB:[birth year], 78 y.o., male Today's Date: 03/08/2024   PCP: Shayne Anes, MD REFERRING PROVIDER: Emeline Joesph BROCKS, DO   END OF SESSION:  PT End of Session - 03/08/24 1108     Visit Number 5    Number of Visits 17    Date for Recertification  04/12/24    Authorization Type [employer] Medicare    PT Start Time 1100    PT Stop Time 1145    PT Time Calculation (min) 45 min    Equipment Utilized During Treatment Gait belt    Activity Tolerance Patient tolerated treatment well    Behavior During Therapy WFL for tasks assessed/performed            Past Medical History:  Diagnosis Date   Abdominal pain, lower    Agitation    Allergy, unspecified, initial encounter    Anxiety disorder    Arthritis    Asthma    As a child   Bipolar disorder (HCC)    Patient denies   Cancer (HCC)    Bladder   Cancer (HCC)    Skin cancer   Chronic kidney disease, stage III (moderate) (HCC)    Chronic kidney disease, stage III (moderate) (HCC)    COPD (chronic obstructive pulmonary disease) (HCC)    Pt denies (10/2023)   Coronary atherosclerosis of native coronary artery    Esophageal reflux    Fatigue    Fungal granuloma    Hip pain    History of 2019 novel coronavirus disease (COVID-19)    History of endoscopy 02/00/2011   History of kidney stones    Hypertension    Hypo-osmolality and hyponatremia    Hypokalemia    Hypothyroidism    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 now has 3oz of wine every other day (as of 10/2023)   Umbilical hernia    Unspecified fall, subsequent encounter    Vitamin D deficiency, unspecified    Wernicke's encephalopathy 06/23/2019   Past Surgical History:  Procedure Laterality Date   CATARACT EXTRACTION W/ INTRAOCULAR LENS IMPLANT Bilateral    COLONOSCOPY  04/2023   COLONOSCOPY W/ ENDOSCOPIC US      CYSTOSCOPY WITH FULGERATION N/A 03/09/2023   Procedure: CYSTOSCOPY WITH FULGERATION WITH CLOT EVACUATION;  Surgeon: Devere Lonni Righter, MD;  Location: [facility];  Service: Urology;  Laterality: N/A;   CYSTOSCOPY WITH URETHRAL DILATATION N/A 03/08/2023   Procedure: CYSTOSCOPY;  Surgeon: Devere Lonni Righter, MD;  Location: [facility];  Service: Urology;  Laterality: N/A;  30 MINUTES   ESOPHAGOGASTRODUODENOSCOPY     LUMBAR WOUND DEBRIDEMENT N/A 12/01/2023   Procedure: LUMBAR WOUND DEBRIDEMENT;  Surgeon: [name], MD;  Location: [facility];  Service: Neurosurgery;  Laterality: N/A;   TONSILLECTOMY  04/25/1950   Patient Active Problem List   Diagnosis Date Noted   Right cervical  radiculopathy 02/05/2024   Constipation 12/20/2023   Acute on chronic back pain 12/20/2023   Neuropathy 12/20/2023   Depression with anxiety 12/08/2023   Neurogenic claudication due to lumbar spinal stenosis 12/05/2023   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   Lumbar epidural mass (HCC) 12/01/2023   Spinal stenosis of lumbar region 11/15/2023   Family history of alcoholism 11/06/2023   Headache, occipital 11/06/2023   History of pancreatitis 11/06/2023   Hyperparathyroidism 11/06/2023   Hypertension 11/06/2023   Joint swelling 11/06/2023   Joint stiffness 11/06/2023   Laryngopharyngeal reflux 11/06/2023   Lumbar polyradiculopathy 11/06/2023   Memory loss 11/06/2023   Anemia, blood loss 11/06/2023   Symptomatic anemia 08/10/2023   Anemia 08/09/2023   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 11/27/2022   Urinary tract infection without hematuria 11/09/2022   Bladder tumor 09/12/2022   Nausea and vomiting 09/12/2022   Kidney stones 12/11/2021   Scalp laceration 07/01/2021   Wernicke's encephalopathy 06/23/2019   Chronic kidney disease, stage III (moderate) (HCC)    Hyperkalemia 04/12/2019   Chronic kidney disease due to hypertension 04/12/2019   Stage 3a chronic kidney disease (HCC) 04/12/2019   Alcohol  use, unspecified with unspecified alcohol -induced disorder 04/12/2019   Pyelonephritis 04/05/2019   Renal failure (ARF), acute on chronic 04/05/2019   Ureterolithiasis 04/05/2019   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   Presbycusis of both ears 05/27/2016   Hardening of the aorta (main artery of the heart) 02/15/2016   Neck pain 01/15/2016   Tremor 12/18/2015   Hearing loss of left ear 06/04/2015   Other specified diseases of blood and blood-forming organs 07/28/2014   Long term current use of therapeutic drug 09/09/2013   Hyperlipidemia 04/03/2013   Hemorrhoids 02/18/2013   History of infectious disease 02/18/2013   Gout 02/18/2013   Pain, dental 01/01/2013   Health maintenance examination 03/30/2011   Abnormal prostate exam 03/08/2011   Abnormal prostate specific antigen 03/08/2011   GASTROESOPHAGEAL REFLUX DISEASE, CHRONIC 12/24/2008   ACUTE BRONCHITIS 12/16/2008   Asthma with exacerbation 12/16/2008   GANGLION CYST, TENDON SHEATH 06/27/2008   FOOT PAIN, LEFT 05/23/2008   ADVERSE DRUG REACTION 03/10/2008   GOUT 03/08/2007   BPH/LUTS W/O OBSTRUCTION 03/08/2007   ELEVATED BP W/O HYPERTENSION 03/08/2007   INSOMNIA, CHRONIC, MILD 10/02/2006   CERUMEN IMPACTION, BILATERAL 10/02/2006    ONSET DATE: 11/15/2023  REFERRING DIAG: F51.937 (ICD-10-CM) - Neurogenic claudication due to lumbar spinal stenosis   THERAPY DIAG:  Other abnormalities of gait and mobility  Muscle weakness (generalized)  Unsteadiness on feet  Abnormal posture  Rationale  for Evaluation and Treatment: Rehabilitation  SUBJECTIVE:  SUBJECTIVE STATEMENT: Doing ok, no new issues. Feeling stronger  Pt accompanied by: NurseGLENWOOD Ivanoff  PERTINENT HISTORY: presented 11/30/23 with low back pain and weakness along with several falls since his elective PLIF L4-S1 with decompressive laminectomies on 11/15/23. Workup has revealed large what looks like epidural hematoma and wound hematoma. S/p reexploration of lumbar wound for evacuation of hematoma 8/8. PMH of anxiety, cancer, CKD III, HLD, HTN, hypothyroidism, neuropathy   PAIN:  Are you having pain? Yes: NPRS scale: 0/10 Pain location: LB Pain description: sore Aggravating factors: not leaning on walker Relieving factors: sitting down  PRECAUTIONS: Back and Fall  RED FLAGS: None   WEIGHT BEARING RESTRICTIONS: No  FALLS: Has patient fallen in last 6 months? Yes. Number of falls 5 *Last two falls were without walker LIVING ENVIRONMENT: Lives with: lives alone and has a nurse who is staying in home 24/7 Lives in: Other townhouse Stairs: 2 steps to enter with rails on B sides that he can reach, 2 story home but is able to remain on main floor with full bedroom/bathroom  Has following equipment at home: Single point cane, Walker - 2 wheeled, shower chair, bed side commode, Grab bars, and hand held shower head, transport wheelchair   PLOF: Independent and Independent with basic ADLs  PATIENT GOALS: Balance and walking  OBJECTIVE:    TODAY'S TREATMENT: 03/08/24 Activity Comments  Standing balance Static holds w/ unilateral weight. Mirror for visual feedback and verbal cues in LE engagement and hip control  Sidestepping  Alt stair taps X 2 min 3#  LAQ 3x10   Tandem forward/retrowalk Forward march/retrowalk X 2 min   Static  multisensory balance        TODAY'S TREATMENT: 03/06/24 Activity Comments  NU-step x 8 min resistance intervals 2 min warm-up 30 sec heavy; 60 sec light  Step-ups 2x10; forward/lateral HR, 8, cues in sequence LLE weakness evident  Seated hamstring curls 1x10 15#, 20#  Sit to stand negatives 5x5 10# goblet (no weight for sit to stand, 10# for stand to sit)  Gait training Trials w/ straight cane and CGA-min A; Trendelenberg L > R        TODAY'S TREATMENT: 02/26/24 Activity Comments  Nustep L5 x 6 min UEs/LEs  Dynamic warm up; cueing to maintain ~60 SPM, then 70SPM  review of HEP:  STS with green medball 10x mini squat 10x  standing  green TB rows 10x sitting paloff press TB rows 10x  Cues for slow descent, leading with hips rather than knees for squats, slight anterior pelvic tilt. PT provided CGA for standing rows   Sitting ant/pos pelvic tilts 10x  Demo and cueing required   standing ball toss with caregiver CGA; required sitting rest breaks after a few minutes of standing   standing passing red medball overhead, behind back  CGA; cueing to extend knees      HOME EXERCISE PROGRAM Access Code: [email or web address] URL: [email or web address] Date: 02/26/2024 Prepared by: [facility] - Outpatient  Rehab - [facility]  Program Notes perform standing activiites with supervision for safety   Exercises - Sit to Stand with Arms Crossed  - 1 x daily - 5 x weekly - 2 sets - 10 reps - Mini Squat with Counter Support  - 1 x daily - 5 x weekly - 2 sets - 10 reps - Standing Shoulder Row with Anchored Resistance  - 1 x daily - 5 x weekly - 2 sets - 10 reps - Seated Anti-Rotation Press With Anchored Resistance  -  1 x daily - 5 x weekly - 2 sets - 10 reps - Seated Pelvic Tilts  - 1 x daily - 5 x weekly - 2 sets - 10 reps   PATIENT EDUCATION: Education details: HEP update  Person educated: Patient and Nurse, Learning Disability method: Explanation, Demonstration, Tactile cues,  Verbal cues, and Handouts Education comprehension: verbalized understanding and returned demonstration    Note: Objective measures were completed at Evaluation unless otherwise noted.  DIAGNOSTIC FINDINGS: CT 01/31/24-no abnormality  COGNITION: Overall cognitive status: Within functional limits for tasks assessed   SENSATION: Light touch: Impaired  and diminished sensation RLE  POSTURE: rounded shoulders, forward head, and posterior pelvic tilt In standing, knees crouched shaky in unsupported standing  LOWER EXTREMITY ROM:   pt slumps into posterior pelvic tilt with ROM  Active  Right Eval Left Eval  Hip flexion    Hip extension    Hip abduction    Hip adduction    Hip internal rotation    Hip external rotation    Knee flexion    Knee extension -10 -20  Ankle dorsiflexion 10 15  Ankle plantarflexion    Ankle inversion    Ankle eversion     (Blank rows = not tested)  LOWER EXTREMITY MMT:    MMT Right Eval Left Eval  Hip flexion 4 5  Hip extension    Hip abduction 4 4  Hip adduction 5 5  Hip internal rotation    Hip external rotation    Knee flexion 5 5  Knee extension 4 5  Ankle dorsiflexion 3- 3+  Ankle plantarflexion    Ankle inversion    Ankle eversion    (Blank rows = not tested)  TRANSFERS: Sit to stand: SBA  Assistive device utilized: Environmental Consultant - 2 wheeled     Stand to sit: SBA  Assistive device utilized: Environmental Consultant - 2 wheeled      GAIT: Findings: Gait Characteristics: step through pattern, knee flexed in stance- Right, knee flexed in stance- Left, and wide BOS, Distance walked: 50 ft, Assistive device utilized:Walker - 2 wheeled, and Level of assistance: CGA  FUNCTIONAL TESTS:  5 times sit to stand: 17.28 Timed up and go (TUG): 24.97 sec 10 meter walk test: 18.28 sec (1.79 ft/sec) Berg Balance: 27/56                                                                                                                                   TREATMENT DATE:  02/16/2024    PATIENT EDUCATION: Education details: Eval results, POC Person educated: Patient and Nurse, adult Education method: Explanation Education comprehension: verbalized understanding  HOME EXERCISE PROGRAM: At home, he is standing at counter:  marching, hip kicks side, knee bends, heel raises  GOALS: Goals reviewed with patient? Yes  SHORT TERM GOALS: Target date: 03/15/2024  Pt will be independent with HEP for improved strength, balance, gait. Baseline: Goal status: IN PROGRESS  2.  Pt will improve 5x sit<>stand to less than or equal to 15 sec to demonstrate improved functional strength and transfer efficiency. Baseline: 17.28 sec Goal status: IN PROGRESS  3.  Pt will improve Berg score to at least 37/56 to decrease fall risk. Baseline: 27/56 Goal status: IN PROGRESS   LONG TERM GOALS: Target date: 04/12/2024  Pt will be independent with HEP for improved strength, balance, gait. Baseline:  Goal status: IN PROGRESS  2.  Pt will improve 5x sit<>stand to less than or equal to 12 sec to demonstrate improved functional strength and transfer efficiency. Baseline: 17.28 sec Goal status: IN PROGRESS  3.  Pt will improve Berg score to at least 48/56 to decrease fall risk. Baseline: 27/56 Goal status: IN PROGRESS  4.  Pt will improve TUG score to less than or equal to 13 sec for decreased fall risk. Baseline: 24.97 sec Goal status: IN PROGRESS  5.  Pt will improve gait velocity to at least 2.3 ft/sec for improved gait efficiency and safety. Baseline: 1.79 ft/sec Goal status:IN PROGRESS  6.  Pt will verbalize understanding of fall prevention in home environment.  Baseline:  Goal status: IN PROGRESS  ASSESSMENT:  CLINICAL IMPRESSION: Focus on neuromuscular re-ed for improve recruitment and coordination of core/trunk and pelvic girdle co-contraction for improved static stability w/ proximal control with improved self-correction using mirror and verbal cues  for feed-back correction. Generalized static activities to dynamic with same cues for core control and slower movements with visual in mirror to control Trendelenberg in midstance/single limb support progressing activities from BUE to no UE support to reinforce.  Tolerated session well and would benefit from ongoing services to improve mobility and reduce risk for falls.     OBJECTIVE IMPAIRMENTS: Abnormal gait, decreased balance, decreased mobility, difficulty walking, decreased strength, impaired flexibility, and postural dysfunction.   ACTIVITY LIMITATIONS: standing, transfers, and locomotion level  PARTICIPATION LIMITATIONS: meal prep, cleaning, laundry, shopping, community activity, and travel  PERSONAL FACTORS: 3+ comorbidities: see above are also affecting patient's functional outcome.   REHAB POTENTIAL: Good  CLINICAL DECISION MAKING: Evolving/moderate complexity  EVALUATION COMPLEXITY: Moderate  PLAN:  PT FREQUENCY: 2x/week  PT DURATION: 8 weeks plus eval  PLANNED INTERVENTIONS: 97750- Physical Performance Testing, 97110-Therapeutic exercises, 97530- Therapeutic activity, [id number]- Neuromuscular re-education, 97535- Self Care, [id number]- Manual therapy, [phone]- Gait training, Patient/Family education, and Balance training  PLAN FOR NEXT SESSION: review HEP and progress strengthening, balance, gait.     11:08 AM, 03/08/24 M. Kelly Brianna Bennett, PT, DPT Physical Therapist- [place] Office Number: [phone]

## 2024-03-08 NOTE — Therapy (Signed)
 OUTPATIENT PHYSICAL THERAPY NEURO TREATMENT   Patient Name: Miguel Williams MRN: [id number] DOB:October 19, 1945, 78 y.o., male Today's Date: 03/11/2024   PCP: Shayne Anes, MD REFERRING PROVIDER: Emeline Joesph BROCKS, DO   END OF SESSION:  PT End of Session - 03/11/24 1441     Visit Number 6    Number of Visits 17    Date for Recertification  04/12/24    Authorization Type [employer] Medicare    PT Start Time 1403    PT Stop Time 1443    PT Time Calculation (min) 40 min    Equipment Utilized During Treatment Gait belt    Activity Tolerance Patient tolerated treatment well    Behavior During Therapy WFL for tasks assessed/performed             Past Medical History:  Diagnosis Date   Abdominal pain, lower    Agitation    Allergy, unspecified, initial encounter    Anxiety disorder    Arthritis    Asthma    As a child   Bipolar disorder (HCC)    Patient denies   Cancer (HCC)    Bladder   Cancer (HCC)    Skin cancer   Chronic kidney disease, stage III (moderate) (HCC)    Chronic kidney disease, stage III (moderate) (HCC)    COPD (chronic obstructive pulmonary disease) (HCC)    Pt denies (10/2023)   Coronary atherosclerosis of native coronary artery    Esophageal reflux    Fatigue    Fungal granuloma    Hip pain    History of 2019 novel coronavirus disease (COVID-19)    History of endoscopy 02/00/2011   History of kidney stones    Hypertension    Hypo-osmolality and hyponatremia    Hypokalemia    Hypothyroidism    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 now has 3oz of wine every other day (as of 10/2023)   Umbilical hernia    Unspecified fall, subsequent encounter    Vitamin D deficiency, unspecified    Wernicke's encephalopathy 06/23/2019   Past Surgical History:  Procedure Laterality Date   CATARACT EXTRACTION W/ INTRAOCULAR LENS IMPLANT Bilateral    COLONOSCOPY  04/2023   COLONOSCOPY W/ ENDOSCOPIC US      CYSTOSCOPY WITH FULGERATION N/A 03/09/2023   Procedure: CYSTOSCOPY WITH FULGERATION WITH CLOT EVACUATION;  Surgeon: Devere Lonni Righter, MD;  Location: [facility];  Service: Urology;  Laterality: N/A;   CYSTOSCOPY WITH URETHRAL DILATATION N/A 03/08/2023   Procedure: CYSTOSCOPY;  Surgeon: Devere Lonni Righter, MD;  Location: [facility];  Service: Urology;  Laterality: N/A;  30 MINUTES   ESOPHAGOGASTRODUODENOSCOPY     LUMBAR WOUND DEBRIDEMENT N/A 12/01/2023   Procedure: LUMBAR WOUND DEBRIDEMENT;  Surgeon: [name], MD;  Location: [facility] OR;  Service: Neurosurgery;  Laterality: N/A;   TONSILLECTOMY  04/25/1950   Patient Active Problem List   Diagnosis Date Noted   Right  cervical radiculopathy 02/05/2024   Constipation 12/20/2023   Acute on chronic back pain 12/20/2023   Neuropathy 12/20/2023   Depression with anxiety 12/08/2023   Neurogenic claudication due to lumbar spinal stenosis 12/05/2023   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   Lumbar epidural mass (HCC) 12/01/2023   Spinal stenosis of lumbar region 11/15/2023   Family history of alcoholism 11/06/2023   Headache, occipital 11/06/2023   History of pancreatitis 11/06/2023   Hyperparathyroidism 11/06/2023   Hypertension 11/06/2023   Joint swelling 11/06/2023   Joint stiffness 11/06/2023   Laryngopharyngeal reflux 11/06/2023   Lumbar polyradiculopathy 11/06/2023   Memory loss 11/06/2023   Anemia, blood loss 11/06/2023   Symptomatic anemia 08/10/2023   Anemia 08/09/2023   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 11/27/2022   Urinary tract infection without hematuria 11/09/2022   Bladder tumor 09/12/2022   Nausea and vomiting 09/12/2022   Kidney stones 12/11/2021   Scalp laceration 07/01/2021   Wernicke's encephalopathy 06/23/2019   Chronic kidney disease, stage III (moderate) (HCC)    Hyperkalemia 04/12/2019   Chronic kidney disease due to hypertension 04/12/2019   Stage 3a chronic kidney disease (HCC) 04/12/2019   Alcohol  use, unspecified with unspecified alcohol -induced disorder 04/12/2019   Pyelonephritis 04/05/2019   Renal failure (ARF), acute on chronic 04/05/2019   Ureterolithiasis 04/05/2019   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   Presbycusis of both ears 05/27/2016   Hardening of the aorta (main artery of the heart) 02/15/2016   Neck pain 01/15/2016   Tremor 12/18/2015   Hearing loss of left ear 06/04/2015   Other specified diseases of blood and blood-forming organs 07/28/2014   Long term current use of therapeutic drug 09/09/2013   Hyperlipidemia 04/03/2013   Hemorrhoids 02/18/2013   History of infectious disease 02/18/2013   Gout 02/18/2013   Pain, dental 01/01/2013   Health maintenance examination 03/30/2011   Abnormal prostate exam 03/08/2011   Abnormal prostate specific antigen 03/08/2011   GASTROESOPHAGEAL REFLUX DISEASE, CHRONIC 12/24/2008   ACUTE BRONCHITIS 12/16/2008   Asthma with exacerbation 12/16/2008   GANGLION CYST, TENDON SHEATH 06/27/2008   FOOT PAIN, LEFT 05/23/2008   ADVERSE DRUG REACTION 03/10/2008   GOUT 03/08/2007   BPH/LUTS W/O OBSTRUCTION 03/08/2007   ELEVATED BP W/O HYPERTENSION 03/08/2007   INSOMNIA, CHRONIC, MILD 10/02/2006   CERUMEN IMPACTION, BILATERAL 10/02/2006    ONSET DATE: 11/15/2023  REFERRING DIAG: F51.937 (ICD-10-CM) - Neurogenic claudication due to lumbar spinal stenosis   THERAPY DIAG:  Other abnormalities of gait and mobility  Muscle weakness (generalized)  Unsteadiness on feet  Abnormal  posture  Rationale for Evaluation and Treatment: Rehabilitation  SUBJECTIVE:  SUBJECTIVE STATEMENT: Feeling stronger every day. Forgot the walker near the car.   Pt accompanied by: NurseGLENWOOD Ivanoff  PERTINENT HISTORY: presented 11/30/23 with low back pain and weakness along with several falls since his elective PLIF L4-S1 with decompressive laminectomies on 11/15/23. Workup has revealed large what looks like epidural hematoma and wound hematoma. S/p reexploration of lumbar wound for evacuation of hematoma 8/8. PMH of anxiety, cancer, CKD III, HLD, HTN, hypothyroidism, neuropathy   PAIN:  Are you having pain? Yes: NPRS scale: 3/10 Pain location: LB Pain description: sore Aggravating factors: not leaning on walker Relieving factors: sitting down  PRECAUTIONS: Back and Fall  RED FLAGS: None   WEIGHT BEARING RESTRICTIONS: No  FALLS: Has patient fallen in last 6 months? Yes. Number of falls 5 *Last two falls were without walker LIVING ENVIRONMENT: Lives with: lives alone and has a nurse who is staying in home 24/7 Lives in: Other townhouse Stairs: 2 steps to enter with rails on B sides that he can reach, 2 story home but is able to remain on main floor with full bedroom/bathroom  Has following equipment at home: Single point cane, Walker - 2 wheeled, shower chair, bed side commode, Grab bars, and hand held shower head, transport wheelchair   PLOF: Independent and Independent with basic ADLs  PATIENT GOALS: Balance and walking  OBJECTIVE:     TODAY'S TREATMENT: 03/11/24 Activity Comments  Nustep L5 x 6 min LEs only  Cueing to maintain 75SPM  Stair navigation with B handrails  Mild instability with single handrail  Alt step ups 8 with 1 handrail Good stability; cueing to verbally call out sequencing  to keep alt pattern   in stair rail: alt forward step alt backward step sidestepping over hurdle staggered ant/pos wt shift staggered stance balance 1/2 turns (able to wean UE support) Occasional min A to prevent posterior LOB which improved with practice as pt was able to correct wt shift. Marked R hip weakness causes L hip drop when stepping      PATIENT EDUCATION: Education details: discussed pt's stairs at home and caregiver's concern about pt's safety on stairs at his home/brother's home; advised that pt should still have someone with him for safety with stairs  Person educated: Patient and Caregiver Carol Education method: Explanation and Demonstration Education comprehension: verbalized understanding and returned demonstration     HOME EXERCISE PROGRAM Access Code: [email or web address] URL: [email or web address] Date: 02/26/2024 Prepared by: [facility] - Outpatient  Rehab - [facility]  Program Notes perform standing activiites with supervision for safety   Exercises - Sit to Stand with Arms Crossed  - 1 x daily - 5 x weekly - 2 sets - 10 reps - Mini Squat with Counter Support  - 1 x daily - 5 x weekly - 2 sets - 10 reps - Standing Shoulder Row with Anchored Resistance  - 1 x daily - 5 x weekly - 2 sets - 10 reps - Seated Anti-Rotation Press With Anchored Resistance  - 1 x daily - 5 x weekly - 2 sets - 10 reps - Seated Pelvic Tilts  - 1 x daily - 5 x weekly - 2 sets - 10 reps    Note: Objective measures were completed at Evaluation unless otherwise noted.  DIAGNOSTIC FINDINGS: CT 01/31/24-no abnormality  COGNITION: Overall cognitive status: Within functional limits for tasks assessed   SENSATION: Light touch: Impaired  and diminished sensation RLE  POSTURE: rounded shoulders, forward head, and posterior pelvic tilt In standing, knees crouched  shaky in unsupported standing  LOWER EXTREMITY ROM:   pt slumps into posterior pelvic tilt with ROM  Active   Right Eval Left Eval  Hip flexion    Hip extension    Hip abduction    Hip adduction    Hip internal rotation    Hip external rotation    Knee flexion    Knee extension -10 -20  Ankle dorsiflexion 10 15  Ankle plantarflexion    Ankle inversion    Ankle eversion     (Blank rows = not tested)  LOWER EXTREMITY MMT:    MMT Right Eval Left Eval  Hip flexion 4 5  Hip extension    Hip abduction 4 4  Hip adduction 5 5  Hip internal rotation    Hip external rotation    Knee flexion 5 5  Knee extension 4 5  Ankle dorsiflexion 3- 3+  Ankle plantarflexion    Ankle inversion    Ankle eversion    (Blank rows = not tested)  TRANSFERS: Sit to stand: SBA  Assistive device utilized: Environmental Consultant - 2 wheeled     Stand to sit: SBA  Assistive device utilized: Environmental Consultant - 2 wheeled      GAIT: Findings: Gait Characteristics: step through pattern, knee flexed in stance- Right, knee flexed in stance- Left, and wide BOS, Distance walked: 50 ft, Assistive device utilized:Walker - 2 wheeled, and Level of assistance: CGA  FUNCTIONAL TESTS:  5 times sit to stand: 17.28 Timed up and go (TUG): 24.97 sec 10 meter walk test: 18.28 sec (1.79 ft/sec) Berg Balance: 27/56                                                                                                                                   TREATMENT DATE: 02/16/2024    PATIENT EDUCATION: Education details: Eval results, POC Person educated: Patient and Nurse, adult Education method: Explanation Education comprehension: verbalized understanding  HOME EXERCISE PROGRAM: At home, he is standing at counter:  marching, hip kicks side, knee bends, heel raises  GOALS: Goals reviewed with patient? Yes  SHORT TERM GOALS: Target date: 03/15/2024  Pt will be independent with HEP for improved strength, balance, gait. Baseline: Goal status: IN PROGRESS  2.  Pt will improve 5x sit<>stand to less than or equal to 15 sec to demonstrate  improved functional strength and transfer efficiency. Baseline: 17.28 sec Goal status: IN PROGRESS  3.  Pt will improve Berg score to at least 37/56 to decrease fall risk. Baseline: 27/56 Goal status: IN PROGRESS   LONG TERM GOALS: Target date: 04/12/2024  Pt will be independent with HEP for improved strength, balance, gait. Baseline:  Goal status: IN PROGRESS  2.  Pt will improve 5x sit<>stand to less than or equal to 12 sec to demonstrate improved functional strength and transfer efficiency. Baseline: 17.28 sec Goal status: IN PROGRESS  3.  Pt will improve Berg score to  at least 48/56 to decrease fall risk. Baseline: 27/56 Goal status: IN PROGRESS  4.  Pt will improve TUG score to less than or equal to 13 sec for decreased fall risk. Baseline: 24.97 sec Goal status: IN PROGRESS  5.  Pt will improve gait velocity to at least 2.3 ft/sec for improved gait efficiency and safety. Baseline: 1.79 ft/sec Goal status:IN PROGRESS  6.  Pt will verbalize understanding of fall prevention in home environment.  Baseline:  Goal status: IN PROGRESS  ASSESSMENT:  CLINICAL IMPRESSION: Patient arrived to session with report of mild LBP. Session focused on dynamic balance challenges. Marked improvement evident in pt's dynamic balance today with good ability to correct posterior LOB after practice. R hip weakness results in L hip drop which contributes imbalance. Provided short sitting rest breaks in between activities while providing feedback on activities, but pt's endurance is improving. Patient tolerated session well and without complaints at end of appointment.    OBJECTIVE IMPAIRMENTS: Abnormal gait, decreased balance, decreased mobility, difficulty walking, decreased strength, impaired flexibility, and postural dysfunction.   ACTIVITY LIMITATIONS: standing, transfers, and locomotion level  PARTICIPATION LIMITATIONS: meal prep, cleaning, laundry, shopping, community activity, and  travel  PERSONAL FACTORS: 3+ comorbidities: see above are also affecting patient's functional outcome.   REHAB POTENTIAL: Good  CLINICAL DECISION MAKING: Evolving/moderate complexity  EVALUATION COMPLEXITY: Moderate  PLAN:  PT FREQUENCY: 2x/week  PT DURATION: 8 weeks plus eval  PLANNED INTERVENTIONS: 97750- Physical Performance Testing, 97110-Therapeutic exercises, 97530- Therapeutic activity, [id number]- Neuromuscular re-education, 97535- Self Care, [id number]- Manual therapy, [phone]- Gait training, Patient/Family education, and Balance training  PLAN FOR NEXT SESSION: review HEP and progress strengthening, balance, gait.     Louana Terrilyn Christians, PT, DPT 03/11/24 2:44 PM  [facility] [address] [place], [address] [place], [place] [address] Phone # [phone] Fax # [phone]

## 2024-03-11 ENCOUNTER — Encounter: Payer: Self-pay | Admitting: Physical Therapy

## 2024-03-11 ENCOUNTER — Ambulatory Visit: Admitting: Physical Therapy

## 2024-03-11 DIAGNOSIS — R2689 Other abnormalities of gait and mobility: Secondary | ICD-10-CM

## 2024-03-11 DIAGNOSIS — M6281 Muscle weakness (generalized): Secondary | ICD-10-CM | POA: Diagnosis not present

## 2024-03-11 DIAGNOSIS — R2681 Unsteadiness on feet: Secondary | ICD-10-CM

## 2024-03-11 DIAGNOSIS — R293 Abnormal posture: Secondary | ICD-10-CM

## 2024-03-13 ENCOUNTER — Ambulatory Visit

## 2024-03-13 DIAGNOSIS — R29898 Other symptoms and signs involving the musculoskeletal system: Secondary | ICD-10-CM

## 2024-03-13 DIAGNOSIS — R2681 Unsteadiness on feet: Secondary | ICD-10-CM

## 2024-03-13 DIAGNOSIS — M6281 Muscle weakness (generalized): Secondary | ICD-10-CM

## 2024-03-13 DIAGNOSIS — R293 Abnormal posture: Secondary | ICD-10-CM

## 2024-03-13 DIAGNOSIS — R2689 Other abnormalities of gait and mobility: Secondary | ICD-10-CM

## 2024-03-13 NOTE — Therapy (Signed)
 OUTPATIENT OCCUPATIONAL THERAPY NEURO  Treatment Note  Patient Name: Miguel Williams MRN: [id number] DOB:10-07-1945, 78 y.o., male Today's Date: 03/13/2024  PCP: Shayne Anes, MD REFERRING PROVIDER: Emeline Joesph BROCKS, DO  END OF SESSION:  OT End of Session - 03/13/24 1642     Visit Number 5    Number of Visits 7    Date for Recertification  03/29/24    Authorization Type [employer] 2025    Authorization Time Period Auth#: [id number] , approved 6 OT visits from 02/13/2024-04/09/2024    Authorization - Visit Number 4    Authorization - Number of Visits 6    OT Start Time 1316    OT Stop Time 1357    OT Time Calculation (min) 41 min    Equipment Utilized During Treatment bean bags, rings and ring toss    Activity Tolerance Patient tolerated treatment well    Behavior During Therapy WFL for tasks assessed/performed              Past Medical History:  Diagnosis Date   Abdominal pain, lower    Agitation    Allergy, unspecified, initial encounter    Anxiety disorder    Arthritis    Asthma    As a child   Bipolar disorder (HCC)    Patient denies   Cancer (HCC)    Bladder   Cancer (HCC)    Skin cancer   Chronic kidney disease, stage III (moderate) (HCC)    Chronic kidney disease, stage III (moderate) (HCC)    COPD (chronic obstructive pulmonary disease) (HCC)    Pt denies (10/2023)   Coronary atherosclerosis of native coronary artery    Esophageal reflux    Fatigue    Fungal granuloma    Hip pain    History of 2019 novel coronavirus disease (COVID-19)    History of endoscopy 02/00/2011   History of kidney stones    Hypertension    Hypo-osmolality and hyponatremia    Hypokalemia    Hypothyroidism    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 now has 3oz of wine every other day (as of 10/2023)   Umbilical hernia    Unspecified fall, subsequent encounter    Vitamin D deficiency, unspecified    Wernicke's encephalopathy 06/23/2019   Past Surgical History:  Procedure Laterality Date   CATARACT EXTRACTION W/ INTRAOCULAR LENS IMPLANT Bilateral    COLONOSCOPY  04/2023   COLONOSCOPY W/ ENDOSCOPIC US      CYSTOSCOPY WITH FULGERATION N/A 03/09/2023   Procedure: CYSTOSCOPY WITH FULGERATION WITH CLOT EVACUATION;  Surgeon: Devere Lonni Righter, MD;  Location: [facility];  Service: Urology;  Laterality: N/A;   CYSTOSCOPY WITH URETHRAL DILATATION N/A 03/08/2023   Procedure: CYSTOSCOPY;  Surgeon: Devere Lonni Righter, MD;  Location: [facility];  Service: Urology;  Laterality: N/A;  30 MINUTES   ESOPHAGOGASTRODUODENOSCOPY     LUMBAR WOUND DEBRIDEMENT N/A 12/01/2023   Procedure:  LUMBAR WOUND DEBRIDEMENT;  Surgeon: [name], MD;  Location: [facility] OR;  Service: Neurosurgery;  Laterality: N/A;   TONSILLECTOMY  04/25/1950   Patient Active Problem List   Diagnosis Date Noted   Right cervical radiculopathy 02/05/2024   Constipation 12/20/2023   Acute on chronic back pain 12/20/2023   Neuropathy 12/20/2023   Depression with anxiety 12/08/2023   Neurogenic claudication due to lumbar spinal stenosis 12/05/2023   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   Lumbar epidural mass (HCC) 12/01/2023   Spinal stenosis of lumbar region 11/15/2023   Family history of alcoholism 11/06/2023   Headache, occipital 11/06/2023   History of pancreatitis 11/06/2023   Hyperparathyroidism 11/06/2023   Hypertension 11/06/2023   Joint swelling 11/06/2023   Joint stiffness 11/06/2023    Laryngopharyngeal reflux 11/06/2023   Lumbar polyradiculopathy 11/06/2023   Memory loss 11/06/2023   Anemia, blood loss 11/06/2023   Symptomatic anemia 08/10/2023   Anemia 08/09/2023   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 11/27/2022   Urinary tract infection without hematuria 11/09/2022   Bladder tumor 09/12/2022   Nausea and vomiting 09/12/2022   Kidney stones 12/11/2021   Scalp laceration 07/01/2021   Wernicke's encephalopathy 06/23/2019   Chronic kidney disease, stage III (moderate) (HCC)    Hyperkalemia 04/12/2019   Chronic kidney disease due to hypertension 04/12/2019   Stage 3a chronic kidney disease (HCC) 04/12/2019   Alcohol  use, unspecified with unspecified alcohol -induced disorder 04/12/2019   Pyelonephritis 04/05/2019   Renal failure (ARF), acute on chronic 04/05/2019   Ureterolithiasis 04/05/2019   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   Presbycusis of both ears 05/27/2016   Hardening of the aorta (main artery of the heart) 02/15/2016   Neck pain 01/15/2016   Tremor 12/18/2015   Hearing loss of left ear 06/04/2015   Other specified diseases of blood and blood-forming organs 07/28/2014   Long term current use of therapeutic drug 09/09/2013   Hyperlipidemia 04/03/2013   Hemorrhoids 02/18/2013   History of infectious disease 02/18/2013   Gout 02/18/2013   Pain, dental 01/01/2013   Health maintenance examination 03/30/2011   Abnormal prostate exam 03/08/2011   Abnormal prostate specific antigen 03/08/2011   GASTROESOPHAGEAL REFLUX DISEASE, CHRONIC 12/24/2008   ACUTE BRONCHITIS 12/16/2008   Asthma with exacerbation 12/16/2008   GANGLION CYST, TENDON SHEATH 06/27/2008   FOOT PAIN, LEFT 05/23/2008   ADVERSE DRUG REACTION 03/10/2008   GOUT 03/08/2007   BPH/LUTS W/O OBSTRUCTION 03/08/2007   ELEVATED BP W/O HYPERTENSION 03/08/2007   INSOMNIA, CHRONIC, MILD 10/02/2006   CERUMEN IMPACTION, BILATERAL 10/02/2006    ONSET DATE: 11/15/23  REFERRING DIAG: F51.937  (ICD-10-CM) - Neurogenic claudication due to lumbar spinal stenosis  THERAPY DIAG:  Muscle weakness (generalized)  Other symptoms and signs involving the musculoskeletal system  Unsteadiness on feet  Rationale for Evaluation and Treatment: Rehabilitation  SUBJECTIVE:   SUBJECTIVE STATEMENT: Pt reports that he has not had any falls and that so far things are going well with reduced caregiver hours.  Pt accompanied by: self and caregiver [name]  PERTINENT HISTORY: presented 11/30/23 with low back pain and weakness along with several falls since his elective PLIF L4-S1 with decompressive laminectomies on 11/15/23. Workup has revealed large what looks like epidural hematoma and wound hematoma. S/p reexploration of lumbar wound for evacuation of hematoma 8/8. PMH of anxiety, cancer, CKD III, HLD, HTN, hypothyroidism  PRECAUTIONS: Back  WEIGHT BEARING RESTRICTIONS: No  PAIN:  Are you having pain? Yes: NPRS scale: 3/10 Pain location: lower back Pain description: achy,  dull Aggravating factors: twisting Relieving factors: rest  FALLS: Has patient fallen in last 6 months? Yes. Number of falls 4-6  LIVING ENVIRONMENT: Lives with: lives alone - has a engineer, civil (consulting) who is staying there 24/7 Lives in: Other townhouse Stairs: 2 steps to enter with rails on B sides that he can reach, 2 story home but is able to remain on main floor with full bedroom/bathroom Has following equipment at home: Single point cane, Walker - 2 wheeled, shower chair, bed side commode, Grab bars, and hand held shower head, transport wheelchair  PLOF: Independent and Independent with basic ADLs  PATIENT GOALS: walking better, to move to [place]  to live near sisters  OBJECTIVE:  Note: Objective measures were completed at Evaluation unless otherwise noted.  HAND DOMINANCE: Left  ADLs: Transfers/ambulation related to ADLs: using RW with all mobility Eating: typically will order door dash for dinner Grooming: Mod  I UB Dressing: Mod I LB Dressing: Mod I Toileting: Mod I Bathing: Mod I Tub Shower transfers: Mod I with grab bar Equipment: Grab bars, Walk in shower, bed side commode, Reacher, and Long handled sponge  IADLs: Light housekeeping: nurse is completing all Meal Prep: orders out, nurse will cook, will fix his own coffee in [employer] mobility: no driving Medication management: nurse fills his pillboxes   MOBILITY STATUS: Hx of falls; min cues to remain within RW when ambulating  POSTURE COMMENTS:  rounded shoulders and forward head  ACTIVITY TOLERANCE: Activity tolerance: decreased endurance and balance  FUNCTIONAL OUTCOME MEASURES: PSFS: 2.3   UPPER EXTREMITY ROM:  WFL   UPPER EXTREMITY MMT:   5/5  COGNITION: Overall cognitive status: Impaired; pt and nurse/caregiver reporting decreased memory and sequencing                                                                                                                             TREATMENT DATE:  03/13/24 Educated in energy conservation techniques. See Pt instructions for details. Re-assessed pill box test, 100% accuracy with 7 minutes (pt was distracted this date talking to OT) Pt completed dynamic standing balance tasks including ring toss and bean bags toss completing cross midline reach with BUE to hone improved functional mobility. Finally completed cross midline reach stacking and unstacking cones at cabinet to simulate putting away dishes.  03/06/24  Pt completed pill box test with 100% accuracy, in 4:53 minutes.  Pt demonstrates good multi-tasking and probelm solving throughout task.  Pt dropping 1 pills onto floor, discussed how he would deal with that if that were to happen at home without assist. Functional cognition further assessed with The Pillbox Test: A Measure of Executive Functioning and Estimate of Medication Management. A straight pass/fail designation is determined by 3 or more errors of omission  or misplacement on the task. The pt completed the test with 0 errors. Dynamic standing: engaged in picking up clothespins on L lower surface with LUE and then RUE to incorporate increased trunk  rotation, placing on vertical dowel at midline in standing.  Pt with one LOB when reaching across midline with RUE, requiring backwards step and min assist to correct.  Pt taking seated rest break after 5 mins of standing.  Pt then reaching with LUE across midline to clothespins on R and then placing on vertical dowel at midline.  Pt demonstrating improved trunk rotation to R and balance, however with 1 LOB but able to correct without assistance.  OT educating on functional carryover to laundry task and emptying dishwasher.     02/26/24 Medication: pt's nurse/caregiver reporting that she has already filled his medication for the month. Attention: Engaged in small peg board pattern replication with use of key to identify correlating colors, challenging alternating attention, sequencing, and awareness of errors. Pt scanning in linear fashion with good sequencing and attention to task.  OT educating on use of line follow with bookmark if needing increased focus. Educated on functional carryover of task implications with systematic process to minimize onset of errors with medication or other routine tasks. Pipe tree activity: pt sorting PVC pipe pieces based on pattern prior to putting together, based on instructions, pt then making one error requiring mod cues to recognize.  Pt also still reaching into container, despite removing needed pieces.  Therefore OT educating on modifying setup to allow for decreased distraction.  OT again educating on functional carryover of task.    02/20/24 Pt completed pill box test with 100% accuracy, in 5:09 minutes.  Pt demonstrates good multi-tasking and probelm solving throughout task.  Pt demonstrating fair sequencing after dispensing meds, however educated on alterative technique  to allow for increased recall, as pt going back to look at 2 pill bottles to ensure that he had already dispensed those.  OT educating on potential errors and impact of taking medications incorrectly. The Pillbox Test: A Measure of Executive Functioning and Estimate of Medication Management. A straight pass/fail designation is determined by 3 or more errors of omission or misplacement on the task and completion in under 5 mins. The pt completed the test with 0 errors. Medication management: OT educating pt and nurse/caregiver on variety of pill boxes and pill map to aid in sequencing and recall of medications. Pt to bring in medication list next visit to complete assessment with personal meds. Driving: pt asking questions about returning to driving.  OT deferring specific questions to MD, however reporting that OTs can work on various tasks required for driving.  OT educating nurse/caregiver and pt on local drivers rehab options.   PATIENT EDUCATION: Education details: medication management, attention Person educated: Patient and Corporate Treasurer Education method: Explanation Education comprehension: verbalized understanding and needs further education  HOME EXERCISE PROGRAM: TBD   GOALS: Goals reviewed with patient? Yes  SHORT TERM GOALS: Target date: 03/08/24  Pt will verbalize understanding of task modifications and/or potential A/E needs to increase ease, safety, and independence w/ ADLs and IADLs Baseline: nurse/caregiver completing all IADLs 03/06/24: pt reporting nurse/caregiver to transition to 7-8 hour work days and no weekends starting next week, educated on recommendation to start attempting tasks with supervision that he will need to complete when she is not around.  Goal status: in progress  2.  Pt will verbalize understanding of energy conservation techniques to increase independence and safety with IADLs. Baseline:  Goal status: in progress  3.  Pt will perform dynamic  standing task for simple IADLS w/o LOB using DME and/or countertop support prn Baseline:  03/06/24: pt with  2 posterior LOB during standing task this session Goal status: in progress   LONG TERM GOALS: Target date: 03/29/24  Pt will complete pill box assessment with 1 or fewer errors for increased awareness with medication management. Baseline: nurse/caregiver completing managing meds 02/20/24: no errors, requiring 5:09 to complete Goal status: in progress  2.  Pt will demonstrate improved standing balance, tolerance, and awareness to complete laundry task at Mod I level. Baseline:  Goal status: in progress  3.  Pt will verbalize understanding of safe strategies and/or modifications to setup to increase safety with picking up items from ground. Baseline:  Goal status: in progress  4.  Patient will report at least two-point increase in average PSFS score or at least three-point increase in a single activity score indicating functionally significant improvement given minimum detectable change. Baseline: 2.3 Goal status: in progress   ASSESSMENT:  CLINICAL IMPRESSION: Patient is a 78 y.o. male who was seen today for occupational therapy treatment for cognitive, endurance, and balance deficits s/p frequent falls with concussion and spinal stenosis.Pt participating well with standing balance activities and demonstrates continued accuracy with med mgmt, however was distracted this date requiring increased time. Pt will continue to benefit from skilled occupational therapy services to address balance, cognition, safety awareness, and introduction of compensatory strategies/AE prn to improve participation and safety during ADLs and IADLs.    PERFORMANCE DEFICITS: in functional skills including IADLs, flexibility, mobility, balance, body mechanics, endurance, decreased knowledge of precautions, and decreased knowledge of use of DME, cognitive skills including memory, safety awareness, and  sequencing, and psychosocial skills including routines and behaviors.     PLAN:  OT FREQUENCY: 1x/week  OT DURATION: 6 weeks  PLANNED INTERVENTIONS: 97168 OT Re-evaluation, 97535 self care/ADL training, 02889 therapeutic exercise, 97530 therapeutic activity, 97112 neuromuscular re-education, balance training, functional mobility training, psychosocial skills training, energy conservation, coping strategies training, patient/family education, and DME and/or AE instructions  RECOMMENDED OTHER SERVICES: PT  CONSULTED AND AGREED WITH PLAN OF CARE: Patient and nurse/caregiver  PLAN FOR NEXT SESSION: complete med management with personal med list, further educate on adaptive strategies for IADLs and setup of meds, dynamic standing activities, f/u energy conservation  Rocky Dutch, OTR/L 03/13/2024, 4:43 PM  [facility] [address], [address] [place], [place] [address] Phone # [phone] Fax # [phone]

## 2024-03-13 NOTE — Therapy (Signed)
 OUTPATIENT PHYSICAL THERAPY NEURO TREATMENT   Patient Name: Miguel Williams MRN: [id number] DOB:28-Sep-1945, 78 y.o., male Today's Date: 03/13/2024   PCP: Shayne Anes, MD REFERRING PROVIDER: Emeline Joesph BROCKS, DO   END OF SESSION:  PT End of Session - 03/13/24 1403     Visit Number 7    Number of Visits 17    Date for Recertification  04/12/24    Authorization Type [employer] Medicare    PT Start Time 1403    PT Stop Time 1445    PT Time Calculation (min) 42 min    Equipment Utilized During Treatment Gait belt    Activity Tolerance Patient tolerated treatment well    Behavior During Therapy WFL for tasks assessed/performed             Past Medical History:  Diagnosis Date   Abdominal pain, lower    Agitation    Allergy, unspecified, initial encounter    Anxiety disorder    Arthritis    Asthma    As a child   Bipolar disorder (HCC)    Patient denies   Cancer (HCC)    Bladder   Cancer (HCC)    Skin cancer   Chronic kidney disease, stage III (moderate) (HCC)    Chronic kidney disease, stage III (moderate) (HCC)    COPD (chronic obstructive pulmonary disease) (HCC)    Pt denies (10/2023)   Coronary atherosclerosis of native coronary artery    Esophageal reflux    Fatigue    Fungal granuloma    Hip pain    History of 2019 novel coronavirus disease (COVID-19)    History of endoscopy 02/00/2011   History of kidney stones    Hypertension    Hypo-osmolality and hyponatremia    Hypokalemia    Hypothyroidism    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 now has 3oz of wine every other day (as of 10/2023)   Umbilical hernia    Unspecified fall, subsequent encounter    Vitamin D deficiency, unspecified    Wernicke's encephalopathy 06/23/2019   Past Surgical History:  Procedure Laterality Date   CATARACT EXTRACTION W/ INTRAOCULAR LENS IMPLANT Bilateral    COLONOSCOPY  04/2023   COLONOSCOPY W/ ENDOSCOPIC US      CYSTOSCOPY WITH FULGERATION N/A 03/09/2023   Procedure: CYSTOSCOPY WITH FULGERATION WITH CLOT EVACUATION;  Surgeon: Devere Lonni Righter, MD;  Location[facility];  Service: Urology;  Laterality: N/A;   CYSTOSCOPY WITH URETHRAL DILATATION N/A 03/08/2023   Procedure: CYSTOSCOPY;  Surgeon: Devere Lonni Righter, MD;  Location: [facility];  Service: Urology;  Laterality: N/A;  30 MINUTES   ESOPHAGOGASTRODUODENOSCOPY     LUMBAR WOUND DEBRIDEMENT N/A 12/01/2023   Procedure: LUMBAR WOUND DEBRIDEMENT;  Surgeon: [name], MD;  Location: [facility] OR;  Service: Neurosurgery;  Laterality: N/A;   TONSILLECTOMY  04/25/1950   Patient Active Problem List   Diagnosis Date Noted   Right  cervical radiculopathy 02/05/2024   Constipation 12/20/2023   Acute on chronic back pain 12/20/2023   Neuropathy 12/20/2023   Depression with anxiety 12/08/2023   Neurogenic claudication due to lumbar spinal stenosis 12/05/2023   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   Lumbar epidural mass (HCC) 12/01/2023   Spinal stenosis of lumbar region 11/15/2023   Family history of alcoholism 11/06/2023   Headache, occipital 11/06/2023   History of pancreatitis 11/06/2023   Hyperparathyroidism 11/06/2023   Hypertension 11/06/2023   Joint swelling 11/06/2023   Joint stiffness 11/06/2023   Laryngopharyngeal reflux 11/06/2023   Lumbar polyradiculopathy 11/06/2023   Memory loss 11/06/2023   Anemia, blood loss 11/06/2023   Symptomatic anemia 08/10/2023   Anemia 08/09/2023   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 11/27/2022   Urinary tract infection without hematuria 11/09/2022   Bladder tumor 09/12/2022   Nausea and vomiting 09/12/2022   Kidney stones 12/11/2021   Scalp laceration 07/01/2021   Wernicke's encephalopathy 06/23/2019   Chronic kidney disease, stage III (moderate) (HCC)    Hyperkalemia 04/12/2019   Chronic kidney disease due to hypertension 04/12/2019   Stage 3a chronic kidney disease (HCC) 04/12/2019   Alcohol  use, unspecified with unspecified alcohol -induced disorder 04/12/2019   Pyelonephritis 04/05/2019   Renal failure (ARF), acute on chronic 04/05/2019   Ureterolithiasis 04/05/2019   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   Presbycusis of both ears 05/27/2016   Hardening of the aorta (main artery of the heart) 02/15/2016   Neck pain 01/15/2016   Tremor 12/18/2015   Hearing loss of left ear 06/04/2015   Other specified diseases of blood and blood-forming organs 07/28/2014   Long term current use of therapeutic drug 09/09/2013   Hyperlipidemia 04/03/2013   Hemorrhoids 02/18/2013   History of infectious disease 02/18/2013   Gout 02/18/2013   Pain, dental 01/01/2013   Health maintenance examination 03/30/2011   Abnormal prostate exam 03/08/2011   Abnormal prostate specific antigen 03/08/2011   GASTROESOPHAGEAL REFLUX DISEASE, CHRONIC 12/24/2008   ACUTE BRONCHITIS 12/16/2008   Asthma with exacerbation 12/16/2008   GANGLION CYST, TENDON SHEATH 06/27/2008   FOOT PAIN, LEFT 05/23/2008   ADVERSE DRUG REACTION 03/10/2008   GOUT 03/08/2007   BPH/LUTS W/O OBSTRUCTION 03/08/2007   ELEVATED BP W/O HYPERTENSION 03/08/2007   INSOMNIA, CHRONIC, MILD 10/02/2006   CERUMEN IMPACTION, BILATERAL 10/02/2006    ONSET DATE: 11/15/2023  REFERRING DIAG: F51.937 (ICD-10-CM) - Neurogenic claudication due to lumbar spinal stenosis   THERAPY DIAG:  Other abnormalities of gait and mobility  Muscle weakness (generalized)  Unsteadiness on feet  Abnormal  posture  Rationale for Evaluation and Treatment: Rehabilitation  SUBJECTIVE:  SUBJECTIVE STATEMENT: Went walking around without the walker  Pt accompanied by: NurseGLENWOOD Ivanoff  PERTINENT HISTORY: presented 11/30/23 with low back pain and weakness along with several falls since his elective PLIF L4-S1 with decompressive laminectomies on 11/15/23. Workup has revealed large what looks like epidural hematoma and wound hematoma. S/p reexploration of lumbar wound for evacuation of hematoma 8/8. PMH of anxiety, cancer, CKD III, HLD, HTN, hypothyroidism, neuropathy   PAIN:  Are you having pain? Yes: NPRS scale: 3/10 Pain location: LB Pain description: sore Aggravating factors: not leaning on walker Relieving factors: sitting down  PRECAUTIONS: Back and Fall  RED FLAGS: None   WEIGHT BEARING RESTRICTIONS: No  FALLS: Has patient fallen in last 6 months? Yes. Number of falls 5 *Last two falls were without walker LIVING ENVIRONMENT: Lives with: lives alone and has a nurse who is staying in home 24/7 Lives in: Other townhouse Stairs: 2 steps to enter with rails on B sides that he can reach, 2 story home but is able to remain on main floor with full bedroom/bathroom  Has following equipment at home: Single point cane, Walker - 2 wheeled, shower chair, bed side commode, Grab bars, and hand held shower head, transport wheelchair   PLOF: Independent and Independent with basic ADLs  PATIENT GOALS: Balance and walking  OBJECTIVE:   TODAY'S TREATMENT: 03/13/24 Activity Comments  Wall squats 2x10 physioball  Foot lift-off 2x10, 6 step Light UE support  Tandem forward/retrowalk x 2 min Minimal UE support, significant right Trendelenberg   Sidestep x 2 min 4#  Alt stair taps x 2 min 4#, 8  LAQ 3x10 4#   Forward/lateral step ups 2x10       TODAY'S TREATMENT: 03/11/24 Activity Comments  Nustep L5 x 6 min LEs only  Cueing to maintain 75SPM  Stair navigation with B handrails  Mild instability with single handrail  Alt step ups 8 with 1 handrail Good stability; cueing to verbally call out sequencing to keep alt pattern   in stair rail: alt forward step alt backward step sidestepping over hurdle staggered ant/pos wt shift staggered stance balance 1/2 turns (able to wean UE support) Occasional min A to prevent posterior LOB which improved with practice as pt was able to correct wt shift. Marked R hip weakness causes L hip drop when stepping      PATIENT EDUCATION: Education details: discussed pt's stairs at home and caregiver's concern about pt's safety on stairs at his home/brother's home; advised that pt should still have someone with him for safety with stairs  Person educated: Patient and Caregiver Carol Education method: Explanation and Demonstration Education comprehension: verbalized understanding and returned demonstration     HOME EXERCISE PROGRAM Access Code: [email or web address] URL: [email or web address] Date: 02/26/2024 Prepared by: [facility] - Outpatient  Rehab - [facility]  Program Notes perform standing activiites with supervision for safety   Exercises - Sit to Stand with Arms Crossed  - 1 x daily - 5 x weekly - 2 sets - 10 reps - Mini Squat with Counter Support  - 1 x daily - 5 x weekly - 2 sets - 10 reps - Standing Shoulder Row with Anchored Resistance  - 1 x daily - 5 x weekly - 2 sets - 10 reps - Seated Anti-Rotation Press With Anchored Resistance  - 1 x daily - 5 x weekly - 2 sets - 10 reps - Seated Pelvic Tilts  - 1 x daily - 5 x weekly - 2 sets - 10 reps  Note: Objective measures were completed at Evaluation unless otherwise noted.  DIAGNOSTIC FINDINGS: CT 01/31/24-no abnormality  COGNITION: Overall cognitive status: Within functional  limits for tasks assessed   SENSATION: Light touch: Impaired  and diminished sensation RLE  POSTURE: rounded shoulders, forward head, and posterior pelvic tilt In standing, knees crouched shaky in unsupported standing  LOWER EXTREMITY ROM:   pt slumps into posterior pelvic tilt with ROM  Active  Right Eval Left Eval  Hip flexion    Hip extension    Hip abduction    Hip adduction    Hip internal rotation    Hip external rotation    Knee flexion    Knee extension -10 -20  Ankle dorsiflexion 10 15  Ankle plantarflexion    Ankle inversion    Ankle eversion     (Blank rows = not tested)  LOWER EXTREMITY MMT:    MMT Right Eval Left Eval  Hip flexion 4 5  Hip extension    Hip abduction 4 4  Hip adduction 5 5  Hip internal rotation    Hip external rotation    Knee flexion 5 5  Knee extension 4 5  Ankle dorsiflexion 3- 3+  Ankle plantarflexion    Ankle inversion    Ankle eversion    (Blank rows = not tested)  TRANSFERS: Sit to stand: SBA  Assistive device utilized: Environmental Consultant - 2 wheeled     Stand to sit: SBA  Assistive device utilized: Environmental Consultant - 2 wheeled      GAIT: Findings: Gait Characteristics: step through pattern, knee flexed in stance- Right, knee flexed in stance- Left, and wide BOS, Distance walked: 50 ft, Assistive device utilized:Walker - 2 wheeled, and Level of assistance: CGA  FUNCTIONAL TESTS:  5 times sit to stand: 17.28 Timed up and go (TUG): 24.97 sec 10 meter walk test: 18.28 sec (1.79 ft/sec) Berg Balance: 27/56                                                                                                                                   TREATMENT DATE: 02/16/2024    PATIENT EDUCATION: Education details: Eval results, POC Person educated: Patient and Nurse, adult Education method: Explanation Education comprehension: verbalized understanding  HOME EXERCISE PROGRAM: At home, he is standing at counter:  marching, hip kicks side, knee  bends, heel raises  GOALS: Goals reviewed with patient? Yes  SHORT TERM GOALS: Target date: 03/15/2024  Pt will be independent with HEP for improved strength, balance, gait. Baseline: Goal status: IN PROGRESS  2.  Pt will improve 5x sit<>stand to less than or equal to 15 sec to demonstrate improved functional strength and transfer efficiency. Baseline: 17.28 sec Goal status: IN PROGRESS  3.  Pt will improve Berg score to at least 37/56 to decrease fall risk. Baseline: 27/56 Goal status: IN PROGRESS   LONG TERM GOALS: Target date: 04/12/2024  Pt will be independent with  HEP for improved strength, balance, gait. Baseline:  Goal status: IN PROGRESS  2.  Pt will improve 5x sit<>stand to less than or equal to 12 sec to demonstrate improved functional strength and transfer efficiency. Baseline: 17.28 sec Goal status: IN PROGRESS  3.  Pt will improve Berg score to at least 48/56 to decrease fall risk. Baseline: 27/56 Goal status: IN PROGRESS  4.  Pt will improve TUG score to less than or equal to 13 sec for decreased fall risk. Baseline: 24.97 sec Goal status: IN PROGRESS  5.  Pt will improve gait velocity to at least 2.3 ft/sec for improved gait efficiency and safety. Baseline: 1.79 ft/sec Goal status:IN PROGRESS  6.  Pt will verbalize understanding of fall prevention in home environment.  Baseline:  Goal status: IN PROGRESS  ASSESSMENT:  CLINICAL IMPRESSION: Focus on compound movement strength contrasted with balance activities for single limb stance control to minimize hip drop in stance phase.  Improved performance with visual cues from mirror and verbal/tactile cues for correction on the fly. Continued sessions to progress POC details    OBJECTIVE IMPAIRMENTS: Abnormal gait, decreased balance, decreased mobility, difficulty walking, decreased strength, impaired flexibility, and postural dysfunction.   ACTIVITY LIMITATIONS: standing, transfers, and locomotion  level  PARTICIPATION LIMITATIONS: meal prep, cleaning, laundry, shopping, community activity, and travel  PERSONAL FACTORS: 3+ comorbidities: see above are also affecting patient's functional outcome.   REHAB POTENTIAL: Good  CLINICAL DECISION MAKING: Evolving/moderate complexity  EVALUATION COMPLEXITY: Moderate  PLAN:  PT FREQUENCY: 2x/week  PT DURATION: 8 weeks plus eval  PLANNED INTERVENTIONS: 97750- Physical Performance Testing, 97110-Therapeutic exercises, 97530- Therapeutic activity, [id number]- Neuromuscular re-education, 97535- Self Care, [id number]- Manual therapy, [phone]- Gait training, Patient/Family education, and Balance training  PLAN FOR NEXT SESSION: check STG   2:38 PM, 03/13/24 M. Kelly Jniya Madara, PT, DPT Physical Therapist- [place] Office Number: [phone]

## 2024-03-13 NOTE — Patient Instructions (Signed)

## 2024-03-18 ENCOUNTER — Ambulatory Visit

## 2024-03-19 NOTE — Therapy (Signed)
 OUTPATIENT PHYSICAL THERAPY NEURO TREATMENT   Patient Name: Miguel Williams MRN: [id number] DOB:18-Oct-1945, 78 y.o., male Today's Date: 03/20/2024   PCP: Shayne Anes, MD REFERRING PROVIDER: Emeline Joesph BROCKS, DO   END OF SESSION:  PT End of Session - 03/20/24 1235     Visit Number 8    Number of Visits 17    Date for Recertification  04/12/24    Authorization Type [employer] Medicare    PT Start Time 1146    PT Stop Time 1227    PT Time Calculation (min) 41 min    Equipment Utilized During Treatment Gait belt    Activity Tolerance Patient tolerated treatment well    Behavior During Therapy WFL for tasks assessed/performed              Past Medical History:  Diagnosis Date   Abdominal pain, lower    Agitation    Allergy, unspecified, initial encounter    Anxiety disorder    Arthritis    Asthma    As a child   Bipolar disorder (HCC)    Patient denies   Cancer (HCC)    Bladder   Cancer (HCC)    Skin cancer   Chronic kidney disease, stage III (moderate) (HCC)    Chronic kidney disease, stage III (moderate) (HCC)    COPD (chronic obstructive pulmonary disease) (HCC)    Pt denies (10/2023)   Coronary atherosclerosis of native coronary artery    Esophageal reflux    Fatigue    Fungal granuloma    Hip pain    History of 2019 novel coronavirus disease (COVID-19)    History of endoscopy 02/00/2011   History of kidney stones    Hypertension    Hypo-osmolality and hyponatremia    Hypokalemia    Hypothyroidism    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 now has 3oz of wine every other day (as of 10/2023)   Umbilical hernia    Unspecified fall, subsequent encounter    Vitamin D deficiency, unspecified    Wernicke's encephalopathy 06/23/2019   Past Surgical History:  Procedure Laterality Date   CATARACT EXTRACTION W/ INTRAOCULAR LENS IMPLANT Bilateral    COLONOSCOPY  04/2023   COLONOSCOPY W/ ENDOSCOPIC US      CYSTOSCOPY WITH FULGERATION N/A 03/09/2023   Procedure: CYSTOSCOPY WITH FULGERATION WITH CLOT EVACUATION;  Surgeon: Devere Lonni Righter, MD;  Location: [facility];  Service: Urology;  Laterality: N/A;   CYSTOSCOPY WITH URETHRAL DILATATION N/A 03/08/2023   Procedure: CYSTOSCOPY;  Surgeon: Devere Lonni Righter, MD;  Location: [facility];  Service: Urology;  Laterality: N/A;  30 MINUTES   ESOPHAGOGASTRODUODENOSCOPY     LUMBAR WOUND DEBRIDEMENT N/A 12/01/2023   Procedure: LUMBAR WOUND DEBRIDEMENT;  Surgeon: [name], MD;  Location: [facility] OR;  Service: Neurosurgery;  Laterality: N/A;   TONSILLECTOMY  04/25/1950   Patient Active Problem List   Diagnosis Date Noted  Right cervical radiculopathy 02/05/2024   Constipation 12/20/2023   Acute on chronic back pain 12/20/2023   Neuropathy 12/20/2023   Depression with anxiety 12/08/2023   Neurogenic claudication due to lumbar spinal stenosis 12/05/2023   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   Lumbar epidural mass (HCC) 12/01/2023   Spinal stenosis of lumbar region 11/15/2023   Family history of alcoholism 11/06/2023   Headache, occipital 11/06/2023   History of pancreatitis 11/06/2023   Hyperparathyroidism 11/06/2023   Hypertension 11/06/2023   Joint swelling 11/06/2023   Joint stiffness 11/06/2023   Laryngopharyngeal reflux 11/06/2023   Lumbar polyradiculopathy 11/06/2023   Memory loss 11/06/2023   Anemia, blood loss 11/06/2023   Symptomatic anemia 08/10/2023   Anemia 08/09/2023   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 11/27/2022   Urinary tract infection without hematuria 11/09/2022   Bladder tumor 09/12/2022   Nausea and vomiting 09/12/2022   Kidney stones 12/11/2021   Scalp laceration 07/01/2021   Wernicke's encephalopathy 06/23/2019   Chronic kidney disease, stage III (moderate) (HCC)    Hyperkalemia 04/12/2019   Chronic kidney disease due to hypertension 04/12/2019   Stage 3a chronic kidney disease (HCC) 04/12/2019   Alcohol  use, unspecified with unspecified alcohol -induced disorder 04/12/2019   Pyelonephritis 04/05/2019   Renal failure (ARF), acute on chronic 04/05/2019   Ureterolithiasis 04/05/2019   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   Presbycusis of both ears 05/27/2016   Hardening of the aorta (main artery of the heart) 02/15/2016   Neck pain 01/15/2016   Tremor 12/18/2015   Hearing loss of left ear 06/04/2015   Other specified diseases of blood and blood-forming organs 07/28/2014   Long term current use of therapeutic drug 09/09/2013   Hyperlipidemia 04/03/2013   Hemorrhoids 02/18/2013   History of infectious disease 02/18/2013   Gout 02/18/2013   Pain, dental 01/01/2013   Health maintenance examination 03/30/2011   Abnormal prostate exam 03/08/2011   Abnormal prostate specific antigen 03/08/2011   GASTROESOPHAGEAL REFLUX DISEASE, CHRONIC 12/24/2008   ACUTE BRONCHITIS 12/16/2008   Asthma with exacerbation 12/16/2008   GANGLION CYST, TENDON SHEATH 06/27/2008   FOOT PAIN, LEFT 05/23/2008   ADVERSE DRUG REACTION 03/10/2008   GOUT 03/08/2007   BPH/LUTS W/O OBSTRUCTION 03/08/2007   ELEVATED BP W/O HYPERTENSION 03/08/2007   INSOMNIA, CHRONIC, MILD 10/02/2006   CERUMEN IMPACTION, BILATERAL 10/02/2006    ONSET DATE: 11/15/2023  REFERRING DIAG: F51.937 (ICD-10-CM) - Neurogenic claudication due to lumbar spinal stenosis   THERAPY DIAG:  Muscle weakness (generalized)  Other symptoms and signs involving the musculoskeletal system  Unsteadiness on  feet  Other abnormalities of gait and mobility  Abnormal posture  Rationale for Evaluation and Treatment: Rehabilitation  SUBJECTIVE:  SUBJECTIVE STATEMENT: I don't need the walker. Can you retest me?   Pt accompanied by: NurseGLENWOOD Ivanoff  PERTINENT HISTORY: presented 11/30/23 with low back pain and weakness along with several falls since his elective PLIF L4-S1 with decompressive laminectomies on 11/15/23. Workup has revealed large what looks like epidural hematoma and wound hematoma. S/p reexploration of lumbar wound for evacuation of hematoma 8/8. PMH of anxiety, cancer, CKD III, HLD, HTN, hypothyroidism, neuropathy   PAIN:  Are you having pain? Yes: NPRS scale: 4-5/10 Pain location: LB Pain description: sore Aggravating factors: not leaning on walker, walking Relieving factors: sitting down  PRECAUTIONS: Back and Fall  RED FLAGS: None   WEIGHT BEARING RESTRICTIONS: No  FALLS: Has patient fallen in last 6 months? Yes. Number of falls 5 *Last two falls were without walker LIVING ENVIRONMENT: Lives with: lives alone and has a nurse who is staying in home 24/7 Lives in: Other townhouse Stairs: 2 steps to enter with rails on B sides that he can reach, 2 story home but is able to remain on main floor with full bedroom/bathroom  Has following equipment at home: Single point cane, Walker - 2 wheeled, shower chair, bed side commode, Grab bars, and hand held shower head, transport wheelchair   PLOF: Independent and Independent with basic ADLs  PATIENT GOALS: Balance and walking  OBJECTIVE:     TODAY'S TREATMENT: 03/20/24 Activity Comments  Nustep L5 x 6 min LEs only  Cueing to maintain 75SPM  5xSTS 14.64 sec without UEs- retropulsion on first couple reps  5xSTS 2nd trial after cueing  10.77 sec    Berg  43/56  Gait training with quad tip cane  Good sequencing with straight ahead gait but difficulty maintaining sequencing with turns. Pt has a harder time with this   Stair navigation with cane and handrail Reciprocal ascending with good safety; instructed on step-to with cane descending        PATIENT EDUCATION: Education details: exam findings and edu on remaining falls risk; review of HEP; edu on using cane inside the house Rather than not using AD at all; advised to continue using RW outdoors.  Person educated: Patient and Corporate Treasurer Education method: Explanation and Demonstration Education comprehension: verbalized understanding     HOME EXERCISE PROGRAM Access Code: [email or web address] URL: [email or web address] Date: 02/26/2024 Prepared by: [facility] - Outpatient  Rehab - [facility]  Program Notes perform standing activiites with supervision for safety   Exercises - Sit to Stand with Arms Crossed  - 1 x daily - 5 x weekly - 2 sets - 10 reps - Mini Squat with Counter Support  - 1 x daily - 5 x weekly - 2 sets - 10 reps - Standing Shoulder Row with Anchored Resistance  - 1 x daily - 5 x weekly - 2 sets - 10 reps - Seated Anti-Rotation Press With Anchored Resistance  - 1 x daily - 5 x weekly - 2 sets - 10 reps - Seated Pelvic Tilts  - 1 x daily - 5 x weekly - 2 sets - 10 reps    Note: Objective measures were completed at Evaluation unless otherwise noted.  DIAGNOSTIC FINDINGS: CT 01/31/24-no abnormality  COGNITION: Overall cognitive status: Within functional limits for tasks assessed   SENSATION: Light touch: Impaired  and diminished sensation RLE  POSTURE: rounded shoulders, forward head, and posterior pelvic tilt In standing, knees crouched shaky in unsupported standing  LOWER EXTREMITY ROM:   pt slumps into posterior pelvic tilt with ROM  Active  Right Eval Left Eval  Hip flexion    Hip extension    Hip abduction    Hip adduction    Hip  internal rotation    Hip external rotation    Knee flexion    Knee extension -10 -20  Ankle dorsiflexion 10 15  Ankle plantarflexion    Ankle inversion    Ankle eversion     (Blank rows = not tested)  LOWER EXTREMITY MMT:    MMT Right Eval Left Eval  Hip flexion 4 5  Hip extension    Hip abduction 4 4  Hip adduction 5 5  Hip internal rotation    Hip external rotation    Knee flexion 5 5  Knee extension 4 5  Ankle dorsiflexion 3- 3+  Ankle plantarflexion    Ankle inversion    Ankle eversion    (Blank rows = not tested)  TRANSFERS: Sit to stand: SBA  Assistive device utilized: Environmental Consultant - 2 wheeled     Stand to sit: SBA  Assistive device utilized: Environmental Consultant - 2 wheeled      GAIT: Findings: Gait Characteristics: step through pattern, knee flexed in stance- Right, knee flexed in stance- Left, and wide BOS, Distance walked: 50 ft, Assistive device utilized:Walker - 2 wheeled, and Level of assistance: CGA  FUNCTIONAL TESTS:  5 times sit to stand: 17.28 Timed up and go (TUG): 24.97 sec 10 meter walk test: 18.28 sec (1.79 ft/sec) Lars Balance: 27/56    [facility] PT Assessment - 03/20/24 0001       Berg Balance Test   Sit to Stand Able to stand without using hands and stabilize independently    Standing Unsupported Able to stand 2 minutes with supervision    Sitting with Back Unsupported but Feet Supported on Floor or Stool Able to sit safely and securely 2 minutes    Stand to Sit Sits safely with minimal use of hands    Transfers Able to transfer safely, minor use of hands    Standing Unsupported with Eyes Closed Able to stand 10 seconds with supervision    Standing Unsupported with Feet Together Able to place feet together independently and stand for 1 minute with supervision    From Standing, Reach Forward with Outstretched Arm Can reach confidently >25 cm (10)    From Standing Position, Pick up Object from Floor Able to pick up shoe safely and easily    From Standing Position,  Turn to Look Behind Over each Shoulder Turn sideways only but maintains balance    Turn 360 Degrees Able to turn 360 degrees safely but slowly    Standing Unsupported, Alternately Place Feet on Step/Stool Able to stand independently and safely and complete 8 steps in 20 seconds    Standing Unsupported, One Foot in Front Able to take small step independently and hold 30 seconds    Standing on One Leg Unable to try or needs assist to prevent fall    Total Score 43    Berg comment: pt requires sit breaks  TREATMENT DATE: 02/16/2024    PATIENT EDUCATION: Education details: Eval results, POC Person educated: Patient and Nurse, adult Education method: Explanation Education comprehension: verbalized understanding  HOME EXERCISE PROGRAM: At home, he is standing at counter:  marching, hip kicks side, knee bends, heel raises  GOALS: Goals reviewed with patient? Yes  SHORT TERM GOALS: Target date: 03/15/2024  Pt will be independent with HEP for improved strength, balance, gait. Baseline: Goal status: MET 03/20/24  2.  Pt will improve 5x sit<>stand to less than or equal to 15 sec to demonstrate improved functional strength and transfer efficiency. Baseline: 17.28 sec; 14.64 sec before cueing 03/20/24  Goal status: MET 03/20/24   3.  Pt will improve Berg score to at least 37/56 to decrease fall risk. Baseline: 27/56; 43/56 03/20/24 Goal status: MET 03/20/24   LONG TERM GOALS: Target date: 04/12/2024  Pt will be independent with HEP for improved strength, balance, gait. Baseline:  Goal status: IN PROGRESS  2.  Pt will improve 5x sit<>stand to less than or equal to 12 sec to demonstrate improved functional strength and transfer efficiency. Baseline: 17.28 sec;  Goal status: IN PROGRESS   3.  Pt will improve Berg score to at least 48/56 to  decrease fall risk. Baseline: 27/56 Goal status: IN PROGRESS  4.  Pt will improve TUG score to less than or equal to 13 sec for decreased fall risk. Baseline: 24.97 sec Goal status: IN PROGRESS  5.  Pt will improve gait velocity to at least 2.3 ft/sec for improved gait efficiency and safety. Baseline: 1.79 ft/sec Goal status:IN PROGRESS  6.  Pt will verbalize understanding of fall prevention in home environment.  Baseline:  Goal status: IN PROGRESS  ASSESSMENT:  CLINICAL IMPRESSION: Patient arrived to session with report of not feeling like he needs RW for mobility any longer. STGs were assessed- patient scored 43/56 on Berg and improved speed with 5xSTS. STGs have been met at this time. Gait training with quad tip cane was initiated which revealed good stability; some additional practicing needed to sequence turns. Also able to perform stair navigation safely with use of cane. Patient and caregiver reported understanding of all edu provided and complaints at end of appointment.    OBJECTIVE IMPAIRMENTS: Abnormal gait, decreased balance, decreased mobility, difficulty walking, decreased strength, impaired flexibility, and postural dysfunction.   ACTIVITY LIMITATIONS: standing, transfers, and locomotion level  PARTICIPATION LIMITATIONS: meal prep, cleaning, laundry, shopping, community activity, and travel  PERSONAL FACTORS: 3+ comorbidities: see above are also affecting patient's functional outcome.   REHAB POTENTIAL: Good  CLINICAL DECISION MAKING: Evolving/moderate complexity  EVALUATION COMPLEXITY: Moderate  PLAN:  PT FREQUENCY: 2x/week  PT DURATION: 8 weeks plus eval  PLANNED INTERVENTIONS: 97750- Physical Performance Testing, 97110-Therapeutic exercises, 97530- Therapeutic activity, 97112- Neuromuscular re-education, 97535- Self Care, 02859- Manual therapy, [phone]- Gait training, Patient/Family education, and Balance training  PLAN FOR NEXT SESSION: continue gait  training with cane, adding in additional balance challenges    Louana Terrilyn Christians, PT, DPT 03/20/24 12:41 PM  [facility] [address] [place], [place] [address] Phone # [phone] Fax # [phone]

## 2024-03-20 ENCOUNTER — Ambulatory Visit: Admitting: Physical Therapy

## 2024-03-20 ENCOUNTER — Ambulatory Visit: Admitting: Occupational Therapy

## 2024-03-20 ENCOUNTER — Encounter: Payer: Self-pay | Admitting: Physical Therapy

## 2024-03-20 DIAGNOSIS — R2681 Unsteadiness on feet: Secondary | ICD-10-CM

## 2024-03-20 DIAGNOSIS — R2689 Other abnormalities of gait and mobility: Secondary | ICD-10-CM

## 2024-03-20 DIAGNOSIS — M6281 Muscle weakness (generalized): Secondary | ICD-10-CM

## 2024-03-20 DIAGNOSIS — R29898 Other symptoms and signs involving the musculoskeletal system: Secondary | ICD-10-CM

## 2024-03-20 DIAGNOSIS — R293 Abnormal posture: Secondary | ICD-10-CM

## 2024-03-20 DIAGNOSIS — R41840 Attention and concentration deficit: Secondary | ICD-10-CM

## 2024-03-20 NOTE — Therapy (Signed)
 OUTPATIENT OCCUPATIONAL THERAPY NEURO  Treatment Note  Patient Name: Miguel Williams MRN: [id number] DOB:11-Mar-1946, 78 y.o., male Today's Date: 03/20/2024  PCP: Shayne Anes, MD REFERRING PROVIDER: Emeline Joesph BROCKS, DO  END OF SESSION:  OT End of Session - 03/20/24 1115     Visit Number 6    Number of Visits 7    Date for Recertification  03/29/24    Authorization Type [employer] 2025    Authorization Time Period Auth#: [id number] , approved 6 OT visits from 02/13/2024-04/09/2024    Authorization - Visit Number 5    Authorization - Number of Visits 6    OT Start Time 1103    OT Stop Time 1143    OT Time Calculation (min) 40 min    Equipment Utilized During Treatment bean bags, rings and ring toss    Activity Tolerance Patient tolerated treatment well    Behavior During Therapy WFL for tasks assessed/performed               Past Medical History:  Diagnosis Date   Abdominal pain, lower    Agitation    Allergy, unspecified, initial encounter    Anxiety disorder    Arthritis    Asthma    As a child   Bipolar disorder (HCC)    Patient denies   Cancer (HCC)    Bladder   Cancer (HCC)    Skin cancer   Chronic kidney disease, stage III (moderate) (HCC)    Chronic kidney disease, stage III (moderate) (HCC)    COPD (chronic obstructive pulmonary disease) (HCC)    Pt denies (10/2023)   Coronary atherosclerosis of native coronary artery    Esophageal reflux    Fatigue    Fungal granuloma    Hip pain    History of 2019 novel coronavirus disease (COVID-19)    History of endoscopy 02/00/2011   History of kidney stones    Hypertension    Hypo-osmolality and hyponatremia    Hypokalemia    Hypothyroidism    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 now has 3oz of wine every other day (as of 10/2023)   Umbilical hernia    Unspecified fall, subsequent encounter    Vitamin D deficiency, unspecified    Wernicke's encephalopathy 06/23/2019   Past Surgical History:  Procedure Laterality Date   CATARACT EXTRACTION W/ INTRAOCULAR LENS IMPLANT Bilateral    COLONOSCOPY  04/2023   COLONOSCOPY W/ ENDOSCOPIC US      CYSTOSCOPY WITH FULGERATION N/A 03/09/2023   Procedure: CYSTOSCOPY WITH FULGERATION WITH CLOT EVACUATION;  Surgeon: Devere Lonni Righter, MD;  Location: [facility];  Service: Urology;  Laterality: N/A;   CYSTOSCOPY WITH URETHRAL DILATATION N/A 03/08/2023   Procedure: CYSTOSCOPY;  Surgeon: Devere Lonni Righter, MD;  Location: [facility];  Service: Urology;  Laterality: N/A;  30 MINUTES   ESOPHAGOGASTRODUODENOSCOPY     LUMBAR WOUND DEBRIDEMENT N/A 12/01/2023  Procedure: LUMBAR WOUND DEBRIDEMENT;  Surgeon: [name], MD;  Location: [facility] OR;  Service: Neurosurgery;  Laterality: N/A;   TONSILLECTOMY  04/25/1950   Patient Active Problem List   Diagnosis Date Noted   Right cervical radiculopathy 02/05/2024   Constipation 12/20/2023   Acute on chronic back pain 12/20/2023   Neuropathy 12/20/2023   Depression with anxiety 12/08/2023   Neurogenic claudication due to lumbar spinal stenosis 12/05/2023   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   Lumbar epidural mass (HCC) 12/01/2023   Spinal stenosis of lumbar region 11/15/2023   Family history of alcoholism 11/06/2023   Headache, occipital 11/06/2023   History of pancreatitis 11/06/2023   Hyperparathyroidism 11/06/2023   Hypertension 11/06/2023   Joint swelling 11/06/2023   Joint stiffness 11/06/2023    Laryngopharyngeal reflux 11/06/2023   Lumbar polyradiculopathy 11/06/2023   Memory loss 11/06/2023   Anemia, blood loss 11/06/2023   Symptomatic anemia 08/10/2023   Anemia 08/09/2023   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 11/27/2022   Urinary tract infection without hematuria 11/09/2022   Bladder tumor 09/12/2022   Nausea and vomiting 09/12/2022   Kidney stones 12/11/2021   Scalp laceration 07/01/2021   Wernicke's encephalopathy 06/23/2019   Chronic kidney disease, stage III (moderate) (HCC)    Hyperkalemia 04/12/2019   Chronic kidney disease due to hypertension 04/12/2019   Stage 3a chronic kidney disease (HCC) 04/12/2019   Alcohol  use, unspecified with unspecified alcohol -induced disorder 04/12/2019   Pyelonephritis 04/05/2019   Renal failure (ARF), acute on chronic 04/05/2019   Ureterolithiasis 04/05/2019   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   Presbycusis of both ears 05/27/2016   Hardening of the aorta (main artery of the heart) 02/15/2016   Neck pain 01/15/2016   Tremor 12/18/2015   Hearing loss of left ear 06/04/2015   Other specified diseases of blood and blood-forming organs 07/28/2014   Long term current use of therapeutic drug 09/09/2013   Hyperlipidemia 04/03/2013   Hemorrhoids 02/18/2013   History of infectious disease 02/18/2013   Gout 02/18/2013   Pain, dental 01/01/2013   Health maintenance examination 03/30/2011   Abnormal prostate exam 03/08/2011   Abnormal prostate specific antigen 03/08/2011   GASTROESOPHAGEAL REFLUX DISEASE, CHRONIC 12/24/2008   ACUTE BRONCHITIS 12/16/2008   Asthma with exacerbation 12/16/2008   GANGLION CYST, TENDON SHEATH 06/27/2008   FOOT PAIN, LEFT 05/23/2008   ADVERSE DRUG REACTION 03/10/2008   GOUT 03/08/2007   BPH/LUTS W/O OBSTRUCTION 03/08/2007   ELEVATED BP W/O HYPERTENSION 03/08/2007   INSOMNIA, CHRONIC, MILD 10/02/2006   CERUMEN IMPACTION, BILATERAL 10/02/2006    ONSET DATE: 11/15/23  REFERRING DIAG: F51.937  (ICD-10-CM) - Neurogenic claudication due to lumbar spinal stenosis  THERAPY DIAG:  Muscle weakness (generalized)  Other symptoms and signs involving the musculoskeletal system  Attention and concentration deficit  Rationale for Evaluation and Treatment: Rehabilitation  SUBJECTIVE:   SUBJECTIVE STATEMENT: Pt reporting that he has been doing some walking with and without the RW.  Pt reports that he has warmed up his own dinner.    Pt accompanied by: self and caregiver [name]  PERTINENT HISTORY: presented 11/30/23 with low back pain and weakness along with several falls since his elective PLIF L4-S1 with decompressive laminectomies on 11/15/23. Workup has revealed large what looks like epidural hematoma and wound hematoma. S/p reexploration of lumbar wound for evacuation of hematoma 8/8. PMH of anxiety, cancer, CKD III, HLD, HTN, hypothyroidism  PRECAUTIONS: Back  WEIGHT BEARING RESTRICTIONS: No  PAIN:  Are you having pain? Yes: NPRS scale:  3/10 Pain location: lower back Pain description: achy, dull Aggravating factors: twisting Relieving factors: rest  FALLS: Has patient fallen in last 6 months? Yes. Number of falls 4-6  LIVING ENVIRONMENT: Lives with: lives alone - has a engineer, civil (consulting) who is staying there 24/7 Lives in: Other townhouse Stairs: 2 steps to enter with rails on B sides that he can reach, 2 story home but is able to remain on main floor with full bedroom/bathroom Has following equipment at home: Single point cane, Walker - 2 wheeled, shower chair, bed side commode, Grab bars, and hand held shower head, transport wheelchair  PLOF: Independent and Independent with basic ADLs  PATIENT GOALS: walking better, to move to [place]  to live near sisters  OBJECTIVE:  Note: Objective measures were completed at Evaluation unless otherwise noted.  HAND DOMINANCE: Left  ADLs: Transfers/ambulation related to ADLs: using RW with all mobility Eating: typically will order door  dash for dinner Grooming: Mod I UB Dressing: Mod I LB Dressing: Mod I Toileting: Mod I Bathing: Mod I Tub Shower transfers: Mod I with grab bar Equipment: Grab bars, Walk in shower, bed side commode, Reacher, and Long handled sponge  IADLs: Light housekeeping: nurse is completing all Meal Prep: orders out, nurse will cook, will fix his own coffee in [employer] mobility: no driving Medication management: nurse fills his pillboxes   MOBILITY STATUS: Hx of falls; min cues to remain within RW when ambulating  POSTURE COMMENTS:  rounded shoulders and forward head  ACTIVITY TOLERANCE: Activity tolerance: decreased endurance and balance  FUNCTIONAL OUTCOME MEASURES: PSFS: 2.3   UPPER EXTREMITY ROM:  WFL   UPPER EXTREMITY MMT:   5/5  COGNITION: Overall cognitive status: Impaired; pt and nurse/caregiver reporting decreased memory and sequencing                                                                                                                             TREATMENT DATE:  03/20/24 Activity tolerance: encouraged pt to continue to engage in regular walking and exercise routine.  Educating on potential concern of back sliding with decreased activity over the holiday, as caregiver will not be by for 5 days to encourage pt to continue on with exercises. Pill box assessment: Completed in 4: 14 with 100% accuracy and no dropping.  At one point pt had scooted pill box farther away from body without recognizing, but was able to reposition prior to completing next medication.   Dynamic standing: engaged in picking up and placing cones onto various height surfaces to challenge activity tolerance, endurance, and balance as needed to simulate household tasks such as laundry, dishes, etc.  OT providing close supervision when reaching to lower surfaces.  Pt with no LOB.    03/13/24 Educated in energy conservation techniques. See Pt instructions for details. Re-assessed pill  box test, 100% accuracy with 7 minutes (pt was distracted this date talking to OT) Pt completed dynamic standing balance tasks including ring  toss and bean bags toss completing cross midline reach with BUE to hone improved functional mobility. Finally completed cross midline reach stacking and unstacking cones at cabinet to simulate putting away dishes.  03/06/24  Pt completed pill box test with 100% accuracy, in 4:53 minutes.  Pt demonstrates good multi-tasking and probelm solving throughout task.  Pt dropping 1 pills onto floor, discussed how he would deal with that if that were to happen at home without assist. Functional cognition further assessed with The Pillbox Test: A Measure of Executive Functioning and Estimate of Medication Management. A straight pass/fail designation is determined by 3 or more errors of omission or misplacement on the task. The pt completed the test with 0 errors. Dynamic standing: engaged in picking up clothespins on L lower surface with LUE and then RUE to incorporate increased trunk rotation, placing on vertical dowel at midline in standing.  Pt with one LOB when reaching across midline with RUE, requiring backwards step and min assist to correct.  Pt taking seated rest break after 5 mins of standing.  Pt then reaching with LUE across midline to clothespins on R and then placing on vertical dowel at midline.  Pt demonstrating improved trunk rotation to R and balance, however with 1 LOB but able to correct without assistance.  OT educating on functional carryover to laundry task and emptying dishwasher.     PATIENT EDUCATION: Education details: medication management, attention Person educated: Patient and Corporate Treasurer Education method: Explanation Education comprehension: verbalized understanding and needs further education  HOME EXERCISE PROGRAM: TBD   GOALS: Goals reviewed with patient? Yes  SHORT TERM GOALS: Target date: 03/08/24  Pt will verbalize  understanding of task modifications and/or potential A/E needs to increase ease, safety, and independence w/ ADLs and IADLs Baseline: nurse/caregiver completing all IADLs 03/06/24: pt reporting nurse/caregiver to transition to 7-8 hour work days and no weekends starting next week, educated on recommendation to start attempting tasks with supervision that he will need to complete when she is not around.  Goal status: in progress  2.  Pt will verbalize understanding of energy conservation techniques to increase independence and safety with IADLs. Baseline:  Goal status: in progress  3.  Pt will perform dynamic standing task for simple IADLS w/o LOB using DME and/or countertop support prn Baseline:  03/06/24: pt with 2 posterior LOB during standing task this session Goal status: in progress   LONG TERM GOALS: Target date: 03/29/24  Pt will complete pill box assessment with 1 or fewer errors for increased awareness with medication management. Baseline: nurse/caregiver completing managing meds 02/20/24: no errors, requiring 5:09 to complete 03/20/24: 4:14, no errors Goal status: MET  2.  Pt will demonstrate improved standing balance, tolerance, and awareness to complete laundry task at Mod I level. Baseline:  Goal status: in progress  3.  Pt will verbalize understanding of safe strategies and/or modifications to setup to increase safety with picking up items from ground. Baseline:  Goal status: in progress  4.  Patient will report at least two-point increase in average PSFS score or at least three-point increase in a single activity score indicating functionally significant improvement given minimum detectable change. Baseline: 2.3 Goal status: in progress   ASSESSMENT:  CLINICAL IMPRESSION: Patient is a 78 y.o. male who was seen today for occupational therapy treatment for cognitive, endurance, and balance deficits s/p frequent falls with concussion and spinal stenosis. Pt  demonstrating improved standing balance and activity tolerance this session, even with good balance  and awareness when reaching to pick up items from lower surface.  Pt is scheduled for one more appt, plan to f/u at next appt in regard to pt safety and activity tolerance after being alone for 4 days while caregiver is away from Thanksgiving holiday. If all goes well, pt will most likely d/c from OT after next session.   PERFORMANCE DEFICITS: in functional skills including IADLs, flexibility, mobility, balance, body mechanics, endurance, decreased knowledge of precautions, and decreased knowledge of use of DME, cognitive skills including memory, safety awareness, and sequencing, and psychosocial skills including routines and behaviors.     PLAN:  OT FREQUENCY: 1x/week  OT DURATION: 6 weeks  PLANNED INTERVENTIONS: 97168 OT Re-evaluation, 97535 self care/ADL training, 02889 therapeutic exercise, 97530 therapeutic activity, 97112 neuromuscular re-education, balance training, functional mobility training, psychosocial skills training, energy conservation, coping strategies training, patient/family education, and DME and/or AE instructions  RECOMMENDED OTHER SERVICES: PT  CONSULTED AND AGREED WITH PLAN OF CARE: Patient and nurse/caregiver  PLAN FOR NEXT SESSION: dynamic standing activities, f/u energy conservation  Assess goals and d/c if all goes well as he is alone this long weekend.  KAYLENE DOMINO, OTR/L 03/20/2024, 11:16 AM  [facility] at [facility] [address] [place], [address] [place], [place] [address] Phone # [phone] Fax # [phone]

## 2024-03-22 ENCOUNTER — Other Ambulatory Visit: Payer: Self-pay | Admitting: Registered Nurse

## 2024-03-25 ENCOUNTER — Ambulatory Visit: Admitting: Physical Therapy

## 2024-03-25 ENCOUNTER — Encounter: Payer: Self-pay | Admitting: Physical Therapy

## 2024-03-25 DIAGNOSIS — R29898 Other symptoms and signs involving the musculoskeletal system: Secondary | ICD-10-CM | POA: Diagnosis present

## 2024-03-25 DIAGNOSIS — R293 Abnormal posture: Secondary | ICD-10-CM | POA: Insufficient documentation

## 2024-03-25 DIAGNOSIS — R2689 Other abnormalities of gait and mobility: Secondary | ICD-10-CM | POA: Insufficient documentation

## 2024-03-25 DIAGNOSIS — R41840 Attention and concentration deficit: Secondary | ICD-10-CM | POA: Insufficient documentation

## 2024-03-25 DIAGNOSIS — R2681 Unsteadiness on feet: Secondary | ICD-10-CM | POA: Diagnosis present

## 2024-03-25 DIAGNOSIS — M6281 Muscle weakness (generalized): Secondary | ICD-10-CM | POA: Diagnosis present

## 2024-03-25 NOTE — Patient Instructions (Signed)
Local Driver Evaluation Programs: ° °Comprehensive Evaluation: includes clinical and in vehicle behind the wheel testing by OCCUPATIONAL THERAPIST. Programs have varying levels of adaptive controls available for trial.  ° °[facility] °[address] °[place], [place]  [address] °[phone] or [phone] °http://www.driver-rehab.com °Evaluator:  Cyndee Crompton, OT/CDRS/CDI/SCDCM/Low Vision Certification ° °[facility] °[address] °[place], [place] [address] °[phone] °[email or web address]://www.[email or web address].[email or web address]/home/services/rehabilitation.aspx °Evaluators:  Shannon Sheek, OT and Jill Tucker, OT ° °[facility] [place] [facility] (ONLY SERVES VETERANS!!) °[facility] °[address] °[place], [place]  [address] °[phone] °http://www.[place].va.gov/services/Physical_Medicine_Rehabilitation_Services.asp °Evaluators:  Eric Andrews, KT; Heidi Harris, KT;  Gary Whitaker, KT (KT=kiniesotherapist) ° ° °Clinical evaluations only:  Includes clinical testing, refers to other programs or local certified driving instructor for behind the wheel testing. ° °[facility] (outpatient Rehab) °[facility] °[address] °[place], [place] [address] °[phone] for scheduling °[email or web address]-[email or web address]-Therapy.[email or web address] °Evaluators:  Kelly Lambeth, OT; Kate Phillips, OT ° °Other area clinical evaluators available upon request including [facility], [facility] and [facility]. ° ° °    Resource List °What is a Driver Evaluation: °Your Road Ahead - A Guide to Comprehensive Driving Evaluations °http://www.thehartford.com/resources/mature-market-excellence/publications-on-aging ° °Association for [employer] - Disability and Driving Fact Sheets °http://www.aded.net/?page=510 ° °Driving after a Brain  Injury: °[employer] °http://www.biausa.org/tbims-abstracts/if-there-is-an-effective-way-to-determine-if-someone-is-ready-to-drive-after-tbi?A=SearchResult&SearchID=9495675&ObjectID=2758842&ObjectType=35 ° °Driving with Adaptive Equipment: °[facility] Process °http://www.driver-rehab.com/adaptive-equipment ° °[employer] °http://www.nmeda.com/ ° ° ° ° ° ° °  °

## 2024-03-25 NOTE — Therapy (Signed)
 OUTPATIENT PHYSICAL THERAPY NEURO TREATMENT   Patient Name: Miguel Williams MRN: [id number] DOB:17-Dec-1945, 78 y.o., male Today's Date: 03/25/2024   PCP: Shayne Anes, MD REFERRING PROVIDER: Emeline Joesph BROCKS, DO   END OF SESSION:  PT End of Session - 03/25/24 1404     Visit Number 9    Number of Visits 17    Date for Recertification  04/12/24    Authorization Type [employer] Medicare    PT Start Time 1405    PT Stop Time 1443    PT Time Calculation (min) 38 min    Equipment Utilized During Treatment Gait belt    Activity Tolerance Patient tolerated treatment well    Behavior During Therapy WFL for tasks assessed/performed               Past Medical History:  Diagnosis Date   Abdominal pain, lower    Agitation    Allergy, unspecified, initial encounter    Anxiety disorder    Arthritis    Asthma    As a child   Bipolar disorder (HCC)    Patient denies   Cancer (HCC)    Bladder   Cancer (HCC)    Skin cancer   Chronic kidney disease, stage III (moderate) (HCC)    Chronic kidney disease, stage III (moderate) (HCC)    COPD (chronic obstructive pulmonary disease) (HCC)    Pt denies (10/2023)   Coronary atherosclerosis of native coronary artery    Esophageal reflux    Fatigue    Fungal granuloma    Hip pain    History of 2019 novel coronavirus disease (COVID-19)    History of endoscopy 02/00/2011   History of kidney stones    Hypertension    Hypo-osmolality and hyponatremia    Hypokalemia    Hypothyroidism    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 now has 3oz of wine every other day (as of 10/2023)   Umbilical hernia    Unspecified fall, subsequent encounter    Vitamin D deficiency, unspecified    Wernicke's encephalopathy 06/23/2019   Past Surgical History:  Procedure Laterality Date   CATARACT EXTRACTION W/ INTRAOCULAR LENS IMPLANT Bilateral    COLONOSCOPY  04/2023   COLONOSCOPY W/ ENDOSCOPIC US      CYSTOSCOPY WITH FULGERATION N/A 03/09/2023   Procedure: CYSTOSCOPY WITH FULGERATION WITH CLOT EVACUATION;  Surgeon: Devere Lonni Righter, MD;  Location: [facility];  Service: Urology;  Laterality: N/A;   CYSTOSCOPY WITH URETHRAL DILATATION N/A 03/08/2023   Procedure: CYSTOSCOPY;  Surgeon: Devere Lonni Righter, MD;  Location: [facility];  Service: Urology;  Laterality: N/A;  30 MINUTES   ESOPHAGOGASTRODUODENOSCOPY     LUMBAR WOUND DEBRIDEMENT N/A 12/01/2023   Procedure: LUMBAR WOUND DEBRIDEMENT;  Surgeon: [name], MD;  Location[facility];  Service: Neurosurgery;  Laterality: N/A;   TONSILLECTOMY  04/25/1950   Patient Active Problem List   Diagnosis Date Noted  Right cervical radiculopathy 02/05/2024   Constipation 12/20/2023   Acute on chronic back pain 12/20/2023   Neuropathy 12/20/2023   Depression with anxiety 12/08/2023   Neurogenic claudication due to lumbar spinal stenosis 12/05/2023   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   Lumbar epidural mass (HCC) 12/01/2023   Spinal stenosis of lumbar region 11/15/2023   Family history of alcoholism 11/06/2023   Headache, occipital 11/06/2023   History of pancreatitis 11/06/2023   Hyperparathyroidism 11/06/2023   Hypertension 11/06/2023   Joint swelling 11/06/2023   Joint stiffness 11/06/2023   Laryngopharyngeal reflux 11/06/2023   Lumbar polyradiculopathy 11/06/2023   Memory loss 11/06/2023   Anemia, blood loss 11/06/2023   Symptomatic anemia 08/10/2023   Anemia 08/09/2023   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 11/27/2022   Urinary tract infection without hematuria 11/09/2022   Bladder tumor 09/12/2022   Nausea and vomiting 09/12/2022   Kidney stones 12/11/2021   Scalp laceration 07/01/2021   Wernicke's encephalopathy 06/23/2019   Chronic kidney disease, stage III (moderate) (HCC)    Hyperkalemia 04/12/2019   Chronic kidney disease due to hypertension 04/12/2019   Stage 3a chronic kidney disease (HCC) 04/12/2019   Alcohol  use, unspecified with unspecified alcohol -induced disorder 04/12/2019   Pyelonephritis 04/05/2019   Renal failure (ARF), acute on chronic 04/05/2019   Ureterolithiasis 04/05/2019   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   Presbycusis of both ears 05/27/2016   Hardening of the aorta (main artery of the heart) 02/15/2016   Neck pain 01/15/2016   Tremor 12/18/2015   Hearing loss of left ear 06/04/2015   Other specified diseases of blood and blood-forming organs 07/28/2014   Long term current use of therapeutic drug 09/09/2013   Hyperlipidemia 04/03/2013   Hemorrhoids 02/18/2013   History of infectious disease 02/18/2013   Gout 02/18/2013   Pain, dental 01/01/2013   Health maintenance examination 03/30/2011   Abnormal prostate exam 03/08/2011   Abnormal prostate specific antigen 03/08/2011   GASTROESOPHAGEAL REFLUX DISEASE, CHRONIC 12/24/2008   ACUTE BRONCHITIS 12/16/2008   Asthma with exacerbation 12/16/2008   GANGLION CYST, TENDON SHEATH 06/27/2008   FOOT PAIN, LEFT 05/23/2008   ADVERSE DRUG REACTION 03/10/2008   GOUT 03/08/2007   BPH/LUTS W/O OBSTRUCTION 03/08/2007   ELEVATED BP W/O HYPERTENSION 03/08/2007   INSOMNIA, CHRONIC, MILD 10/02/2006   CERUMEN IMPACTION, BILATERAL 10/02/2006    ONSET DATE: 11/15/2023  REFERRING DIAG: F51.937 (ICD-10-CM) - Neurogenic claudication due to lumbar spinal stenosis   THERAPY DIAG:  Muscle weakness (generalized)  Unsteadiness on feet  Other abnormalities of gait and mobility  Rationale for  Evaluation and Treatment: Rehabilitation  SUBJECTIVE:  SUBJECTIVE STATEMENT: Caregiver reports he is not using the cane correctly at home.    Pt accompanied by: NurseGLENWOOD Ivanoff  PERTINENT HISTORY: presented 11/30/23 with low back pain and weakness along with several falls since his elective PLIF L4-S1 with decompressive laminectomies on 11/15/23. Workup has revealed large what looks like epidural hematoma and wound hematoma. S/p reexploration of lumbar wound for evacuation of hematoma 8/8. PMH of anxiety, cancer, CKD III, HLD, HTN, hypothyroidism, neuropathy   PAIN:  Are you having pain? Yes: NPRS scale: 3/10 Pain location: LB Pain description: sore Aggravating factors: not leaning on walker, walking Relieving factors: sitting down  PRECAUTIONS: Back and Fall  RED FLAGS: None   WEIGHT BEARING RESTRICTIONS: No  FALLS: Has patient fallen in last 6 months? Yes. Number of falls 5 *Last two falls were without walker LIVING ENVIRONMENT: Lives with: lives alone and has a nurse who is staying in home 24/7 Lives in: Other townhouse Stairs: 2 steps to enter with rails on B sides that he can reach, 2 story home but is able to remain on main floor with full bedroom/bathroom  Has following equipment at home: Single point cane, Walker - 2 wheeled, shower chair, bed side commode, Grab bars, and hand held shower head, transport wheelchair   PLOF: Independent and Independent with basic ADLs  PATIENT GOALS: Balance and walking  OBJECTIVE:   Pt ambulates into clinic with [facility] with somewhat consistent and appropriate sequence; caregiver reports he has been carrying it at home.  TODAY'S TREATMENT: 03/25/2024 Activity Comments  Gait training with SPC with small quad tip base 160 ft, min guard Good sequence, better  fluid and consistent step length compared to use of SBQC  Gait training with SPC 120 ft, min guard Slightly more unsteady  Stair negotiation with cane and handrail Cues for step through sequence ascending, step to sequence descending for optimal safety  Forward/back gait in parallel bars, 1 UE support , then no suppoer Increased lateral trunk excursion/Trendlenburg pattern without UE support  Seated abdominal stabilization: Alt UE lifts Alt leg lifts Opposite arm/leg lifts UE lifting 2.2 weighted ball Cues for abdominal activation, to lessen posterior lean  Sit to stand x 5 reps, 3 sets Holding 2.2# ball, decreased eccentric control, min guard, some posterior lean upon full standing       TREATMENT: 03/20/24 Activity Comments  Nustep L5 x 6 min LEs only  Cueing to maintain 75SPM  5xSTS 14.64 sec without UEs- retropulsion on first couple reps  5xSTS 2nd trial after cueing  10.77 sec   Berg  43/56  Gait training with quad tip cane  Good sequencing with straight ahead gait but difficulty maintaining sequencing with turns. Pt has a harder time with this   Stair navigation with cane and handrail Reciprocal ascending with good safety; instructed on step-to with cane descending        PATIENT EDUCATION: Education details: Educated pt on using small tip quad cane (seems better than SBQC) inside the house Rather than not using AD at all; advised to continue using RW outdoors. Encouraged abdominal activation with sitting exercises; reprinted handout for driving eval resources (per patient request) Person educated: Patient and Caregiver Carol Education method: Explanation and Demonstration Education comprehension: verbalized understanding     HOME EXERCISE PROGRAM Access Code: [email or web address] URL: [email or web address] Date: 02/26/2024 Prepared by: [facility] - Outpatient  Rehab - [facility]  Program Notes perform standing activiites with supervision for safety    Exercises - Sit to Stand with  Arms Crossed  - 1 x daily - 5 x weekly - 2 sets - 10 reps - Mini Squat with Counter Support  - 1 x daily - 5 x weekly - 2 sets - 10 reps - Standing Shoulder Row with Anchored Resistance  - 1 x daily - 5 x weekly - 2 sets - 10 reps - Seated Anti-Rotation Press With Anchored Resistance  - 1 x daily - 5 x weekly - 2 sets - 10 reps - Seated Pelvic Tilts  - 1 x daily - 5 x weekly - 2 sets - 10 reps    Note: Objective measures were completed at Evaluation unless otherwise noted.  DIAGNOSTIC FINDINGS: CT 01/31/24-no abnormality  COGNITION: Overall cognitive status: Within functional limits for tasks assessed   SENSATION: Light touch: Impaired  and diminished sensation RLE  POSTURE: rounded shoulders, forward head, and posterior pelvic tilt In standing, knees crouched shaky in unsupported standing  LOWER EXTREMITY ROM:   pt slumps into posterior pelvic tilt with ROM  Active  Right Eval Left Eval  Hip flexion    Hip extension    Hip abduction    Hip adduction    Hip internal rotation    Hip external rotation    Knee flexion    Knee extension -10 -20  Ankle dorsiflexion 10 15  Ankle plantarflexion    Ankle inversion    Ankle eversion     (Blank rows = not tested)  LOWER EXTREMITY MMT:    MMT Right Eval Left Eval  Hip flexion 4 5  Hip extension    Hip abduction 4 4  Hip adduction 5 5  Hip internal rotation    Hip external rotation    Knee flexion 5 5  Knee extension 4 5  Ankle dorsiflexion 3- 3+  Ankle plantarflexion    Ankle inversion    Ankle eversion    (Blank rows = not tested)  TRANSFERS: Sit to stand: SBA  Assistive device utilized: Environmental Consultant - 2 wheeled     Stand to sit: SBA  Assistive device utilized: Environmental Consultant - 2 wheeled      GAIT: Findings: Gait Characteristics: step through pattern, knee flexed in stance- Right, knee flexed in stance- Left, and wide BOS, Distance walked: 50 ft, Assistive device utilized:Walker - 2 wheeled,  and Level of assistance: CGA  FUNCTIONAL TESTS:  5 times sit to stand: 17.28 Timed up and go (TUG): 24.97 sec 10 meter walk test: 18.28 sec (1.79 ft/sec) Berg Balance: 27/56                                                                                                                                    TREATMENT DATE: 02/16/2024    PATIENT EDUCATION: Education details: Eval results, POC Person educated: Patient and Caregiver nurse Education method: Explanation Education comprehension: verbalized understanding  HOME EXERCISE PROGRAM: At home, he is standing at counter:  marching, hip  kicks side, knee bends, heel raises  GOALS: Goals reviewed with patient? Yes  SHORT TERM GOALS: Target date: 03/15/2024  Pt will be independent with HEP for improved strength, balance, gait. Baseline: Goal status: MET 03/20/24  2.  Pt will improve 5x sit<>stand to less than or equal to 15 sec to demonstrate improved functional strength and transfer efficiency. Baseline: 17.28 sec; 14.64 sec before cueing 03/20/24  Goal status: MET 03/20/24   3.  Pt will improve Berg score to at least 37/56 to decrease fall risk. Baseline: 27/56; 43/56 03/20/24 Goal status: MET 03/20/24   LONG TERM GOALS: Target date: 04/12/2024  Pt will be independent with HEP for improved strength, balance, gait. Baseline:  Goal status: IN PROGRESS  2.  Pt will improve 5x sit<>stand to less than or equal to 12 sec to demonstrate improved functional strength and transfer efficiency. Baseline: 17.28 sec;  Goal status: IN PROGRESS   3.  Pt will improve Berg score to at least 48/56 to decrease fall risk. Baseline: 27/56 Goal status: IN PROGRESS  4.  Pt will improve TUG score to less than or equal to 13 sec for decreased fall risk. Baseline: 24.97 sec Goal status: IN PROGRESS  5.  Pt will improve gait velocity to at least 2.3 ft/sec for improved gait efficiency and safety. Baseline: 1.79 ft/sec Goal status:IN  PROGRESS  6.  Pt will verbalize understanding of fall prevention in home environment.  Baseline:  Goal status: IN PROGRESS  ASSESSMENT:  CLINICAL IMPRESSION: Pt presents today using small base quad cane into session. He does come into therapy with good sequence; however, caregiver reports he is carrying the cane at home.  Reiterated safety needs with definite use of cane versus no device; trialed smaller tip base, with pt doing a nice job staying steady and consistently using smaller quad tip base cane.  Skilled PT session focused on gait training, balance work, and core stability strengthening.  As he works in parallel bars with no support, he has increased lateral trunk excursion and Trendelenburg pattern, so worked on core stability in sitting.  He does continue to demo decreased eccentric control with sit to stand exercises. He will continue to benefit from skilled PT towards goals for improved functional mobility and decreased fall risk.   OBJECTIVE IMPAIRMENTS: Abnormal gait, decreased balance, decreased mobility, difficulty walking, decreased strength, impaired flexibility, and postural dysfunction.   ACTIVITY LIMITATIONS: standing, transfers, and locomotion level  PARTICIPATION LIMITATIONS: meal prep, cleaning, laundry, shopping, community activity, and travel  PERSONAL FACTORS: 3+ comorbidities: see above are also affecting patient's functional outcome.   REHAB POTENTIAL: Good  CLINICAL DECISION MAKING: Evolving/moderate complexity  EVALUATION COMPLEXITY: Moderate  PLAN:  PT FREQUENCY: 2x/week  PT DURATION: 8 weeks plus eval  PLANNED INTERVENTIONS: 97750- Physical Performance Testing, 97110-Therapeutic exercises, 97530- Therapeutic activity, 97112- Neuromuscular re-education, 97535- Self Care, [id number]- Manual therapy, [phone]- Gait training, Patient/Family education, and Balance training  PLAN FOR NEXT SESSION: 10th Visit PN; continue gait training with cane, adding in  additional balance challenges; core strengthening and hip stability exercises   Greig Anon, PT 03/25/24 2:18 PM Phone: [phone] Fax: [phone]  [facility] at [facility] [address] [place], [place] [address] Phone # [phone] Fax # [phone]

## 2024-03-27 ENCOUNTER — Encounter: Payer: Self-pay | Admitting: Physical Therapy

## 2024-03-27 ENCOUNTER — Ambulatory Visit: Admitting: Physical Therapy

## 2024-03-27 ENCOUNTER — Ambulatory Visit: Admitting: Occupational Therapy

## 2024-03-27 DIAGNOSIS — R2681 Unsteadiness on feet: Secondary | ICD-10-CM | POA: Diagnosis not present

## 2024-03-27 DIAGNOSIS — R2689 Other abnormalities of gait and mobility: Secondary | ICD-10-CM

## 2024-03-27 DIAGNOSIS — R29898 Other symptoms and signs involving the musculoskeletal system: Secondary | ICD-10-CM

## 2024-03-27 DIAGNOSIS — M6281 Muscle weakness (generalized): Secondary | ICD-10-CM

## 2024-03-27 DIAGNOSIS — R41840 Attention and concentration deficit: Secondary | ICD-10-CM

## 2024-03-27 NOTE — Therapy (Signed)
 OUTPATIENT PHYSICAL THERAPY NEURO TREATMENT/PROGRESS NOTE   Patient Name: Miguel Williams MRN: [id number] DOB:28-Sep-1945, 78 y.o., male Today's Date: 03/27/2024   PCP: Shayne Anes, MD REFERRING PROVIDER: Emeline Joesph BROCKS, DO   Progress Note Reporting Period 02/16/2024 to 03/27/2024  See note below for Objective Data and Assessment of Progress/Goals.     END OF SESSION:  PT End of Session - 03/27/24 1404     Visit Number 10    Number of Visits 17    Date for Recertification  04/12/24    Authorization Type [employer] Medicare    PT Start Time 1403    PT Stop Time 1443    PT Time Calculation (min) 40 min    Equipment Utilized During Treatment Gait belt    Activity Tolerance Patient tolerated treatment well    Behavior During Therapy WFL for tasks assessed/performed                Past Medical History:  Diagnosis Date   Abdominal pain, lower    Agitation    Allergy, unspecified, initial encounter    Anxiety disorder    Arthritis    Asthma    As a child   Bipolar disorder (HCC)    Patient denies   Cancer (HCC)    Bladder   Cancer (HCC)    Skin cancer   Chronic kidney disease, stage III (moderate) (HCC)    Chronic kidney disease, stage III (moderate) (HCC)    COPD (chronic obstructive pulmonary disease) (HCC)    Pt denies (10/2023)   Coronary atherosclerosis of native coronary artery    Esophageal reflux    Fatigue    Fungal granuloma    Hip pain    History of 2019 novel coronavirus disease (COVID-19)    History of endoscopy 02/00/2011   History of kidney stones    Hypertension    Hypo-osmolality and hyponatremia    Hypokalemia    Hypothyroidism    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 now has 3oz of wine every other day (as of 10/2023)   Umbilical hernia    Unspecified fall, subsequent encounter    Vitamin D deficiency, unspecified    Wernicke's encephalopathy 06/23/2019   Past Surgical History:  Procedure Laterality Date   CATARACT EXTRACTION W/ INTRAOCULAR LENS IMPLANT Bilateral    COLONOSCOPY  04/2023   COLONOSCOPY W/ ENDOSCOPIC US      CYSTOSCOPY WITH FULGERATION N/A 03/09/2023   Procedure: CYSTOSCOPY WITH FULGERATION WITH CLOT EVACUATION;  Surgeon: Devere Lonni Righter, MD;  Location: [facility];  Service: Urology;  Laterality: N/A;   CYSTOSCOPY WITH URETHRAL DILATATION N/A 03/08/2023   Procedure: CYSTOSCOPY;  Surgeon: Devere Lonni Righter, MD;  Location: [facility];  Service: Urology;  Laterality: N/A;  30 MINUTES   ESOPHAGOGASTRODUODENOSCOPY     LUMBAR WOUND DEBRIDEMENT N/A 12/01/2023   Procedure: LUMBAR WOUND DEBRIDEMENT;  Surgeon: [name], MD;  Location: [facility]  OR;  Service: Neurosurgery;  Laterality: N/A;   TONSILLECTOMY  04/25/1950   Patient Active Problem List   Diagnosis Date Noted   Right cervical radiculopathy 02/05/2024   Constipation 12/20/2023   Acute on chronic back pain 12/20/2023   Neuropathy 12/20/2023   Depression with anxiety 12/08/2023   Neurogenic claudication due to lumbar spinal stenosis 12/05/2023   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   Lumbar epidural mass (HCC) 12/01/2023   Spinal stenosis of lumbar region 11/15/2023   Family history of alcoholism 11/06/2023   Headache, occipital 11/06/2023   History of pancreatitis 11/06/2023   Hyperparathyroidism 11/06/2023   Hypertension 11/06/2023   Joint swelling 11/06/2023   Joint stiffness 11/06/2023   Laryngopharyngeal reflux 11/06/2023   Lumbar  polyradiculopathy 11/06/2023   Memory loss 11/06/2023   Anemia, blood loss 11/06/2023   Symptomatic anemia 08/10/2023   Anemia 08/09/2023   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 11/27/2022   Urinary tract infection without hematuria 11/09/2022   Bladder tumor 09/12/2022   Nausea and vomiting 09/12/2022   Kidney stones 12/11/2021   Scalp laceration 07/01/2021   Wernicke's encephalopathy 06/23/2019   Chronic kidney disease, stage III (moderate) (HCC)    Hyperkalemia 04/12/2019   Chronic kidney disease due to hypertension 04/12/2019   Stage 3a chronic kidney disease (HCC) 04/12/2019   Alcohol  use, unspecified with unspecified alcohol -induced disorder 04/12/2019   Pyelonephritis 04/05/2019   Renal failure (ARF), acute on chronic 04/05/2019   Ureterolithiasis 04/05/2019   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   Presbycusis of both ears 05/27/2016   Hardening of the aorta (main artery of the heart) 02/15/2016   Neck pain 01/15/2016   Tremor 12/18/2015   Hearing loss of left ear 06/04/2015   Other specified diseases of blood and blood-forming organs 07/28/2014   Long term current use of therapeutic drug 09/09/2013   Hyperlipidemia 04/03/2013   Hemorrhoids 02/18/2013   History of infectious disease 02/18/2013   Gout 02/18/2013   Pain, dental 01/01/2013   Health maintenance examination 03/30/2011   Abnormal prostate exam 03/08/2011   Abnormal prostate specific antigen 03/08/2011   GASTROESOPHAGEAL REFLUX DISEASE, CHRONIC 12/24/2008   ACUTE BRONCHITIS 12/16/2008   Asthma with exacerbation 12/16/2008   GANGLION CYST, TENDON SHEATH 06/27/2008   FOOT PAIN, LEFT 05/23/2008   ADVERSE DRUG REACTION 03/10/2008   GOUT 03/08/2007   BPH/LUTS W/O OBSTRUCTION 03/08/2007   ELEVATED BP W/O HYPERTENSION 03/08/2007   INSOMNIA, CHRONIC, MILD 10/02/2006   CERUMEN IMPACTION, BILATERAL 10/02/2006    ONSET DATE: 11/15/2023  REFERRING DIAG: F51.937 (ICD-10-CM) - Neurogenic claudication due to lumbar  spinal stenosis   THERAPY DIAG:  Unsteadiness on feet  Other abnormalities of gait and mobility  Muscle weakness (generalized)  Rationale for Evaluation and Treatment: Rehabilitation  SUBJECTIVE:  SUBJECTIVE STATEMENT: Been using the cane more at home-the small base quad cane is working just fine.  Have also been using the smaller tip quad cane at home.  Pt accompanied by: NurseGLENWOOD Ivanoff  PERTINENT HISTORY: presented 11/30/23 with low back pain and weakness along with several falls since his elective PLIF L4-S1 with decompressive laminectomies on 11/15/23. Workup has revealed large what looks like epidural hematoma and wound hematoma. S/p reexploration of lumbar wound for evacuation of hematoma 8/8. PMH of anxiety, cancer, CKD III, HLD, HTN, hypothyroidism, neuropathy   PAIN:  Are you having pain? Yes: NPRS scale: 2-4/10 Pain location: LB Pain description: sore Aggravating factors: not leaning on walker, walking Relieving factors: sitting down  PRECAUTIONS: Back and Fall  RED FLAGS: None   WEIGHT BEARING RESTRICTIONS: No  FALLS: Has patient fallen in last 6 months? Yes. Number of falls 5 *Last two falls were without walker LIVING ENVIRONMENT: Lives with: lives alone and has a nurse who is staying in home 24/7 Lives in: Other townhouse Stairs: 2 steps to enter with rails on B sides that he can reach, 2 story home but is able to remain on main floor with full bedroom/bathroom  Has following equipment at home: Single point cane, Walker - 2 wheeled, shower chair, bed side commode, Grab bars, and hand held shower head, transport wheelchair   PLOF: Independent and Independent with basic ADLs  PATIENT GOALS: Balance and walking  OBJECTIVE:     TODAY'S TREATMENT: 03/27/2024 Activity Comments   Gait with SBQC 150 ft, then 50 ft Additional short distance gait with SBQC and supervision Good sequencing-cues for tall posture and heel strike-he has tendency to have forward lean  Heel toe raises Limited dflex  Gastroc stretch-runner's stretch 3 x 30 sec  Cues for technique  Stagger stance rocking forward/back Cues for technique  Resisted sidestepping along parallel bars, 3 reps Red band-cues for foot clearance  Tandem gait forward/back  Tandem march gait forward/back BUE support BUE >1 UE support  Tandem stance 2 x 15 sec, partial tandem stance More difficulty with RLE posterior  Hip abduction, hip extension x 10 reps each No resistance, then red band   HOME EXERCISE PROGRAM Access Code: [email or web address] URL: [email or web address] Date: 03/27/2024 Prepared by: [facility] - Outpatient  Rehab - [facility]  Program Notes perform standing activiites with supervision for safety   Exercises - Sit to Stand with Arms Crossed  - 1 x daily - 5 x weekly - 2 sets - 10 reps - Mini Squat with Counter Support  - 1 x daily - 5 x weekly - 2 sets - 10 reps - Standing Shoulder Row with Anchored Resistance  - 1 x daily - 5 x weekly - 2 sets - 10 reps - Seated Anti-Rotation Press With Anchored Resistance  - 1 x daily - 5 x weekly - 2 sets - 10 reps - Seated Pelvic Tilts  - 1 x daily - 5 x weekly - 2 sets - 10 reps - Standing Gastroc Stretch at Counter  - 1 x daily - 7 x weekly - 1 sets - 3 reps - 30 sec hold - Standing Hip Abduction with Resistance at Ankles and Counter Support  - 1 x daily - 7 x weekly - 3 sets - 10 reps - Standing Romberg to 1/2 Tandem Stance  - 1 x daily - 5 x weekly - 1 sets - 3 reps - 15 sec hold - Standing Hip Extension with Resistance at Ankles and  Counter Support  - 1 x daily - 7 x weekly - 3 sets - 10 reps    PATIENT EDUCATION: Education details: Addition to HEP-see above Person educated: Patient and Corporate Treasurer Education method: Explanation and  Demonstration Education comprehension: verbalized understanding       Note: Objective measures were completed at Evaluation unless otherwise noted.  DIAGNOSTIC FINDINGS: CT 01/31/24-no abnormality  COGNITION: Overall cognitive status: Within functional limits for tasks assessed   SENSATION: Light touch: Impaired  and diminished sensation RLE  POSTURE: rounded shoulders, forward head, and posterior pelvic tilt In standing, knees crouched shaky in unsupported standing  LOWER EXTREMITY ROM:   pt slumps into posterior pelvic tilt with ROM  Active  Right Eval Left Eval  Hip flexion    Hip extension    Hip abduction    Hip adduction    Hip internal rotation    Hip external rotation    Knee flexion    Knee extension -10 -20  Ankle dorsiflexion 10 15  Ankle plantarflexion    Ankle inversion    Ankle eversion     (Blank rows = not tested)  LOWER EXTREMITY MMT:    MMT Right Eval Left Eval  Hip flexion 4 5  Hip extension    Hip abduction 4 4  Hip adduction 5 5  Hip internal rotation    Hip external rotation    Knee flexion 5 5  Knee extension 4 5  Ankle dorsiflexion 3- 3+  Ankle plantarflexion    Ankle inversion    Ankle eversion    (Blank rows = not tested)  TRANSFERS: Sit to stand: SBA  Assistive device utilized: Environmental Consultant - 2 wheeled     Stand to sit: SBA  Assistive device utilized: Environmental Consultant - 2 wheeled      GAIT: Findings: Gait Characteristics: step through pattern, knee flexed in stance- Right, knee flexed in stance- Left, and wide BOS, Distance walked: 50 ft, Assistive device utilized:Walker - 2 wheeled, and Level of assistance: CGA  FUNCTIONAL TESTS:  5 times sit to stand: 17.28 Timed up and go (TUG): 24.97 sec 10 meter walk test: 18.28 sec (1.79 ft/sec) Berg Balance: 27/56                                                                                                                                    TREATMENT DATE: 02/16/2024    PATIENT  EDUCATION: Education details: Eval results, POC Person educated: Patient and Nurse, adult Education method: Explanation Education comprehension: verbalized understanding  HOME EXERCISE PROGRAM: At home, he is standing at counter:  marching, hip kicks side, knee bends, heel raises  GOALS: Goals reviewed with patient? Yes  SHORT TERM GOALS: Target date: 03/15/2024  Pt will be independent with HEP for improved strength, balance, gait. Baseline: Goal status: MET 03/20/24  2.  Pt will improve 5x sit<>stand to less than or equal to 15 sec to  demonstrate improved functional strength and transfer efficiency. Baseline: 17.28 sec; 14.64 sec before cueing 03/20/24  Goal status: MET 03/20/24   3.  Pt will improve Berg score to at least 37/56 to decrease fall risk. Baseline: 27/56; 43/56 03/20/24 Goal status: MET 03/20/24   LONG TERM GOALS: Target date: 04/12/2024  Pt will be independent with HEP for improved strength, balance, gait. Baseline:  Goal status: IN PROGRESS  2.  Pt will improve 5x sit<>stand to less than or equal to 12 sec to demonstrate improved functional strength and transfer efficiency. Baseline: 17.28 sec;  Goal status: IN PROGRESS   3.  Pt will improve Berg score to at least 48/56 to decrease fall risk. Baseline: 27/56 Goal status: IN PROGRESS  4.  Pt will improve TUG score to less than or equal to 13 sec for decreased fall risk. Baseline: 24.97 sec Goal status: IN PROGRESS  5.  Pt will improve gait velocity to at least 2.3 ft/sec for improved gait efficiency and safety. Baseline: 1.79 ft/sec Goal status:IN PROGRESS  6.  Pt will verbalize understanding of fall prevention in home environment.  Baseline:  Goal status: IN PROGRESS  ASSESSMENT:  CLINICAL IMPRESSION: 10th Visit PN:  Pt presents today reporting he is using his cane more regularly with more ease at home. Skilled PT session focused on gait activities + flexibility/strength + balance.  With  gait, he tends to have forward posture, so worked on exercises for hip stability and for gastroc stretch/flexibility for better foot clearance and heelstrike. Objective measures recently taken:  Berg 43/56-improved from 27/56, FTSTS 14.64 sec-improved from >17 sec.  Pt has met all 3 of 3 STGs.  He is progressing towards LTGs and continues to benefit from skilled PT due to being at increased fall risk. OBJECTIVE IMPAIRMENTS: Abnormal gait, decreased balance, decreased mobility, difficulty walking, decreased strength, impaired flexibility, and postural dysfunction.   ACTIVITY LIMITATIONS: standing, transfers, and locomotion level  PARTICIPATION LIMITATIONS: meal prep, cleaning, laundry, shopping, community activity, and travel  PERSONAL FACTORS: 3+ comorbidities: see above are also affecting patient's functional outcome.   REHAB POTENTIAL: Good  CLINICAL DECISION MAKING: Evolving/moderate complexity  EVALUATION COMPLEXITY: Moderate  PLAN:  PT FREQUENCY: 2x/week  PT DURATION: 8 weeks plus eval  PLANNED INTERVENTIONS: 97750- Physical Performance Testing, 97110-Therapeutic exercises, 97530- Therapeutic activity, 97112- Neuromuscular re-education, 97535- Self Care, [id number]- Manual therapy, [phone]- Gait training, Patient/Family education, and Balance training  PLAN FOR NEXT SESSION: Review updates to HEP; continue gait training with cane, adding in additional balance challenges; core strengthening and hip stability exercises   Greig Anon, PT 03/27/24 5:18 PM Phone: [phone] Fax: [phone]  [facility] at [facility] [address] [place], [place] [address] Phone # [phone] Fax # [phone]

## 2024-03-27 NOTE — Patient Instructions (Addendum)
 SABRA

## 2024-03-27 NOTE — Therapy (Signed)
 OUTPATIENT OCCUPATIONAL THERAPY NEURO  Treatment Note  Patient Name: Miguel Williams MRN: [id number] DOB:05/10/1945, 78 y.o., male Today's Date: 03/27/2024  PCP: Shayne Anes, MD REFERRING PROVIDER: Emeline Joesph BROCKS, DO  OCCUPATIONAL THERAPY DISCHARGE SUMMARY  Visits from Start of Care: 7  Current functional level related to goals / functional outcomes: Pt has met 4 of 4 LTGs and has progressed from 24/7 supervision to intermittent supervision, primarily to provide transportation and aid in medication management.  Pt has demonstrated improvements in activity tolerance, balance, and awareness of safety considerations to increase independence with ADLs and IADLs.   Remaining deficits: Balance, caregiver aiding in meds   Education / Equipment: Energy conservation, drivers evaluation and/or CarFit for safe return to driving when appropriate, activity tolerance, medication management   Patient agrees to discharge. Patient goals were met. Patient is being discharged due to meeting the stated rehab goals..     END OF SESSION:  OT End of Session - 03/27/24 1317     Visit Number 7    Number of Visits 7    Date for Recertification  03/29/24    Authorization Type [employer] 2025    Authorization Time Period Auth#: [id number] , approved 6 OT visits from 02/13/2024-04/09/2024    Authorization - Visit Number 6    Authorization - Number of Visits 6    OT Start Time 1318    OT Stop Time 1359    OT Time Calculation (min) 41 min    Equipment Utilized During Treatment bean bags, rings and ring toss    Activity Tolerance Patient tolerated treatment well    Behavior During Therapy WFL for tasks assessed/performed                Past Medical History:  Diagnosis Date   Abdominal pain, lower    Agitation    Allergy, unspecified, initial encounter    Anxiety disorder    Arthritis    Asthma    As a child   Bipolar disorder (HCC)    Patient denies   Cancer (HCC)    Bladder    Cancer (HCC)    Skin cancer   Chronic kidney disease, stage III (moderate) (HCC)    Chronic kidney disease, stage III (moderate) (HCC)    COPD (chronic obstructive pulmonary disease) (HCC)    Pt denies (10/2023)   Coronary atherosclerosis of native coronary artery    Esophageal reflux    Fatigue    Fungal granuloma    Hip pain    History of 2019 novel coronavirus disease (COVID-19)    History of endoscopy 02/00/2011   History of kidney stones    Hypertension    Hypo-osmolality and hyponatremia    Hypokalemia    Hypothyroidism    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 now has 3oz of wine every other day (as of 10/2023)   Umbilical hernia    Unspecified fall, subsequent encounter  Vitamin D deficiency, unspecified    Wernicke's encephalopathy 06/23/2019   Past Surgical History:  Procedure Laterality Date   CATARACT EXTRACTION W/ INTRAOCULAR LENS IMPLANT Bilateral    COLONOSCOPY  04/2023   COLONOSCOPY W/ ENDOSCOPIC US      CYSTOSCOPY WITH FULGERATION N/A 03/09/2023   Procedure: CYSTOSCOPY WITH FULGERATION WITH CLOT EVACUATION;  Surgeon: Devere Lonni Righter, MD;  Location: [facility];  Service: Urology;  Laterality: N/A;   CYSTOSCOPY WITH URETHRAL DILATATION N/A 03/08/2023   Procedure: CYSTOSCOPY;  Surgeon: Devere Lonni Righter, MD;  Location: [facility];  Service: Urology;  Laterality: N/A;  30 MINUTES   ESOPHAGOGASTRODUODENOSCOPY     LUMBAR WOUND DEBRIDEMENT N/A 12/01/2023   Procedure:  LUMBAR WOUND DEBRIDEMENT;  Surgeon: [name], MD;  Location: [facility];  Service: Neurosurgery;  Laterality: N/A;   TONSILLECTOMY  04/25/1950   Patient Active Problem List   Diagnosis Date Noted   Right cervical radiculopathy 02/05/2024   Constipation 12/20/2023   Acute on chronic back pain 12/20/2023   Neuropathy 12/20/2023   Depression with anxiety 12/08/2023   Neurogenic claudication due to lumbar spinal stenosis 12/05/2023   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   Lumbar epidural mass (HCC) 12/01/2023   Spinal stenosis of lumbar region 11/15/2023   Family history of alcoholism 11/06/2023   Headache, occipital 11/06/2023   History of pancreatitis 11/06/2023   Hyperparathyroidism 11/06/2023   Hypertension 11/06/2023   Joint swelling 11/06/2023   Joint stiffness 11/06/2023   Laryngopharyngeal reflux 11/06/2023   Lumbar polyradiculopathy 11/06/2023   Memory loss 11/06/2023   Anemia, blood loss 11/06/2023   Symptomatic anemia 08/10/2023   Anemia 08/09/2023   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 11/27/2022   Urinary tract infection without hematuria 11/09/2022   Bladder tumor 09/12/2022   Nausea and vomiting 09/12/2022   Kidney stones 12/11/2021   Scalp laceration 07/01/2021   Wernicke's encephalopathy 06/23/2019   Chronic kidney disease, stage III (moderate) (HCC)    Hyperkalemia 04/12/2019   Chronic kidney disease due to hypertension 04/12/2019   Stage 3a chronic kidney disease (HCC) 04/12/2019   Alcohol  use, unspecified with unspecified alcohol -induced disorder 04/12/2019   Pyelonephritis 04/05/2019   Renal failure (ARF), acute on chronic 04/05/2019   Ureterolithiasis 04/05/2019   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   Presbycusis of both ears 05/27/2016   Hardening of the aorta (main artery of the heart) 02/15/2016   Neck pain 01/15/2016   Tremor 12/18/2015   Hearing loss of left ear 06/04/2015   Other specified diseases of blood and blood-forming organs  07/28/2014   Long term current use of therapeutic drug 09/09/2013   Hyperlipidemia 04/03/2013   Hemorrhoids 02/18/2013   History of infectious disease 02/18/2013   Gout 02/18/2013   Pain, dental 01/01/2013   Health maintenance examination 03/30/2011   Abnormal prostate exam 03/08/2011   Abnormal prostate specific antigen 03/08/2011   GASTROESOPHAGEAL REFLUX DISEASE, CHRONIC 12/24/2008   ACUTE BRONCHITIS 12/16/2008   Asthma with exacerbation 12/16/2008   GANGLION CYST, TENDON SHEATH 06/27/2008   FOOT PAIN, LEFT 05/23/2008   ADVERSE DRUG REACTION 03/10/2008   GOUT 03/08/2007   BPH/LUTS W/O OBSTRUCTION 03/08/2007   ELEVATED BP W/O HYPERTENSION 03/08/2007   INSOMNIA, CHRONIC, MILD 10/02/2006   CERUMEN IMPACTION, BILATERAL 10/02/2006    ONSET DATE: 11/15/23  REFERRING DIAG: F51.937 (ICD-10-CM) - Neurogenic claudication due to lumbar spinal stenosis  THERAPY DIAG:  Attention and concentration deficit  Other symptoms and signs involving the musculoskeletal system  Rationale for Evaluation and  Treatment: Rehabilitation  SUBJECTIVE:   SUBJECTIVE STATEMENT: Pt reporting that [name] has been at his house a lot less.  She is coming to take him to his appts and is still assisting with his medications.    Pt accompanied by: self and caregiver [name]  PERTINENT HISTORY: presented 11/30/23 with low back pain and weakness along with several falls since his elective PLIF L4-S1 with decompressive laminectomies on 11/15/23. Workup has revealed large what looks like epidural hematoma and wound hematoma. S/p reexploration of lumbar wound for evacuation of hematoma 8/8. PMH of anxiety, cancer, CKD III, HLD, HTN, hypothyroidism  PRECAUTIONS: Back  WEIGHT BEARING RESTRICTIONS: No  PAIN:  Are you having pain? Yes: NPRS scale: 3/10 Pain location: lower back Pain description: achy, dull Aggravating factors: twisting Relieving factors: rest  FALLS: Has patient fallen in last 6 months? Yes. Number  of falls 4-6  LIVING ENVIRONMENT: Lives with: lives alone - has a engineer, civil (consulting) who is staying there 24/7 Lives in: Other townhouse Stairs: 2 steps to enter with rails on B sides that he can reach, 2 story home but is able to remain on main floor with full bedroom/bathroom Has following equipment at home: Single point cane, Walker - 2 wheeled, shower chair, bed side commode, Grab bars, and hand held shower head, transport wheelchair  PLOF: Independent and Independent with basic ADLs  PATIENT GOALS: walking better, to move to [place]  to live near sisters  OBJECTIVE:  Note: Objective measures were completed at Evaluation unless otherwise noted.  HAND DOMINANCE: Left  ADLs: Transfers/ambulation related to ADLs: using RW with all mobility Eating: typically will order door dash for dinner Grooming: Mod I UB Dressing: Mod I LB Dressing: Mod I Toileting: Mod I Bathing: Mod I Tub Shower transfers: Mod I with grab bar Equipment: Grab bars, Walk in shower, bed side commode, Reacher, and Long handled sponge  IADLs: Light housekeeping: nurse is completing all Meal Prep: orders out, nurse will cook, will fix his own coffee in [employer] mobility: no driving Medication management: nurse fills his pillboxes   MOBILITY STATUS: Hx of falls; min cues to remain within RW when ambulating  POSTURE COMMENTS:  rounded shoulders and forward head  ACTIVITY TOLERANCE: Activity tolerance: decreased endurance and balance  FUNCTIONAL OUTCOME MEASURES: PSFS: 2.3   03/27/24: PSFS 8.3   UPPER EXTREMITY ROM:  WFL   UPPER EXTREMITY MMT:   5/5  COGNITION: Overall cognitive status: Impaired; pt and nurse/caregiver reporting decreased memory and sequencing                                                                                                                             TREATMENT DATE:  03/27/24 ADLs: pt reporting that he was able to manage his daily routine and meal prep at home  over the last few days when caregiver was off for the Thanksgiving holiday.   Pt reports that he will frequently get up and walk somewhere in  the house and realize along the way that he has left his cane.  He does report that he has started being intentional about placing his cane in the same spot every time he sits down to increase recall.   Laundry: OT educating on alternative strategies and routines to increase safety with laundry tasks.  OT recommending either taking laundry daily to laundry room, use of laundry hamper on wheels, or use of transport chair with laundry basket in to push to laundry room.  Reiterated use of reacher to obtain items from laundry basket or back of washing machine/dryer. Energy conservation: educated on 4P's of energy conservation (planning, prioritizing, pacing, and positioning) and providing cues and examples for each.  Provided with handout as well as handout from OT toolkit (see pt instructions). Return to driving: reiterated previous education on driver's evaluation to assess safe resumption of driving.  OT also educating on CarFit program that can assess safety of car and setup of mirrors and seat proximity to wheel which may be beneficial.  Provided with website (www.car-fit.org).    03/20/24 Activity tolerance: encouraged pt to continue to engage in regular walking and exercise routine.  Educating on potential concern of back sliding with decreased activity over the holiday, as caregiver will not be by for 5 days to encourage pt to continue on with exercises. Pill box assessment: Completed in 4: 14 with 100% accuracy and no dropping.  At one point pt had scooted pill box farther away from body without recognizing, but was able to reposition prior to completing next medication.   Dynamic standing: engaged in picking up and placing cones onto various height surfaces to challenge activity tolerance, endurance, and balance as needed to simulate household tasks such as  laundry, dishes, etc.  OT providing close supervision when reaching to lower surfaces.  Pt with no LOB.    03/13/24 Educated in energy conservation techniques. See Pt instructions for details. Re-assessed pill box test, 100% accuracy with 7 minutes (pt was distracted this date talking to OT) Pt completed dynamic standing balance tasks including ring toss and bean bags toss completing cross midline reach with BUE to hone improved functional mobility. Finally completed cross midline reach stacking and unstacking cones at cabinet to simulate putting away dishes.   PATIENT EDUCATION: Education details: medication management, attention Person educated: Patient and Corporate Treasurer Education method: Explanation Education comprehension: verbalized understanding and needs further education  HOME EXERCISE PROGRAM: TBD   GOALS: Goals reviewed with patient? Yes  SHORT TERM GOALS: Target date: 03/08/24  Pt will verbalize understanding of task modifications and/or potential A/E needs to increase ease, safety, and independence w/ ADLs and IADLs Baseline: nurse/caregiver completing all IADLs 03/06/24: pt reporting nurse/caregiver to transition to 7-8 hour work days and no weekends starting next week, educated on recommendation to start attempting tasks with supervision that he will need to complete when she is not around.  Goal status: in progress  2.  Pt will verbalize understanding of energy conservation techniques to increase independence and safety with IADLs. Baseline:  Goal status: in progress  3.  Pt will perform dynamic standing task for simple IADLS w/o LOB using DME and/or countertop support prn Baseline:  03/06/24: pt with 2 posterior LOB during standing task this session Goal status: in progress   LONG TERM GOALS: Target date: 03/29/24  Pt will complete pill box assessment with 1 or fewer errors for increased awareness with medication management. Baseline: nurse/caregiver  completing managing meds 02/20/24: no errors, requiring  5:09 to complete 03/20/24: 4:14, no errors Goal status: MET  2.  Pt will demonstrate improved standing balance, tolerance, and awareness to complete laundry task at Mod I level. Baseline:  03/27/24: pt reporting that he has completed laundry task without assistance (Mod I) Goal status: MET  3.  Pt will verbalize understanding of safe strategies and/or modifications to setup to increase safety with picking up items from ground. Baseline:  03/27/24: pt reporting that he will hold onto counter top or other stable object when reaching to floor Goal status: MET  4.  Patient will report at least two-point increase in average PSFS score or at least three-point increase in a single activity score indicating functionally significant improvement given minimum detectable change. Baseline: 2.3 03/27/24: 8.3  Goal status: MET   ASSESSMENT:  CLINICAL IMPRESSION: Patient is a 78 y.o. male who was seen today for occupational therapy treatment for cognitive, endurance, and balance deficits s/p frequent falls with concussion and spinal stenosis. Pt demonstrating and reporting improvements in functional activities in home with use of large based quad cane.  Pt reporting completing simple meal prep and household tasks over 4 day weekend for Thanksgiving without any assistance from nurse/caregiver. Caregiver has further reduced her hours this week, coming by fro transportation and to set up medications with use of pill box.  Pt and caregiver still having questions about safe return to driving, therefore caregiver to continue providing transportation at this time.  Pt and caregiver in agreement with d/c from OT services at this time.   PERFORMANCE DEFICITS: in functional skills including IADLs, flexibility, mobility, balance, body mechanics, endurance, decreased knowledge of precautions, and decreased knowledge of use of DME, cognitive skills including  memory, safety awareness, and sequencing, and psychosocial skills including routines and behaviors.     PLAN:  OT FREQUENCY: 1x/week  OT DURATION: 6 weeks  PLANNED INTERVENTIONS: 97168 OT Re-evaluation, 97535 self care/ADL training, 02889 therapeutic exercise, 97530 therapeutic activity, 97112 neuromuscular re-education, balance training, functional mobility training, psychosocial skills training, energy conservation, coping strategies training, patient/family education, and DME and/or AE instructions  RECOMMENDED OTHER SERVICES: PT  CONSULTED AND AGREED WITH PLAN OF CARE: Patient and nurse/caregiver   KAYLENE DOMINO, OTR/L 03/27/2024, 3:03 PM  [facility] at [facility] [address] [place], [place] [address] Phone # [phone] Fax # [phone]

## 2024-04-01 ENCOUNTER — Ambulatory Visit

## 2024-04-01 DIAGNOSIS — R2689 Other abnormalities of gait and mobility: Secondary | ICD-10-CM

## 2024-04-01 DIAGNOSIS — R2681 Unsteadiness on feet: Secondary | ICD-10-CM | POA: Diagnosis not present

## 2024-04-01 DIAGNOSIS — M6281 Muscle weakness (generalized): Secondary | ICD-10-CM

## 2024-04-01 DIAGNOSIS — R293 Abnormal posture: Secondary | ICD-10-CM

## 2024-04-01 NOTE — Therapy (Signed)
 OUTPATIENT PHYSICAL THERAPY NEURO TREATMENT   Patient Name: Miguel Williams MRN: [id number] DOB:08-Aug-1945, 78 y.o., male Today's Date: 04/01/2024   PCP: Shayne Anes, MD REFERRING PROVIDER: Emeline Joesph BROCKS, DO      END OF SESSION:  PT End of Session - 04/01/24 1526     Visit Number 11    Number of Visits 17    Date for Recertification  04/12/24    Authorization Type [employer] Medicare    PT Start Time 1530    PT Stop Time 1615    PT Time Calculation (min) 45 min    Equipment Utilized During Treatment Gait belt    Activity Tolerance Patient tolerated treatment well    Behavior During Therapy WFL for tasks assessed/performed                Past Medical History:  Diagnosis Date   Abdominal pain, lower    Agitation    Allergy, unspecified, initial encounter    Anxiety disorder    Arthritis    Asthma    As a child   Bipolar disorder (HCC)    Patient denies   Cancer (HCC)    Bladder   Cancer (HCC)    Skin cancer   Chronic kidney disease, stage III (moderate) (HCC)    Chronic kidney disease, stage III (moderate) (HCC)    COPD (chronic obstructive pulmonary disease) (HCC)    Pt denies (10/2023)   Coronary atherosclerosis of native coronary artery    Esophageal reflux    Fatigue    Fungal granuloma    Hip pain    History of 2019 novel coronavirus disease (COVID-19)    History of endoscopy 02/00/2011   History of kidney stones    Hypertension    Hypo-osmolality and hyponatremia    Hypokalemia    Hypothyroidism    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 now has 3oz of wine every other day (as of 10/2023)   Umbilical hernia    Unspecified fall, subsequent encounter    Vitamin D deficiency, unspecified    Wernicke's encephalopathy 06/23/2019   Past Surgical History:  Procedure Laterality Date   CATARACT EXTRACTION W/ INTRAOCULAR LENS IMPLANT Bilateral    COLONOSCOPY  04/2023   COLONOSCOPY W/ ENDOSCOPIC US      CYSTOSCOPY WITH FULGERATION N/A 03/09/2023   Procedure: CYSTOSCOPY WITH FULGERATION WITH CLOT EVACUATION;  Surgeon: Devere Lonni Righter, MD;  Location: [facility];  Service: Urology;  Laterality: N/A;   CYSTOSCOPY WITH URETHRAL DILATATION N/A 03/08/2023   Procedure: CYSTOSCOPY;  Surgeon: Devere Lonni Righter, MD;  Location: [facility];  Service: Urology;  Laterality: N/A;  30 MINUTES   ESOPHAGOGASTRODUODENOSCOPY     LUMBAR WOUND DEBRIDEMENT N/A 12/01/2023   Procedure: LUMBAR WOUND DEBRIDEMENT;  Surgeon: [name], MD;  Location: [facility] OR;  Service: Neurosurgery;  Laterality: N/A;   TONSILLECTOMY  04/25/1950   Patient Active Problem List  Diagnosis Date Noted   Right cervical radiculopathy 02/05/2024   Constipation 12/20/2023   Acute on chronic back pain 12/20/2023   Neuropathy 12/20/2023   Depression with anxiety 12/08/2023   Neurogenic claudication due to lumbar spinal stenosis 12/05/2023   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   Lumbar epidural mass (HCC) 12/01/2023   Spinal stenosis of lumbar region 11/15/2023   Family history of alcoholism 11/06/2023   Headache, occipital 11/06/2023   History of pancreatitis 11/06/2023   Hyperparathyroidism 11/06/2023   Hypertension 11/06/2023   Joint swelling 11/06/2023   Joint stiffness 11/06/2023   Laryngopharyngeal reflux 11/06/2023   Lumbar polyradiculopathy 11/06/2023   Memory loss 11/06/2023   Anemia, blood loss 11/06/2023   Symptomatic anemia 08/10/2023   Anemia 08/09/2023    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 11/27/2022   Urinary tract infection without hematuria 11/09/2022   Bladder tumor 09/12/2022   Nausea and vomiting 09/12/2022   Kidney stones 12/11/2021   Scalp laceration 07/01/2021   Wernicke's encephalopathy 06/23/2019   Chronic kidney disease, stage III (moderate) (HCC)    Hyperkalemia 04/12/2019   Chronic kidney disease due to hypertension 04/12/2019   Stage 3a chronic kidney disease (HCC) 04/12/2019   Alcohol  use, unspecified with unspecified alcohol -induced disorder 04/12/2019   Pyelonephritis 04/05/2019   Renal failure (ARF), acute on chronic 04/05/2019   Ureterolithiasis 04/05/2019   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   Presbycusis of both ears 05/27/2016   Hardening of the aorta (main artery of the heart) 02/15/2016   Neck pain 01/15/2016   Tremor 12/18/2015   Hearing loss of left ear 06/04/2015   Other specified diseases of blood and blood-forming organs 07/28/2014   Long term current use of therapeutic drug 09/09/2013   Hyperlipidemia 04/03/2013   Hemorrhoids 02/18/2013   History of infectious disease 02/18/2013   Gout 02/18/2013   Pain, dental 01/01/2013   Health maintenance examination 03/30/2011   Abnormal prostate exam 03/08/2011   Abnormal prostate specific antigen 03/08/2011   GASTROESOPHAGEAL REFLUX DISEASE, CHRONIC 12/24/2008   ACUTE BRONCHITIS 12/16/2008   Asthma with exacerbation 12/16/2008   GANGLION CYST, TENDON SHEATH 06/27/2008   FOOT PAIN, LEFT 05/23/2008   ADVERSE DRUG REACTION 03/10/2008   GOUT 03/08/2007   BPH/LUTS W/O OBSTRUCTION 03/08/2007   ELEVATED BP W/O HYPERTENSION 03/08/2007   INSOMNIA, CHRONIC, MILD 10/02/2006   CERUMEN IMPACTION, BILATERAL 10/02/2006    ONSET DATE: 11/15/2023  REFERRING DIAG: F51.937 (ICD-10-CM) - Neurogenic claudication due to lumbar spinal stenosis   THERAPY DIAG:  Unsteadiness on feet  Other abnormalities of gait and mobility  Muscle weakness (generalized)  Abnormal  posture  Rationale for Evaluation and Treatment: Rehabilitation  SUBJECTIVE:  SUBJECTIVE STATEMENT: Doing ok, right knee has been acting up a little with some instances of buckling  Pt accompanied by: NurseGLENWOOD Ivanoff  PERTINENT HISTORY: presented 11/30/23 with low back pain and weakness along with several falls since his elective PLIF L4-S1 with decompressive laminectomies on 11/15/23. Workup has revealed large what looks like epidural hematoma and wound hematoma. S/p reexploration of lumbar wound for evacuation of hematoma 8/8. PMH of anxiety, cancer, CKD III, HLD, HTN, hypothyroidism, neuropathy   PAIN:  Are you having pain? Yes: NPRS scale: 2-4/10 Pain location: LB Pain description: sore Aggravating factors: not leaning on walker, walking Relieving factors: sitting down  PRECAUTIONS: Back and Fall  RED FLAGS: None   WEIGHT BEARING RESTRICTIONS: No  FALLS: Has patient fallen in last 6 months? Yes. Number of falls 5 *Last two falls were without walker LIVING ENVIRONMENT: Lives with: lives alone and has a nurse who is staying in home 24/7 Lives in: Other townhouse Stairs: 2 steps to enter with rails on B sides that he can reach, 2 story home but is able to remain on main floor with full bedroom/bathroom  Has following equipment at home: Single point cane, Walker - 2 wheeled, shower chair, bed side commode, Grab bars, and hand held shower head, transport wheelchair   PLOF: Independent and Independent with basic ADLs  PATIENT GOALS: Balance and walking  OBJECTIVE:    TODAY'S TREATMENT: 04/01/24 Activity Comments  Single leg stance -single leg on 6 step 3x10 sec -LE lift off 3x10, 4 step -  Static multisensory balance   Dynamic balance               TODAY'S TREATMENT: 03/27/2024 Activity  Comments  Gait with SBQC 150 ft, then 50 ft Additional short distance gait with SBQC and supervision Good sequencing-cues for tall posture and heel strike-he has tendency to have forward lean  Heel toe raises Limited dflex  Gastroc stretch-runner's stretch 3 x 30 sec  Cues for technique  Stagger stance rocking forward/back Cues for technique  Resisted sidestepping along parallel bars, 3 reps Red band-cues for foot clearance  Tandem gait forward/back  Tandem march gait forward/back BUE support BUE >1 UE support  Tandem stance 2 x 15 sec, partial tandem stance More difficulty with RLE posterior  Hip abduction, hip extension x 10 reps each No resistance, then red band   HOME EXERCISE PROGRAM Access Code: [email or web address] URL: [email or web address] Date: 03/27/2024 Prepared by: [facility] - [facility]  Program Notes perform standing activiites with supervision for safety   Exercises - Sit to Stand with Arms Crossed  - 1 x daily - 5 x weekly - 2 sets - 10 reps - Mini Squat with Counter Support  - 1 x daily - 5 x weekly - 2 sets - 10 reps - Standing Shoulder Row with Anchored Resistance  - 1 x daily - 5 x weekly - 2 sets - 10 reps - Seated Anti-Rotation Press With Anchored Resistance  - 1 x daily - 5 x weekly - 2 sets - 10 reps - Seated Pelvic Tilts  - 1 x daily - 5 x weekly - 2 sets - 10 reps - Standing Gastroc Stretch at Counter  - 1 x daily - 7 x weekly - 1 sets - 3 reps - 30 sec hold - Standing Hip Abduction with Resistance at Ankles and Counter Support  - 1 x daily - 7 x weekly - 3 sets - 10 reps - Standing Romberg  to 1/2 Tandem Stance  - 1 x daily - 5 x weekly - 1 sets - 3 reps - 15 sec hold - Standing Hip Extension with Resistance at Ankles and Counter Support  - 1 x daily - 7 x weekly - 3 sets - 10 reps    PATIENT EDUCATION: Education details: Addition to HEP-see above Person educated: Patient and Corporate Treasurer Education method: Explanation and  Demonstration Education comprehension: verbalized understanding       Note: Objective measures were completed at Evaluation unless otherwise noted.  DIAGNOSTIC FINDINGS: CT 01/31/24-no abnormality  COGNITION: Overall cognitive status: Within functional limits for tasks assessed   SENSATION: Light touch: Impaired  and diminished sensation RLE  POSTURE: rounded shoulders, forward head, and posterior pelvic tilt In standing, knees crouched shaky in unsupported standing  LOWER EXTREMITY ROM:   pt slumps into posterior pelvic tilt with ROM  Active  Right Eval Left Eval  Hip flexion    Hip extension    Hip abduction    Hip adduction    Hip internal rotation    Hip external rotation    Knee flexion    Knee extension -10 -20  Ankle dorsiflexion 10 15  Ankle plantarflexion    Ankle inversion    Ankle eversion     (Blank rows = not tested)  LOWER EXTREMITY MMT:    MMT Right Eval Left Eval  Hip flexion 4 5  Hip extension    Hip abduction 4 4  Hip adduction 5 5  Hip internal rotation    Hip external rotation    Knee flexion 5 5  Knee extension 4 5  Ankle dorsiflexion 3- 3+  Ankle plantarflexion    Ankle inversion    Ankle eversion    (Blank rows = not tested)  TRANSFERS: Sit to stand: SBA  Assistive device utilized: Environmental Consultant - 2 wheeled     Stand to sit: SBA  Assistive device utilized: Environmental Consultant - 2 wheeled      GAIT: Findings: Gait Characteristics: step through pattern, knee flexed in stance- Right, knee flexed in stance- Left, and wide BOS, Distance walked: 50 ft, Assistive device utilized:Walker - 2 wheeled, and Level of assistance: CGA  FUNCTIONAL TESTS:  5 times sit to stand: 17.28 Timed up and go (TUG): 24.97 sec 10 meter walk test: 18.28 sec (1.79 ft/sec) Berg Balance: 27/56                                                                                                                                    TREATMENT DATE: 02/16/2024    PATIENT  EDUCATION: Education details: Eval results, POC Person educated: Patient and Nurse, adult Education method: Explanation Education comprehension: verbalized understanding  HOME EXERCISE PROGRAM: At home, he is standing at counter:  marching, hip kicks side, knee bends, heel raises  GOALS: Goals reviewed with patient? Yes  SHORT TERM GOALS: Target date: 03/15/2024  Pt  will be independent with HEP for improved strength, balance, gait. Baseline: Goal status: MET 03/20/24  2.  Pt will improve 5x sit<>stand to less than or equal to 15 sec to demonstrate improved functional strength and transfer efficiency. Baseline: 17.28 sec; 14.64 sec before cueing 03/20/24  Goal status: MET 03/20/24   3.  Pt will improve Berg score to at least 37/56 to decrease fall risk. Baseline: 27/56; 43/56 03/20/24 Goal status: MET 03/20/24   LONG TERM GOALS: Target date: 04/12/2024  Pt will be independent with HEP for improved strength, balance, gait. Baseline:  Goal status: IN PROGRESS  2.  Pt will improve 5x sit<>stand to less than or equal to 12 sec to demonstrate improved functional strength and transfer efficiency. Baseline: 17.28 sec;  Goal status: IN PROGRESS   3.  Pt will improve Berg score to at least 48/56 to decrease fall risk. Baseline: 27/56 Goal status: IN PROGRESS  4.  Pt will improve TUG score to less than or equal to 13 sec for decreased fall risk. Baseline: 24.97 sec Goal status: IN PROGRESS  5.  Pt will improve gait velocity to at least 2.3 ft/sec for improved gait efficiency and safety. Baseline: 1.79 ft/sec Goal status:IN PROGRESS  6.  Pt will verbalize understanding of fall prevention in home environment.  Baseline:  Goal status: IN PROGRESS  ASSESSMENT:  CLINICAL IMPRESSION: Continued with static standing balance activities with emphasis on improving single leg support with visual cues to promote R>L extension w/ cues for engagement and good correction but requiring  cues for awareness.  Static multisensory balance to improve proprioceptive awareness for limits of stability w/ tendency for anterior LOB.  Dynamic standing balance activities to promote postural control, single limb support, and LE engagement for assuming single limb stance to reduce risk for LE instability/knee buckling w/ good effect CGA-SBA for unsupported standing while stepping over obstacle. Continued sessions to advance POC details to improve mobility and reduce risk for falls.  OBJECTIVE IMPAIRMENTS: Abnormal gait, decreased balance, decreased mobility, difficulty walking, decreased strength, impaired flexibility, and postural dysfunction.   ACTIVITY LIMITATIONS: standing, transfers, and locomotion level  PARTICIPATION LIMITATIONS: meal prep, cleaning, laundry, shopping, community activity, and travel  PERSONAL FACTORS: 3+ comorbidities: see above are also affecting patient's functional outcome.   REHAB POTENTIAL: Good  CLINICAL DECISION MAKING: Evolving/moderate complexity  EVALUATION COMPLEXITY: Moderate  PLAN:  PT FREQUENCY: 2x/week  PT DURATION: 8 weeks plus eval  PLANNED INTERVENTIONS: 97750- Physical Performance Testing, 97110-Therapeutic exercises, 97530- Therapeutic activity, 97112- Neuromuscular re-education, 97535- Self Care, 02859- Manual therapy, [phone]- Gait training, Patient/Family education, and Balance training  PLAN FOR NEXT SESSION: Review updates to HEP; continue gait training with cane, adding in additional balance challenges; core strengthening and hip stability exercises   4:22 PM, 04/01/24 M. Kelly Sade Hollon, PT, DPT Physical Therapist- [place] Office Number: [phone]

## 2024-04-02 NOTE — Progress Notes (Unsigned)
 Subjective:    Patient ID: Miguel Williams, male    DOB: 1945/08/26, 78 y.o.   MRN: [id number]  HPI   Pain Inventory Average Pain {NUMBERS; 0-10:5044} Pain Right Now {NUMBERS; 0-10:5044} My pain is {PAIN DESCRIPTION:21022940}  In the last 24 hours, has pain interfered with the following? General activity {NUMBERS; 0-10:5044} Relation with others {NUMBERS; 0-10:5044} Enjoyment of life {NUMBERS; 0-10:5044} What TIME of day is your pain at its worst? {time of day:24191} Sleep (in general) {BHH GOOD/FAIR/POOR:22877}  Pain is worse with: {ACTIVITIES:21022942} Pain improves with: {PAIN IMPROVES TPUY:78977056} Relief from Meds: {NUMBERS; 0-10:5044}  Family History  Problem Relation Age of Onset   Multiple myeloma Mother    Lung cancer Father    Social History   Socioeconomic History   Marital status: Single    Spouse name: Not on file   Number of children: 0   Years of education: college   Highest education level: Not on file  Occupational History    Employer: [employer]   Occupation: retired    Comment: Counsellor  Tobacco Use   Smoking status: Never   Smokeless tobacco: Never  Vaping Use   Vaping status: Never Used  Substance and Sexual Activity   Alcohol  use: Not Currently    Comment: 3oz. of wine every other day as of 10/2023   Drug use: Not Currently   Sexual activity: Not Currently    Partners: Female  Other Topics Concern   Not on file  Social History Narrative   Left handed    Social Drivers of Health   Financial Resource Strain: Not on file  Food Insecurity: No Food Insecurity (12/01/2023)   Hunger Vital Sign    Worried About Running Out of Food in the Last Year: Never true    Ran Out of Food in the Last Year: Never true  Transportation Needs: No Transportation Needs (12/01/2023)   PRAPARE - Administrator, Civil Service (Medical): No    Lack of Transportation (Non-Medical): No  Physical Activity: Not on file  Stress:  Not on file  Social Connections: Unknown (12/01/2023)   Social Connection and Isolation Panel    Frequency of Communication with Friends and Family: Three times a week    Frequency of Social Gatherings with Friends and Family: Three times a week    Attends Religious Services: More than 4 times per year    Active Member of Clubs or Organizations: Yes    Attends Banker Meetings: More than 4 times per year    Marital Status: Patient declined   Past Surgical History:  Procedure Laterality Date   CATARACT EXTRACTION W/ INTRAOCULAR LENS IMPLANT Bilateral    COLONOSCOPY  04/2023   COLONOSCOPY W/ ENDOSCOPIC US      CYSTOSCOPY WITH FULGERATION N/A 03/09/2023   Procedure: CYSTOSCOPY WITH FULGERATION WITH CLOT EVACUATION;  Surgeon: Devere Lonni Righter, MD;  Location: [facility];  Service: Urology;  Laterality: N/A;   CYSTOSCOPY WITH URETHRAL DILATATION N/A 03/08/2023   Procedure: CYSTOSCOPY;  Surgeon: Devere Lonni Righter, MD;  Location: [facility];  Service: Urology;  Laterality: N/A;  30 MINUTES   ESOPHAGOGASTRODUODENOSCOPY     LUMBAR WOUND DEBRIDEMENT N/A 12/01/2023   Procedure: LUMBAR WOUND DEBRIDEMENT;  Surgeon: [name], MD;  Location: [facility] OR;  Service: Neurosurgery;  Laterality: N/A;   TONSILLECTOMY  04/25/1950   Past Surgical History:  Procedure Laterality Date   CATARACT EXTRACTION W/ INTRAOCULAR LENS IMPLANT Bilateral    COLONOSCOPY  04/2023   COLONOSCOPY W/ ENDOSCOPIC US      CYSTOSCOPY WITH FULGERATION N/A 03/09/2023   Procedure: CYSTOSCOPY WITH FULGERATION WITH CLOT EVACUATION;  Surgeon: Devere Lonni Righter, MD;  Location: [facility];  Service: Urology;  Laterality: N/A;   CYSTOSCOPY WITH URETHRAL DILATATION N/A 03/08/2023   Procedure: CYSTOSCOPY;  Surgeon: Devere Lonni Righter, MD;  Location: [facility];  Service: Urology;  Laterality: N/A;  30 MINUTES   ESOPHAGOGASTRODUODENOSCOPY     LUMBAR WOUND DEBRIDEMENT N/A 12/01/2023    Procedure: LUMBAR WOUND DEBRIDEMENT;  Surgeon: [name], MD;  Location: [facility] OR;  Service: Neurosurgery;  Laterality: N/A;   TONSILLECTOMY  04/25/1950   Past Medical History:  Diagnosis Date   Abdominal pain, lower    Agitation    Allergy, unspecified, initial encounter    Anxiety disorder    Arthritis    Asthma    As a child   Bipolar disorder [facility])    Patient denies   Cancer [facility])    Bladder   Cancer (HCC)    Skin cancer   Chronic kidney disease, stage III (moderate) (HCC)    Chronic kidney disease, stage III (moderate) (HCC)    COPD (chronic obstructive pulmonary disease) (HCC)    Pt denies (10/2023)   Coronary atherosclerosis of native coronary artery    Esophageal reflux    Fatigue    Fungal granuloma    Hip pain    History of 2019 novel coronavirus disease (COVID-19)    History of endoscopy 02/00/2011   History of kidney stones    Hypertension    Hypo-osmolality and hyponatremia    Hypokalemia    Hypothyroidism    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 now has 3oz of wine every other day (as of 10/2023)   Umbilical hernia    Unspecified fall, subsequent encounter    Vitamin D deficiency, unspecified    Wernicke's encephalopathy 06/23/2019   There were no vitals taken for this visit.  Opioid Risk Score:   Fall Risk Score:  `1  Depression screen PHQ 2/9     02/05/2024    2:00 PM 01/02/2024    2:40 PM  Depression screen PHQ 2/9  Decreased Interest 0 0  Down, Depressed, Hopeless  0  PHQ - 2  Score 0 0  Altered sleeping  0  Tired, decreased energy  0  Change in appetite  0  Feeling bad or failure about yourself   0  Trouble concentrating  0  Moving slowly or fidgety/restless  0  Suicidal thoughts  0  PHQ-9 Score  0      Data saved with a previous flowsheet row definition    Review of Systems     Objective:   Physical Exam        Assessment & Plan:

## 2024-04-03 ENCOUNTER — Encounter: Payer: Self-pay | Admitting: Physical Medicine and Rehabilitation

## 2024-04-03 ENCOUNTER — Encounter: Attending: Physical Medicine and Rehabilitation | Admitting: Physical Medicine and Rehabilitation

## 2024-04-03 VITALS — BP 139/89 | HR 83 | Ht 67.0 in | Wt 208.0 lb

## 2024-04-03 DIAGNOSIS — E512 Wernicke's encephalopathy: Secondary | ICD-10-CM | POA: Insufficient documentation

## 2024-04-03 DIAGNOSIS — M76891 Other specified enthesopathies of right lower limb, excluding foot: Secondary | ICD-10-CM | POA: Insufficient documentation

## 2024-04-03 DIAGNOSIS — M5416 Radiculopathy, lumbar region: Secondary | ICD-10-CM

## 2024-04-03 DIAGNOSIS — M48062 Spinal stenosis, lumbar region with neurogenic claudication: Secondary | ICD-10-CM | POA: Diagnosis present

## 2024-04-03 DIAGNOSIS — R413 Other amnesia: Secondary | ICD-10-CM

## 2024-04-03 DIAGNOSIS — M1711 Unilateral primary osteoarthritis, right knee: Secondary | ICD-10-CM | POA: Insufficient documentation

## 2024-04-03 NOTE — Patient Instructions (Signed)
 Continue PT, get driving evaluation  Get knee xrays - if amenable to injection we will schedule you for a right knee steroid injections  Use lidocaine  patches on your back and heat after therapies  Follow up in 2 months

## 2024-04-04 ENCOUNTER — Ambulatory Visit

## 2024-04-04 ENCOUNTER — Telehealth: Payer: Self-pay | Admitting: Physical Medicine and Rehabilitation

## 2024-04-04 DIAGNOSIS — M6281 Muscle weakness (generalized): Secondary | ICD-10-CM

## 2024-04-04 DIAGNOSIS — R2681 Unsteadiness on feet: Secondary | ICD-10-CM | POA: Diagnosis not present

## 2024-04-04 DIAGNOSIS — R293 Abnormal posture: Secondary | ICD-10-CM

## 2024-04-04 DIAGNOSIS — R2689 Other abnormalities of gait and mobility: Secondary | ICD-10-CM

## 2024-04-04 NOTE — Addendum Note (Signed)
 Addended by: EMELINE SEARCH on: 04/04/2024 11:35 AM   Modules accepted: Orders

## 2024-04-04 NOTE — Telephone Encounter (Signed)
 Patient called asking about an order for the xray discussed at the last visit

## 2024-04-04 NOTE — Therapy (Signed)
 OUTPATIENT PHYSICAL THERAPY NEURO TREATMENT   Patient Name: Miguel Williams MRN: [id number] DOB:07/13/1945, 78 y.o., male Today's Date: 04/04/2024   PCP: Shayne Anes, MD REFERRING PROVIDER: Emeline Joesph BROCKS, DO      END OF SESSION:  PT End of Session - 04/04/24 1534     Visit Number 12    Number of Visits 17    Date for Recertification  04/12/24    Authorization Type [employer] Medicare    PT Start Time 1530    PT Stop Time 1615    PT Time Calculation (min) 45 min    Equipment Utilized During Treatment Gait belt    Activity Tolerance Patient tolerated treatment well    Behavior During Therapy WFL for tasks assessed/performed                Past Medical History:  Diagnosis Date   Abdominal pain, lower    Agitation    Allergy, unspecified, initial encounter    Anxiety disorder    Arthritis    Asthma    As a child   Bipolar disorder (HCC)    Patient denies   Cancer (HCC)    Bladder   Cancer (HCC)    Skin cancer   Chronic kidney disease, stage III (moderate) (HCC)    Chronic kidney disease, stage III (moderate) (HCC)    COPD (chronic obstructive pulmonary disease) (HCC)    Pt denies (10/2023)   Coronary atherosclerosis of native coronary artery    Esophageal reflux    Fatigue    Fungal granuloma    Hip pain    History of 2019 novel coronavirus disease (COVID-19)    History of endoscopy 02/00/2011   History of kidney stones    Hypertension    Hypo-osmolality and hyponatremia    Hypokalemia    Hypothyroidism    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 now has 3oz of wine every other day (as of 10/2023)   Umbilical hernia    Unspecified fall, subsequent encounter    Vitamin D deficiency, unspecified    Wernicke's encephalopathy 06/23/2019   Past Surgical History:  Procedure Laterality Date   CATARACT EXTRACTION W/ INTRAOCULAR LENS IMPLANT Bilateral    COLONOSCOPY  04/2023   COLONOSCOPY W/ ENDOSCOPIC US      CYSTOSCOPY WITH FULGERATION N/A 03/09/2023   Procedure: CYSTOSCOPY WITH FULGERATION WITH CLOT EVACUATION;  Surgeon: Devere Lonni Righter, MD;  Location: [facility];  Service: Urology;  Laterality: N/A;   CYSTOSCOPY WITH URETHRAL DILATATION N/A 03/08/2023   Procedure: CYSTOSCOPY;  Surgeon: Devere Lonni Righter, MD;  Location: [facility];  Service: Urology;  Laterality: N/A;  30 MINUTES   ESOPHAGOGASTRODUODENOSCOPY     LUMBAR WOUND DEBRIDEMENT N/A 12/01/2023   Procedure: LUMBAR WOUND DEBRIDEMENT;  Surgeon: [name], MD;  Location: [facility];  Service: Neurosurgery;  Laterality: N/A;   TONSILLECTOMY  04/25/1950   Patient Active Problem List  Diagnosis Date Noted   Arthritis of right knee 04/03/2024   Popliteus tendinitis of right lower extremity 04/03/2024   Right cervical radiculopathy 02/05/2024   Constipation 12/20/2023   Acute on chronic back pain 12/20/2023   Neuropathy 12/20/2023   Depression with anxiety 12/08/2023   Neurogenic claudication due to lumbar spinal stenosis 12/05/2023   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   Lumbar epidural mass (HCC) 12/01/2023   Spinal stenosis of lumbar region 11/15/2023   Family history of alcoholism 11/06/2023   Headache, occipital 11/06/2023   History of pancreatitis 11/06/2023   Hyperparathyroidism 11/06/2023   Hypertension 11/06/2023   Joint swelling 11/06/2023   Joint stiffness 11/06/2023   Laryngopharyngeal reflux 11/06/2023   Lumbar polyradiculopathy 11/06/2023   Memory loss  11/06/2023   Anemia, blood loss 11/06/2023   Symptomatic anemia 08/10/2023   Anemia 08/09/2023   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 11/27/2022   Urinary tract infection without hematuria 11/09/2022   Bladder tumor 09/12/2022   Nausea and vomiting 09/12/2022   Kidney stones 12/11/2021   Scalp laceration 07/01/2021   Wernicke's encephalopathy 06/23/2019   Chronic kidney disease, stage III (moderate) (HCC)    Hyperkalemia 04/12/2019   Chronic kidney disease due to hypertension 04/12/2019   Stage 3a chronic kidney disease (HCC) 04/12/2019   Alcohol  use, unspecified with unspecified alcohol -induced disorder 04/12/2019   Pyelonephritis 04/05/2019   Renal failure (ARF), acute on chronic 04/05/2019   Ureterolithiasis 04/05/2019   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   Presbycusis of both ears 05/27/2016   Hardening of the aorta (main artery of the heart) 02/15/2016   Neck pain 01/15/2016   Tremor 12/18/2015   Hearing loss of left ear 06/04/2015   Other specified diseases of blood and blood-forming organs 07/28/2014   Long term current use of therapeutic drug 09/09/2013   Hyperlipidemia 04/03/2013   Hemorrhoids 02/18/2013   History of infectious disease 02/18/2013   Gout 02/18/2013   Pain, dental 01/01/2013   Health maintenance examination 03/30/2011   Abnormal prostate exam 03/08/2011   Abnormal prostate specific antigen 03/08/2011   GASTROESOPHAGEAL REFLUX DISEASE, CHRONIC 12/24/2008   ACUTE BRONCHITIS 12/16/2008   Asthma with exacerbation 12/16/2008   GANGLION CYST, TENDON SHEATH 06/27/2008   FOOT PAIN, LEFT 05/23/2008   ADVERSE DRUG REACTION 03/10/2008   GOUT 03/08/2007   BPH/LUTS W/O OBSTRUCTION 03/08/2007   ELEVATED BP W/O HYPERTENSION 03/08/2007   INSOMNIA, CHRONIC, MILD 10/02/2006   CERUMEN IMPACTION, BILATERAL 10/02/2006    ONSET DATE: 11/15/2023  REFERRING DIAG: F51.937 (ICD-10-CM) - Neurogenic claudication due to lumbar spinal stenosis   THERAPY DIAG:   Unsteadiness on feet  Other abnormalities of gait and mobility  Muscle weakness (generalized)  Abnormal posture  Rationale for Evaluation and Treatment: Rehabilitation  SUBJECTIVE:  SUBJECTIVE STATEMENT: Doing ok, right knee has still been sore  Pt accompanied by: NurseGLENWOOD Ivanoff  PERTINENT HISTORY: presented 11/30/23 with low back pain and weakness along with several falls since his elective PLIF L4-S1 with decompressive laminectomies on 11/15/23. Workup has revealed large what looks like epidural hematoma and wound hematoma. S/p reexploration of lumbar wound for evacuation of hematoma 8/8. PMH of anxiety, cancer, CKD III, HLD, HTN, hypothyroidism, neuropathy   PAIN:  Are you having pain? Yes: NPRS scale: 2-4/10 Pain location: LB Pain description: sore Aggravating factors: not leaning on walker, walking Relieving factors: sitting down  PRECAUTIONS: Back and Fall  RED FLAGS: None   WEIGHT BEARING RESTRICTIONS: No  FALLS: Has patient fallen in last 6 months? Yes. Number of falls 5 *Last two falls were without walker LIVING ENVIRONMENT: Lives with: lives alone and has a nurse who is staying in home 24/7 Lives in: Other townhouse Stairs: 2 steps to enter with rails on B sides that he can reach, 2 story home but is able to remain on main floor with full bedroom/bathroom  Has following equipment at home: Single point cane, Walker - 2 wheeled, shower chair, bed side commode, Grab bars, and hand held shower head, transport wheelchair   PLOF: Independent and Independent with basic ADLs  PATIENT GOALS: Balance and walking  OBJECTIVE:   TODAY'S TREATMENT: 04/04/24 Activity Comments  QS, SLR, supine hip abd, bent knee fall out 3x10 For HEP development to address knee pain  Sidelying hip abd  Attempts, able to get partial range against gravity w/ assis tto stabilize pelvis  Sidestepping x 1.5 min   Mini-squat mid-range iso holds Cues for hip ext rotation, 4 sec before fatigue and loss of control               TODAY'S TREATMENT: 04/01/24 Activity Comments  Single leg stance -single leg on 6 step 3x10 sec -LE lift off 3x10, 4 step -  Static multisensory balance   Dynamic balance               TODAY'S TREATMENT: 03/27/2024 Activity Comments  Gait with SBQC 150 ft, then 50 ft Additional short distance gait with SBQC and supervision Good sequencing-cues for tall posture and heel strike-he has tendency to have forward lean  Heel toe raises Limited dflex  Gastroc stretch-runner's stretch 3 x 30 sec  Cues for technique  Stagger stance rocking forward/back Cues for technique  Resisted sidestepping along parallel bars, 3 reps Red band-cues for foot clearance  Tandem gait forward/back  Tandem march gait forward/back BUE support BUE >1 UE support  Tandem stance 2 x 15 sec, partial tandem stance More difficulty with RLE posterior  Hip abduction, hip extension x 10 reps each No resistance, then red band   HOME EXERCISE PROGRAM Access Code: [email or web address] URL: [email or web address] Date: 03/27/2024 Prepared by: [facility] Outpatient  [facility] - [facility]  Program Notes perform standing activiites with supervision for safety   Exercises - Sit to Stand with Arms Crossed  - 1 x daily - 5 x weekly - 2 sets - 10 reps - Mini Squat with Counter Support  - 1 x daily - 5 x weekly - 2 sets - 10 reps - Standing Shoulder Row with Anchored Resistance  - 1 x daily - 5 x weekly - 2 sets - 10 reps - Seated Anti-Rotation Press With Anchored Resistance  - 1 x daily - 5 x weekly - 2 sets - 10 reps - Seated Pelvic  Tilts  - 1 x daily - 5 x weekly - 2 sets - 10 reps - Standing Gastroc Stretch at Counter  - 1 x daily - 7 x weekly - 1 sets - 3 reps - 30 sec hold - Standing Hip  Abduction with Resistance at Ankles and Counter Support  - 1 x daily - 7 x weekly - 3 sets - 10 reps - Standing Romberg to 1/2 Tandem Stance  - 1 x daily - 5 x weekly - 1 sets - 3 reps - 15 sec hold - Standing Hip Extension with Resistance at Ankles and Counter Support  - 1 x daily - 7 x weekly - 3 sets - 10 reps - Supine Quad Set  - 1 x daily - 7 x weekly - 3 sets - 10 reps - 2 sec hold - Active Straight Leg Raise with Quad Set  - 1 x daily - 7 x weekly - 3 sets - 10 reps - Supine Hip Abduction with Resistance at Ankles  - 1 x daily - 7 x weekly - 3 sets - 10 reps - Hooklying Single Leg Bent Knee Fallouts with Resistance  - 1 x daily - 7 x weekly - 3 sets - 10 reps    PATIENT EDUCATION: Education details: Addition to HEP-see above Person educated: Patient and Corporate Treasurer Education method: Explanation and Demonstration Education comprehension: verbalized understanding       Note: Objective measures were completed at Evaluation unless otherwise noted.  DIAGNOSTIC FINDINGS: CT 01/31/24-no abnormality  COGNITION: Overall cognitive status: Within functional limits for tasks assessed   SENSATION: Light touch: Impaired  and diminished sensation RLE  POSTURE: rounded shoulders, forward head, and posterior pelvic tilt In standing, knees crouched shaky in unsupported standing  LOWER EXTREMITY ROM:   pt slumps into posterior pelvic tilt with ROM  Active  Right Eval Left Eval  Hip flexion    Hip extension    Hip abduction    Hip adduction    Hip internal rotation    Hip external rotation    Knee flexion    Knee extension -10 -20  Ankle dorsiflexion 10 15  Ankle plantarflexion    Ankle inversion    Ankle eversion     (Blank rows = not tested)  LOWER EXTREMITY MMT:    MMT Right Eval Left Eval  Hip flexion 4 5  Hip extension    Hip abduction 4 4  Hip adduction 5 5  Hip internal rotation    Hip external rotation    Knee flexion 5 5  Knee extension 4 5  Ankle  dorsiflexion 3- 3+  Ankle plantarflexion    Ankle inversion    Ankle eversion    (Blank rows = not tested)  TRANSFERS: Sit to stand: SBA  Assistive device utilized: Environmental Consultant - 2 wheeled     Stand to sit: SBA  Assistive device utilized: Environmental Consultant - 2 wheeled      GAIT: Findings: Gait Characteristics: step through pattern, knee flexed in stance- Right, knee flexed in stance- Left, and wide BOS, Distance walked: 50 ft, Assistive device utilized:Walker - 2 wheeled, and Level of assistance: CGA  FUNCTIONAL TESTS:  5 times sit to stand: 17.28 Timed up and go (TUG): 24.97 sec 10 meter walk test: 18.28 sec (1.79 ft/sec) Berg Balance: 27/56  TREATMENT DATE: 02/16/2024    PATIENT EDUCATION: Education details: Eval results, POC Person educated: Patient and Nurse, adult Education method: Explanation Education comprehension: verbalized understanding  HOME EXERCISE PROGRAM: At home, he is standing at counter:  marching, hip kicks side, knee bends, heel raises  GOALS: Goals reviewed with patient? Yes  SHORT TERM GOALS: Target date: 03/15/2024  Pt will be independent with HEP for improved strength, balance, gait. Baseline: Goal status: MET 03/20/24  2.  Pt will improve 5x sit<>stand to less than or equal to 15 sec to demonstrate improved functional strength and transfer efficiency. Baseline: 17.28 sec; 14.64 sec before cueing 03/20/24  Goal status: MET 03/20/24   3.  Pt will improve Berg score to at least 37/56 to decrease fall risk. Baseline: 27/56; 43/56 03/20/24 Goal status: MET 03/20/24   LONG TERM GOALS: Target date: 04/12/2024  Pt will be independent with HEP for improved strength, balance, gait. Baseline:  Goal status: IN PROGRESS  2.  Pt will improve 5x sit<>stand to less than or equal to 12 sec to demonstrate improved functional strength  and transfer efficiency. Baseline: 17.28 sec;  Goal status: IN PROGRESS   3.  Pt will improve Berg score to at least 48/56 to decrease fall risk. Baseline: 27/56 Goal status: IN PROGRESS  4.  Pt will improve TUG score to less than or equal to 13 sec for decreased fall risk. Baseline: 24.97 sec Goal status: IN PROGRESS  5.  Pt will improve gait velocity to at least 2.3 ft/sec for improved gait efficiency and safety. Baseline: 1.79 ft/sec Goal status:IN PROGRESS  6.  Pt will verbalize understanding of fall prevention in home environment.  Baseline:  Goal status: IN PROGRESS  ASSESSMENT:  CLINICAL IMPRESSION: Reports ongoing retro-patellar knee pain.  Instructed in HEP specific to right knee pain with techniques to improve quad and hip abd engagement for improved stance control w/ notable weakness right hip abduction of 2+/5.  Techniques to promote active hip abd and extension engagement with sit-stand/squat-pause drill with loss of balance/control initially after 3-5 sec improved to 11 sec end of session using mirror for feedback and maintaining hip external rotation.  Reports decreased knee pain end of session OBJECTIVE IMPAIRMENTS: Abnormal gait, decreased balance, decreased mobility, difficulty walking, decreased strength, impaired flexibility, and postural dysfunction.   ACTIVITY LIMITATIONS: standing, transfers, and locomotion level  PARTICIPATION LIMITATIONS: meal prep, cleaning, laundry, shopping, community activity, and travel  PERSONAL FACTORS: 3+ comorbidities: see above are also affecting patient's functional outcome.   REHAB POTENTIAL: Good  CLINICAL DECISION MAKING: Evolving/moderate complexity  EVALUATION COMPLEXITY: Moderate  PLAN:  PT FREQUENCY: 2x/week  PT DURATION: 8 weeks plus eval  PLANNED INTERVENTIONS: 97750- Physical Performance Testing, 97110-Therapeutic exercises, 97530- Therapeutic activity, 97112- Neuromuscular re-education, 97535- Self Care,  [id number]- Manual therapy, [phone]- Gait training, Patient/Family education, and Balance training  PLAN FOR NEXT SESSION: Review updates to HEP; continue gait training with cane, adding in additional balance challenges; core strengthening and hip stability exercises   3:34 PM, 04/04/2024 M. Kelly Skipper Dacosta, PT, DPT Physical Therapist- [place] Office Number: [phone]

## 2024-04-04 NOTE — Telephone Encounter (Signed)
 Order resent

## 2024-04-05 ENCOUNTER — Ambulatory Visit
Admission: RE | Admit: 2024-04-05 | Discharge: 2024-04-05 | Disposition: A | Source: Ambulatory Visit | Attending: Physical Medicine and Rehabilitation

## 2024-04-05 DIAGNOSIS — M1711 Unilateral primary osteoarthritis, right knee: Secondary | ICD-10-CM

## 2024-04-05 DIAGNOSIS — M76891 Other specified enthesopathies of right lower limb, excluding foot: Secondary | ICD-10-CM

## 2024-04-08 ENCOUNTER — Encounter: Payer: Self-pay | Admitting: Physical Therapy

## 2024-04-08 ENCOUNTER — Ambulatory Visit: Admitting: Physical Therapy

## 2024-04-08 DIAGNOSIS — R2681 Unsteadiness on feet: Secondary | ICD-10-CM

## 2024-04-08 DIAGNOSIS — R2689 Other abnormalities of gait and mobility: Secondary | ICD-10-CM

## 2024-04-08 DIAGNOSIS — M6281 Muscle weakness (generalized): Secondary | ICD-10-CM

## 2024-04-08 NOTE — Therapy (Signed)
 OUTPATIENT PHYSICAL THERAPY NEURO TREATMENT   Patient Name: Miguel Williams MRN: [id number] DOB:01-24-1946, 78 y.o., male Today's Date: 04/08/2024   PCP: Shayne Anes, MD REFERRING PROVIDER: Emeline Joesph BROCKS, DO      END OF SESSION:  PT End of Session - 04/08/24 1404     Visit Number 13    Number of Visits 17    Date for Recertification  04/12/24    Authorization Type [employer] Medicare    PT Start Time 1404    PT Stop Time 1444    PT Time Calculation (min) 40 min    Equipment Utilized During Treatment Gait belt    Activity Tolerance Patient tolerated treatment well    Behavior During Therapy WFL for tasks assessed/performed                 Past Medical History:  Diagnosis Date   Abdominal pain, lower    Agitation    Allergy, unspecified, initial encounter    Anxiety disorder    Arthritis    Asthma    As a child   Bipolar disorder (HCC)    Patient denies   Cancer (HCC)    Bladder   Cancer (HCC)    Skin cancer   Chronic kidney disease, stage III (moderate) (HCC)    Chronic kidney disease, stage III (moderate) (HCC)    COPD (chronic obstructive pulmonary disease) (HCC)    Pt denies (10/2023)   Coronary atherosclerosis of native coronary artery    Esophageal reflux    Fatigue    Fungal granuloma    Hip pain    History of 2019 novel coronavirus disease (COVID-19)    History of endoscopy 02/00/2011   History of kidney stones    Hypertension    Hypo-osmolality and hyponatremia    Hypokalemia    Hypothyroidism    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 now has 3oz of wine every other day (as of 10/2023)   Umbilical hernia    Unspecified fall, subsequent encounter    Vitamin D deficiency, unspecified    Wernicke's encephalopathy 06/23/2019   Past Surgical History:  Procedure Laterality Date   CATARACT EXTRACTION W/ INTRAOCULAR LENS IMPLANT Bilateral    COLONOSCOPY  04/2023   COLONOSCOPY W/ ENDOSCOPIC US      CYSTOSCOPY WITH FULGERATION N/A 03/09/2023   Procedure: CYSTOSCOPY WITH FULGERATION WITH CLOT EVACUATION;  Surgeon: Devere Lonni Righter, MD;  Location: [facility];  Service: Urology;  Laterality: N/A;   CYSTOSCOPY WITH URETHRAL DILATATION N/A 03/08/2023   Procedure: CYSTOSCOPY;  Surgeon: Devere Lonni Righter, MD;  Location: [facility];  Service: Urology;  Laterality: N/A;  30 MINUTES   ESOPHAGOGASTRODUODENOSCOPY     LUMBAR WOUND DEBRIDEMENT N/A 12/01/2023   Procedure: LUMBAR WOUND DEBRIDEMENT;  Surgeon: [name], MD;  Location: [facility];  Service: Neurosurgery;  Laterality: N/A;   TONSILLECTOMY  04/25/1950   Patient Active Problem List  Diagnosis Date Noted   Arthritis of right knee 04/03/2024   Popliteus tendinitis of right lower extremity 04/03/2024   Right cervical radiculopathy 02/05/2024   Constipation 12/20/2023   Acute on chronic back pain 12/20/2023   Neuropathy 12/20/2023   Depression with anxiety 12/08/2023   Neurogenic claudication due to lumbar spinal stenosis 12/05/2023   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   Lumbar epidural mass (HCC) 12/01/2023   Spinal stenosis of lumbar region 11/15/2023   Family history of alcoholism 11/06/2023   Headache, occipital 11/06/2023   History of pancreatitis 11/06/2023   Hyperparathyroidism 11/06/2023   Hypertension 11/06/2023   Joint swelling 11/06/2023   Joint stiffness 11/06/2023   Laryngopharyngeal reflux 11/06/2023   Lumbar polyradiculopathy 11/06/2023   Memory loss  11/06/2023   Anemia, blood loss 11/06/2023   Symptomatic anemia 08/10/2023   Anemia 08/09/2023   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 11/27/2022   Urinary tract infection without hematuria 11/09/2022   Bladder tumor 09/12/2022   Nausea and vomiting 09/12/2022   Kidney stones 12/11/2021   Scalp laceration 07/01/2021   Wernicke's encephalopathy 06/23/2019   Chronic kidney disease, stage III (moderate) (HCC)    Hyperkalemia 04/12/2019   Chronic kidney disease due to hypertension 04/12/2019   Stage 3a chronic kidney disease (HCC) 04/12/2019   Alcohol  use, unspecified with unspecified alcohol -induced disorder 04/12/2019   Pyelonephritis 04/05/2019   Renal failure (ARF), acute on chronic 04/05/2019   Ureterolithiasis 04/05/2019   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   Presbycusis of both ears 05/27/2016   Hardening of the aorta (main artery of the heart) 02/15/2016   Neck pain 01/15/2016   Tremor 12/18/2015   Hearing loss of left ear 06/04/2015   Other specified diseases of blood and blood-forming organs 07/28/2014   Long term current use of therapeutic drug 09/09/2013   Hyperlipidemia 04/03/2013   Hemorrhoids 02/18/2013   History of infectious disease 02/18/2013   Gout 02/18/2013   Pain, dental 01/01/2013   Health maintenance examination 03/30/2011   Abnormal prostate exam 03/08/2011   Abnormal prostate specific antigen 03/08/2011   GASTROESOPHAGEAL REFLUX DISEASE, CHRONIC 12/24/2008   ACUTE BRONCHITIS 12/16/2008   Asthma with exacerbation 12/16/2008   GANGLION CYST, TENDON SHEATH 06/27/2008   FOOT PAIN, LEFT 05/23/2008   ADVERSE DRUG REACTION 03/10/2008   GOUT 03/08/2007   BPH/LUTS W/O OBSTRUCTION 03/08/2007   ELEVATED BP W/O HYPERTENSION 03/08/2007   INSOMNIA, CHRONIC, MILD 10/02/2006   CERUMEN IMPACTION, BILATERAL 10/02/2006    ONSET DATE: 11/15/2023  REFERRING DIAG: F51.937 (ICD-10-CM) - Neurogenic claudication due to lumbar spinal stenosis   THERAPY DIAG:  Muscle  weakness (generalized)  Unsteadiness on feet  Other abnormalities of gait and mobility  Rationale for Evaluation and Treatment: Rehabilitation  SUBJECTIVE:  SUBJECTIVE STATEMENT: I did the have x-ray last week, waiting to hear back.  The R knee pain is not as bad when I use the cane.  Pt accompanied by: NurseGLENWOOD Ivanoff  PERTINENT HISTORY: presented 11/30/23 with low back pain and weakness along with several falls since his elective PLIF L4-S1 with decompressive laminectomies on 11/15/23. Workup has revealed large what looks like epidural hematoma and wound hematoma. S/p reexploration of lumbar wound for evacuation of hematoma 8/8. PMH of anxiety, cancer, CKD III, HLD, HTN, hypothyroidism, neuropathy   PAIN:  Are you having pain? Yes: NPRS scale: 3-4/10 Pain location: LB, R knee Pain description: sore Aggravating factors: not leaning on walker, walking Relieving factors: sitting down  PRECAUTIONS: Back and Fall  RED FLAGS: None   WEIGHT BEARING RESTRICTIONS: No  FALLS: Has patient fallen in last 6 months? Yes. Number of falls 5 *Last two falls were without walker LIVING ENVIRONMENT: Lives with: lives alone and has a nurse who is staying in home 24/7 Lives in: Other townhouse Stairs: 2 steps to enter with rails on B sides that he can reach, 2 story home but is able to remain on main floor with full bedroom/bathroom  Has following equipment at home: Single point cane, Walker - 2 wheeled, shower chair, bed side commode, Grab bars, and hand held shower head, transport wheelchair   PLOF: Independent and Independent with basic ADLs  PATIENT GOALS: Balance and walking  OBJECTIVE:    TODAY'S TREATMENT: 04/08/2024 Activity Comments  Review of HEP: Supine quad sets x 10, 3  RLE SLR x 10 Hip abduct  green band Clamshell green band Good return demo-min cues for technique; no pain  Mini-squat mid-range iso holds, 2 x 5 reps from mat, from chair Cues for hip ext rotation, 5 sec and cues for control  Sidestepping in parallel bars Cues for RLE foot clearance  Forward/back walking in parallel bars Cues for step length  Negotiating around obstacles Stepping over obstacles Slowed pace, several episodes of LOB       HOME EXERCISE PROGRAM Access Code: [email or web address] URL: [email or web address] Date: 03/27/2024 Prepared by: [facility]  Program Notes perform standing activiites with supervision for safety   Exercises - Sit to Stand with Arms Crossed  - 1 x daily - 5 x weekly - 2 sets - 10 reps - Mini Squat with Counter Support  - 1 x daily - 5 x weekly - 2 sets - 10 reps - Standing Shoulder Row with Anchored Resistance  - 1 x daily - 5 x weekly - 2 sets - 10 reps - Seated Anti-Rotation Press With Anchored Resistance  - 1 x daily - 5 x weekly - 2 sets - 10 reps - Seated Pelvic Tilts  - 1 x daily - 5 x weekly - 2 sets - 10 reps - Standing Gastroc Stretch at Counter  - 1 x daily - 7 x weekly - 1 sets - 3 reps - 30 sec hold - Standing Hip Abduction with Resistance at Ankles and Counter Support  - 1 x daily - 7 x weekly - 3 sets - 10 reps - Standing Romberg to 1/2 Tandem Stance  - 1 x daily - 5 x weekly - 1 sets - 3 reps - 15 sec hold - Standing Hip Extension with Resistance at Ankles and Counter Support  - 1 x daily - 7 x weekly - 3 sets - 10 reps - Supine Quad Set  - 1  x daily - 7 x weekly - 3 sets - 10 reps - 2 sec hold - Active Straight Leg Raise with Quad Set  - 1 x daily - 7 x weekly - 3 sets - 10 reps - Supine Hip Abduction with Resistance at Ankles  - 1 x daily - 7 x weekly - 3 sets - 10 reps - Hooklying Single Leg Bent Knee Fallouts with Resistance  - 1 x daily - 7 x weekly - 3 sets - 10 reps    PATIENT EDUCATION: Education details:  Reviewed HEP-began discussion of POC-to check LTGs next visit Person educated: Patient and Corporate Treasurer Education method: Explanation and Demonstration Education comprehension: verbalized understanding       Note: Objective measures were completed at Evaluation unless otherwise noted.  DIAGNOSTIC FINDINGS: CT 01/31/24-no abnormality  COGNITION: Overall cognitive status: Within functional limits for tasks assessed   SENSATION: Light touch: Impaired  and diminished sensation RLE  POSTURE: rounded shoulders, forward head, and posterior pelvic tilt In standing, knees crouched shaky in unsupported standing  LOWER EXTREMITY ROM:   pt slumps into posterior pelvic tilt with ROM  Active  Right Eval Left Eval  Hip flexion    Hip extension    Hip abduction    Hip adduction    Hip internal rotation    Hip external rotation    Knee flexion    Knee extension -10 -20  Ankle dorsiflexion 10 15  Ankle plantarflexion    Ankle inversion    Ankle eversion     (Blank rows = not tested)  LOWER EXTREMITY MMT:    MMT Right Eval Left Eval  Hip flexion 4 5  Hip extension    Hip abduction 4 4  Hip adduction 5 5  Hip internal rotation    Hip external rotation    Knee flexion 5 5  Knee extension 4 5  Ankle dorsiflexion 3- 3+  Ankle plantarflexion    Ankle inversion    Ankle eversion    (Blank rows = not tested)  TRANSFERS: Sit to stand: SBA  Assistive device utilized: Environmental Consultant - 2 wheeled     Stand to sit: SBA  Assistive device utilized: Environmental Consultant - 2 wheeled      GAIT: Findings: Gait Characteristics: step through pattern, knee flexed in stance- Right, knee flexed in stance- Left, and wide BOS, Distance walked: 50 ft, Assistive device utilized:Walker - 2 wheeled, and Level of assistance: CGA  FUNCTIONAL TESTS:  5 times sit to stand: 17.28 Timed up and go (TUG): 24.97 sec 10 meter walk test: 18.28 sec (1.79 ft/sec) Berg Balance: 27/56                                                                                                                                     TREATMENT DATE: 02/16/2024    PATIENT EDUCATION: Education details: Eval results, POC Person educated: Patient and Nurse, adult Education  method: Explanation Education comprehension: verbalized understanding  HOME EXERCISE PROGRAM: At home, he is standing at counter:  marching, hip kicks side, knee bends, heel raises  GOALS: Goals reviewed with patient? Yes  SHORT TERM GOALS: Target date: 03/15/2024  Pt will be independent with HEP for improved strength, balance, gait. Baseline: Goal status: MET 03/20/24  2.  Pt will improve 5x sit<>stand to less than or equal to 15 sec to demonstrate improved functional strength and transfer efficiency. Baseline: 17.28 sec; 14.64 sec before cueing 03/20/24  Goal status: MET 03/20/24   3.  Pt will improve Berg score to at least 37/56 to decrease fall risk. Baseline: 27/56; 43/56 03/20/24 Goal status: MET 03/20/24   LONG TERM GOALS: Target date: 04/12/2024  Pt will be independent with HEP for improved strength, balance, gait. Baseline:  Goal status: IN PROGRESS  2.  Pt will improve 5x sit<>stand to less than or equal to 12 sec to demonstrate improved functional strength and transfer efficiency. Baseline: 17.28 sec;  Goal status: IN PROGRESS   3.  Pt will improve Berg score to at least 48/56 to decrease fall risk. Baseline: 27/56 Goal status: IN PROGRESS  4.  Pt will improve TUG score to less than or equal to 13 sec for decreased fall risk. Baseline: 24.97 sec Goal status: IN PROGRESS  5.  Pt will improve gait velocity to at least 2.3 ft/sec for improved gait efficiency and safety. Baseline: 1.79 ft/sec Goal status:IN PROGRESS  6.  Pt will verbalize understanding of fall prevention in home environment.  Baseline:  Goal status: IN PROGRESS  ASSESSMENT:  CLINICAL IMPRESSION: Pt presents today with no new complaints. Skilled PT  session focused on reviewing HEP for quad/hip strengthening-pt return demo with minimal cues.  Worked on dynamic gait and balance, pt has some difficulty with negotiation of obstacles with minor LOB.  He reports increased back pain at end of session (noted that he is quite forward flexed during gait tasked) and back pain lessens with sitting.  No complaints upon leaving.  Pt will continue to benefit from skilled PT towards goals for improved functional mobility and decreased fall risk.  OBJECTIVE IMPAIRMENTS: Abnormal gait, decreased balance, decreased mobility, difficulty walking, decreased strength, impaired flexibility, and postural dysfunction.   ACTIVITY LIMITATIONS: standing, transfers, and locomotion level  PARTICIPATION LIMITATIONS: meal prep, cleaning, laundry, shopping, community activity, and travel  PERSONAL FACTORS: 3+ comorbidities: see above are also affecting patient's functional outcome.   REHAB POTENTIAL: Good  CLINICAL DECISION MAKING: Evolving/moderate complexity  EVALUATION COMPLEXITY: Moderate  PLAN:  PT FREQUENCY: 2x/week  PT DURATION: 8 weeks plus eval  PLANNED INTERVENTIONS: 97750- Physical Performance Testing, 97110-Therapeutic exercises, 97530- Therapeutic activity, 97112- Neuromuscular re-education, 97535- Self Care, [id number]- Manual therapy, [phone]- Gait training, Patient/Family education, and Balance training  PLAN FOR NEXT SESSION: Check LTGs; discuss POC (pt to be out of town over holidays)  Greig Anon, PT 04/08/2024 2:47 PM Phone: [phone] Fax: [phone]  [facility] [address], [address] [place], [place] [address] Phone # [phone] Fax # [phone]

## 2024-04-09 ENCOUNTER — Telehealth: Payer: Self-pay | Admitting: Physical Medicine and Rehabilitation

## 2024-04-09 NOTE — Telephone Encounter (Signed)
 Pt said they have done the x-ray and they wants to know if you've seen it. Also, what do you want to do next?

## 2024-04-09 NOTE — Telephone Encounter (Signed)
 It can take a few days for radiology to read the xray; I will call him once results are posted. Thanks!

## 2024-04-10 ENCOUNTER — Telehealth: Payer: Self-pay | Admitting: Physical Medicine and Rehabilitation

## 2024-04-10 ENCOUNTER — Ambulatory Visit

## 2024-04-10 DIAGNOSIS — M25561 Pain in right knee: Secondary | ICD-10-CM | POA: Insufficient documentation

## 2024-04-10 DIAGNOSIS — R2681 Unsteadiness on feet: Secondary | ICD-10-CM | POA: Diagnosis not present

## 2024-04-10 DIAGNOSIS — M6281 Muscle weakness (generalized): Secondary | ICD-10-CM

## 2024-04-10 DIAGNOSIS — R2689 Other abnormalities of gait and mobility: Secondary | ICD-10-CM

## 2024-04-10 DIAGNOSIS — M11261 Other chondrocalcinosis, right knee: Secondary | ICD-10-CM | POA: Insufficient documentation

## 2024-04-10 NOTE — Therapy (Signed)
 OUTPATIENT PHYSICAL THERAPY NEURO TREATMENT and D/C Summary   Patient Name: Miguel Williams MRN: [id number] DOB:May 23, 1945, 78 y.o., male Today's Date: 04/10/2024   PCP: Shayne Anes, MD REFERRING PROVIDER: Emeline Joesph BROCKS, DO   PHYSICAL THERAPY DISCHARGE SUMMARY  Visits from Start of Care: 14  Current functional level related to goals / functional outcomes: See LTG section for outcome measures   Remaining deficits: Hip abduction weakness R > L with resulting Trendelenberg gait deviation   Education / Equipment: HEP, recommend quad cane for ambulation indoors and rollator for outdoors/distance   Patient agrees to discharge. Patient goals were met. Patient is being discharged due to being pleased with the current functional level.    END OF SESSION:  PT End of Session - 04/10/24 1406     Visit Number 14    Number of Visits 17    Date for Recertification  04/12/24    Authorization Type [employer]    PT Start Time 1405    PT Stop Time 1445    PT Time Calculation (min) 40 min    Equipment Utilized During Treatment Gait belt    Activity Tolerance Patient tolerated treatment well    Behavior During Therapy WFL for tasks assessed/performed                 Past Medical History:  Diagnosis Date   Abdominal pain, lower    Agitation    Allergy, unspecified, initial encounter    Anxiety disorder    Arthritis    Asthma    As a child   Bipolar disorder (HCC)    Patient denies   Cancer (HCC)    Bladder   Cancer (HCC)    Skin cancer   Chronic kidney disease, stage III (moderate) (HCC)    Chronic kidney disease, stage III (moderate) (HCC)    COPD (chronic obstructive pulmonary disease) (HCC)    Pt denies (10/2023)   Coronary atherosclerosis of native coronary artery    Esophageal reflux    Fatigue    Fungal granuloma    Hip pain    History of 2019 novel coronavirus disease (COVID-19)    History of endoscopy 02/00/2011   History of kidney stones     Hypertension    Hypo-osmolality and hyponatremia    Hypokalemia    Hypothyroidism    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 now has 3oz of wine every other day (as of 10/2023)   Umbilical hernia    Unspecified fall, subsequent encounter    Vitamin D deficiency, unspecified    Wernicke's encephalopathy 06/23/2019   Past Surgical History:  Procedure Laterality Date   CATARACT EXTRACTION W/ INTRAOCULAR LENS IMPLANT Bilateral    COLONOSCOPY  04/2023   COLONOSCOPY W/ ENDOSCOPIC US      CYSTOSCOPY WITH FULGERATION N/A 03/09/2023   Procedure: CYSTOSCOPY WITH FULGERATION WITH CLOT EVACUATION;  Surgeon: Devere Lonni Righter, MD;  Location: [facility];  Service: Urology;  Laterality: N/A;   CYSTOSCOPY WITH URETHRAL DILATATION N/A 03/08/2023   Procedure: CYSTOSCOPY;  Surgeon: Devere Lonni Righter, MD;  Location: [facility];  Service: Urology;  Laterality: N/A;  30 MINUTES   ESOPHAGOGASTRODUODENOSCOPY     LUMBAR WOUND DEBRIDEMENT N/A 12/01/2023   Procedure: LUMBAR WOUND DEBRIDEMENT;  Surgeon: [name], MD;  Location: [facility];  Service: Neurosurgery;  Laterality: N/A;   TONSILLECTOMY  04/25/1950   Patient Active Problem List   Diagnosis Date Noted   Arthritis of right knee 04/03/2024   Popliteus tendinitis of right lower extremity 04/03/2024   Right cervical radiculopathy 02/05/2024   Constipation 12/20/2023   Acute on chronic back pain 12/20/2023   Neuropathy 12/20/2023   Depression with anxiety  12/08/2023   Neurogenic claudication due to lumbar spinal stenosis 12/05/2023   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   Lumbar epidural mass (HCC) 12/01/2023   Spinal stenosis of lumbar region 11/15/2023   Family history of alcoholism 11/06/2023   Headache, occipital 11/06/2023   History of pancreatitis 11/06/2023   Hyperparathyroidism 11/06/2023   Hypertension 11/06/2023   Joint swelling 11/06/2023   Joint stiffness 11/06/2023   Laryngopharyngeal reflux 11/06/2023   Lumbar polyradiculopathy 11/06/2023   Memory loss 11/06/2023   Anemia, blood loss 11/06/2023   Symptomatic anemia 08/10/2023   Anemia 08/09/2023   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 11/27/2022   Urinary tract infection without hematuria 11/09/2022   Bladder tumor 09/12/2022   Nausea and vomiting 09/12/2022   Kidney stones 12/11/2021   Scalp laceration 07/01/2021   Wernicke's encephalopathy 06/23/2019   Chronic kidney disease, stage III (moderate) (HCC)    Hyperkalemia 04/12/2019   Chronic kidney disease due to hypertension 04/12/2019   Stage 3a chronic kidney disease (HCC) 04/12/2019   Alcohol  use, unspecified with unspecified alcohol -induced disorder 04/12/2019   Pyelonephritis 04/05/2019   Renal failure (ARF), acute on chronic 04/05/2019   Ureterolithiasis 04/05/2019   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   Presbycusis of both ears 05/27/2016   Hardening of the aorta (main artery of the heart) 02/15/2016   Neck pain 01/15/2016   Tremor 12/18/2015   Hearing loss of left ear 06/04/2015   Other specified diseases of blood and blood-forming organs 07/28/2014   Long term current use of therapeutic drug 09/09/2013   Hyperlipidemia 04/03/2013   Hemorrhoids 02/18/2013   History of infectious disease 02/18/2013   Gout 02/18/2013   Pain, dental 01/01/2013   Health maintenance examination 03/30/2011   Abnormal prostate exam 03/08/2011   Abnormal prostate specific antigen 03/08/2011   GASTROESOPHAGEAL  REFLUX DISEASE, CHRONIC 12/24/2008   ACUTE BRONCHITIS 12/16/2008   Asthma with exacerbation 12/16/2008   GANGLION CYST, TENDON SHEATH 06/27/2008   FOOT PAIN, LEFT 05/23/2008   ADVERSE DRUG REACTION 03/10/2008   GOUT 03/08/2007   BPH/LUTS W/O OBSTRUCTION 03/08/2007   ELEVATED BP W/O HYPERTENSION 03/08/2007   INSOMNIA, CHRONIC, MILD 10/02/2006   CERUMEN IMPACTION, BILATERAL 10/02/2006    ONSET DATE: 11/15/2023  REFERRING DIAG: F51.937 (ICD-10-CM) - Neurogenic claudication due to lumbar spinal stenosis   THERAPY DIAG:  Muscle weakness (generalized)  Unsteadiness on feet  Other abnormalities of gait and mobility  Rationale for Evaluation and Treatment: Rehabilitation  SUBJECTIVE:  SUBJECTIVE STATEMENT: Doing ok, right knee is feeling some better  Pt accompanied by: NurseGLENWOOD Ivanoff  PERTINENT HISTORY: presented 11/30/23 with low back pain and weakness along with several falls since his elective PLIF L4-S1 with decompressive laminectomies on 11/15/23. Workup has revealed large what looks like epidural hematoma and wound hematoma. S/p reexploration of lumbar wound for evacuation of hematoma 8/8. PMH of anxiety, cancer, CKD III, HLD, HTN, hypothyroidism, neuropathy   PAIN:  Are you having pain? Yes: NPRS scale: 3-4/10 Pain location: LB, R knee Pain description: sore Aggravating factors: not leaning on walker, walking Relieving factors: sitting down  PRECAUTIONS: Back and Fall  RED FLAGS: None   WEIGHT BEARING RESTRICTIONS: No  FALLS: Has patient fallen in last 6 months? Yes. Number of falls 5 *Last two falls were without walker LIVING ENVIRONMENT: Lives with: lives alone and has a nurse who is staying in home 24/7 Lives in: Other townhouse Stairs: 2 steps to enter with rails on B sides that he  can reach, 2 story home but is able to remain on main floor with full bedroom/bathroom  Has following equipment at home: Single point cane, Walker - 2 wheeled, shower chair, bed side commode, Grab bars, and hand held shower head, transport wheelchair   PLOF: Independent and Independent with basic ADLs  PATIENT GOALS: Balance and walking  OBJECTIVE:   TODAY'S TREATMENT: 04/10/24 Activity Comments  Seated LE AROM 20x For warm-up  POC performance and review                  [facility] PT Assessment - 04/10/24 0001       Berg Balance Test   Sit to Stand Able to stand without using hands and stabilize independently    Standing Unsupported Able to stand safely 2 minutes    Sitting with Back Unsupported but Feet Supported on Floor or Stool Able to sit safely and securely 2 minutes    Stand to Sit Sits safely with minimal use of hands    Transfers Able to transfer safely, minor use of hands    Standing Unsupported with Eyes Closed Able to stand 10 seconds safely    Standing Unsupported with Feet Together Able to place feet together independently and stand 1 minute safely    From Standing, Reach Forward with Outstretched Arm Can reach confidently >25 cm (10)    From Standing Position, Pick up Object from Floor Able to pick up shoe safely and easily    From Standing Position, Turn to Look Behind Over each Shoulder Looks behind from both sides and weight shifts well    Turn 360 Degrees Able to turn 360 degrees safely but slowly    Standing Unsupported, Alternately Place Feet on Step/Stool Able to stand independently and safely and complete 8 steps in 20 seconds    Standing Unsupported, One Foot in Front Able to place foot tandem independently and hold 30 seconds    Standing on One Leg Tries to lift leg/unable to hold 3 seconds but remains standing independently    Total Score 51           TODAY'S TREATMENT: 04/08/2024 Activity Comments  Review of HEP: Supine quad sets x 10, 3  RLE SLR  x 10 Hip abduct green band Clamshell green band Good return demo-min cues for technique; no pain  Mini-squat mid-range iso holds, 2 x 5 reps from mat, from chair Cues for hip ext rotation, 5 sec and cues for control  Sidestepping in parallel  bars Cues for RLE foot clearance  Forward/back walking in parallel bars Cues for step length  Negotiating around obstacles Stepping over obstacles Slowed pace, several episodes of LOB       HOME EXERCISE PROGRAM Access Code: [email or web address] URL: [email or web address] Date: 03/27/2024 Prepared by: [facility] - Outpatient  Rehab - [facility]  Program Notes perform standing activiites with supervision for safety   Exercises - Sit to Stand with Arms Crossed  - 1 x daily - 5 x weekly - 2 sets - 10 reps - Mini Squat with Counter Support  - 1 x daily - 5 x weekly - 2 sets - 10 reps - Standing Shoulder Row with Anchored Resistance  - 1 x daily - 5 x weekly - 2 sets - 10 reps - Seated Anti-Rotation Press With Anchored Resistance  - 1 x daily - 5 x weekly - 2 sets - 10 reps - Seated Pelvic Tilts  - 1 x daily - 5 x weekly - 2 sets - 10 reps - Standing Gastroc Stretch at Counter  - 1 x daily - 7 x weekly - 1 sets - 3 reps - 30 sec hold - Standing Hip Abduction with Resistance at Ankles and Counter Support  - 1 x daily - 7 x weekly - 3 sets - 10 reps - Standing Romberg to 1/2 Tandem Stance  - 1 x daily - 5 x weekly - 1 sets - 3 reps - 15 sec hold - Standing Hip Extension with Resistance at Ankles and Counter Support  - 1 x daily - 7 x weekly - 3 sets - 10 reps - Supine Quad Set  - 1 x daily - 7 x weekly - 3 sets - 10 reps - 2 sec hold - Active Straight Leg Raise with Quad Set  - 1 x daily - 7 x weekly - 3 sets - 10 reps - Supine Hip Abduction with Resistance at Ankles  - 1 x daily - 7 x weekly - 3 sets - 10 reps - Hooklying Single Leg Bent Knee Fallouts with Resistance  - 1 x daily - 7 x weekly - 3 sets - 10 reps    PATIENT  EDUCATION: Education details: Reviewed HEP-began discussion of POC-to check LTGs next visit Person educated: Patient and Corporate Treasurer Education method: Explanation and Demonstration Education comprehension: verbalized understanding       Note: Objective measures were completed at Evaluation unless otherwise noted.  DIAGNOSTIC FINDINGS: CT 01/31/24-no abnormality  COGNITION: Overall cognitive status: Within functional limits for tasks assessed   SENSATION: Light touch: Impaired  and diminished sensation RLE  POSTURE: rounded shoulders, forward head, and posterior pelvic tilt In standing, knees crouched shaky in unsupported standing  LOWER EXTREMITY ROM:   pt slumps into posterior pelvic tilt with ROM  Active  Right Eval Left Eval  Hip flexion    Hip extension    Hip abduction    Hip adduction    Hip internal rotation    Hip external rotation    Knee flexion    Knee extension -10 -20  Ankle dorsiflexion 10 15  Ankle plantarflexion    Ankle inversion    Ankle eversion     (Blank rows = not tested)  LOWER EXTREMITY MMT:    MMT Right Eval Left Eval  Hip flexion 4 5  Hip extension    Hip abduction 4 4  Hip adduction 5 5  Hip internal rotation    Hip external rotation  Knee flexion 5 5  Knee extension 4 5  Ankle dorsiflexion 3- 3+  Ankle plantarflexion    Ankle inversion    Ankle eversion    (Blank rows = not tested)  TRANSFERS: Sit to stand: SBA  Assistive device utilized: Environmental Consultant - 2 wheeled     Stand to sit: SBA  Assistive device utilized: Environmental Consultant - 2 wheeled      GAIT: Findings: Gait Characteristics: step through pattern, knee flexed in stance- Right, knee flexed in stance- Left, and wide BOS, Distance walked: 50 ft, Assistive device utilized:Walker - 2 wheeled, and Level of assistance: CGA  FUNCTIONAL TESTS:  5 times sit to stand: 17.28 Timed up and go (TUG): 24.97 sec 10 meter walk test: 18.28 sec (1.79 ft/sec) Berg Balance: 27/56    [facility] PT  Assessment - 04/10/24 0001       Berg Balance Test   Sit to Stand Able to stand without using hands and stabilize independently    Standing Unsupported Able to stand safely 2 minutes    Sitting with Back Unsupported but Feet Supported on Floor or Stool Able to sit safely and securely 2 minutes    Stand to Sit Sits safely with minimal use of hands    Transfers Able to transfer safely, minor use of hands    Standing Unsupported with Eyes Closed Able to stand 10 seconds safely    Standing Unsupported with Feet Together Able to place feet together independently and stand 1 minute safely    From Standing, Reach Forward with Outstretched Arm Can reach confidently >25 cm (10)    From Standing Position, Pick up Object from Floor Able to pick up shoe safely and easily    From Standing Position, Turn to Look Behind Over each Shoulder Looks behind from both sides and weight shifts well    Turn 360 Degrees Able to turn 360 degrees safely but slowly    Standing Unsupported, Alternately Place Feet on Step/Stool Able to stand independently and safely and complete 8 steps in 20 seconds    Standing Unsupported, One Foot in Front Able to place foot tandem independently and hold 30 seconds    Standing on One Leg Tries to lift leg/unable to hold 3 seconds but remains standing independently    Total Score 51                                                                                                                                         TREATMENT DATE: 02/16/2024    PATIENT EDUCATION: Education details: Eval results, POC Person educated: Patient and Nurse, adult Education method: Explanation Education comprehension: verbalized understanding  HOME EXERCISE PROGRAM: At home, he is standing at counter:  marching, hip kicks side, knee bends, heel raises  GOALS: Goals reviewed with patient? Yes  SHORT TERM GOALS: Target date: 03/15/2024  Pt will be independent with HEP for improved  strength, balance, gait. Baseline: Goal status: MET 03/20/24  2.  Pt will improve 5x sit<>stand to less than or equal to 15 sec to demonstrate improved functional strength and transfer efficiency. Baseline: 17.28 sec; 14.64 sec before cueing 03/20/24  Goal status: MET 03/20/24   3.  Pt will improve Berg score to at least 37/56 to decrease fall risk. Baseline: 27/56; 43/56 03/20/24 Goal status: MET 03/20/24   LONG TERM GOALS: Target date: 04/12/2024  Pt will be independent with HEP for improved strength, balance, gait. Baseline:  Goal status: MET  2.  Pt will improve 5x sit<>stand to less than or equal to 12 sec to demonstrate improved functional strength and transfer efficiency. Baseline: 17.28 sec; 9 sec Goal status: MET  3.  Pt will improve Berg score to at least 48/56 to decrease fall risk. Baseline: 27/56; 51/56 Goal status: MET  4.  Pt will improve TUG score to less than or equal to 13 sec for decreased fall risk. Baseline: 24.97 sec; 9 sec w/ quad cane Goal status: MET  5.  Pt will improve gait velocity to at least 2.3 ft/sec for improved gait efficiency and safety. Baseline: 1.79 ft/sec; 2.73 ft/sec Goal status: MET  6.  Pt will verbalize understanding of fall prevention in home environment.  Baseline:  Goal status: MET  ASSESSMENT:  CLINICAL IMPRESSION: Demo marked improvement in functional mobility performing at modified independent level and low risk for falls per time 5xSTS test, TUG test, and Berg Balance Test and significantly improve gait speed from initial 1.79 ft/sec w/ RW to 2.73 ft/sec w/ quad cane.  Discussed activity pacing, energy conservation, and safety awareness strategies with good teachback to strategies.  Pt reports some lingering right knee pain but abated with use of cane and when consciously activating right quad. Pt confident in self-mgmt at this time and intends to D/C to HEP OBJECTIVE IMPAIRMENTS: Abnormal gait, decreased balance, decreased  mobility, difficulty walking, decreased strength, impaired flexibility, and postural dysfunction.   ACTIVITY LIMITATIONS: standing, transfers, and locomotion level  PARTICIPATION LIMITATIONS: meal prep, cleaning, laundry, shopping, community activity, and travel  PERSONAL FACTORS: 3+ comorbidities: see above are also affecting patient's functional outcome.   REHAB POTENTIAL: Good  CLINICAL DECISION MAKING: Evolving/moderate complexity  EVALUATION COMPLEXITY: Moderate  PLAN:  PT FREQUENCY: 2x/week  PT DURATION: 8 weeks plus eval  PLANNED INTERVENTIONS: 97750- Physical Performance Testing, 97110-Therapeutic exercises, 97530- Therapeutic activity, [id number]- Neuromuscular re-education, 97535- Self Care, [id number]- Manual therapy, [phone]- Gait training, Patient/Family education, and Balance training  PLAN FOR NEXT SESSION: D/C to HEP  2:46 PM, 04/10/2024 M. Kelly Teige Rountree, PT, DPT Physical Therapist- [place] Office Number: [phone]

## 2024-04-10 NOTE — Telephone Encounter (Signed)
 Pt ask if you've review the xray, and from the result if he can get the injection. He said he will be out of town by the weekend.

## 2024-05-22 NOTE — Progress Notes (Unsigned)
 "  Assessment/Plan:   Assessment and Plan Assessment & Plan Cervical radiculopathy and spondylosis Chronic cervical radiculopathy secondary to degenerative changes and arthritis. Can follow-up with Dr. [name] if symptoms arise   Gait instability             - Patient does not meet criteria for Parkinson's and does not have Parkinson's disease.  - DaTscan  normal   Cervical spondylosis             - If having neck pain, he can follow-up with the neurosurgeon who did his recent back surgery   History of Wernicke's encephalopathy             - Recommend complete cessation of alcohol              - I do think that it is possible that he has peripheral neuropathy, based on physical examination today.  This could be due to his history of alcohol  use, even if he is not drinking much today.  Lumbar radiculopathy  -s/p fusion L4-5 and L5/S1 July 2025 and readmitted not long thereafter with falls/bilateral LE weakness and found time have large 6.8 cm posterior paraspinal fluid collection.  He subsequently underwent exploration at that wound with evacuation of old blood/seroma on August 8.  Patient continues with chronic pain and is getting ready to start dry needling.  Follow-up as needed    Subjective:   Discussed the use of AI scribe software for clinical note transcription with the patient, who gave verbal consent to proceed.  History of Present Illness Traquan Duarte is a 79 year old male with postlaminectomy syndrome, cervical radiculopathy, and bladder cancer presenting for follow-up of prior testing and exam/rule out Parkinson's  Zevin Nevares was seen today in follow up.  The patient initially saw me for consultation for Parkinson's.  When I saw him last visit, he did not meet criteria for Parkinson's.  He did drag the left leg, but he was getting ready to have back surgery not long after I saw him, and we discussed that the dragging of the leg could be from severe spinal stenosis.   Patient had surgery and is now s/p fusion L4-5 and L5/S1 July 2025 and readmitted not long thereafter with falls/bilateral LE weakness.  Found to have large 6.8 cm posterior paraspinal fluid collection.  He subsequently underwent exploration at that wound with evacuation of old blood/seroma on August 8.  He now notes that he has chronic pain.  He is getting ready to have dry needling.  Separately, from my standpoint he he ended up having a DaTscan  which was normal.  He had an MRI of the cervical spine after our last visit demonstrating severe cervical spondylosis, but no severe spinal stenosis.  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  We did tell him that while this could cause neck and arm pain, it would not cause gait instability and falls.  We told him he could follow-up with the neurosurgeon who did his back surgery (Dr. [name]).  Patient was in the emergency room October 8 after a fall.  He did hit his head.  He slipped when walking and ended up hitting his head.  Emergency workup was negative and he was released from the emergency room.  He has been in physical therapy since last visit, but that was primarily for post back surgery.  He is following with Dr. Joesph Likes.  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    CURRENT MEDICATIONS:  Outpatient Encounter Medications as of 05/23/2024  Medication Sig   acetaminophen  (TYLENOL ) 500 MG tablet Take 2 tablets (1,000 mg total) by mouth in the morning, at noon, and at bedtime.   albuterol (VENTOLIN HFA) 108 (90 Base) MCG/ACT inhaler Inhale into the lungs every 6 (six) hours as needed for wheezing or shortness of breath.   artificial tears ophthalmic solution Place 1 drop into both eyes 4 (four) times daily as needed for dry eyes.   atorvastatin  (LIPITOR) 40 MG tablet Take 40 mg by mouth in the morning.   betamethasone dipropionate 0.05 % cream Apply 1  Application topically 2 (two) times daily as needed (skin irritation.).   calcium -vitamin D (OSCAL WITH D) 500-5 MG-MCG tablet Take 1 tablet by mouth.   cetaphil (CETAPHIL) lotion Apply topically as needed for dry skin.   clonazePAM  (KLONOPIN ) 0.5 MG tablet Take 0.5 mg by mouth at bedtime as needed (sleep/anxiety).   colchicine  0.6 MG tablet Take 0.6 mg by mouth in the morning.   cyanocobalamin  (VITAMIN B12) 500 MCG tablet Take 500 mcg by mouth daily.   DULoxetine  (CYMBALTA ) 60 MG capsule TAKE 1 CAPSULE BY MOUTH 2 TIMES DAILY.   dutasteride  (AVODART ) 0.5 MG capsule Take 1 capsule (0.5 mg total) by mouth every other day.   ezetimibe  (ZETIA ) 10 MG tablet Take 10 mg by mouth in the morning.   ferrous sulfate  325 (65 FE) MG EC tablet Take 325 mg by mouth 3 (three) times daily with meals.   gabapentin  (NEURONTIN ) 100 MG capsule Take 1 capsule (100 mg total) by mouth at bedtime as needed.   Inulin  (FIBER CHOICE PO) Take 2 capsules by mouth daily.   lansoprazole (PREVACID) 30 MG capsule Take 30 mg by mouth in the morning and at bedtime.   levothyroxine  (SYNTHROID ) 50 MCG tablet Take 50-100 mcg by mouth See admin instructions. Take 2 tablets (100 mcg) by mouth in the morning on Sundays.  Take 1 tablet (50 mcg) by mouth in the morning on Mondays, Tuesdays, Wednesdays, Thursdays, Fridays & Saturdays.   loperamide  (IMODIUM  A-D) 2 MG tablet Take 2 mg by mouth 4 (four) times daily as needed for diarrhea or loose stools.   melatonin 5 MG TABS Take 1 tablet (5 mg total) by mouth at bedtime as needed.   SYSTANE COMPLETE PF 0.6 % SOLN Place 1 drop into both eyes 4 (four) times daily as needed (for dryness).   Tamsulosin  HCl (FLOMAX ) 0.4 MG CAPS TAKE 1 CAPSULE (0.4 MG TOTAL) BY MOUTH DAILY.   thiamine  (VITAMIN B-1) 100 MG tablet Take 1 tablet (100 mg total) by mouth daily.   valsartan (DIOVAN) 80 MG tablet Take 80 mg by mouth at bedtime.   VITAMIN D, CHOLECALCIFEROL , PO Take 1 capsule by mouth daily.    [DISCONTINUED] polyethylene glycol powder (GLYCOLAX /MIRALAX ) 17 GM/SCOOP powder Mix 1 capful (17 g) with water  and drink by mouth daily.   [DISCONTINUED] senna (SENOKOT) 8.6 MG TABS tablet Take 2 tablets (17.2 mg total) by mouth at bedtime.   No facility-administered encounter medications on file as of 05/23/2024.     Objective:   PHYSICAL EXAMINATION:    VITALS:   Vitals:   05/23/24 1245  BP: 120/76  SpO2: 97%  Weight: 206 lb 6.4 oz (93.6 kg)    GEN:  The patient appears stated age and is in NAD. HEENT:  Normocephalic, atraumatic.  The mucous membranes are moist. The superficial temporal arteries are  without ropiness or tenderness. CV:  RRR Lungs:  CTAB Neck/HEME:  There are no carotid bruits bilaterally.  Neurological examination:  Orientation: The patient is alert and oriented x3. Cranial nerves: There is good facial symmetry.The speech is fluent and clear. Soft palate rises symmetrically and there is no tongue deviation. Hearing is intact to conversational tone. Sensation: Sensation is intact to light touch throughout Motor: Strength is 5/5 in the bilateral upper and lower extremities.  Movement examination: Tone: There is nl tone in the bilateral upper extremities.  The tone in the lower extremities is good.  Abnormal movements: none even with distraction Coordination:  There is  decremation with RAM's, only with finger taps on the right  Gait and Station: The patient pushes off to arise.  He is just a bit antalgic, but otherwise ambulates well in the office.  He is a bit wide-based.     Cc:  Shayne Anes, MD  "

## 2024-05-23 ENCOUNTER — Ambulatory Visit: Admitting: Neurology

## 2024-05-23 VITALS — BP 120/76 | Wt 206.4 lb

## 2024-05-23 DIAGNOSIS — M5412 Radiculopathy, cervical region: Secondary | ICD-10-CM

## 2024-05-23 DIAGNOSIS — M5416 Radiculopathy, lumbar region: Secondary | ICD-10-CM | POA: Diagnosis not present

## 2024-05-23 DIAGNOSIS — R2681 Unsteadiness on feet: Secondary | ICD-10-CM

## 2024-06-05 ENCOUNTER — Encounter: Admitting: Physical Medicine and Rehabilitation
